# Patient Record
Sex: Female | Born: 1940 | Race: White | Hispanic: No
Health system: Midwestern US, Community
[De-identification: ages and names within clinical notes are randomized; demographics above are authoritative.]

## PROBLEM LIST (undated history)

## (undated) DIAGNOSIS — K219 Gastro-esophageal reflux disease without esophagitis: Secondary | ICD-10-CM

## (undated) DIAGNOSIS — G629 Polyneuropathy, unspecified: Secondary | ICD-10-CM

## (undated) DIAGNOSIS — I10 Essential (primary) hypertension: Secondary | ICD-10-CM

## (undated) DIAGNOSIS — M48061 Spinal stenosis, lumbar region without neurogenic claudication: Secondary | ICD-10-CM

## (undated) DIAGNOSIS — Z8719 Personal history of other diseases of the digestive system: Secondary | ICD-10-CM

## (undated) DIAGNOSIS — E039 Hypothyroidism, unspecified: Secondary | ICD-10-CM

## (undated) DIAGNOSIS — M5416 Radiculopathy, lumbar region: Secondary | ICD-10-CM

## (undated) DIAGNOSIS — F419 Anxiety disorder, unspecified: Secondary | ICD-10-CM

## (undated) DIAGNOSIS — E119 Type 2 diabetes mellitus without complications: Secondary | ICD-10-CM

## (undated) HISTORY — PX: ABDOMINAL HYSTERECTOMY: SHX81

## (undated) HISTORY — PX: LEFT OOPHORECTOMY: SHX1961

## (undated) HISTORY — PX: CHOLECYSTECTOMY: SHX55

## (undated) HISTORY — PX: TONSILLECTOMY: SUR1361

## (undated) HISTORY — PX: BUNIONECTOMY: SHX129

## (undated) HISTORY — DX: Type 2 diabetes mellitus without complications: E11.9

## (undated) HISTORY — PX: BREAST SURGERY: SHX581

## (undated) HISTORY — PX: BACK SURGERY: SHX140

---

## 1999-12-09 DIAGNOSIS — G4733 Obstructive sleep apnea (adult) (pediatric): Secondary | ICD-10-CM | POA: Insufficient documentation

## 1999-12-09 DIAGNOSIS — N189 Chronic kidney disease, unspecified: Secondary | ICD-10-CM | POA: Insufficient documentation

## 1999-12-09 DIAGNOSIS — K760 Fatty (change of) liver, not elsewhere classified: Secondary | ICD-10-CM | POA: Insufficient documentation

## 1999-12-09 DIAGNOSIS — E039 Hypothyroidism, unspecified: Secondary | ICD-10-CM | POA: Insufficient documentation

## 1999-12-09 DIAGNOSIS — E8881 Metabolic syndrome: Secondary | ICD-10-CM | POA: Insufficient documentation

## 2000-10-23 ENCOUNTER — Ambulatory Visit (HOSPITAL_COMMUNITY): Admission: RE | Admit: 2000-10-23 | Discharge: 2000-10-23 | Payer: Self-pay | Admitting: Neurosurgery

## 2000-11-03 ENCOUNTER — Inpatient Hospital Stay (HOSPITAL_COMMUNITY): Admission: RE | Admit: 2000-11-03 | Discharge: 2000-11-05 | Payer: Self-pay | Admitting: Neurosurgery

## 2005-12-10 ENCOUNTER — Inpatient Hospital Stay (HOSPITAL_COMMUNITY): Admission: RE | Admit: 2005-12-10 | Discharge: 2005-12-13 | Payer: Self-pay | Admitting: Neurosurgery

## 2008-03-15 ENCOUNTER — Encounter: Admission: RE | Admit: 2008-03-15 | Discharge: 2008-03-15 | Payer: Self-pay | Admitting: Family Medicine

## 2008-03-31 ENCOUNTER — Encounter: Admission: RE | Admit: 2008-03-31 | Discharge: 2008-03-31 | Payer: Self-pay | Admitting: Family Medicine

## 2010-09-14 ENCOUNTER — Emergency Department (HOSPITAL_BASED_OUTPATIENT_CLINIC_OR_DEPARTMENT_OTHER): Admission: EM | Admit: 2010-09-14 | Discharge: 2010-09-14 | Payer: Self-pay | Admitting: Emergency Medicine

## 2011-02-20 LAB — URINE MICROSCOPIC-ADD ON

## 2011-02-20 LAB — URINALYSIS, ROUTINE W REFLEX MICROSCOPIC
Bilirubin Urine: NEGATIVE
Glucose, UA: NEGATIVE mg/dL
Ketones, ur: NEGATIVE mg/dL
pH: 6 (ref 5.0–8.0)

## 2011-02-20 LAB — URINE CULTURE: Culture  Setup Time: 201110082219

## 2011-04-25 NOTE — Op Note (Signed)
[place]  Patient:    Kathryn Ryan, Kathryn Ryan                      MRN: [id number] Proc. Date: 11/02/00 Adm. Date:  [id number] Attending:  Tressie Stalker D                           Operative Report  BRIEF HISTORY:  The patient is a 70 year old white female who suffered from several years of back and leg pain.  She has failed extensive medical management including medications at the time, therapy, and steroid injections. She was worked up as an outpatient with a lumbar MI and a lumbar ______ CT which demonstrated lumbar spinal stenosis at L3-L4, L4-5 and L5-S1.  She therefore weighed the risks, benefits, and alternatives of surgery and has decided to proceed with lumbar decompression:  PREOPERATIVE DIAGNOSES:  L3-4, L4-5 and L5-S1 spinal stenosis, degenerative disk disease, spondylosis, lumbar radiculopathy.  POSTOPERATIVE DIAGNOSES:  L3-4, L4-5 and L5-S1 spinal stenosis, degenerative disk disease, spondylosis, lumbar radiculopathy.  PROCEDURE:  Bilateral L3, L5 and L5 laminotomy, foraminotomy, using microdissection.  SURGEON:  Cristi Loron, M.D.  ASSISTANT:  Danae Orleans. Venetia Maxon, M.D.  ANESTHESIA:  General endotracheal anesthesia.  ESTIMATED BLOOD LOSS:  200 cc.  SPECIMENS:  None.  DRAINS:  None.  COMPLICATIONS:  None.  DESCRIPTION OF PROCEDURE:  The patient was brought to the operating room by the anesthesia team.  General endotracheal anesthesia was induced. She was then turned to the prone position on the Sussex frame.  her lumbosacral region was then prepared with Betadine scrub and Betadine solution. Sterile drapes were applied.  I then injected the area to be incised with Marcaine with epinephrine solution and used a scalpel to make a midline vertical incision over the L3-4, L4-5 and L5-S1 interspaces.  I used electrocautery to dissect down to the thoracolumbar fascia and divided the fascia bilaterally performing a bilateral  subperiosteal resection, stripping the paraspinous musculature from the spinous process lamina of L3-4, L5 and the upper sacrum.  I inserted a mccullough retractor for exposure.  I then obtained intraoperative radiograph to confirm my location.  I then brought the operating microscope into the field and under magnification and illumination I completed the microdissection/decompression.  I began on the patients left side.  I performed a left L3-4 and L5 laminotomy using the high-speed drill.  I then widened the laminotomy at L3 and L5 with a Kerrison punch removing the L3-4 and L5-S1 ligament of flavum, identified the L4 nerve root, performed a foraminotomy about it as well as the S1 nerve root, performing a foraminotomy about it.  I then performed a left L4 hemi- laminectomy with a Kerrison punch decompressing the thecal sac, removing the L4-5 ligament of flavum and identifying the L5 nerve root, performed a foraminotomy about it.  I used the angled curets to remove some more ligament of flavum from the lateral release.  I got a good decompression of the left L4-5 and S1 nerve root in the thecal sac.  I palpated about these nerves with a long nerve hook and noted they were well decompressed.  I then turned my attention to the right side.  I repeated the procedure on the right.  I used the high-speed drill to perform a right L3-L4 and L5 laminotomy.  I widened the laminotomy at L3 and L5 with a crescent punch removing the L3-4 and L5-S1  ligament of flavum on the right.  I decompressed the thecal sac and identified the right L4 and S1 nerve root and performed a foraminotomy about it.  I then completed the L4 laminectomy with the crescent punch removing the L4-5 ligamentum of flavum on the right.  I performed a complete L4 laminectomy.  I then identified the right L5 nerve root and performed a foraminotomy about it, then used the angled curets to remove some ligamentum of flavum from ______  recess and then palpated about the right L4-5 and S1 nerve root in the thecal sac and noted they were well decompressed.  Of note, at L5-S1 on the right there was a small needle hole in the posterior dura.  This is where the patient had the prior myelogram.  It was a quite small needle hole.  There was leakage of spinal fluid but it was too small to sew.  I laid a couple of pieces of blood soaked Gelfoam over the hole and it provided a good seal.  I then copiously irrigated the wound with bacitracin solution, removed the solution.  I once again palpated about the bilateral L4-5-S1 nerve roots in the thecal sac and noted that the neural structures were well decompressed.  I did not see any more spinal fluid leaking.  I then removed the McCullough retractor and reapproximated the patients thoracolumbar fascia with interrupted #1 Vicryl, the subcutaneous tissue with interrupted 2-0 Vicryl and the skin with Steri-Strips and benzoin.  The wound was then coated with bacitracin ointment and sterile dressings applied.  The drapes were removed and the patient was subsequently returned to the supine position where she was extubated by the anesthesia team and transported to the post anesthesia care unit in stable condition.  All sponge, instrument, and needle counts were correct at the end of this case. DD:  11/02/00 TD:  11/02/00 Job: [id number]

## 2011-04-25 NOTE — Op Note (Signed)
NAME:  Kathryn Ryan, Kathryn Ryan NO.:  [id number]   MEDICAL RECORD NO.:  [id number]          PATIENT TYPE:  INP   LOCATION:  3039                         FACILITY:  [facility]   PHYSICIAN:  Cristi Loron, M.D.DATE OF BIRTH:  01-28-1941   DATE OF PROCEDURE:  12/10/2005  DATE OF DISCHARGE:                                 OPERATIVE REPORT   BRIEF HISTORY:  The patient is a 70 year old white female who has suffered  from back greater than leg pain.  She has previously undergone a  decompressive laminectomy by me several years ago.  She initially did well,  but more recently, has developed recurrent intractable back and leg pain.  She was diagnosed with a neuropathy and was further worked up with a lumbar  MRI which demonstrated a spondylolisthesis at L4-L5.  Flexion extension x-  rays demonstrated instability at L4-L5.  I discussed the various treatments  with the patient including surgery.  The patient weighed the risks,  benefits, and alternatives of surgery and decided to proceed with an L4-L5  decompression and fusion.   PREOPERATIVE DIAGNOSIS:  L4-L5 degenerative disc disease, spondylolisthesis,  spinal stenosis, lumbar radiculopathy, lumbago.   POSTOPERATIVE DIAGNOSIS:  L4-L5 degenerative disc disease,  spondylolisthesis, spinal stenosis, lumbar radiculopathy, lumbago.   PROCEDURE:  Redo L4 laminectomy, L4-L5 transforaminal interbody fusion;  insertion of L4-L5 interbody prosthesis (Capstone PEEK cage 10 by 26 mm);  posterior nonsegmental instrumentation with Legacy titanium pedicle screws  and rods; posterolateral arthrodesis at L4-L5 with local morselized  autograft bone as well as VITOSS bone graft extender.   SURGEON:  Cristi Loron, M.D.   ASSISTANT:  Clydene Fake, M.D.   ANESTHESIA:  General endotracheal anesthesia.   ESTIMATED BLOOD LOSS:  250 mL.   SPECIMENS:  None.   DRAINS:  None.   COMPLICATIONS:  None.   DESCRIPTION OF PROCEDURE:  The  patient was brought to the operating room by  the anesthesia team.  General endotracheal anesthesia was induced.  The  patient was turned to the prone position on the Wilson frame.  Her  lumbosacral  region was then prepared with Betadine scrub and Betadine  solution.  Sterile drapes were applied.  I then injected the area to be  incised with Marcaine with epinephrine solution.  I used a scalpel to make a  linear midline incision through her previous surgical scar over the L4-L5  interspace.  I used electrocautery to perform a subperiosteal dissection  exposing the spinous process and lamina of L4 and L5.  I obtained  interoperative radiographs to confirm our location.  I then inserted the  Versatrac retractor for exposure and used a high speed drill to drill away  some of the epidural fibrosis and to better expose the remainder of the L4  lamina and the L4-L5 facet joint.  I then carefully dissected through the  epidural fibrosis and exposing the underlying dura, I widened the previous  laminotomy with the high speed drill and Kerrison punches.  I did this  exquisitely on the left side.  I then identified the left L4 and L5  nerve  root and performed foraminotomies about them.  I then freed up the thecal  sac from the epidural fibrosis and retracted it medially exposing the  underlying L4-L5 intervertebral disc.  I incised the disc with a 15 blade  scalpel and performed an aggressive discectomy using pituitary forceps and  the Epstein and Scoville curets.  I then used the curets to clear off the  soft tissue from the vertebral endplates at L4-L5 in preparation for the  interbody fusion.  I removed the medial aspect of the left L4-L5 facet joint  to better expose the intervertebral disc space.  I then inserted a 10 by 26  mm Capstone peak cage which had been pre-filled with local autograft bone  and VITOSS into the L4-L5 interspace, of course, after retracting the neural  structures out of  harms way.  I then turned this sideways performing a  transforaminal lumbar interbody fusion.  I then filled posterior to the PEEK  cage with local autograft bone and VITOSS completing the interbody fusion.   I now turned my attention to the posterior instrumentation.  Under  fluoroscopic guidance, I cannulated the bilateral L4 and L5 pedicles with  the bone probe, I taped the pedicles with a 5.5 mm tap, and then inserted  6.5 by 50 mm pedicle screws bilaterally at the ala of L4 and L5.  On the  left side, the screw appeared radiographically to be a little bit low at L5  but I palpated carefully around the L5 pedicle and along the L5 nerve root  and noted there was no cortical breaches and the nerve root was not  compressed in any way.  I, therefore, did not adjust the screw because it  looked good en vivo.  I then connected the screws with a lordotic rod.  We  compressed the construct and secured the rod in place with the caps which  were appropriately tightened completing the instrumentation.   We now turned our attention to the posterolateral arthrodesis.  We used the  high speed drill to decorticate the remainder of the facet joint at L4-L5 on  the right as well as the remainder of the left L4-L5 facet joint and pars  region.  We then laid a combination of local morselized autograft bone and  VITOSS over these to decorticate the posterolateral structures completing  the posterolateral arthrodesis.  We then irrigated the wound out with  Bacitracin solution, removed the solution, inspected the thecal sac and the  left L4 and L5 nerve root and noted it was well decompressed.  We then  obtained hemostasis using electrocautery.  We then removed the Versatrac  retractor and reapproximated the thoracolumbar fascia with interrupted #1  Vicryl suture, the subcutaneous tissue with interrupted 2-0 Vicryl suture, and the skin with Steri-Strips and Benzoin.  The wound was then coated with   Bacitracin ointment.  A sterile dressing was applied, the drapes were  removed.  The patient was subsequently returned to the supine position where  she was extubated by the anesthesia team and transported to the post  anesthesia care unit in stable condition.  All sponge, instrument and needle  counts were correct at the end of the case.      Cristi Loron, M.D.  Electronically Signed     JDJ/MEDQ  D:  12/10/2005  T:  12/10/2005  Job:  [id number]

## 2011-04-25 NOTE — Discharge Summary (Signed)
NAME:  Kathryn Ryan, Kathryn Ryan NO.:  [id number]   MEDICAL RECORD NO.:  [id number]          PATIENT TYPE:  INP   LOCATION:  3039                         FACILITY:  [facility]   PHYSICIAN:  Hewitt Shorts, M.D.DATE OF BIRTH:  1941/11/04   DATE OF ADMISSION:  12/10/2005  DATE OF DISCHARGE:  12/13/2005                                 DISCHARGE SUMMARY   HISTORY OF PRESENT ILLNESS:  The patient is a 70 year old woman, patient of  Dr. Lovell Sheehan, who has had back and leg pain, treated previously with  laminectomy that he did several years ago. She developed worsening back and  leg pain. She had a spondylolisthesis at L4-5 with instability of flexion  and extension, and he was admitted by Dr. Lovell Sheehan for decompression and  arthrodesis. Further details of her admission and physical examination are  included in Dr. Lovell Sheehan' admission note.   HOSPITAL COURSE:  The patient was admitted by Dr. Lovell Sheehan, underwent lumbar  decompression, interbody fusion, and posterolateral fusion. Postoperatively,  she has done well. Her wound is healing well. She is up and ambulating. She  is afebrile. She was given prescriptions by Dr. Lovell Sheehan for Percocet 10/325  one q.4h. p.r.n. pain, prescribed 100 tablets, no refills, and Valium 5 mg  q.6h. p.r.n. muscle spasms, 50 tablets, no refills. She is also to continue  on her usual medications that she had been on prior to hospitalization. She  is to return to see him in two to three weeks for follow-up. She has been  given instructions regarding activities and wound care.   DISCHARGE DIAGNOSIS:  Low back pain with lumbar radicular pain and L4-5  dynamic spondylolisthesis.      Hewitt Shorts, M.D.  Electronically Signed     RWN/MEDQ  D:  12/13/2005  T:  12/13/2005  Job:  [id number]

## 2011-04-25 NOTE — Discharge Summary (Signed)
[place]  Patient:    Kathryn Ryan, Kathryn Ryan                      MRN: [id number] Adm. Date:  [id number] Disc. Date: [id number] Attending:  Tressie Stalker D                           Discharge Summary  For full details of this admission, please refer to the typed history and physical.  HISTORY OF PRESENT ILLNESS:  The patient is a 70 year old white female who suffered from several years of back and leg pain.  She failed extensive medical management including medications, time, physical therapy, and steroid injections.  She was worked up as an outpatient with lumbar MRI and a lumbar myelogram CT which demonstrate lumbar spinal stenosis L3-4, L4-5, and L5-S1. She, therefore, weighed the risks, benefits, and alternatives to surgery and decided to proceed with a lumbar decompression.  For past medical history, past surgical history, medications prior to admission, drug allergies, family medical history, social history, admission physical examination, imaging studies, assessment plan, etc., please refer to the typed history and physical.  HOSPITAL COURSE:  I admitted the patient to [facility] on November 02, 2000, with the diagnosis of L3-4 and L4-5 and L5-S1 spinal stenosis, degenerative disk disease, spinal stenosis, lumbar radiculopathy.  On the day of admission I performed a bilateral L3, L4, and L5 laminotomy and foraminotomy using microdissection.  The surgery went well, without complications (for full details of this operation, please refer to the typed operative note).  At surgery, I did note that the patient was still leaking a small amount of spinal fluid from her lumbar puncture site.  I, therefore, kept her flat postoperatively for one day.  She did have a fever to 101.1 on postoperative day #1.  By postoperative day #2 she was afebrile, her vital signs were stable.  She was eating well and ambulating well, and her fever had  resolved, and she was requesting discharge home.  I, therefore, discharged her home on November 05, 2000.  FOLLOW-UP:  The patient was instructed to follow up with me in four weeks.  DISCHARGE MEDICATIONS: 1. Percocet 5, #60, 1-2 p.o. q.4h. p.r.n. for pain, limit 8 per day, no    refill. 2. Valium 5 mg, #40, 1 p.o. q.6h. p.r.n. for muscle spasms, one refill.  DISCHARGE INSTRUCTIONS:  The patient was instructed to resume her outpatient medical regimen.  FINAL DIAGNOSES:  L3-4, L4-5, and L5-S1 spinal stenosis, degenerative disk disease, spondylosis, lumbar radiculopathy.  PROCEDURES:  Bilateral L3, L4, and L5 laminotomy and foraminotomy using microdissection. DD:  11/19/00 TD:  11/20/00 Job: [id number]

## 2011-04-25 NOTE — H&P (Signed)
[place]  Patient:    Kathryn Ryan, Kathryn Ryan                      MRN: [id number] Adm. Date:  [id number] Attending:  Tressie Stalker D                         History and Physical  CHIEF COMPLAINT:  Back and leg pain.  HISTORY OF PRESENT ILLNESS:  The patient is a 70 year old white female who has "several years" of trouble with her back and legs.  She has been treated extensively with medications and failed to improve.  She has had physical therapy without improvement, and then was sent for lumbar epidural steroid injections and still failed to improve.  She was worked up with a lumber MRI that demonstrated lumbar spinal stenosis.  I confirmed this a lumbar myelographic CT.  The patient complains of some pain in low back, which radiates into her bilateral legs.  She says her legs and feet ache, burn, and tingle, is worse with prolonged standing or walking.  She has failed medical management, as above, has therefore weighed the risks, benefits, alternatives of surgery.  She has decided to proceed with an operation.  PAST MEDICAL HISTORY:  Hypercholesterolemia, remote history of benign breast tumors, cholecystitis, gastroesophageal reflux disease.  PAST SURGICAL HISTORY:  Hysterectomy in 1975, removal of benign breast lumps in 1976, cholecystectomy in 1977, bladder tack-up and left oophorectomy in 1998, gastroesophageal reflux surgery in 1996, bunion surgery in 1996, and bunion surgery in 1997.  MEDICATIONS PRIOR TO ADMISSION: 1. Zantac 150 mg p.o. b.i.d. 2. Premarin 1.25 mg p.o. q.d. 3. Xanax 0;.125 mg p.o. q.d. 4. Multivitamins. 5. Vicoprofen p.r.n.  DRUG ALLERGIES:  SULFA causes itching/pruritus.  FAMILY MEDICAL HISTORY:  Patients mother died age 80 secondary to breast cancer.  Patients father died age 24 secondary to myocardial infarction and diabetes.  SOCIAL HISTORY:  The patient is married.  She has two sons from her first marriage.   She lives in [place].  She denies tobacco, ethanol, or drug use.  REVIEW OF SYSTEMS:  Negative except as above.  PHYSICAL EXAMINATION:  GENERAL:  Obese, pleasant, 70 year old white female complaining of leg pain.  VITAL SIGNS:  Height 5 feet 7 inches, weight 210 pounds.  HEENT:  Normal.  NECK:  Normal.  THORAX:  Symmetric.  LUNGS:  Clear to auscultation.  HEART:  Regular rate and rhythm.  ABDOMEN:  Soft, nontender.  BACK:  No point tenderness or deformities.  Straight leg raise testing and Pearlean Brownie testing negative bilaterally.  EXTREMITIES:  Obese, nontender, no deformities.  NEUROLOGIC:  The patient is alert and oriented x 3.  Cranial nerves 2-12 are grossly intact bilaterally.  Vision and hearing grossly normal bilaterally. Motor strength is 5/5 in her bilateral deltoids, biceps, triceps, hand grips, wrist extensors, interosseous, psoas, quadriceps, gastrocnemius, extensor hallucis longus.  Cerebellar exam is intact to rapid alternating movements of the upper extremities bilaterally.  Sensory exam is normal to light touch and pinprick sensations in all tested dermatomes bilaterally.  Deep tendon reflexes are 2/4 in her bilateral biceps, triceps, brachioradialis, and 1/4 in bilateral quadriceps and gastrocnemius.  She has bilateral flexor plantar reflexes, no active clonus.  LABORATORY:  Patient has a lumbar MRI performed at [facility] January 29, 2000 which demonstrated mild degenerative disease at L2-3 through 4-5 and 5-1.  She has spinal stenosis, worse at L4-5, but  also at L3-4 and L5-S1.  Similar findings were demonstrated on her lumber myelographic CT performed at [facility] October 23, 2000.  ASSESSMENT AND PLAN:  L3-4, L4-5, and L5-S1 degenerative disk disease, spondylosis, spinal stenosis, lumbar radiculopathy.  I have discussed the situation with the patient and husband and reviewed the MRI and myelogram with them, pointed out the  abnormalities.  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  I discussed the various options with her, including doing nothing, continuing medical management, and surgery.  I have described the procedure of a bilateral L3 and L5 laminotomy/foraminotomy with a total laminectomy at L4, decompression of nerves.  I have shown them surgical models.  I have discussed the risks of surgery extensively.  The patient has weighed the risks, benefits, and alternatives of surgery and wants to proceed with the operation on November 02, 2000. DD:  11/02/00 TD:  11/02/00 Job: [id number]

## 2011-12-25 DIAGNOSIS — M961 Postlaminectomy syndrome, not elsewhere classified: Secondary | ICD-10-CM | POA: Diagnosis not present

## 2011-12-25 DIAGNOSIS — IMO0002 Reserved for concepts with insufficient information to code with codable children: Secondary | ICD-10-CM | POA: Diagnosis not present

## 2011-12-25 DIAGNOSIS — G63 Polyneuropathy in diseases classified elsewhere: Secondary | ICD-10-CM | POA: Diagnosis not present

## 2011-12-26 DIAGNOSIS — M5126 Other intervertebral disc displacement, lumbar region: Secondary | ICD-10-CM | POA: Diagnosis not present

## 2011-12-26 DIAGNOSIS — M5137 Other intervertebral disc degeneration, lumbosacral region: Secondary | ICD-10-CM | POA: Diagnosis not present

## 2011-12-26 DIAGNOSIS — M47817 Spondylosis without myelopathy or radiculopathy, lumbosacral region: Secondary | ICD-10-CM | POA: Diagnosis not present

## 2011-12-26 DIAGNOSIS — Z981 Arthrodesis status: Secondary | ICD-10-CM | POA: Diagnosis not present

## 2012-01-19 ENCOUNTER — Other Ambulatory Visit (HOSPITAL_COMMUNITY): Payer: Self-pay | Admitting: Neurosurgery

## 2012-01-19 ENCOUNTER — Other Ambulatory Visit: Payer: Self-pay | Admitting: Neurosurgery

## 2012-01-19 DIAGNOSIS — M545 Low back pain: Secondary | ICD-10-CM

## 2012-01-22 ENCOUNTER — Other Ambulatory Visit: Payer: Self-pay | Admitting: Neurosurgery

## 2012-01-22 DIAGNOSIS — M549 Dorsalgia, unspecified: Secondary | ICD-10-CM

## 2012-01-23 ENCOUNTER — Ambulatory Visit
Admission: RE | Admit: 2012-01-23 | Discharge: 2012-01-23 | Disposition: A | Discharge status: Home / Self Care | Payer: Medicare Other | Source: Ambulatory Visit | Attending: Neurosurgery | Admitting: Neurosurgery

## 2012-01-23 DIAGNOSIS — M549 Dorsalgia, unspecified: Secondary | ICD-10-CM

## 2012-01-23 DIAGNOSIS — M5137 Other intervertebral disc degeneration, lumbosacral region: Secondary | ICD-10-CM | POA: Diagnosis not present

## 2012-01-23 DIAGNOSIS — M5126 Other intervertebral disc displacement, lumbar region: Secondary | ICD-10-CM | POA: Diagnosis not present

## 2012-01-23 DIAGNOSIS — M47817 Spondylosis without myelopathy or radiculopathy, lumbosacral region: Secondary | ICD-10-CM | POA: Diagnosis not present

## 2012-01-23 MED ORDER — ONDANSETRON HCL 4 MG/2ML IJ SOLN: 4.0000 mg | Freq: Four times a day (QID) | INTRAMUSCULAR | Status: DC | PRN

## 2012-01-23 MED ORDER — ONDANSETRON HCL 4 MG/2ML IJ SOLN
4.0000 mg | Freq: Once | INTRAMUSCULAR | Status: AC
  Administered 2012-01-23: 4 mg via INTRAMUSCULAR

## 2012-01-23 MED ORDER — DIAZEPAM 5 MG PO TABS
5.0000 mg | ORAL_TABLET | Freq: Once | ORAL | Status: AC
  Administered 2012-01-23: 5 mg via ORAL

## 2012-01-23 MED ORDER — MEPERIDINE HCL 100 MG/ML IJ SOLN
75.0000 mg | Freq: Once | INTRAMUSCULAR | Status: AC
  Administered 2012-01-23: 75 mg via INTRAMUSCULAR

## 2012-01-23 NOTE — Discharge Instructions (Signed)

## 2012-02-03 DIAGNOSIS — K5900 Constipation, unspecified: Secondary | ICD-10-CM | POA: Diagnosis not present

## 2012-02-03 DIAGNOSIS — I10 Essential (primary) hypertension: Secondary | ICD-10-CM | POA: Diagnosis not present

## 2012-02-03 DIAGNOSIS — G619 Inflammatory polyneuropathy, unspecified: Secondary | ICD-10-CM | POA: Diagnosis not present

## 2012-02-03 DIAGNOSIS — M545 Low back pain: Secondary | ICD-10-CM | POA: Diagnosis not present

## 2012-02-03 DIAGNOSIS — E669 Obesity, unspecified: Secondary | ICD-10-CM | POA: Diagnosis not present

## 2012-02-03 DIAGNOSIS — E782 Mixed hyperlipidemia: Secondary | ICD-10-CM | POA: Diagnosis not present

## 2012-02-03 DIAGNOSIS — IMO0002 Reserved for concepts with insufficient information to code with codable children: Secondary | ICD-10-CM | POA: Diagnosis not present

## 2012-02-04 DIAGNOSIS — M7520 Bicipital tendinitis, unspecified shoulder: Secondary | ICD-10-CM | POA: Diagnosis not present

## 2012-02-06 ENCOUNTER — Other Ambulatory Visit: Payer: Self-pay | Admitting: Neurosurgery

## 2012-02-10 ENCOUNTER — Other Ambulatory Visit (HOSPITAL_COMMUNITY): Payer: Self-pay

## 2012-02-16 ENCOUNTER — Encounter (HOSPITAL_COMMUNITY): Payer: Self-pay | Admitting: Pharmacy Technician

## 2012-02-18 ENCOUNTER — Inpatient Hospital Stay (HOSPITAL_COMMUNITY): Admission: RE | Admit: 2012-02-18 | Discharge: 2012-02-18 | Payer: Medicare Other | Source: Ambulatory Visit

## 2012-02-18 MED ORDER — DIAZEPAM 5 MG PO TABS: 10.0000 mg | ORAL_TABLET | Freq: Once | ORAL | Status: DC

## 2012-02-23 ENCOUNTER — Other Ambulatory Visit: Payer: Self-pay

## 2012-02-23 ENCOUNTER — Encounter (HOSPITAL_COMMUNITY)
Admission: RE | Admit: 2012-02-23 | Discharge: 2012-02-23 | Disposition: A | Discharge status: Home / Self Care | Payer: Medicare Other | Source: Ambulatory Visit | Attending: Neurosurgery | Admitting: Neurosurgery

## 2012-02-23 ENCOUNTER — Encounter (HOSPITAL_COMMUNITY): Payer: Self-pay

## 2012-02-23 ENCOUNTER — Ambulatory Visit (HOSPITAL_COMMUNITY)
Admission: RE | Admit: 2012-02-23 | Discharge: 2012-02-23 | Disposition: A | Discharge status: Home / Self Care | Payer: Medicare Other | Source: Ambulatory Visit | Attending: Anesthesiology | Admitting: Anesthesiology

## 2012-02-23 DIAGNOSIS — Z01811 Encounter for preprocedural respiratory examination: Secondary | ICD-10-CM | POA: Diagnosis not present

## 2012-02-23 DIAGNOSIS — Z01818 Encounter for other preprocedural examination: Secondary | ICD-10-CM | POA: Diagnosis not present

## 2012-02-23 HISTORY — DX: Personal history of other diseases of the digestive system: Z87.19

## 2012-02-23 HISTORY — DX: Hypothyroidism, unspecified: E03.9

## 2012-02-23 HISTORY — DX: Polyneuropathy, unspecified: G62.9

## 2012-02-23 HISTORY — DX: Essential (primary) hypertension: I10

## 2012-02-23 HISTORY — DX: Gastro-esophageal reflux disease without esophagitis: K21.9

## 2012-02-23 LAB — CBC
HCT: 38.5 % (ref 36.0–46.0)
Hemoglobin: 13.1 g/dL (ref 12.0–15.0)
MCH: 32.5 pg (ref 26.0–34.0)
MCV: 95.5 fL (ref 78.0–100.0)
RBC: 4.03 MIL/uL (ref 3.87–5.11)

## 2012-02-23 LAB — BASIC METABOLIC PANEL
CO2: 31 mEq/L (ref 19–32)
Glucose, Bld: 122 mg/dL — ABNORMAL HIGH (ref 70–99)
Potassium: 3.6 mEq/L (ref 3.5–5.1)
Sodium: 139 mEq/L (ref 135–145)

## 2012-02-23 LAB — SURGICAL PCR SCREEN: MRSA, PCR: NEGATIVE

## 2012-02-23 NOTE — Pre-Procedure Instructions (Signed)
20 Kathryn Ryan  02/23/2012   Your procedure is scheduled on:  02/26/12  Report to [facility] at 5:30 AM.  Call this number if you have problems the morning of surgery: [phone]   Remember: Discontinue Aspirin, Coumadin, Plavix, Effient and herbal medications.   Do not eat food:After Midnight.  May have clear liquids: up to 4 Hours before arrival 1:30 AM).  Clear liquids include soda, tea, black coffee, apple or grape juice, broth.  Take these medicines the morning of surgery with A SIP OF WATER: Pristiq, Nexium, Premarin, Neurontin, Vicodin, Levothyroid   Do not wear jewelry, make-up or nail polish.  Do not wear lotions, powders, or perfumes. You may wear deodorant.  Do not shave 48 hours prior to surgery.  Do not bring valuables to the hospital.  Contacts, dentures or bridgework may not be worn into surgery.  Leave suitcase in the car. After surgery it may be brought to your room.  For patients admitted to the hospital, checkout time is 11:00 AM the day of discharge.   Patients discharged the day of surgery will not be allowed to drive home.  Name and phone number of your driver: Being admitted.  Special Instructions: CHG Shower Use Special Wash: 1/2 bottle night before surgery and 1/2 bottle morning of surgery.   Please read over the following fact sheets that you were given: Pain Booklet, Coughing and Deep Breathing, Blood Transfusion Information, MRSA Information and Surgical Site Infection Prevention

## 2012-02-25 MED ORDER — CEFAZOLIN SODIUM-DEXTROSE 2-3 GM-% IV SOLR
2.0000 g | INTRAVENOUS | Status: AC
  Administered 2012-02-26: 2 g via INTRAVENOUS
  Filled 2012-02-25: qty 50

## 2012-02-26 ENCOUNTER — Inpatient Hospital Stay (HOSPITAL_COMMUNITY)
Admission: RE | Admit: 2012-02-26 | Discharge: 2012-03-03 | DRG: 460 | Disposition: A | Discharge status: Skilled Nursing Facility | Payer: Medicare Other | Source: Ambulatory Visit | Attending: Neurosurgery | Admitting: Neurosurgery

## 2012-02-26 ENCOUNTER — Inpatient Hospital Stay (HOSPITAL_COMMUNITY): Payer: Medicare Other

## 2012-02-26 ENCOUNTER — Encounter (HOSPITAL_COMMUNITY)
Admission: RE | Disposition: A | Discharge status: Skilled Nursing Facility | Payer: Self-pay | Source: Ambulatory Visit | Attending: Neurosurgery

## 2012-02-26 ENCOUNTER — Encounter (HOSPITAL_COMMUNITY): Payer: Self-pay | Admitting: Anesthesiology

## 2012-02-26 ENCOUNTER — Inpatient Hospital Stay (HOSPITAL_COMMUNITY): Payer: Medicare Other | Admitting: Anesthesiology

## 2012-02-26 DIAGNOSIS — I10 Essential (primary) hypertension: Secondary | ICD-10-CM | POA: Diagnosis not present

## 2012-02-26 DIAGNOSIS — IMO0001 Reserved for inherently not codable concepts without codable children: Secondary | ICD-10-CM | POA: Diagnosis not present

## 2012-02-26 DIAGNOSIS — M48061 Spinal stenosis, lumbar region without neurogenic claudication: Secondary | ICD-10-CM | POA: Diagnosis not present

## 2012-02-26 DIAGNOSIS — K2100 Gastro-esophageal reflux disease with esophagitis, without bleeding: Secondary | ICD-10-CM | POA: Diagnosis not present

## 2012-02-26 DIAGNOSIS — G2581 Restless legs syndrome: Secondary | ICD-10-CM | POA: Diagnosis not present

## 2012-02-26 DIAGNOSIS — G589 Mononeuropathy, unspecified: Secondary | ICD-10-CM | POA: Diagnosis present

## 2012-02-26 DIAGNOSIS — M51379 Other intervertebral disc degeneration, lumbosacral region without mention of lumbar back pain or lower extremity pain: Secondary | ICD-10-CM | POA: Diagnosis present

## 2012-02-26 DIAGNOSIS — Z882 Allergy status to sulfonamides status: Secondary | ICD-10-CM

## 2012-02-26 DIAGNOSIS — R262 Difficulty in walking, not elsewhere classified: Secondary | ICD-10-CM | POA: Diagnosis not present

## 2012-02-26 DIAGNOSIS — K219 Gastro-esophageal reflux disease without esophagitis: Secondary | ICD-10-CM | POA: Diagnosis not present

## 2012-02-26 DIAGNOSIS — E039 Hypothyroidism, unspecified: Secondary | ICD-10-CM | POA: Diagnosis present

## 2012-02-26 DIAGNOSIS — M545 Low back pain, unspecified: Secondary | ICD-10-CM | POA: Diagnosis not present

## 2012-02-26 DIAGNOSIS — F329 Major depressive disorder, single episode, unspecified: Secondary | ICD-10-CM | POA: Diagnosis not present

## 2012-02-26 DIAGNOSIS — IMO0002 Reserved for concepts with insufficient information to code with codable children: Secondary | ICD-10-CM | POA: Diagnosis not present

## 2012-02-26 DIAGNOSIS — M5137 Other intervertebral disc degeneration, lumbosacral region: Principal | ICD-10-CM | POA: Diagnosis present

## 2012-02-26 DIAGNOSIS — M4800 Spinal stenosis, site unspecified: Secondary | ICD-10-CM | POA: Diagnosis not present

## 2012-02-26 DIAGNOSIS — Z90710 Acquired absence of both cervix and uterus: Secondary | ICD-10-CM

## 2012-02-26 DIAGNOSIS — M5116 Intervertebral disc disorders with radiculopathy, lumbar region: Secondary | ICD-10-CM

## 2012-02-26 DIAGNOSIS — M519 Unspecified thoracic, thoracolumbar and lumbosacral intervertebral disc disorder: Secondary | ICD-10-CM | POA: Diagnosis not present

## 2012-02-26 DIAGNOSIS — M549 Dorsalgia, unspecified: Secondary | ICD-10-CM | POA: Diagnosis not present

## 2012-02-26 DIAGNOSIS — Z87891 Personal history of nicotine dependence: Secondary | ICD-10-CM | POA: Diagnosis not present

## 2012-02-26 DIAGNOSIS — K5900 Constipation, unspecified: Secondary | ICD-10-CM | POA: Diagnosis not present

## 2012-02-26 DIAGNOSIS — M6281 Muscle weakness (generalized): Secondary | ICD-10-CM | POA: Diagnosis not present

## 2012-02-26 SURGERY — POSTERIOR LUMBAR FUSION 1 WITH HARDWARE REMOVAL
Anesthesia: General | Site: Spine Lumbar | Wound class: Clean

## 2012-02-26 MED ORDER — HYDROCHLOROTHIAZIDE 25 MG PO TABS
25.0000 mg | ORAL_TABLET | Freq: Every day | ORAL | Status: DC
  Administered 2012-02-26 – 2012-03-03 (×7): 25 mg via ORAL
  Filled 2012-02-26 (×8): qty 1

## 2012-02-26 MED ORDER — HETASTARCH-ELECTROLYTES 6 % IV SOLN
INTRAVENOUS | Status: DC | PRN
  Administered 2012-02-26: 10:00:00 via INTRAVENOUS

## 2012-02-26 MED ORDER — BACITRACIN ZINC 500 UNIT/GM EX OINT
TOPICAL_OINTMENT | CUTANEOUS | Status: DC | PRN
  Administered 2012-02-26: 1 via TOPICAL

## 2012-02-26 MED ORDER — ROCURONIUM BROMIDE 100 MG/10ML IV SOLN
INTRAVENOUS | Status: DC | PRN
  Administered 2012-02-26 (×2): 50 mg via INTRAVENOUS

## 2012-02-26 MED ORDER — LACTATED RINGERS IV SOLN
INTRAVENOUS | Status: DC
  Administered 2012-02-27: 05:00:00 via INTRAVENOUS

## 2012-02-26 MED ORDER — FENTANYL CITRATE 0.05 MG/ML IJ SOLN
INTRAMUSCULAR | Status: DC | PRN
  Administered 2012-02-26: 75 ug via INTRAVENOUS
  Administered 2012-02-26 (×3): 50 ug via INTRAVENOUS
  Administered 2012-02-26: 100 ug via INTRAVENOUS
  Administered 2012-02-26: 50 ug via INTRAVENOUS

## 2012-02-26 MED ORDER — PHENYLEPHRINE HCL 10 MG/ML IJ SOLN
INTRAMUSCULAR | Status: DC | PRN
  Administered 2012-02-26 (×3): 80 ug via INTRAVENOUS
  Administered 2012-02-26: 40 ug via INTRAVENOUS
  Administered 2012-02-26 (×4): 80 ug via INTRAVENOUS

## 2012-02-26 MED ORDER — HYDROMORPHONE HCL PF 1 MG/ML IJ SOLN
INTRAMUSCULAR | Status: AC
  Filled 2012-02-26: qty 1

## 2012-02-26 MED ORDER — DEXAMETHASONE SODIUM PHOSPHATE 4 MG/ML IJ SOLN
INTRAMUSCULAR | Status: DC | PRN
  Administered 2012-02-26: 8 mg via INTRAVENOUS

## 2012-02-26 MED ORDER — ONDANSETRON HCL 4 MG/2ML IJ SOLN
INTRAMUSCULAR | Status: DC | PRN
  Administered 2012-02-26 (×2): 4 mg via INTRAVENOUS

## 2012-02-26 MED ORDER — OXYCODONE-ACETAMINOPHEN 5-325 MG PO TABS
1.0000 | ORAL_TABLET | ORAL | Status: DC | PRN
  Administered 2012-02-27 – 2012-03-01 (×2): 2 via ORAL
  Filled 2012-02-26 (×4): qty 2

## 2012-02-26 MED ORDER — PANTOPRAZOLE SODIUM 40 MG PO TBEC
80.0000 mg | DELAYED_RELEASE_TABLET | Freq: Every day | ORAL | Status: DC
  Administered 2012-02-27 – 2012-03-03 (×6): 80 mg via ORAL
  Filled 2012-02-26 (×2): qty 1
  Filled 2012-02-26 (×3): qty 2

## 2012-02-26 MED ORDER — LEVOTHYROXINE SODIUM 50 MCG PO TABS
50.0000 ug | ORAL_TABLET | Freq: Every day | ORAL | Status: DC
  Administered 2012-02-27 – 2012-03-03 (×6): 50 ug via ORAL
  Filled 2012-02-26 (×9): qty 1

## 2012-02-26 MED ORDER — MENTHOL 3 MG MT LOZG: 1.0000 | LOZENGE | OROMUCOSAL | Status: DC | PRN

## 2012-02-26 MED ORDER — VENLAFAXINE HCL ER 75 MG PO CP24
75.0000 mg | ORAL_CAPSULE | Freq: Every day | ORAL | Status: DC
  Administered 2012-02-28 – 2012-03-03 (×5): 75 mg via ORAL
  Filled 2012-02-26 (×9): qty 1

## 2012-02-26 MED ORDER — MORPHINE SULFATE (PF) 1 MG/ML IV SOLN
INTRAVENOUS | Status: AC
  Filled 2012-02-26: qty 25

## 2012-02-26 MED ORDER — PHENOL 1.4 % MT LIQD: 1.0000 | OROMUCOSAL | Status: DC | PRN

## 2012-02-26 MED ORDER — SODIUM CHLORIDE 0.9 % IV SOLN
10.0000 mg | INTRAVENOUS | Status: DC | PRN
  Administered 2012-02-26: 1 ug/min via INTRAVENOUS

## 2012-02-26 MED ORDER — GLYCOPYRROLATE 0.2 MG/ML IJ SOLN
INTRAMUSCULAR | Status: DC | PRN
  Administered 2012-02-26: .6 mg via INTRAVENOUS

## 2012-02-26 MED ORDER — DIPHENHYDRAMINE HCL 50 MG/ML IJ SOLN: 12.5000 mg | Freq: Four times a day (QID) | INTRAMUSCULAR | Status: DC | PRN

## 2012-02-26 MED ORDER — BUPIVACAINE-EPINEPHRINE PF 0.5-1:200000 % IJ SOLN
INTRAMUSCULAR | Status: DC | PRN
  Administered 2012-02-26: 10 mL

## 2012-02-26 MED ORDER — ONDANSETRON HCL 4 MG/2ML IJ SOLN
4.0000 mg | INTRAMUSCULAR | Status: DC | PRN
  Administered 2012-02-27: 4 mg via INTRAVENOUS
  Filled 2012-02-26: qty 2

## 2012-02-26 MED ORDER — DIPHENHYDRAMINE HCL 12.5 MG/5ML PO ELIX: 12.5000 mg | ORAL_SOLUTION | Freq: Four times a day (QID) | ORAL | Status: DC | PRN

## 2012-02-26 MED ORDER — SODIUM CHLORIDE 0.9 % IJ SOLN: 9.0000 mL | INTRAMUSCULAR | Status: DC | PRN

## 2012-02-26 MED ORDER — LIDOCAINE HCL (CARDIAC) 20 MG/ML IV SOLN
INTRAVENOUS | Status: DC | PRN
  Administered 2012-02-26: 40 mg via INTRAVENOUS

## 2012-02-26 MED ORDER — DROPERIDOL 2.5 MG/ML IJ SOLN
INTRAMUSCULAR | Status: DC | PRN
  Administered 2012-02-26: 0.625 mg via INTRAVENOUS

## 2012-02-26 MED ORDER — ONDANSETRON HCL 4 MG/2ML IJ SOLN
4.0000 mg | Freq: Four times a day (QID) | INTRAMUSCULAR | Status: DC | PRN
Start: 2012-02-26 — End: 2012-02-26

## 2012-02-26 MED ORDER — SODIUM CHLORIDE 0.9 % IR SOLN
Status: DC | PRN
  Administered 2012-02-26: 09:00:00

## 2012-02-26 MED ORDER — GABAPENTIN 400 MG PO CAPS
800.0000 mg | ORAL_CAPSULE | Freq: Four times a day (QID) | ORAL | Status: DC
  Administered 2012-02-26 – 2012-03-03 (×20): 800 mg via ORAL
  Filled 2012-02-26 (×27): qty 2

## 2012-02-26 MED ORDER — CEFAZOLIN SODIUM 1-5 GM-% IV SOLN
1.0000 g | Freq: Three times a day (TID) | INTRAVENOUS | Status: AC
  Administered 2012-02-26 – 2012-02-27 (×2): 1 g via INTRAVENOUS
  Filled 2012-02-26 (×2): qty 50

## 2012-02-26 MED ORDER — ZOLPIDEM TARTRATE 5 MG PO TABS: 5.0000 mg | ORAL_TABLET | Freq: Every evening | ORAL | Status: DC | PRN

## 2012-02-26 MED ORDER — DIAZEPAM 5 MG PO TABS
5.0000 mg | ORAL_TABLET | Freq: Four times a day (QID) | ORAL | Status: DC | PRN
  Administered 2012-02-26: 2.5 mg via ORAL
  Administered 2012-02-29 – 2012-03-02 (×6): 5 mg via ORAL
  Filled 2012-02-26 (×7): qty 1

## 2012-02-26 MED ORDER — MORPHINE SULFATE (PF) 1 MG/ML IV SOLN
INTRAVENOUS | Status: DC
  Administered 2012-02-26: 12:00:00 via INTRAVENOUS

## 2012-02-26 MED ORDER — ADULT MULTIVITAMIN W/MINERALS CH
1.0000 | ORAL_TABLET | Freq: Every day | ORAL | Status: DC
  Administered 2012-02-27 – 2012-03-03 (×6): 1 via ORAL
  Filled 2012-02-26 (×7): qty 1

## 2012-02-26 MED ORDER — SODIUM CHLORIDE 0.9 % IV SOLN
INTRAVENOUS | Status: AC
  Filled 2012-02-26: qty 500

## 2012-02-26 MED ORDER — NEOSTIGMINE METHYLSULFATE 1 MG/ML IJ SOLN
INTRAMUSCULAR | Status: DC | PRN
  Administered 2012-02-26: 4 mg via INTRAVENOUS

## 2012-02-26 MED ORDER — ONDANSETRON HCL 4 MG/2ML IJ SOLN: 4.0000 mg | Freq: Four times a day (QID) | INTRAMUSCULAR | Status: DC | PRN

## 2012-02-26 MED ORDER — HYDROMORPHONE HCL PF 1 MG/ML IJ SOLN
0.2500 mg | INTRAMUSCULAR | Status: DC | PRN
  Administered 2012-02-26 (×2): 0.5 mg via INTRAVENOUS

## 2012-02-26 MED ORDER — HYDROCODONE-ACETAMINOPHEN 5-325 MG PO TABS
1.0000 | ORAL_TABLET | ORAL | Status: DC | PRN
  Administered 2012-02-27: 2 via ORAL
  Administered 2012-02-28: 1 via ORAL
  Administered 2012-02-29: 2 via ORAL
  Administered 2012-02-29: 1 via ORAL
  Administered 2012-02-29 – 2012-03-02 (×7): 2 via ORAL
  Administered 2012-03-03: 1 via ORAL
  Filled 2012-02-26 (×6): qty 2
  Filled 2012-02-26: qty 1
  Filled 2012-02-26 (×5): qty 2

## 2012-02-26 MED ORDER — FENTANYL 50 MCG/HR TD PT72: 100.0000 ug | MEDICATED_PATCH | TRANSDERMAL | Status: DC

## 2012-02-26 MED ORDER — GABAPENTIN 800 MG PO TABS
800.0000 mg | ORAL_TABLET | Freq: Four times a day (QID) | ORAL | Status: DC
Start: 2012-02-26 — End: 2012-02-26
  Filled 2012-02-26 (×2): qty 1

## 2012-02-26 MED ORDER — ACETAMINOPHEN 650 MG RE SUPP: 650.0000 mg | RECTAL | Status: DC | PRN

## 2012-02-26 MED ORDER — NALOXONE HCL 0.4 MG/ML IJ SOLN: 0.4000 mg | INTRAMUSCULAR | Status: DC | PRN

## 2012-02-26 MED ORDER — ESTROGENS CONJUGATED 1.25 MG PO TABS
1.2500 mg | ORAL_TABLET | Freq: Every day | ORAL | Status: DC
  Administered 2012-02-26 – 2012-03-03 (×7): 1.25 mg via ORAL
  Filled 2012-02-26 (×8): qty 1

## 2012-02-26 MED ORDER — LACTATED RINGERS IV SOLN
INTRAVENOUS | Status: DC | PRN
  Administered 2012-02-26 (×3): via INTRAVENOUS

## 2012-02-26 MED ORDER — MIDAZOLAM HCL 5 MG/5ML IJ SOLN
INTRAMUSCULAR | Status: DC | PRN
  Administered 2012-02-26: 2 mg via INTRAVENOUS

## 2012-02-26 MED ORDER — ACETAMINOPHEN 325 MG PO TABS
650.0000 mg | ORAL_TABLET | ORAL | Status: DC | PRN
  Administered 2012-02-28 – 2012-03-01 (×2): 650 mg via ORAL
  Filled 2012-02-26 (×2): qty 2

## 2012-02-26 MED ORDER — LISINOPRIL-HYDROCHLOROTHIAZIDE 20-25 MG PO TABS: 1.0000 | ORAL_TABLET | Freq: Every day | ORAL | Status: DC

## 2012-02-26 MED ORDER — DOCUSATE SODIUM 100 MG PO CAPS
100.0000 mg | ORAL_CAPSULE | Freq: Two times a day (BID) | ORAL | Status: DC
  Administered 2012-02-26 – 2012-03-03 (×12): 100 mg via ORAL
  Filled 2012-02-26 (×10): qty 1

## 2012-02-26 MED ORDER — 0.9 % SODIUM CHLORIDE (POUR BTL) OPTIME
TOPICAL | Status: DC | PRN
  Administered 2012-02-26: 1000 mL

## 2012-02-26 MED ORDER — BACITRACIN 50000 UNITS IM SOLR
INTRAMUSCULAR | Status: AC
  Filled 2012-02-26: qty 1

## 2012-02-26 MED ORDER — PROPOFOL 10 MG/ML IV EMUL
INTRAVENOUS | Status: DC | PRN
  Administered 2012-02-26: 160 mg via INTRAVENOUS

## 2012-02-26 MED ORDER — MORPHINE SULFATE (PF) 1 MG/ML IV SOLN
INTRAVENOUS | Status: AC
  Administered 2012-02-26: 20:00:00
  Filled 2012-02-26: qty 25

## 2012-02-26 MED ORDER — VECURONIUM BROMIDE 10 MG IV SOLR
INTRAVENOUS | Status: DC | PRN
  Administered 2012-02-26: 3 mg via INTRAVENOUS
  Administered 2012-02-26: 2 mg via INTRAVENOUS

## 2012-02-26 MED ORDER — LISINOPRIL 20 MG PO TABS
20.0000 mg | ORAL_TABLET | Freq: Every day | ORAL | Status: DC
  Administered 2012-02-26 – 2012-03-03 (×7): 20 mg via ORAL
  Filled 2012-02-26 (×9): qty 1

## 2012-02-26 MED ORDER — THROMBIN 20000 UNITS EX KIT
PACK | CUTANEOUS | Status: DC | PRN
  Administered 2012-02-26: 09:00:00 via TOPICAL

## 2012-02-26 SURGICAL SUPPLY — 67 items
APL SKNCLS STERI-STRIP NONHPOA (GAUZE/BANDAGES/DRESSINGS) ×1
BAG DECANTER FOR FLEXI CONT (MISCELLANEOUS) ×2 IMPLANT
BENZOIN TINCTURE PRP APPL 2/3 (GAUZE/BANDAGES/DRESSINGS) ×2 IMPLANT
BLADE SURG ROTATE 9660 (MISCELLANEOUS) IMPLANT
BRUSH SCRUB EZ PLAIN DRY (MISCELLANEOUS) ×2 IMPLANT
BUR ACORN 6.0 (BURR) ×2 IMPLANT
BUR MATCHSTICK NEURO 3.0 LAGG (BURR) ×2 IMPLANT
CANISTER SUCTION 2500CC (MISCELLANEOUS) ×2 IMPLANT
CLOTH BEACON ORANGE TIMEOUT ST (SAFETY) ×2 IMPLANT
CONT SPEC 4OZ CLIKSEAL STRL BL (MISCELLANEOUS) ×2 IMPLANT
COVER BACK TABLE 24X17X13 BIG (DRAPES) IMPLANT
DRAPE C-ARM 42X72 X-RAY (DRAPES) ×4 IMPLANT
DRAPE LAPAROTOMY 100X72X124 (DRAPES) ×2 IMPLANT
DRAPE POUCH INSTRU U-SHP 10X18 (DRAPES) ×2 IMPLANT
DRAPE SURG 17X23 STRL (DRAPES) ×8 IMPLANT
ELECT BLADE 4.0 EZ CLEAN MEGAD (MISCELLANEOUS) ×2
ELECT REM PT RETURN 9FT ADLT (ELECTROSURGICAL) ×2
ELECTRODE BLDE 4.0 EZ CLN MEGD (MISCELLANEOUS) ×1 IMPLANT
ELECTRODE REM PT RTRN 9FT ADLT (ELECTROSURGICAL) ×1 IMPLANT
GAUZE SPONGE 4X4 16PLY XRAY LF (GAUZE/BANDAGES/DRESSINGS) ×1 IMPLANT
GLOVE BIO SURGEON STRL SZ8.5 (GLOVE) ×4 IMPLANT
GLOVE BIOGEL PI IND STRL 7.0 (GLOVE) IMPLANT
GLOVE BIOGEL PI IND STRL 8 (GLOVE) IMPLANT
GLOVE BIOGEL PI INDICATOR 7.0 (GLOVE) ×2
GLOVE BIOGEL PI INDICATOR 8 (GLOVE) ×1
GLOVE ECLIPSE 7.5 STRL STRAW (GLOVE) ×1 IMPLANT
GLOVE EXAM NITRILE LRG STRL (GLOVE) IMPLANT
GLOVE EXAM NITRILE MD LF STRL (GLOVE) IMPLANT
GLOVE EXAM NITRILE XL STR (GLOVE) IMPLANT
GLOVE EXAM NITRILE XS STR PU (GLOVE) IMPLANT
GLOVE SS BIOGEL STRL SZ 8 (GLOVE) ×2 IMPLANT
GLOVE SUPERSENSE BIOGEL SZ 8 (GLOVE) ×2
GLOVE SURG SS PI 6.5 STRL IVOR (GLOVE) ×2 IMPLANT
GOWN BRE IMP SLV AUR LG STRL (GOWN DISPOSABLE) ×1 IMPLANT
GOWN BRE IMP SLV AUR XL STRL (GOWN DISPOSABLE) ×4 IMPLANT
GOWN STRL REIN 2XL LVL4 (GOWN DISPOSABLE) ×1 IMPLANT
KIT BASIN OR (CUSTOM PROCEDURE TRAY) ×2 IMPLANT
KIT ROOM TURNOVER OR (KITS) ×2 IMPLANT
NDL HYPO 21X1.5 SAFETY (NEEDLE) IMPLANT
NEEDLE HYPO 21X1.5 SAFETY (NEEDLE) IMPLANT
NEEDLE HYPO 22GX1.5 SAFETY (NEEDLE) ×2 IMPLANT
NS IRRIG 1000ML POUR BTL (IV SOLUTION) ×2 IMPLANT
PACK FOAM VITOSS 10CC (Orthopedic Implant) ×1 IMPLANT
PACK LAMINECTOMY NEURO (CUSTOM PROCEDURE TRAY) ×2 IMPLANT
PAD ARMBOARD 7.5X6 YLW CONV (MISCELLANEOUS) ×8 IMPLANT
PATTIES SURGICAL .5 X1 (DISPOSABLE) IMPLANT
PATTIES SURGICAL 1X1 (DISPOSABLE) ×1 IMPLANT
PUTTY 10ML ACTIFUSE ABX (Putty) ×1 IMPLANT
ROD PREBENT 6.35X60 (Rod) ×2 IMPLANT
SCREW PEDICLE VA L635 7.5X50M (Screw) ×2 IMPLANT
SCREW SET BREAK OFF (Screw) ×6 IMPLANT
SPACER SUSTAIN O 10X10X26 (Screw) ×2 IMPLANT
SPONGE GAUZE 4X4 12PLY (GAUZE/BANDAGES/DRESSINGS) ×2 IMPLANT
SPONGE LAP 4X18 X RAY DECT (DISPOSABLE) IMPLANT
SPONGE NEURO XRAY DETECT 1X3 (DISPOSABLE) IMPLANT
SPONGE SURGIFOAM ABS GEL 100 (HEMOSTASIS) ×2 IMPLANT
STRIP CLOSURE SKIN 1/2X4 (GAUZE/BANDAGES/DRESSINGS) ×2 IMPLANT
SUT VIC AB 1 CT1 18XBRD ANBCTR (SUTURE) ×2 IMPLANT
SUT VIC AB 1 CT1 8-18 (SUTURE) ×4
SUT VIC AB 2-0 CP2 18 (SUTURE) ×5 IMPLANT
SYR 20CC LL (SYRINGE) IMPLANT
SYR 20ML ECCENTRIC (SYRINGE) ×2 IMPLANT
TAPE CLOTH SURG 4X10 WHT LF (GAUZE/BANDAGES/DRESSINGS) ×1 IMPLANT
TOWEL OR 17X24 6PK STRL BLUE (TOWEL DISPOSABLE) ×2 IMPLANT
TOWEL OR 17X26 10 PK STRL BLUE (TOWEL DISPOSABLE) ×2 IMPLANT
TRAY FOLEY CATH 14FRSI W/METER (CATHETERS) ×2 IMPLANT
WATER STERILE IRR 1000ML POUR (IV SOLUTION) ×2 IMPLANT

## 2012-02-26 NOTE — Anesthesia Preprocedure Evaluation (Signed)
Anesthesia Evaluation  Patient identified by MRN, date of birth, ID band Patient awake    Reviewed: Allergy & Precautions, H&P , NPO status , Patient's Chart, lab work & pertinent test results  Airway Mallampati: II  Neck ROM: full    Dental   Pulmonary former smoker         Cardiovascular hypertension,     Neuro/Psych  Neuromuscular disease    GI/Hepatic hiatal hernia, GERD-  ,  Endo/Other  Hypothyroidism   Renal/GU      Musculoskeletal   Abdominal   Peds  Hematology   Anesthesia Other Findings   Reproductive/Obstetrics                           Anesthesia Physical Anesthesia Plan  ASA: III  Anesthesia Plan: General   Post-op Pain Management:    Induction: Intravenous  Airway Management Planned: Oral ETT  Additional Equipment:   Intra-op Plan:   Post-operative Plan: Extubation in OR  Informed Consent: I have reviewed the patients History and Physical, chart, labs and discussed the procedure including the risks, benefits and alternatives for the proposed anesthesia with the patient or authorized representative who has indicated his/her understanding and acceptance.     Plan Discussed with: CRNA and Surgeon  Anesthesia Plan Comments:         Anesthesia Quick Evaluation

## 2012-02-26 NOTE — Anesthesia Postprocedure Evaluation (Signed)
Anesthesia Post Note  Patient: Kathryn Ryan  Procedure(s) Performed: Procedure(s) (LRB): POSTERIOR LUMBAR FUSION 1 WITH HARDWARE REMOVAL (N/A)  Anesthesia type: General  Patient location: PACU  Post pain: Pain level controlled and Adequate analgesia  Post assessment: Post-op Vital signs reviewed, Patient's Cardiovascular Status Stable, Respiratory Function Stable, Patent Airway and Pain level controlled  Last Vitals:  Filed Vitals:   02/26/12 1230  BP:   Pulse: 100  Temp:   Resp: 13    Post vital signs: Reviewed and stable  Level of consciousness: awake, alert  and oriented  Complications: No apparent anesthesia complications

## 2012-02-26 NOTE — H&P (Signed)
Subjective: The patient is a 71 year old white female who I performed a previous L4-5 fusion on years ago. She has done well but has developed recurrent back buttocks and leg pain consistent with neurogenic claudication. She has failed medical management and was worked up with a lumbar myelo CT which demonstrated she had spinal stenosis at L3-4. I discussed the various treatment options including surgery. The patient has decided proceed with the operation.   Past Medical History  Diagnosis Date  . Hypertension   . Hypothyroidism   . GERD (gastroesophageal reflux disease)   . H/O hiatal hernia   . Neuropathy     non related to DM-not sure why  . Neuropathy     non DM related    Past Surgical History  Procedure Date  . Back surgery   . Abdominal hysterectomy   . Bunionectomy     bilateral  . Cholecystectomy   . Breast surgery     bilateral lumpectomies  . Left oophorectomy   . Tonsillectomy     6 yrs    Allergies  Allergen Reactions  . Sulfa Antibiotics Itching    History  Substance Use Topics  . Smoking status: Former Smoker -- 2.0 packs/day for 25 years    Types: Cigarettes    Quit date: 02/23/1995  . Smokeless tobacco: Never Used  . Alcohol Use: No    No family history on file. Prior to Admission medications   Medication Sig Start Date End Date Taking? Authorizing Provider  Calcium Carbonate-Vitamin D (CALCIUM + D PO) Take 1 tablet by mouth daily.   Yes Historical Provider, MD  desvenlafaxine (PRISTIQ) 50 MG 24 hr tablet Take 50 mg by mouth daily.   Yes Historical Provider, MD  Docusate Sodium (COLACE PO) Take 5 capsules by mouth daily.   Yes Historical Provider, MD  esomeprazole (NEXIUM) 40 MG capsule Take 40 mg by mouth daily before breakfast.   Yes Historical Provider, MD  estrogens, conjugated, (PREMARIN) 1.25 MG tablet Take 1.25 mg by mouth daily.   Yes Historical Provider, MD  fentaNYL (DURAGESIC - DOSED MCG/HR) 100 MCG/HR Place 2 patches onto the skin every 3  (three) days.   Yes Historical Provider, MD  gabapentin (NEURONTIN) 800 MG tablet Take 800 mg by mouth 4 (four) times daily.   Yes Historical Provider, MD  HYDROcodone-acetaminophen (VICODIN) 5-500 MG per tablet Take 2 tablets by mouth every 6 (six) hours as needed. For pain   Yes Historical Provider, MD  ibuprofen (ADVIL,MOTRIN) 200 MG tablet Take 400 mg by mouth every 8 (eight) hours as needed. For pain   Yes Historical Provider, MD  levothyroxine (SYNTHROID, LEVOTHROID) 50 MCG tablet Take 50 mcg by mouth daily.   Yes Historical Provider, MD  lisinopril-hydrochlorothiazide (PRINZIDE,ZESTORETIC) 20-25 MG per tablet Take 1 tablet by mouth daily.   Yes Historical Provider, MD  Multiple Vitamin (MULITIVITAMIN WITH MINERALS) TABS Take 1 tablet by mouth daily.   Yes Historical Provider, MD     Review of Systems  Positive ROS: As above  All other systems have been reviewed and were otherwise negative with the exception of those mentioned in the HPI and as above.  Objective: Vital signs in last 24 hours: Temp:  [98.5 F (36.9 C)] 98.5 F (36.9 C) (03/21 0615) Pulse Rate:  [99] 99  (03/21 0615) Resp:  [20] 20  (03/21 0615) BP: (133)/(68) 133/68 mmHg (03/21 0615) SpO2:  [94 %] 94 % (03/21 0615)  General Appearance: Alert, cooperative, no distress, appears stated  age Head: Normocephalic, without obvious abnormality, atraumatic Eyes: PERRL, conjunctiva/corneas clear, EOM's intact, fundi benign, both eyes      Ears: Normal TM's and external ear canals, both ears Throat: Lips, mucosa, and tongue normal; teeth and gums normal Neck: Supple, symmetrical, trachea midline, no adenopathy; thyroid: No enlargement/tenderness/nodules; no carotid bruit or JVD Back: Symmetric, no curvature, ROM normal, no CVA tenderness her incision is well-healed Lungs: Clear to auscultation bilaterally, respirations unlabored Heart: Regular rate and rhythm, S1 and S2 normal, no murmur, rub or gallop Abdomen: Soft,  non-tender, bowel sounds active all four quadrants, no masses, no organomegaly Extremities: Extremities normal, atraumatic, no cyanosis or edema Pulses: 2+ and symmetric all extremities Skin: Skin color, texture, turgor normal, no rashes or lesions  NEUROLOGIC:   Mental status: alert and oriented, no aphasia, good attention span, Fund of knowledge/ memory ok Motor Exam - grossly normal Sensory Exam - grossly normal Reflexes: Symmetric Coordination - grossly normal Gait - grossly normal Balance - grossly normal Cranial Nerves: I: smell Not tested  II: visual acuity  OS: Normal    OD: Normal   II: visual fields Full to confrontation  II: pupils Equal, round, reactive to light  III,VII: ptosis None  III,IV,VI: extraocular muscles  Full ROM  V: mastication Normal  V: facial light touch sensation  Normal  V,VII: corneal reflex  Present  VII: facial muscle function - upper  Normal  VII: facial muscle function - lower Normal  VIII: hearing Not tested  IX: soft palate elevation  Normal  IX,X: gag reflex Present  XI: trapezius strength  5/5  XI: sternocleidomastoid strength 5/5  XI: neck flexion strength  5/5  XII: tongue strength  Normal    Data Review Lab Results  Component Value Date   WBC 11.9* 02/23/2012   HGB 13.1 02/23/2012   HCT 38.5 02/23/2012   MCV 95.5 02/23/2012   PLT 238 02/23/2012   Lab Results  Component Value Date   NA 139 02/23/2012   K 3.6 02/23/2012   CL 97 02/23/2012   CO2 31 02/23/2012   BUN 14 02/23/2012   CREATININE 0.88 02/23/2012   GLUCOSE 122* 02/23/2012   No results found for this basename: INR, PROTIME    Assessment/Plan: L3-4 spinal stenosis, disc herniation lumbago, lumbar radiculopathy: I discussed situation with the patient. I reviewed her myelogram with her. I pointed out the abnormalities. We have discussed the various treatment options including surgery. I've shown her surgical models. We described the procedure of an L3-4 decompression  instrumentation and fusion with exploration of her old fusion. We have discussed the risks, benefits, alternatives and likelihood of achieving our goals with surgery. I have answered all her questions. She wants to proceed with the operation.   Sulma Ruffino D 02/26/2012 7:27 AM

## 2012-02-26 NOTE — Transfer of Care (Signed)
Immediate Anesthesia Transfer of Care Note  Patient: Kathryn Ryan  Procedure(s) Performed: Procedure(s) (LRB): POSTERIOR LUMBAR FUSION 1 WITH HARDWARE REMOVAL (N/A)  Patient Location: PACU  Anesthesia Type: General  Level of Consciousness: awake, alert  and oriented  Airway & Oxygen Therapy: Patient Spontanous Breathing and Patient connected to nasal cannula oxygen  Post-op Assessment: Report given to PACU RN and Post -op Vital signs reviewed and stable  Post vital signs: Reviewed and stable  Complications: No apparent anesthesia complications

## 2012-02-26 NOTE — Preoperative (Signed)
Beta Blockers   Reason not to administer Beta Blockers:Not Applicable 

## 2012-02-26 NOTE — Anesthesia Procedure Notes (Signed)
Procedure Name: Intubation Date/Time: 02/26/2012 7:41 AM Performed by: Quentin Ore Pre-anesthesia Checklist: Patient identified, Emergency Drugs available, Suction available, Patient being monitored and Timeout performed Patient Re-evaluated:Patient Re-evaluated prior to inductionOxygen Delivery Method: Circle system utilized Preoxygenation: Pre-oxygenation with 100% oxygen Intubation Type: IV induction Ventilation: Mask ventilation without difficulty Laryngoscope Size: Mac and 3 Grade View: Grade I Tube type: Oral Tube size: 7.5 mm Airway Equipment and Method: Stylet Placement Confirmation: ETT inserted through vocal cords under direct vision,  positive ETCO2 and breath sounds checked- equal and bilateral Secured at: 20 cm Tube secured with: Tape Dental Injury: Teeth and Oropharynx as per pre-operative assessment

## 2012-02-26 NOTE — Op Note (Signed)
Brief history: The patient is a 71 year old white female who I performed a L4-5 decompression patient fusion years ago. The patient has had chronic back pain who more recently has developed increasing back buttocks and leg pain consistent with neurogenic claudication. She has failed medical management and was worked up with a lumbar MRI and myelo CT. This demonstrated patient and adjacent segment degenerative changes L3-4 with spinal stenosis. I discussed the situation with the patient . We discussed the various treatment options including surgery. The patient has decided proceed with a L3-4 decompression insultation and fusion after weighing the risks, benefits and alternative surgery.  Preoperative diagnosis: L3-4 Degenerative disc disease, spinal stenosis; lumbago; lumbar radiculopathy  Postoperative diagnosis: L3-4 Degenerative disc disease, spinal stenosis; lumbago; lumbar radiculopathy  Procedure: Bilateral redo L3 Laminotomy/foraminotomies to decompress the bilateral L3 and L4 nerve roots(the work required to do this was in addition to the work required to do the posterior lumbar interbody fusion because of the patient's spinal stenosis, facet arthropathy. Etc. requiring a wide decompression of the nerve roots.); L3-4 posterior lumbar interbody fusion with local morselized autograft bone and Actifusebone graft extender; insertion of interbody prosthesis at L3-4 (globus peek interbody prosthesis); posterior segmental instrumentation from L3 to L5 with [employer] Legacy titanium pedicle screws and rods; posterior lateral arthrodesis at L3-4 with local morselized autograft bone and Vitoss bone graft extender, exploration of lumbar fusion  Surgeon: Dr. Delma Officer  Asst.: Dr. Shirlean Kelly  Anesthesia: Gen. endotracheal  Estimated blood loss: 200 cc  Drains: None  Locations: None  Description of procedure: The patient was brought to the operating room by the anesthesia team. General  endotracheal anesthesia was induced. The patient was turned to the prone position on the Wilson frame. The patient's lumbosacral region was then prepared with Betadine scrub and Betadine solution. Sterile drapes were applied.  I then injected the area to be incised with Marcaine with epinephrine solution. I then used the scalpel to make a linear midline incision over the L3-4 interspace. I then used electrocautery to perform a bilateral subperiosteal dissection exposing the spinous process and lamina of L3 and L4. I used a cautery to expose the old hardware at L4-5. I then explored the fusion by removing the FROM the screws and try to independently move the pedicle screws there appeared to be good arthrodesis.We then inserted the Verstrac retractor to provide exposure.  I began the decompression by using the high speed drill to perform laminotomies at L3. We then used the Kerrison punches to widen the laminotomy and removed the remaining ligamentum flavum and scar tissue at L3-4. We used the Kerrison punches to remove the medial facets at L3-4. We performed wide foraminotomies about the bilateral L3 and L4 nerve roots completing the decompression.  We now turned our attention to the posterior lumbar interbody fusion. I used a scalpel to incise the intervertebral disc at L3-4. I then performed a partial intervertebral discectomy at L3-4 using the pituitary forceps. We prepared the vertebral endplates at L3-4 for the fusion by removing the soft tissues with the curettes. We then used the trial spacers to pick the appropriate sized interbody prosthesis. We prefilled his prosthesis with a combination of local morselized autograft bone that we obtained during the decompression as well as Actifuse bone graft extender. We inserted the prefilled prosthesis into the interspace at L3-4. There was a good snug fit of the prosthesis in the interspace. We then filled and the remainder of the intervertebral disc space with  local  morselized autograft bone and Actifuse. This completed the posterior lumbar interbody arthrodesis.  We now turned attention to the instrumentation. Under fluoroscopic guidance we cannulated the bilateral L3 pedicles with the bone probe. We then removed the bone probe. He then tapped the pedicle with a 6.5 millimeter tap. We then removed the tap. We probed inside the tapped pedicle with a ball probe to rule out cortical breaches. We then inserted a 7.5 x 50 millimeter pedicle screw into the L3 pedicles bilaterally under fluoroscopic guidance. We then palpated along the medial aspect of the pedicles to rule out cortical breaches. There were none. The nerve roots were not injured. We then connected the unilateral pedicle screws from L3-L5 with a lordotic rod. We compressed the construct and secured the rod in place with the caps. We then tightened the caps appropriately. This completed the instrumentation from L3-L5.  We now turned our attention to the posterior lateral arthrodesis at L3-4. We used the high-speed drill to decorticate the remainder of the facets, pars, transverse process at L3 and L4. We then applied a combination of local morselized autograft bone and Vitoss bone graft extender over these decorticated posterior lateral structures. This completed the posterior lateral arthrodesis.  We then obtained hemostasis using bipolar electrocautery. We irrigated the wound out with bacitracin solution. We inspected the thecal sac and nerve roots and noted they were well decompressed. We then removed the retractor. We reapproximated patient's thoracolumbar fascia with interrupted #1 Vicryl suture. We reapproximated patient's subcutaneous tissue with interrupted 2-0 Vicryl suture. The reapproximated patient's skin with Steri-Strips and benzoin. The wound was then coated with bacitracin ointment. A sterile dressing was applied. The drapes were removed. The patient was subsequently returned to the supine  position where they were extubated by the anesthesia team. He was then transported to the post anesthesia care unit in stable condition. All sponge instrument and needle counts were correct at the end of this case.

## 2012-02-26 NOTE — Progress Notes (Signed)
Subjective:  The patient is alert and pleasant. She looks well. Her back is appropriate sore.  Objective: Vital signs in last 24 hours: Temp:  [98.2 F (36.8 C)-98.5 F (36.9 C)] 98.2 F (36.8 C) (03/21 1122) Pulse Rate:  [69-117] 108  (03/21 1530) Resp:  [12-23] 15  (03/21 1530) BP: (132-188)/(68-89) 173/83 mmHg (03/21 1515) SpO2:  [92 %-96 %] 95 % (03/21 1530)  Intake/Output from previous day:   Intake/Output this shift: Total I/O In: 3100 [I.V.:2600; IV Piggyback:500] Out: 330 [Urine:80; Blood:250]  Physical exam the patient is alert and oriented. Her strength is normal in her lower extremities.  Lab Results: No results found for this basename: WBC:2,HGB:2,HCT:2,PLT:2 in the last 72 hours BMET No results found for this basename: NA:2,K:2,CL:2,CO2:2,GLUCOSE:2,BUN:2,CREATININE:2,CALCIUM:2 in the last 72 hours  Studies/Results: Dg Lumbar Spine 2-3 Views  02/26/2012  *RADIOLOGY REPORT*  Clinical Data: L3-L4 PLIF.  LUMBAR SPINE - 2-3 VIEW  Comparison: Lumbar myelogram CT 01/23/2012.  Findings: Spot PA and lateral fluoroscopic images of the lower lumbar spine demonstrate interval laminectomy at L3-L4 with placement of bilateral L3 pedicle screws.  L3-L4 interbody spacer appears well positioned.  The interconnecting rods have not yet been extended across the L3-L4 level.  The preexisting hardware at L4-L5 appears unchanged.  IMPRESSION: Intraoperative views during extension of lumbar fusion superiorly across L3-L4 level.  Original Report Authenticated By: Gerrianne Scale, M.D.    Assessment/Plan: The patient is doing well.  LOS: 0 days     Cathern Tahir D 02/26/2012, 3:40 PM

## 2012-02-27 ENCOUNTER — Encounter (HOSPITAL_COMMUNITY): Payer: Self-pay | Admitting: *Deleted

## 2012-02-27 LAB — BASIC METABOLIC PANEL
BUN: 12 mg/dL (ref 6–23)
CO2: 33 mEq/L — ABNORMAL HIGH (ref 19–32)
Calcium: 8.5 mg/dL (ref 8.4–10.5)
Creatinine, Ser: 0.87 mg/dL (ref 0.50–1.10)
GFR calc non Af Amer: 66 mL/min — ABNORMAL LOW (ref >90)
Glucose, Bld: 134 mg/dL — ABNORMAL HIGH (ref 70–99)

## 2012-02-27 LAB — CBC
HCT: 32 % — ABNORMAL LOW (ref 36.0–46.0)
Hemoglobin: 10.4 g/dL — ABNORMAL LOW (ref 12.0–15.0)
MCH: 32.2 pg (ref 26.0–34.0)
MCHC: 32.5 g/dL (ref 30.0–36.0)
MCV: 99.1 fL (ref 78.0–100.0)

## 2012-02-27 MED ORDER — MORPHINE SULFATE 2 MG/ML IJ SOLN: 2.0000 mg | INTRAMUSCULAR | Status: DC | PRN

## 2012-02-27 NOTE — Progress Notes (Signed)
Clinical Social Worker received referral for new skilled nursing facility placement.  Per chart there was an order for SNF versus home with home health.  Per PT evaluation patient is able to return home with home health following.  CSW to make CM aware.  Clinical Social Worker will sign off for now as social work intervention is no longer needed. Please consult [place] again if new need arises.  [address] [place], [place] [phone]

## 2012-02-27 NOTE — Progress Notes (Signed)
Physical Therapy Evaluation Patient Details Name: Kathryn Ryan MRN: [id number] DOB: [birth year] Today's Date: 02/27/2012  Problem List: There is no problem list on file for this patient.   Past Medical History:  Past Medical History  Diagnosis Date  . Hypertension   . Hypothyroidism   . GERD (gastroesophageal reflux disease)   . H/O hiatal hernia   . Neuropathy     non related to DM-not sure why  . Neuropathy     non DM related   Past Surgical History:  Past Surgical History  Procedure Date  . Back surgery   . Abdominal hysterectomy   . Bunionectomy     bilateral  . Cholecystectomy   . Breast surgery     bilateral lumpectomies  . Left oophorectomy   . Tonsillectomy     6 yrs    PT Assessment/Plan/Recommendation PT Assessment Clinical Impression Statement: Pt presents with a medical diagnosis of L3-4 fusion along with the following impairments/deficits and therapy diagnosis listed below. Pt will benefit from skilled PT in the acute care setting in order to maximize functional mobility for a safe d/c home PT Recommendation/Assessment: Patient will need skilled PT in the acute care venue PT Problem List: Decreased strength;Decreased activity tolerance;Decreased mobility;Decreased knowledge of use of DME;Decreased knowledge of precautions;Decreased safety awareness;Pain PT Therapy Diagnosis : Difficulty walking;Acute pain PT Plan PT Frequency: Min 5X/week PT Treatment/Interventions: DME instruction;Gait training;Stair training;Functional mobility training;Therapeutic activities;Patient/family education PT Recommendation Follow Up Recommendations: Home health PT;Supervision - Intermittent Equipment Recommended: None recommended by OT;None recommended by PT PT Goals  Acute Rehab PT Goals PT Goal Formulation: With patient Time For Goal Achievement: 7 days Pt will go Supine/Side to Sit: with modified independence PT Goal: Supine/Side to Sit - Progress: Goal set today Pt  will go Sit to Supine/Side: with modified independence PT Goal: Sit to Supine/Side - Progress: Goal set today Pt will go Sit to Stand: with supervision PT Goal: Sit to Stand - Progress: Goal set today Pt will go Stand to Sit: with supervision PT Goal: Stand to Sit - Progress: Goal set today Pt will Transfer Bed to Chair/Chair to Bed: with supervision PT Transfer Goal: Bed to Chair/Chair to Bed - Progress: Goal set today Pt will Ambulate: 51 - 150 feet;with supervision;with least restrictive assistive device PT Goal: Ambulate - Progress: Goal set today Pt will Go Up / Down Stairs: 6-9 stairs;with supervision;with rail(s) PT Goal: Up/Down Stairs - Progress: Goal set today  PT Evaluation Precautions/Restrictions  Precautions Precautions: Back Precaution Booklet Issued: Yes (comment) Precaution Comments: pt educated on 3/3 back precautions Required Braces or Orthoses: Yes Spinal Brace: Lumbar corset Restrictions Weight Bearing Restrictions: No Prior Functioning  Home Living Lives With: Alone Receives Help From: Family Type of Home: House Home Layout: One level;Laundry or work area in basement [employer] in basement) Home Access: Stairs to enter Entrance Stairs-Rails: Engineer, manufacturing of Steps: 8 Bathroom Shower/Tub: Door;Walk-in Stage manager: Handicapped height (has both) Bathroom Accessibility: Yes How Accessible: Accessible via walker Home Adaptive Equipment: Built-in shower seat;Walker - rolling;Bedside commode/3-in-1 (can get 3-1 from sister in law) Prior Function Level of Independence: Independent with basic ADLs;Independent with gait;Independent with transfers;Independent with homemaking with ambulation Able to Take Stairs?: Yes Driving: Yes Vocation: Retired Leisure: Hobbies-yes (Comment) Comments: volunteer Cognition Cognition Arousal/Alertness: Awake/alert Overall Cognitive Status: Appears within functional limits for tasks  assessed Orientation Level: Oriented X4 Sensation/Coordination Sensation Light Touch: Appears Intact Extremity Assessment RUE Assessment RUE Assessment: Within Functional Limits LUE Assessment  LUE Assessment: Within Functional Limits RLE Assessment RLE Assessment: Exceptions to [facility] except hip flexion) RLE Strength Right Hip Flexion: 3/5 (pt unable to take resistance secondary to pain) LLE Assessment LLE Assessment: Exceptions to [facility] except hip flexion) LLE Strength Left Hip Flexion: 3/5 (pt unable to take any resistance seconday to pain) Mobility (including Balance) Bed Mobility Bed Mobility: Yes Rolling Left: 4: Min assist;With rail Rolling Left Details (indicate cue type and reason): cueing for technique and assist to keep back straight throughout roll. Left Sidelying to Sit: 3: Mod assist Left Sidelying to Sit Details (indicate cue type and reason): assist to support trunk OOB. cueign for sequencing and safe technique Sitting - Scoot to [place] of Bed: 4: Min assist Sitting - Scoot to [place] of Bed Details (indicate cue type and reason): cueing for weight shifting for efficient scooting Transfers Transfers: Yes Sit to Stand: From bed;With upper extremity assist;Other (comment) (min guard assist) Sit to Stand Details (indicate cue type and reason): cueing for safe hand placement Stand to Sit: To chair/3-in-1;With armrests;With upper extremity assist;Other (comment) (min guard assist) Stand to Sit Details: cueing for hand placement and to control descent Ambulation/Gait Ambulation/Gait: Yes Ambulation/Gait Assistance: 4: Min assist Ambulation/Gait Assistance Details (indicate cue type and reason): VC for sequencing and safety with distance to RW. Pt required 3 rest breaks throughout ambulation trial secondary to fatigue. Ambulation Distance (Feet): 50 Feet Assistive device: Rolling walker Gait Pattern: Step-to pattern;Decreased stride length;Decreased hip/knee flexion -  right;Decreased hip/knee flexion - left;Decreased trunk rotation;Trunk flexed Gait velocity: decreased gait speed Stairs: No    Exercise    End of Session PT - End of Session Equipment Utilized During Treatment: Gait belt Activity Tolerance: Patient limited by fatigue;Patient limited by pain Patient left: in chair;with call bell in reach Nurse Communication: Mobility status for transfers;Mobility status for ambulation General Behavior During Session: [facility] for tasks performed Cognition: [facility] for tasks performed  Milana Kidney 02/27/2012, 12:17 PM  02/27/2012 Milana Kidney DPT PAGER: [phone] OFFICE: [phone]

## 2012-02-27 NOTE — Progress Notes (Addendum)
Met with pt re HH needs, per PT/OT eval no DME needs, no HHOT needs, HHPT only needed.  Will ask [facility] to provide services to this pt for HHPT.  Johny Shock RN MPH Case Manager [phone]     CARE MANAGEMENT NOTE 02/27/2012  Patient:  CALISSA, SWENOR   Account Number:  [id number]  Date Initiated:  02/27/2012  Documentation initiated by:  Vikrant Pryce  Subjective/Objective Assessment:   Order for [facility] and DME as needed.     Action/Plan:   Met with pt who selected [facility] for HHPT.   Anticipated DC Date:  02/29/2012   Anticipated DC Plan:  HOME W HOME HEALTH SERVICES         [facility] Choice  HOME HEALTH   Choice offered to / List presented to:  C-1 Patient        HH arranged  HH-2 PT      [facility] agency  [facility]   Status of service:  Completed, signed off Medicare Important Message given?   (If response is "NO", the following Medicare IM given date fields will be blank) Date Medicare IM given:   Date Additional Medicare IM given:    Discharge Disposition:  HOME W HOME HEALTH SERVICES  Per UR Regulation:  Reviewed for med. necessity/level of care/duration of stay  If discussed at Long Length of Stay Meetings, dates discussed:    Comments:  02/27/2012 Pt selected [facility] for HHPT, no HHOT or DME needs. Johny Shock RN MPH Case Manager [phone]

## 2012-02-27 NOTE — Progress Notes (Signed)
Occupational Therapy Evaluation Patient Details Name: Kathryn Ryan MRN: [id number] DOB: Jul 04, 1941 Today's Date: 02/27/2012  Problem List: There is no problem list on file for this patient.   Past Medical History:  Past Medical History  Diagnosis Date  . Hypertension   . Hypothyroidism   . GERD (gastroesophageal reflux disease)   . H/O hiatal hernia   . Neuropathy     non related to DM-not sure why  . Neuropathy     non DM related   Past Surgical History:  Past Surgical History  Procedure Date  . Back surgery   . Abdominal hysterectomy   . Bunionectomy     bilateral  . Cholecystectomy   . Breast surgery     bilateral lumpectomies  . Left oophorectomy   . Tonsillectomy     6 yrs    OT Assessment/Plan/Recommendation OT Assessment Clinical Impression Statement: Pt s/p lumbar fusion thus affecting PLOF.  Will benefit from acute OT services to address below problem list in prep for d/c home with assist from family. OT Recommendation/Assessment: Patient will need skilled OT in the acute care venue OT Problem List: Decreased knowledge of precautions;Pain;Decreased activity tolerance OT Therapy Diagnosis : Acute pain OT Plan OT Frequency: Min 2X/week OT Treatment/Interventions: Self-care/ADL training;DME and/or AE instruction;Therapeutic activities;Patient/family education OT Recommendation Follow Up Recommendations: Supervision - Intermittent Equipment Recommended: None recommended by OT Individuals Consulted Consulted and Agree with Results and Recommendations: Patient OT Goals Acute Rehab OT Goals OT Goal Formulation: With patient Time For Goal Achievement: 7 days ADL Goals Pt Will Perform Grooming: with modified independence;Standing at sink ADL Goal: Grooming - Progress: Goal set today Pt Will Perform Lower Body Dressing: with modified independence;Sit to stand from chair;Sit to stand from bed;with adaptive equipment ADL Goal: Lower Body Dressing - Progress:  Goal set today Pt Will Transfer to Toilet: with modified independence;with DME;Ambulation;3-in-1 ADL Goal: Toilet Transfer - Progress: Goal set today Pt Will Perform Toileting - Clothing Manipulation: with modified independence;Standing;Sitting on 3-in-1 or toilet ADL Goal: Toileting - Clothing Manipulation - Progress: Goal set today Pt Will Perform Toileting - Hygiene: with modified independence;Standing at 3-in-1/toilet;Sit to stand from 3-in-1/toilet;Sitting on 3-in-1 or toilet ADL Goal: Toileting - Hygiene - Progress: Goal set today Pt Will Perform Tub/Shower Transfer: Shower transfer;with modified independence;Ambulation;with DME ADL Goal: Tub/Shower Transfer - Progress: Goal set today Miscellaneous OT Goals Miscellaneous OT Goal #1: Pt will don/doff back brace with mod I in prep for ADL activity. OT Goal: Miscellaneous Goal #1 - Progress: Goal set today Miscellaneous OT Goal #2: Pt will verbalize and demonstrate 3/3 back precautions during all ADL acitvity. OT Goal: Miscellaneous Goal #2 - Progress: Goal set today  OT Evaluation Precautions/Restrictions  Precautions Precautions: Back Precaution Booklet Issued: Yes (comment) Precaution Comments: pt educated on 3/3 back precautions Required Braces or Orthoses: Yes Spinal Brace: Lumbar corset Restrictions Weight Bearing Restrictions: No Prior Functioning Home Living Lives With: Alone Receives Help From: Family Type of Home: House Home Layout: One level;Laundry or work area in basement [employer] in basement) Home Access: Stairs to enter Entrance Stairs-Rails: Engineer, manufacturing of Steps: 8 Bathroom Shower/Tub: Door;Walk-in Stage manager: Handicapped height (has both) Bathroom Accessibility: Yes How Accessible: Accessible via walker Home Adaptive Equipment: Built-in shower seat;Walker - rolling;Bedside commode/3-in-1 (can get 3-1 from sister in law) Prior Function Level of Independence: Independent with  basic ADLs;Independent with gait;Independent with transfers;Independent with homemaking with ambulation Able to Take Stairs?: Yes Driving: Yes Vocation: Retired Leisure: Hobbies-yes (Comment)  Comments: volunteer ADL ADL Lower Body Bathing: Simulated;Moderate assistance Lower Body Bathing Details (indicate cue type and reason): Pt unable to cross ankles over knees in order to reach feet safely. Where Assessed - Lower Body Bathing: Sitting, bed Lower Body Dressing: Simulated;Moderate assistance Lower Body Dressing Details (indicate cue type and reason): Pt unable to cross ankles over knees in order to reach feet safely. Where Assessed - Lower Body Dressing: Sitting, bed Toilet Transfer: Simulated;Minimal assistance Toilet Transfer Details (indicate cue type and reason): min assist for maneuvering RW during turns Toilet Transfer Method: Ambulating Toilet Transfer Equipment: Other (comment) (chair) Equipment Used: Rolling walker Ambulation Related to ADLs: Pt ambulated  ADL Comments: Pt donned back brace with mod assist. sitting EOB. Vision/Perception    Cognition Cognition Arousal/Alertness: Awake/alert Overall Cognitive Status: Appears within functional limits for tasks assessed Orientation Level: Oriented X4 Sensation/Coordination Sensation Light Touch: Appears Intact Extremity Assessment RUE Assessment RUE Assessment: Within Functional Limits LUE Assessment LUE Assessment: Within Functional Limits Mobility  Bed Mobility Bed Mobility: Yes Rolling Left: 4: Min assist;With rail Rolling Left Details (indicate cue type and reason): cueing for technique and assist to keep back straight throughout roll. Left Sidelying to Sit: 3: Mod assist Left Sidelying to Sit Details (indicate cue type and reason): assist to support trunk OOB. cueign for sequencing and safe technique Sitting - Scoot to [place] of Bed: 4: Min assist Sitting - Scoot to [place] of Bed Details (indicate cue type and  reason): cueing for weight shifting for efficient scooting Transfers Transfers: Yes Sit to Stand: From bed;With upper extremity assist;Other (comment) (min guard assist) Sit to Stand Details (indicate cue type and reason): cueing for safe hand placement Stand to Sit: To chair/3-in-1;With armrests;With upper extremity assist;Other (comment) (min guard assist) Stand to Sit Details: cueing for hand placement and to control descent Exercises   End of Session OT - End of Session Equipment Utilized During Treatment: Back brace Activity Tolerance: Patient limited by fatigue Patient left: in chair;with call bell in reach Nurse Communication: Mobility status for transfers General Behavior During Session: [facility] for tasks performed Cognition: [facility] for tasks performed   10:18 AM  02/27/2012 Cipriano Mile OTR/L Pager [phone] Office [phone]

## 2012-02-27 NOTE — Progress Notes (Signed)
UR COMPLETED  

## 2012-02-27 NOTE — Progress Notes (Signed)
Patient ID: ANJA NEUZIL, female   DOB: 10-15-1941, 71 y.o.   MRN: [id number] Subjective:  The patient is pleasant and mildly somnolent. She looks well.  Objective: Vital signs in last 24 hours: Temp:  [98.2 F (36.8 C)-99.9 F (37.7 C)] 99.5 F (37.5 C) (03/22 0200) Pulse Rate:  [69-117] 106  (03/22 0200) Resp:  [12-23] 16  (03/22 0434) BP: (118-188)/(66-89) 118/66 mmHg (03/22 0200) SpO2:  [92 %-96 %] 94 % (03/22 0434) FiO2 (%):  [51 %-54 %] 51 % (03/22 0434) Weight:  [107.1 kg (236 lb 1.8 oz)] 107.1 kg (236 lb 1.8 oz) (03/22 0107)  Intake/Output from previous day: 03/21 0701 - 03/22 0700 In: 3150 [I.V.:2600; IV Piggyback:550] Out: 730 [Urine:480; Blood:250] Intake/Output this shift:    Physical exam the patient is mildly somnolent but easily arousable. She is oriented. She is moving all 4 extremities well. Her dressing is clean and dry.  Lab Results:  [facility] 02/27/12 0545  WBC 13.9*  HGB 10.4*  HCT 32.0*  PLT 204   BMET  Basename 02/27/12 0545  NA 135  K 3.5  CL 95*  CO2 33*  GLUCOSE 134*  BUN 12  CREATININE 0.87  CALCIUM 8.5    Studies/Results: Dg Lumbar Spine 2-3 Views  02/26/2012  *RADIOLOGY REPORT*  Clinical Data: L3-L4 PLIF.  LUMBAR SPINE - 2-3 VIEW  Comparison: Lumbar myelogram CT 01/23/2012.  Findings: Spot PA and lateral fluoroscopic images of the lower lumbar spine demonstrate interval laminectomy at L3-L4 with placement of bilateral L3 pedicle screws.  L3-L4 interbody spacer appears well positioned.  The interconnecting rods have not yet been extended across the L3-L4 level.  The preexisting hardware at L4-L5 appears unchanged.  IMPRESSION: Intraoperative views during extension of lumbar fusion superiorly across L3-L4 level.  Original Report Authenticated By: Gerrianne Scale, M.D.    Assessment/Plan: Postop day 1: The patient is doing well. We will discontinue her Foley catheter and PCA pump. We will mobilize her with PT and OT. She will likely go  home on Sunday.  LOS: 1 day     Vyncent Overby D 02/27/2012, 7:31 AM

## 2012-02-28 MED ORDER — FENTANYL 50 MCG/HR TD PT72
100.0000 ug | MEDICATED_PATCH | TRANSDERMAL | Status: DC
  Administered 2012-02-28 – 2012-03-02 (×2): 100 ug via TRANSDERMAL
  Filled 2012-02-28 (×2): qty 2

## 2012-02-28 MED ORDER — DIAZEPAM 5 MG PO TABS: 5.0000 mg | ORAL_TABLET | Freq: Four times a day (QID) | ORAL | Status: DC | PRN

## 2012-02-28 NOTE — Progress Notes (Signed)
..  OT Cancellation Note  Treatment cancelled today due to patient's refusal to participate:  Attempted skilled O.T. Tx. Today x 2, both times pt. States she has a headache and did not want to "fool with it today". Will try back if time permits.  Thank you, Tommy Medal, COTA/L Acute Rehabilitation

## 2012-02-28 NOTE — Progress Notes (Signed)
Physical Therapy Treatment Patient Details Name: Kathryn Ryan MRN: [id number] DOB: 1941-07-10 Today's Date: 02/28/2012  PT Assessment/Plan  PT - Assessment/Plan Comments on Treatment Session: Pt progressing well, still limited minimally from fatigue requiring rest breaks on long ambulation distances. Pt completed stairs with min assist. PT Plan: Discharge plan remains appropriate;Frequency remains appropriate PT Frequency: Min 5X/week Follow Up Recommendations: Home health PT;Supervision - Intermittent Equipment Recommended: None recommended by OT;None recommended by PT PT Goals  Acute Rehab PT Goals PT Goal Formulation: With patient PT Goal: Sit to Supine/Side - Progress: Progressing toward goal PT Goal: Sit to Stand - Progress: Met PT Goal: Stand to Sit - Progress: Met PT Transfer Goal: Bed to Chair/Chair to Bed - Progress: Progressing toward goal PT Goal: Ambulate - Progress: Progressing toward goal PT Goal: Up/Down Stairs - Progress: Progressing toward goal  PT Treatment Precautions/Restrictions  Precautions Precautions: Back Precaution Booklet Issued: Yes (comment) Precaution Comments: pt able to recall 2/3 back precautions Required Braces or Orthoses: Yes Spinal Brace: Lumbar corset Restrictions Weight Bearing Restrictions: No Mobility (including Balance) Bed Mobility Bed Mobility: Yes Sitting - Scoot to Edge of Bed: 6: Modified independent (Device/Increase time) Sit to Supine: 5: Supervision;HOB elevated (comment degrees);With rail Sit to Supine - Details (indicate cue type and reason): VC for proper sequencing to maintain back precautions Transfers Transfers: Yes Sit to Stand: 5: Supervision;With upper extremity assist;From bed Sit to Stand Details (indicate cue type and reason): VC for hand placement and sequencing to maintain back precautions Stand to Sit: 4: Min assist;With upper extremity assist;To bed Stand to Sit Details: VC for hand placement. Able to  control descent safely onto bed Ambulation/Gait Ambulation/Gait: Yes Ambulation/Gait Assistance: 5: Supervision Ambulation/Gait Assistance Details (indicate cue type and reason): VC for safe technique with RW. Pt required 1 rest break during trial Ambulation Distance (Feet): 50 Feet (to/ from gym) Assistive device: Rolling walker Gait Pattern: Step-to pattern;Decreased stride length;Decreased hip/knee flexion - right;Decreased hip/knee flexion - left;Decreased trunk rotation;Trunk flexed Gait velocity: decreased gait speed Stairs: Yes Stairs Assistance: 4: Min assist Stairs Assistance Details (indicate cue type and reason): VC for proper sequencing and safety on stairs. Assist for stability Stair Management Technique: One rail Right;Other (comment);Step to pattern;Forwards (HH left) Number of Stairs: 5     Exercise    End of Session PT - End of Session Equipment Utilized During Treatment: Gait belt Activity Tolerance: Patient tolerated treatment well Patient left: in bed;with call bell in reach Nurse Communication: Mobility status for transfers;Mobility status for ambulation General Behavior During Session: [facility] for tasks performed Cognition: [facility] for tasks performed  Milana Kidney 02/28/2012, 12:35 PM  02/28/2012 Milana Kidney DPT PAGER: [phone] OFFICE: [phone]

## 2012-02-28 NOTE — Progress Notes (Signed)
Patient ID: Kathryn Ryan, female   DOB: November 14, 1941, 71 y.o.   MRN: [id number] BP 112/55  Pulse 50  Temp(Src) 97.8 F (36.6 C) (Oral)  Resp 20  Ht 5\' 7"  (1.702 m)  Wt 107.1 kg (236 lb 1.8 oz)  BMI 36.98 kg/m2  SpO2 93% Alert and oriented x 4.  Moving all extremities. Wound clean and dry. No signs of infection. Dc tomorrow

## 2012-02-29 MED ORDER — METHOCARBAMOL 500 MG PO TABS
500.0000 mg | ORAL_TABLET | Freq: Four times a day (QID) | ORAL | Status: DC | PRN
  Administered 2012-02-29 – 2012-03-03 (×7): 500 mg via ORAL
  Filled 2012-02-29 (×7): qty 1

## 2012-02-29 MED ORDER — DIAZEPAM 5 MG PO TABS: 5.0000 mg | ORAL_TABLET | Freq: Four times a day (QID) | ORAL | Status: AC | PRN

## 2012-02-29 MED ORDER — HYDROCODONE-ACETAMINOPHEN 5-325 MG PO TABS: 1.0000 | ORAL_TABLET | ORAL | Status: AC | PRN

## 2012-02-29 NOTE — Progress Notes (Signed)
Physical Therapy Treatment Patient Details Name: Kathryn Ryan MRN: [id number] DOB: 02/17/1941 Today's Date: 02/29/2012  PT Assessment/Plan  PT - Assessment/Plan Comments on Treatment Session: Spoke with nurse outside pt's room re: family concerns for pt going home alone. Pt is not at a modified independent level with any mobility, per chart review she required min assist with stairs. Per family pt will have only supervision from an elderly frail family member with return home. Pt also demonstrates inability to don brace by self or safely negotiate tight spaces with walker, tends to leave RW behind or lift RW high to clear obstacles demonstrating decreased safety with return home. Suspect pt will have great difficulty caring for self alone and will benefit from short term SNF to promote safe return home and increase independence. Spoke at length with pt and family, pt agreeable to SNF as is family.  PT Plan: Frequency remains appropriate;Discharge plan needs to be updated PT Frequency: Min 5X/week Follow Up Recommendations: Skilled nursing facility;Supervision for mobility/OOB Equipment Recommended: Defer to next venue PT Goals  Acute Rehab PT Goals Pt will go Supine/Side to Sit: with modified independence PT Goal: Supine/Side to Sit - Progress: Progressing toward goal Pt will go Sit to Supine/Side: with modified independence PT Goal: Sit to Supine/Side - Progress: Progressing toward goal Pt will go Sit to Stand: with supervision PT Goal: Sit to Stand - Progress: Progressing toward goal Pt will go Stand to Sit: with supervision PT Goal: Stand to Sit - Progress: Progressing toward goal Pt will Transfer Bed to Chair/Chair to Bed: with supervision PT Transfer Goal: Bed to Chair/Chair to Bed - Progress: Progressing toward goal Pt will Ambulate: 51 - 150 feet;with supervision;with least restrictive assistive device PT Goal: Ambulate - Progress: Progressing toward goal Pt will Go Up / Down  Stairs: 6-9 stairs;with supervision;with rail(s) PT Goal: Up/Down Stairs - Progress: Progressing toward goal  PT Treatment Precautions/Restrictions  Precautions Precautions: Back;Fall Precaution Booklet Issued: Yes (comment) Precaution Comments: pt able to recall 2/3 back precautions. Pt requires cues to remember "twisting." Pt requiring moderate verbal cues and min assist to don her back brace appropriately. Required Braces or Orthoses: Yes Spinal Brace: Lumbar corset Restrictions Weight Bearing Restrictions: No Mobility (including Balance) Bed Mobility Bed Mobility: Yes Rolling Left: 5: Supervision;With rail Rolling Left Details (indicate cue type and reason): Pt having increased difficulty today, more painful. Pt requires verbal cues to prevent twisting and to complete roll. Requires rail. Left Sidelying to Sit: 5: Supervision;With rails Left Sidelying to Sit Details (indicate cue type and reason): Close supervision, pt again having increased difficulty. Must utilize rails, if did not have rails would likely have significant difficulty and may damage back.  Sitting - Scoot to Edge of Bed: 6: Modified independent (Device/Increase time) Transfers Transfers: Yes Sit to Stand: With upper extremity assist;From bed;4: Min assist Sit to Stand Details (indicate cue type and reason): Min assist to clear bed, pt able to complete rest of stand. Pt with multiple attempts required. Verbal cues needed to avoid pulling on RW and to place UE safely.  Stand to Sit: 4: Min assist;With upper extremity assist;To toilet Stand to Sit Details: Verbal cues for UE placement, assist needed to control descent. Cues to back completely to toilet prior to sitting, pt attempting to sit too prematurely.  Ambulation/Gait Ambulation/Gait: Yes Ambulation/Gait Assistance: 5: Supervision Ambulation/Gait Assistance Details (indicate cue type and reason): Supervision for safety. Pt attempting to leave RW behind before  entering bathroom, then had great difficulty  negotiating RW in bathroom and lifted RW high in air to clear obstacles indicating decreased safety awareness. Pt educated on safe utilization of RW and importance of decreasing risk for falls.  Ambulation Distance (Feet): 15 Feet Assistive device: Rolling walker Gait Pattern: Decreased stride length;Decreased hip/knee flexion - right;Decreased hip/knee flexion - left;Decreased trunk rotation;Trunk flexed;Step-through pattern Gait velocity: decreased gait speed Stairs: No  Posture/Postural Control Posture/Postural Control: No significant limitations End of Session PT - End of Session Equipment Utilized During Treatment: Gait belt Activity Tolerance: Patient tolerated treatment well Patient left: with call bell in reach;Other (comment) (in shower with nursing) Nurse Communication: Mobility status for ambulation General Behavior During Session: [facility] for tasks performed Cognition: [facility] for tasks performed  Wilhemina Bonito 02/29/2012, 5:20 PM  Sherie Don) Carleene Mains PT, DPT Acute Rehabilitation [phone]

## 2012-02-29 NOTE — Progress Notes (Signed)
Will cancel discharge today, as patient is extremely sleepy and immediately drifts off to sleep.  Not getting to bathroom on her own even with help and lives alone.  We spoke about decreasing fentanyl patch, but patient states she has been on that for years and does not want to change. We will hold valium and try robaxin for spasms and see if she is less sedated with that medication.  Dr. Lovell Sheehan to reassess safety to discharge in am.

## 2012-02-29 NOTE — Progress Notes (Signed)
Subjective: Patient reports doing well and wants to go home.  Objective: Vital signs in last 24 hours: Temp:  [98.5 F (36.9 C)-100 F (37.8 C)] 98.7 F (37.1 C) (03/24 0606) Pulse Rate:  [98-111] 101  (03/24 0606) Resp:  [18-20] 20  (03/24 0606) BP: (93-121)/(56-67) 111/63 mmHg (03/24 0606) SpO2:  [85 %-97 %] 91 % (03/24 0606)  Intake/Output from previous day: 03/23 0701 - 03/24 0700 In: 1120 [P.O.:1120] Out: -  Intake/Output this shift:    Physical Exam: Dressing CDI, full lower extremity strength  Lab Results:  [facility] 02/27/12 0545  WBC 13.9*  HGB 10.4*  HCT 32.0*  PLT 204   BMET  Basename 02/27/12 0545  NA 135  K 3.5  CL 95*  CO2 33*  GLUCOSE 134*  BUN 12  CREATININE 0.87  CALCIUM 8.5    Studies/Results: No results found.  Assessment/Plan: Doing well.  D/C home.  F/U with Dr. Lovell Sheehan in office.    LOS: 3 days    Dorian Heckle, MD 02/29/2012, 7:24 AM

## 2012-02-29 NOTE — Discharge Summary (Signed)
Physician Discharge Summary  Patient ID: Kathryn Ryan MRN: [id number] DOB/AGE: [birth year] 71 y.o.  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 *    Discharged Condition: good  Hospital Course: Uncomplicated decompression and fusion L3/4  Consults: None  Significant Diagnostic Studies: None  Treatments: surgery: Uncomplicated decompression and fusion L3/4  Discharge Exam: Blood pressure 111/63, pulse 101, temperature 98.7 F (37.1 C), temperature source Oral, resp. rate 20, height 5\' 7"  (1.702 m), weight 107.1 kg (236 lb 1.8 oz), SpO2 91.00%. Neurologic: Alert and oriented X 3, normal strength and tone. Normal symmetric reflexes. Normal coordination and gait Wound:CDI  Disposition: Home  Discharge Orders    Future Orders Please Complete By Expires   Home Health      Questions: Responses:   To provide the following care/treatments PT    OT   Face-to-face encounter      Comments:   I Mescal Flinchbaugh D certify that this patient is under my care and that I, or a nurse practitioner or physician's assistant working with me, had a face-to-face encounter that meets the physician face-to-face encounter requirements with this patient on 02/29/2012.       Questions: Responses:   The encounter with the patient was in whole, or in part, for the following medical condition, which is the primary reason for home health care yes   I certify that, based on my findings, the following services are medically necessary home health services Physical therapy   My clinical findings support the need for the above services Post Surgical with wound care needs   Further, I certify that my clinical findings support that this patient is homebound (i.e. absences from home require considerable and taxing effort and are for medical reasons or religious services or infrequently or of  short duration when for other reasons) Ambulates short distances less than 300 feet   To provide the following care/treatments PT    OT     Medication List  As of 02/29/2012 10:28 AM   STOP taking these medications         ibuprofen 200 MG tablet         TAKE these medications         CALCIUM + D PO   Take 1 tablet by mouth daily.      COLACE PO   Take 5 capsules by mouth daily.      diazepam 5 MG tablet   Commonly known as: VALIUM   Take 1 tablet (5 mg total) by mouth every 6 (six) hours as needed (muscle spasms).      esomeprazole 40 MG capsule   Commonly known as: NEXIUM   Take 40 mg by mouth daily before breakfast.      estrogens (conjugated) 1.25 MG tablet   Commonly known as: PREMARIN   Take 1.25 mg by mouth daily.      fentaNYL 100 MCG/HR   Commonly known as: DURAGESIC - dosed mcg/hr   Place 2 patches onto the skin every 3 (three) days.      gabapentin 800 MG tablet   Commonly known as: NEURONTIN   Take 800 mg by mouth 4 (four) times daily.      HYDROcodone-acetaminophen 5-500 MG per tablet   Commonly known as: VICODIN   Take 2 tablets by mouth every 6 (six) hours as needed. For pain      HYDROcodone-acetaminophen 5-325 MG per  tablet   Commonly known as: NORCO   Take 1-2 tablets by mouth every 4 (four) hours as needed for pain.      levothyroxine 50 MCG tablet   Commonly known as: SYNTHROID, LEVOTHROID   Take 50 mcg by mouth daily.      lisinopril-hydrochlorothiazide 20-25 MG per tablet   Commonly known as: PRINZIDE,ZESTORETIC   Take 1 tablet by mouth daily.      mulitivitamin with minerals Tabs   Take 1 tablet by mouth daily.      PRISTIQ 50 MG 24 hr tablet   Generic drug: desvenlafaxine   Take 50 mg by mouth daily.             Signed: Dorian Heckle, MD 02/29/2012, 10:28 AM

## 2012-03-01 MED FILL — Sodium Chloride IV Soln 0.9%: INTRAVENOUS | Qty: 1000 | Status: AC

## 2012-03-01 MED FILL — Sodium Chloride Irrigation Soln 0.9%: Qty: 3000 | Status: AC

## 2012-03-01 MED FILL — Heparin Sodium (Porcine) Inj 1000 Unit/ML: INTRAMUSCULAR | Qty: 30 | Status: AC

## 2012-03-01 NOTE — Progress Notes (Signed)
Occupational Therapy Treatment Patient Details Name: ULAH OLMO MRN: [id number] DOB: 03/15/1941 Today's Date: 03/01/2012  OT Assessment/Plan OT Assessment/Plan Comments on Treatment Session: encouragement for participation and task completion. in the middle of a task pt. would say "i can just finish this later", or "lets not do this"  OT Plan: Discharge plan needs to be updated;Other (comment) (pt. wanting/needing snf due to little A avail. at d/c) OT Frequency: Min 2X/week Follow Up Recommendations: Other (comment) (NOTE: PT. NOW THINKING SNF) Equipment Recommended: Defer to next venue OT Goals Acute Rehab OT Goals Time For Goal Achievement: 7 days ADL Goals ADL Goal: Grooming - Progress: Progressing toward goals ADL Goal: Toilet Transfer - Progress: Progressing toward goals ADL Goal: Toileting - Clothing Manipulation - Progress: Progressing toward goals ADL Goal: Toileting - Hygiene - Progress: Progressing toward goals Miscellaneous OT Goals OT Goal: Miscellaneous Goal #1 - Progress: Progressing toward goals OT Goal: Miscellaneous Goal #2 - Progress: Progressing toward goals  OT Treatment Precautions/Restrictions  Precautions Precautions: Back;Fall Precaution Comments: cues to maintain precuations with functional activities Required Braces or Orthoses: Yes Spinal Brace: Lumbar corset Restrictions Weight Bearing Restrictions: No   ADL ADL Grooming: Performed;Wash/dry hands;Teeth care;Brushing hair;Minimal assistance Grooming Details (indicate cue type and reason): cues for maintaining precautions during tasks (ie: not arching with brushing hair) Where Assessed - Grooming: Standing at sink Upper Body Dressing: Performed;Minimal assistance Upper Body Dressing Details (indicate cue type and reason): don/doff brace Where Assessed - Upper Body Dressing: Sitting, bed;Unsupported Toilet Transfer: Performed;Minimal assistance Toilet Transfer Method: Network engineer: Regular height toilet Toileting - Clothing Manipulation: Performed;Supervision/safety Where Assessed - Toileting Clothing Manipulation: Sit to stand from 3-in-1 or toilet Toileting - Hygiene: Performed;Supervision/safety Where Assessed - Toileting Hygiene: Sit on 3-in-1 or toilet Equipment Used: Rolling walker Ambulation Related to ADLs: cues to maintain walker safety with ambulation, pt. tends to push walker to the side and try to walk without it Mobility  Bed Mobility Bed Mobility: Yes Sit to Supine: 4: Min assist;HOB flat;With rail Sit to Supine - Details (indicate cue type and reason): min a with B les, and cues to maintain back precautions, tried to reach behind her for rail without completing roll first Transfers Transfers: Yes Sit to Stand: 5: Supervision;With upper extremity assist;From bed;From chair/3-in-1;From toilet Stand to Sit: 5: Supervision;With upper extremity assist;To bed;To chair/3-in-1;To toilet Exercises    End of Session OT - End of Session Equipment Utilized During Treatment: Back brace Activity Tolerance: Patient tolerated treatment well;Other (comment) (cont. to say "I do it later" mod. encouragement to participa) Patient left: in bed;with call bell in reach General Behavior During Session: [facility] for tasks performed Cognition: [facility] for tasks performed  Robet Leu COTA/L 03/01/2012, 12:04 PM

## 2012-03-01 NOTE — Progress Notes (Signed)
Patient was seen having leg spasms after taking all her muscle relaxants , MD was notified about that and the action was that patient should resume her valium which was held by the on- call MD over the weekend. No complaints so far , observing for confusions . Will continue to monitor.

## 2012-03-01 NOTE — Progress Notes (Signed)
Clinical Social Work Department BRIEF PSYCHOSOCIAL ASSESSMENT 03/01/2012  Patient:  Kathryn Ryan, Kathryn Ryan     Account Number:  [id number]     Admit date:  02/26/2012  Clinical Social Worker:  Peggyann Shoals  Date/Time:  03/01/2012 12:00 N  Referred by:  Physician  Date Referred:  02/29/2012 Referred for  SNF Placement   Other Referral:   Interview type:  Patient Other interview type:    PSYCHOSOCIAL DATA Living Status:  ALONE Admitted from facility:   Level of care:   Primary support name:  Valeda Malm Primary support relationship to patient:  CHILD, ADULT Degree of support available:   Very supportive, however lives in [place], [place]. Contact information is [phone].    Pt has a friend, Ladonna Snide, who is local and is able to assist. Contact infor is [phone]    CURRENT CONCERNS Current Concerns  Post-Acute Placement   Other Concerns:    SOCIAL WORK ASSESSMENT / PLAN CSW met with pt to address consult. CSW introduced herself and explained role of social work. Currently, PT is recommending SNF at discharge. Pt is agreeable to SNF. Pt's only son is very supportive and calls everyday. Per pt, her son is able to arrange things that pt needs remotely as he lives in [place], [place].   Assessment/plan status:  Other - See comment Other assessment/ plan:   CSW will complete FL2 and send request to facilities of pt's preference, per pt request. CSW will follow up with bed offers. CSW will facilitate discharge to SNF on 03/02/12.   Information/referral to community resources:   As needed.    PATIENT'S/FAMILY'S RESPONSE TO PLAN OF CARE: Pt was very pleasant and alert and oriented. Pt is agreeable to SNF at discharge. Pt also gave permission to contact pt's son regarding discharge plan. Pt's son is also agreeable to discharge plan as well.

## 2012-03-01 NOTE — Progress Notes (Signed)
Physical Therapy Treatment Patient Details Name: Kathryn Ryan MRN: [id number] DOB: 01/09/1941 Today's Date: 03/01/2012  PT Assessment/Plan  PT - Assessment/Plan Comments on Treatment Session: Pt making ambulatory gains however continues to lack being at modified independent level needed for safe return home. Pt struggles with maintaining back precautions and with safe utilization of RW. Continue to recommend SNF.  PT Plan: Frequency remains appropriate;Discharge plan needs to be updated PT Frequency: Min 5X/week Follow Up Recommendations: Skilled nursing facility;Supervision for mobility/OOB Equipment Recommended: Defer to next venue PT Goals  Acute Rehab PT Goals Pt will go Sit to Supine/Side: with modified independence PT Goal: Sit to Supine/Side - Progress: Progressing toward goal Pt will go Sit to Stand: with supervision PT Goal: Sit to Stand - Progress: Progressing toward goal Pt will go Stand to Sit: with supervision PT Goal: Stand to Sit - Progress: Progressing toward goal Pt will Ambulate: 51 - 150 feet;with supervision;with least restrictive assistive device PT Goal: Ambulate - Progress: Progressing toward goal  PT Treatment Precautions/Restrictions  Precautions Precautions: Back;Fall Precaution Booklet Issued: Yes (comment) Precaution Comments: cues to maintain precuations with functional activities Required Braces or Orthoses: Yes Spinal Brace: Lumbar corset Restrictions Weight Bearing Restrictions: No Mobility (including Balance) Bed Mobility Bed Mobility: No (sitting EOB with bed alarm going off trying to get to toilet) Sit to Supine: 4: Min assist Sit to Supine - Details (indicate cue type and reason): Assist for bil. LEs. Repeated cues for precautions, pt with decreased ability to avoid twisting.  Transfers Transfers: Yes Sit to Stand: With upper extremity assist;From bed;4: Min assist;From toilet Sit to Stand Details (indicate cue type and reason): Assist to  initiate stand. Pt not following safety cues for UE placement and continues to pull on RW.  Stand to Sit: 4: Min assist;With upper extremity assist;To toilet;5: Supervision Stand to Sit Details: Verbal cues for safety, pt attempting to sit prior to being square with toilet. Assist for improved control of descent to toilet. Supervision sitting on bed. Ambulation/Gait Ambulation/Gait: Yes Ambulation/Gait Assistance: 5: Supervision Ambulation/Gait Assistance Details (indicate cue type and reason): Pt needs supervision with gait at this time as she continues to have difficulty with tight space negotiation, desires to leave RW behind. Pt demonstrating over confidence stating she would be fine without a RW although clearly utilizes walker for improved balance during gait. Ambulation Distance (Feet): 150 Feet Assistive device: Rolling walker Gait Pattern: Decreased stride length;Decreased hip/knee flexion - right;Decreased hip/knee flexion - left;Decreased trunk rotation;Trunk flexed;Step-through pattern Gait velocity: decreased gait speed Stairs: No (focused on increased distance)    End of Session PT - End of Session Equipment Utilized During Treatment: Gait belt Activity Tolerance: Patient limited by fatigue;Patient limited by pain Patient left: with call bell in reach;Other (comment);in bed (pt refusing chair, wants bed secondary to pain) Nurse Communication: Mobility status for ambulation General Behavior During Session: [facility] for tasks performed Cognition: [facility] for tasks performed (?decreased safety awareness)  Wilhemina Bonito 03/01/2012, 3:42 PM  Sherie Don) Carleene Mains PT, DPT Acute Rehabilitation [phone]

## 2012-03-01 NOTE — Progress Notes (Signed)
Patient ID: Kathryn Ryan, female   DOB: [birth year], 71 y.o.   MRN: [id number] Subjective:  The patient is alert and pleasant. She looks well. She wants to go to a skilled nursing facility for recuperation.  Objective: Vital signs in last 24 hours: Temp:  [98.2 F (36.8 C)-99.5 F (37.5 C)] 98.2 F (36.8 C) (03/25 1000) Pulse Rate:  [90-102] 90  (03/25 1000) Resp:  [18-20] 18  (03/25 1000) BP: (86-125)/(53-71) 120/66 mmHg (03/25 1000) SpO2:  [89 %-93 %] 93 % (03/25 1000)  Intake/Output from previous day: 03/24 0701 - 03/25 0700 In: 360 [P.O.:360] Out: -  Intake/Output this shift: Total I/O In: 360 [P.O.:360] Out: -   Physical exam the patient is alert and pleasant. Her strength is normal.  Lab Results: No results found for this basename: WBC:2,HGB:2,HCT:2,PLT:2 in the last 72 hours BMET No results found for this basename: NA:2,K:2,CL:2,CO2:2,GLUCOSE:2,BUN:2,CREATININE:2,CALCIUM:2 in the last 72 hours  Studies/Results: No results found.  Assessment/Plan: Postop day 4: The patient is doing well and ready for skilled nursing facility placement.  LOS: 4 days     Otila Starn D 03/01/2012, 10:18 AM

## 2012-03-02 NOTE — Progress Notes (Signed)
CSW met with pt to provide bed offers. Pt has accepted the bed offer from [facility]. Facility is able to accept pt today or tomorrow. CSW will continue to follow to facilitate a discharge to [facility].   Dede Query, MSW, Theresia Majors [phone]

## 2012-03-02 NOTE — Progress Notes (Signed)
Patient ID: Kathryn Ryan, female   DOB: 04/07/1941, 71 y.o.   MRN: [id number] Subjective:  The patient is alert and pleasant. She is ready for nursing home placement/rehabilitation.  Objective: Vital signs in last 24 hours: Temp:  [98.2 F (36.8 C)-100.4 F (38 C)] 98.3 F (36.8 C) (03/26 0142) Pulse Rate:  [90-102] 95  (03/26 0142) Resp:  [18] 18  (03/26 0142) BP: (120-150)/(61-80) 126/61 mmHg (03/26 0142) SpO2:  [92 %-94 %] 92 % (03/26 0142)  Intake/Output from previous day: 03/25 0701 - 03/26 0700 In: 1120 [P.O.:1120] Out: -  Intake/Output this shift:    Physical exam the patient is alert and oriented. She is moving all 4 extremities well.  Lab Results: No results found for this basename: WBC:2,HGB:2,HCT:2,PLT:2 in the last 72 hours BMET No results found for this basename: NA:2,K:2,CL:2,CO2:2,GLUCOSE:2,BUN:2,CREATININE:2,CALCIUM:2 in the last 72 hours  Studies/Results: No results found.  Assessment/Plan: Postop day #5: The patient is ready for transfer to the skilled nursing facility.  LOS: 5 days     Abbigayle Toole D 03/02/2012, 7:29 AM

## 2012-03-02 NOTE — Progress Notes (Signed)
PT Cancellation Note  Pt sleeping this morning, declined ambulation or to get to chair for breakfast. Will attempt treatment later today if time allows.   Alaura Schippers (Beverely Pace) Carleene Mains PT, DPT Acute Rehabilitation [phone]

## 2012-03-03 DIAGNOSIS — M4800 Spinal stenosis, site unspecified: Secondary | ICD-10-CM | POA: Diagnosis not present

## 2012-03-03 DIAGNOSIS — I10 Essential (primary) hypertension: Secondary | ICD-10-CM | POA: Diagnosis not present

## 2012-03-03 DIAGNOSIS — M6281 Muscle weakness (generalized): Secondary | ICD-10-CM | POA: Diagnosis not present

## 2012-03-03 DIAGNOSIS — M5137 Other intervertebral disc degeneration, lumbosacral region: Secondary | ICD-10-CM | POA: Diagnosis not present

## 2012-03-03 DIAGNOSIS — K219 Gastro-esophageal reflux disease without esophagitis: Secondary | ICD-10-CM | POA: Diagnosis not present

## 2012-03-03 DIAGNOSIS — F329 Major depressive disorder, single episode, unspecified: Secondary | ICD-10-CM | POA: Diagnosis not present

## 2012-03-03 DIAGNOSIS — G2581 Restless legs syndrome: Secondary | ICD-10-CM | POA: Diagnosis not present

## 2012-03-03 DIAGNOSIS — E039 Hypothyroidism, unspecified: Secondary | ICD-10-CM | POA: Diagnosis not present

## 2012-03-03 DIAGNOSIS — K5900 Constipation, unspecified: Secondary | ICD-10-CM | POA: Diagnosis not present

## 2012-03-03 DIAGNOSIS — IMO0001 Reserved for inherently not codable concepts without codable children: Secondary | ICD-10-CM | POA: Diagnosis not present

## 2012-03-03 DIAGNOSIS — R262 Difficulty in walking, not elsewhere classified: Secondary | ICD-10-CM | POA: Diagnosis not present

## 2012-03-03 DIAGNOSIS — Z882 Allergy status to sulfonamides status: Secondary | ICD-10-CM | POA: Diagnosis not present

## 2012-03-03 DIAGNOSIS — M549 Dorsalgia, unspecified: Secondary | ICD-10-CM | POA: Diagnosis not present

## 2012-03-03 NOTE — Progress Notes (Signed)
Utilization review completed. Miguelina Fore, RN, BSN. 03/03/12  

## 2012-03-03 NOTE — Progress Notes (Signed)
Clinical Social Work Department CLINICAL SOCIAL WORK PLACEMENT NOTE 03/03/2012  Patient:  Kathryn Ryan, Kathryn Ryan  Account Number:  [id number] Admit date:  02/26/2012  Clinical Social Worker:  Peggyann Shoals  Date/time:  03/03/2012 10:20 AM  Clinical Social Work is seeking post-discharge placement for this patient at the following level of care:   SKILLED NURSING   (*CSW will update this form in Epic as items are completed)   03/01/2012  Patient/family provided with [facility] Department of Clinical Social Work's list of facilities offering this level of care within the geographic area requested by the patient (or if unable, by the patient's family).  03/01/2012  Patient/family informed of their freedom to choose among providers that offer the needed level of care, that participate in Medicare, Medicaid or managed care program needed by the patient, have an available bed and are willing to accept the patient.  03/01/2012  Patient/family informed of [facility]' ownership interest in [facility], as well as of the fact that they are under no obligation to receive care at this facility.  PASARR submitted to EDS on 03/01/2012 PASARR number received from [employer] on 03/02/2012  FL2 transmitted to all facilities in geographic area requested by pt/family on  03/01/2012 FL2 transmitted to all facilities within larger geographic area on   Patient informed that his/her managed care company has contracts with or will negotiate with  certain facilities, including the following:     Patient/family informed of bed offers received:  03/02/2012 Patient chooses bed at [facility] Physician recommends and patient chooses bed at  [facility]  Patient to be transferred to [facility] on  03/03/2012 Patient to be transferred to facility by [facility]  The following physician request were entered in Epic:   Additional Comments:

## 2012-03-03 NOTE — Progress Notes (Signed)
Pt is ready for discharge to [facility]. Facility has received discharge summary and is ready to accept pt. Pt and pt's family are agreeable to discharge plan. [facility] will provide transportation. CSW is signing off as no further clinical social work needs identified.   Dede Query, MSW, Theresia Majors [phone]

## 2012-03-03 NOTE — Progress Notes (Signed)
Physical Therapy Treatment Patient Details Name: Kathryn Ryan MRN: [id number] DOB: 1941-09-10 Today's Date: 03/03/2012  PT Assessment/Plan  PT - Assessment/Plan Comments on Treatment Session:  Pt continues to struggle with maintaining back precautions and with safe utilization of RW. Pt continues to need assist with transition and will need SNF prior to safe return home PT Plan: Frequency remains appropriate;Discharge plan needs to be updated PT Frequency: Min 5X/week Follow Up Recommendations: Skilled nursing facility;Supervision for mobility/OOB Equipment Recommended: Defer to next venue PT Goals  Acute Rehab PT Goals Pt will go Supine/Side to Sit: with modified independence PT Goal: Supine/Side to Sit - Progress: Progressing toward goal Pt will go Sit to Stand: with supervision PT Goal: Sit to Stand - Progress: Progressing toward goal Pt will go Stand to Sit: with supervision PT Goal: Stand to Sit - Progress: Progressing toward goal Pt will Transfer Bed to Chair/Chair to Bed: with supervision PT Transfer Goal: Bed to Chair/Chair to Bed - Progress: Progressing toward goal Pt will Ambulate: 51 - 150 feet;with supervision;with least restrictive assistive device PT Goal: Ambulate - Progress: Met Pt will Go Up / Down Stairs: 6-9 stairs;with supervision;with rail(s) PT Goal: Up/Down Stairs - Progress: Progressing toward goal  PT Treatment Precautions/Restrictions  Precautions Precautions: Back;Fall Precaution Booklet Issued: Yes (comment) Precaution Comments: cues to maintain precuations with functional activities Required Braces or Orthoses: Yes (when out of bed for long periods of time, RN and patient rep) Spinal Brace: Lumbar corset Restrictions Weight Bearing Restrictions: No Mobility (including Balance) Bed Mobility Rolling Left: 4: Min assist;With rail Rolling Left Details (indicate cue type and reason): Verbal and tactile cues for log roll, initiation. Assist through  Bil. LEs Left Sidelying to Sit: 4: Min assist;With rails;HOB flat Left Sidelying to Sit Details (indicate cue type and reason): Assist secondary to pain, decreased ability to adhere to precautions.  Sitting - Scoot to Edge of Bed: 6: Modified independent (Device/Increase time) Sit to Supine: 5: Supervision;HOB flat Transfers Transfers: Yes Sit to Stand: 4: Min assist;From toilet;With upper extremity assist;From bed Sit to Stand Details (indicate cue type and reason): Assist secondary to weakness. Verbal cues again required for safe UE placement Stand to Sit: To toilet;Without upper extremity assist;4: Min assist;To chair/3-in-1 (use of grab bar) Stand to Sit Details: Min assist to lower to toilet safely. Supervision sitting in chair.  Ambulation/Gait Ambulation/Gait: Yes Ambulation/Gait Assistance: 5: Supervision Ambulation/Gait Assistance Details (indicate cue type and reason): Pt needs supervision with gait she continues to have difficulty with tight space negotiation, desires to leave RW behind. Pt demonstrating improved understanding of need for RW during gait in hallway Ambulation Distance (Feet): 175 Feet Assistive device: Rolling walker Gait Pattern: Step-through pattern;Decreased stride length;Trunk flexed Stairs: Yes Stairs Assistance: Other (comment) (min-guard assist) Stairs Assistance Details (indicate cue type and reason): VC for proper sequencing and safety on stairs. Assist for stability  Stair Management Technique: Two rails;Step to pattern Number of Stairs: 5     End of Session PT - End of Session Equipment Utilized During Treatment: Gait belt;Back brace Activity Tolerance: Patient limited by fatigue;Patient limited by pain Patient left: with call bell in reach;Other (comment);in chair Nurse Communication: Mobility status for ambulation General Behavior During Session: [facility] for tasks performed Cognition: [facility] for tasks performed (?decreased safety  awareness)  Wilhemina Bonito 03/03/2012, 1:23 PM  Sherie Don) Carleene Mains PT, DPT Acute Rehabilitation [phone]

## 2012-03-03 NOTE — Discharge Summary (Signed)
Physician Discharge Summary  Patient ID: Kathryn Ryan MRN: [id number] DOB/AGE: 09/05/1941 71 y.o.  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 *    Discharged Condition: good  Hospital Course: I admitted the patient to [facility] on 02/26/2012. On the day of admission I performed exploration of her lumbar fusion with an L3-4 decompression agitation and fusion. The surgery went well. The patient's postop course is unremarkable. We had PT and OT see the patient. The patient wanted to go to a skilled nursing facility. Arrangements were made for her to go to [facility]. She was transferred on 03/03/2012.  Consults: None Significant Diagnostic Studies: None Treatments: L3-4 decompression, instrumentation, and fusion Discharge Exam: Blood pressure 118/76, pulse 79, temperature 97.8 F (36.6 C), temperature source Oral, resp. rate 20, height 5\' 7"  (1.702 m), weight 107.1 kg (236 lb 1.8 oz), SpO2 95.00%. The patient is alert and oriented. Her incision is healing well. Her strength is normal in her lower extremities.  Disposition: [facility]  Discharge Orders    Future Orders Please Complete By Expires   Diet - low sodium heart healthy      Home Health      Questions: Responses:   To provide the following care/treatments PT    OT   Face-to-face encounter      Comments:   I STERN,JOSEPH D certify that this patient is under my care and that I, or a nurse practitioner or physician's assistant working with me, had a face-to-face encounter that meets the physician face-to-face encounter requirements with this patient on 02/29/2012.       Questions: Responses:   The encounter with the patient was in whole, or in part, for the following medical condition, which is the primary reason  for home health care yes   I certify that, based on my findings, the following services are medically necessary home health services Physical therapy   My clinical findings support the need for the above services Post Surgical with wound care needs   Further, I certify that my clinical findings support that this patient is homebound (i.e. absences from home require considerable and taxing effort and are for medical reasons or religious services or infrequently or of short duration when for other reasons) Ambulates short distances less than 300 feet   To provide the following care/treatments PT    OT   Increase activity slowly      Discharge instructions      Comments:   Call [phone] for followup appointment.   No dressing needed      Call MD for:  temperature >100.4      Call MD for:  persistant nausea and vomiting      Call MD for:  severe uncontrolled pain      Call MD for:  redness, tenderness, or signs of infection (pain, swelling, redness, odor or green/yellow discharge around incision site)      Call MD for:  difficulty breathing, headache or visual disturbances      Call MD for:  hives      Call MD for:  persistant dizziness or light-headedness      Call MD for:  extreme fatigue        Medication List  As of 03/03/2012  8:33 AM   STOP taking these medications  ibuprofen 200 MG tablet         TAKE these medications         CALCIUM + D PO   Take 1 tablet by mouth daily.      COLACE PO   Take 5 capsules by mouth daily.      diazepam 5 MG tablet   Commonly known as: VALIUM   Take 1 tablet (5 mg total) by mouth every 6 (six) hours as needed (muscle spasms).      esomeprazole 40 MG capsule   Commonly known as: NEXIUM   Take 40 mg by mouth daily before breakfast.      estrogens (conjugated) 1.25 MG tablet   Commonly known as: PREMARIN   Take 1.25 mg by mouth daily.      fentaNYL 100 MCG/HR   Commonly known as: DURAGESIC - dosed mcg/hr   Place 2 patches  onto the skin every 3 (three) days.      gabapentin 800 MG tablet   Commonly known as: NEURONTIN   Take 800 mg by mouth 4 (four) times daily.      HYDROcodone-acetaminophen 5-500 MG per tablet   Commonly known as: VICODIN   Take 2 tablets by mouth every 6 (six) hours as needed. For pain      HYDROcodone-acetaminophen 5-325 MG per tablet   Commonly known as: NORCO   Take 1-2 tablets by mouth every 4 (four) hours as needed for pain.      levothyroxine 50 MCG tablet   Commonly known as: SYNTHROID, LEVOTHROID   Take 50 mcg by mouth daily.      lisinopril-hydrochlorothiazide 20-25 MG per tablet   Commonly known as: PRINZIDE,ZESTORETIC   Take 1 tablet by mouth daily.      mulitivitamin with minerals Tabs   Take 1 tablet by mouth daily.      PRISTIQ 50 MG 24 hr tablet   Generic drug: desvenlafaxine   Take 50 mg by mouth daily.             SignedCristi Loron 03/03/2012, 8:33 AM

## 2012-03-03 NOTE — Progress Notes (Signed)
Occupational Therapy Treatment and Discharge Patient Details Name: Kathryn Ryan MRN: [id number] DOB: Sep 08, 1941 Today's Date: 03/03/2012  OT Assessment/Plan OT Assessment/Plan Comments on Treatment Session: initially patient declined session yet was VERY motivated to get a shower therefore patient participated. OT Plan:  (Patient scheduled for discharge today to SNF) Follow Up Recommendations: Other (comment) (patient now prefers SNF) Equipment Recommended: Defer to next venue OT Goals ADL Goals Pt Will Perform Grooming: with modified independence;Standing at sink ADL Goal: Grooming - Progress: Progressing toward goals Pt Will Perform Lower Body Dressing: with modified independence;Sit to stand from chair;Sit to stand from bed;with adaptive equipment ADL Goal: Lower Body Dressing - Progress: Progressing toward goals Pt Will Transfer to Toilet: with modified independence;with DME;Ambulation;3-in-1 ADL Goal: Toilet Transfer - Progress: Progressing toward goals Pt Will Perform Toileting - Clothing Manipulation: with modified independence;Standing;Sitting on 3-in-1 or toilet ADL Goal: Toileting - Clothing Manipulation - Progress: Progressing toward goals Pt Will Perform Toileting - Hygiene: with modified independence;Standing at 3-in-1/toilet;Sit to stand from 3-in-1/toilet;Sitting on 3-in-1 or toilet ADL Goal: Toileting - Hygiene - Progress: Progressing toward goals Pt Will Perform Tub/Shower Transfer: Shower transfer;with modified independence;Ambulation;with DME ADL Goal: Tub/Shower Transfer - Progress: Progressing toward goals  OT Treatment Precautions/Restrictions  Precautions Precautions: Back;Fall Precaution Comments: cues to maintain precuations with functional activities Required Braces or Orthoses: Yes (when out of bed for long periods of time, RN and patient rep) Spinal Brace: Lumbar corset   ADL ADL Grooming: Wash/dry face;Wash/dry hands;Performed;Set up Grooming  Details (indicate cue type and reason): cues for maintaining precautions during tasks (ie: not arching with brushing hair) Where Assessed - Grooming: Supported (sit in shower) Upper Body Bathing: Performed;Chest;Right arm;Left arm;Abdomen;Supervision/safety;Set up Upper Body Bathing Details (indicate cue type and reason): cues for no BAT Where Assessed - Upper Body Bathing: Supported;Sitting in shower Lower Body Bathing: Performed;Minimal assistance Lower Body Bathing Details (indicate cue type and reason): Pt unable to cross ankles over knees in order to reach right foot painfree and safely. Where Assessed - Lower Body Bathing: Supported;Sitting in shower;Standing in shower Upper Body Dressing: Set up Where Assessed - Upper Body Dressing: Standing (hospital gown) Lower Body Dressing: Performed;Supervision/safety;Set up (doff/donn gripper socks only) Lower Body Dressing Details (indicate cue type and reason): Patient propped LE to the side while seted EOB to safely donn socks Where Assessed - Lower Body Dressing: Sitting, bed Toilet Transfer: Performed;Minimal assistance Toilet Transfer Details (indicate cue type and reason): min assist for maneuvering RW during turns Toilet Transfer Method: Ambulating Toilet Transfer Equipment: Regular height toilet;Grab bars Toileting - Clothing Manipulation: Performed;Supervision/safety Where Assessed - Toileting Clothing Manipulation: Sit to stand from 3-in-1 or toilet Toileting - Hygiene: Performed;Supervision/safety Where Assessed - Toileting Hygiene: Sit on 3-in-1 or toilet Tub/Shower Transfer: Performed;Minimal assistance Tub/Shower Transfer Method: Ambulating;Stand pivot Psychologist, educational: Shower seat with back;Grab bars Equipment Used: Rolling walker Ambulation Related to ADLs: cues to maintain walker safety with ambulation, pt. tends to push walker to the side and try to walk without it ADL Comments: Patient required mod verbal cues  for back precautions throught all functional mobility and during BADL tasks Mobility  Bed Mobility Rolling Left: 4: Min assist;With rail Left Sidelying to Sit: 4: Min assist;With rails;HOB flat Sitting - Scoot to Edge of Bed: 6: Modified independent (Device/Increase time) Sit to Supine: 5: Supervision;HOB flat Transfers Transfers: Yes Sit to Stand: 4: Min assist;From toilet;With upper extremity assist (supervision from bed) Stand to Sit: 5: Supervision;To toilet;To bed;Without upper extremity assist (use of grab  bar)  Pleas Patricia, MS, OTR/L Occupational Therapist Pager [phone]  03/03/2012, 10:06 AM

## 2012-03-12 DIAGNOSIS — Z4789 Encounter for other orthopedic aftercare: Secondary | ICD-10-CM | POA: Diagnosis not present

## 2012-03-12 DIAGNOSIS — F329 Major depressive disorder, single episode, unspecified: Secondary | ICD-10-CM | POA: Diagnosis not present

## 2012-03-12 DIAGNOSIS — M545 Low back pain: Secondary | ICD-10-CM | POA: Diagnosis not present

## 2012-03-12 DIAGNOSIS — G609 Hereditary and idiopathic neuropathy, unspecified: Secondary | ICD-10-CM | POA: Diagnosis not present

## 2012-03-12 DIAGNOSIS — I10 Essential (primary) hypertension: Secondary | ICD-10-CM | POA: Diagnosis not present

## 2012-03-15 DIAGNOSIS — I10 Essential (primary) hypertension: Secondary | ICD-10-CM | POA: Diagnosis not present

## 2012-03-15 DIAGNOSIS — Z4789 Encounter for other orthopedic aftercare: Secondary | ICD-10-CM | POA: Diagnosis not present

## 2012-03-15 DIAGNOSIS — M545 Low back pain: Secondary | ICD-10-CM | POA: Diagnosis not present

## 2012-03-15 DIAGNOSIS — F329 Major depressive disorder, single episode, unspecified: Secondary | ICD-10-CM | POA: Diagnosis not present

## 2012-03-15 DIAGNOSIS — G609 Hereditary and idiopathic neuropathy, unspecified: Secondary | ICD-10-CM | POA: Diagnosis not present

## 2012-03-16 DIAGNOSIS — G609 Hereditary and idiopathic neuropathy, unspecified: Secondary | ICD-10-CM | POA: Diagnosis not present

## 2012-03-16 DIAGNOSIS — I10 Essential (primary) hypertension: Secondary | ICD-10-CM | POA: Diagnosis not present

## 2012-03-16 DIAGNOSIS — Z4789 Encounter for other orthopedic aftercare: Secondary | ICD-10-CM | POA: Diagnosis not present

## 2012-03-16 DIAGNOSIS — M545 Low back pain: Secondary | ICD-10-CM | POA: Diagnosis not present

## 2012-03-16 DIAGNOSIS — F329 Major depressive disorder, single episode, unspecified: Secondary | ICD-10-CM | POA: Diagnosis not present

## 2012-03-18 DIAGNOSIS — M545 Low back pain: Secondary | ICD-10-CM | POA: Diagnosis not present

## 2012-03-18 DIAGNOSIS — G609 Hereditary and idiopathic neuropathy, unspecified: Secondary | ICD-10-CM | POA: Diagnosis not present

## 2012-03-18 DIAGNOSIS — F329 Major depressive disorder, single episode, unspecified: Secondary | ICD-10-CM | POA: Diagnosis not present

## 2012-03-18 DIAGNOSIS — Z4789 Encounter for other orthopedic aftercare: Secondary | ICD-10-CM | POA: Diagnosis not present

## 2012-03-18 DIAGNOSIS — I10 Essential (primary) hypertension: Secondary | ICD-10-CM | POA: Diagnosis not present

## 2012-03-21 DIAGNOSIS — M545 Low back pain: Secondary | ICD-10-CM | POA: Diagnosis not present

## 2012-03-21 DIAGNOSIS — G609 Hereditary and idiopathic neuropathy, unspecified: Secondary | ICD-10-CM | POA: Diagnosis not present

## 2012-03-21 DIAGNOSIS — Z4789 Encounter for other orthopedic aftercare: Secondary | ICD-10-CM | POA: Diagnosis not present

## 2012-03-21 DIAGNOSIS — I10 Essential (primary) hypertension: Secondary | ICD-10-CM | POA: Diagnosis not present

## 2012-03-22 DIAGNOSIS — F329 Major depressive disorder, single episode, unspecified: Secondary | ICD-10-CM | POA: Diagnosis not present

## 2012-03-22 DIAGNOSIS — M545 Low back pain: Secondary | ICD-10-CM | POA: Diagnosis not present

## 2012-03-22 DIAGNOSIS — I10 Essential (primary) hypertension: Secondary | ICD-10-CM | POA: Diagnosis not present

## 2012-03-22 DIAGNOSIS — Z4789 Encounter for other orthopedic aftercare: Secondary | ICD-10-CM | POA: Diagnosis not present

## 2012-03-22 DIAGNOSIS — G609 Hereditary and idiopathic neuropathy, unspecified: Secondary | ICD-10-CM | POA: Diagnosis not present

## 2012-03-24 DIAGNOSIS — Z4789 Encounter for other orthopedic aftercare: Secondary | ICD-10-CM | POA: Diagnosis not present

## 2012-03-24 DIAGNOSIS — F329 Major depressive disorder, single episode, unspecified: Secondary | ICD-10-CM | POA: Diagnosis not present

## 2012-03-24 DIAGNOSIS — G609 Hereditary and idiopathic neuropathy, unspecified: Secondary | ICD-10-CM | POA: Diagnosis not present

## 2012-03-24 DIAGNOSIS — I10 Essential (primary) hypertension: Secondary | ICD-10-CM | POA: Diagnosis not present

## 2012-03-24 DIAGNOSIS — M545 Low back pain: Secondary | ICD-10-CM | POA: Diagnosis not present

## 2012-03-25 DIAGNOSIS — M545 Low back pain: Secondary | ICD-10-CM | POA: Diagnosis not present

## 2012-03-25 DIAGNOSIS — K5900 Constipation, unspecified: Secondary | ICD-10-CM | POA: Diagnosis not present

## 2012-03-25 DIAGNOSIS — E669 Obesity, unspecified: Secondary | ICD-10-CM | POA: Diagnosis not present

## 2012-03-25 DIAGNOSIS — Z4789 Encounter for other orthopedic aftercare: Secondary | ICD-10-CM | POA: Diagnosis not present

## 2012-03-25 DIAGNOSIS — IMO0002 Reserved for concepts with insufficient information to code with codable children: Secondary | ICD-10-CM | POA: Diagnosis not present

## 2012-03-25 DIAGNOSIS — E782 Mixed hyperlipidemia: Secondary | ICD-10-CM | POA: Diagnosis not present

## 2012-03-25 DIAGNOSIS — F329 Major depressive disorder, single episode, unspecified: Secondary | ICD-10-CM | POA: Diagnosis not present

## 2012-03-25 DIAGNOSIS — G619 Inflammatory polyneuropathy, unspecified: Secondary | ICD-10-CM | POA: Diagnosis not present

## 2012-03-25 DIAGNOSIS — I10 Essential (primary) hypertension: Secondary | ICD-10-CM | POA: Diagnosis not present

## 2012-03-25 DIAGNOSIS — G609 Hereditary and idiopathic neuropathy, unspecified: Secondary | ICD-10-CM | POA: Diagnosis not present

## 2012-03-25 DIAGNOSIS — G622 Polyneuropathy due to other toxic agents: Secondary | ICD-10-CM | POA: Diagnosis not present

## 2012-04-26 DIAGNOSIS — R922 Inconclusive mammogram: Secondary | ICD-10-CM | POA: Diagnosis not present

## 2012-04-26 DIAGNOSIS — Z1231 Encounter for screening mammogram for malignant neoplasm of breast: Secondary | ICD-10-CM | POA: Diagnosis not present

## 2012-05-05 DIAGNOSIS — G619 Inflammatory polyneuropathy, unspecified: Secondary | ICD-10-CM | POA: Diagnosis not present

## 2012-05-05 DIAGNOSIS — M1990 Unspecified osteoarthritis, unspecified site: Secondary | ICD-10-CM | POA: Diagnosis not present

## 2012-05-05 DIAGNOSIS — M545 Low back pain: Secondary | ICD-10-CM | POA: Diagnosis not present

## 2012-05-11 DIAGNOSIS — N6009 Solitary cyst of unspecified breast: Secondary | ICD-10-CM | POA: Diagnosis not present

## 2012-05-11 DIAGNOSIS — R928 Other abnormal and inconclusive findings on diagnostic imaging of breast: Secondary | ICD-10-CM | POA: Diagnosis not present

## 2012-05-26 DIAGNOSIS — E782 Mixed hyperlipidemia: Secondary | ICD-10-CM | POA: Diagnosis not present

## 2012-05-26 DIAGNOSIS — E039 Hypothyroidism, unspecified: Secondary | ICD-10-CM | POA: Diagnosis not present

## 2012-05-26 DIAGNOSIS — E119 Type 2 diabetes mellitus without complications: Secondary | ICD-10-CM | POA: Diagnosis not present

## 2012-05-26 DIAGNOSIS — I10 Essential (primary) hypertension: Secondary | ICD-10-CM | POA: Diagnosis not present

## 2012-06-01 DIAGNOSIS — M5380 Other specified dorsopathies, site unspecified: Secondary | ICD-10-CM | POA: Diagnosis not present

## 2012-06-01 DIAGNOSIS — M48061 Spinal stenosis, lumbar region without neurogenic claudication: Secondary | ICD-10-CM | POA: Diagnosis not present

## 2012-06-01 DIAGNOSIS — M5137 Other intervertebral disc degeneration, lumbosacral region: Secondary | ICD-10-CM | POA: Diagnosis not present

## 2012-06-01 DIAGNOSIS — M47817 Spondylosis without myelopathy or radiculopathy, lumbosacral region: Secondary | ICD-10-CM | POA: Diagnosis not present

## 2012-06-02 DIAGNOSIS — E669 Obesity, unspecified: Secondary | ICD-10-CM | POA: Diagnosis not present

## 2012-06-02 DIAGNOSIS — R5383 Other fatigue: Secondary | ICD-10-CM | POA: Diagnosis not present

## 2012-06-02 DIAGNOSIS — IMO0002 Reserved for concepts with insufficient information to code with codable children: Secondary | ICD-10-CM | POA: Diagnosis not present

## 2012-06-02 DIAGNOSIS — E782 Mixed hyperlipidemia: Secondary | ICD-10-CM | POA: Diagnosis not present

## 2012-06-02 DIAGNOSIS — G619 Inflammatory polyneuropathy, unspecified: Secondary | ICD-10-CM | POA: Diagnosis not present

## 2012-06-02 DIAGNOSIS — I10 Essential (primary) hypertension: Secondary | ICD-10-CM | POA: Diagnosis not present

## 2012-06-02 DIAGNOSIS — K5900 Constipation, unspecified: Secondary | ICD-10-CM | POA: Diagnosis not present

## 2012-06-02 DIAGNOSIS — M545 Low back pain: Secondary | ICD-10-CM | POA: Diagnosis not present

## 2012-06-08 DIAGNOSIS — M5137 Other intervertebral disc degeneration, lumbosacral region: Secondary | ICD-10-CM | POA: Diagnosis not present

## 2012-06-08 DIAGNOSIS — Z981 Arthrodesis status: Secondary | ICD-10-CM | POA: Diagnosis not present

## 2012-06-08 DIAGNOSIS — M1990 Unspecified osteoarthritis, unspecified site: Secondary | ICD-10-CM | POA: Diagnosis not present

## 2012-06-08 DIAGNOSIS — M51379 Other intervertebral disc degeneration, lumbosacral region without mention of lumbar back pain or lower extremity pain: Secondary | ICD-10-CM | POA: Diagnosis not present

## 2012-06-08 DIAGNOSIS — M47817 Spondylosis without myelopathy or radiculopathy, lumbosacral region: Secondary | ICD-10-CM | POA: Diagnosis not present

## 2012-06-08 DIAGNOSIS — IMO0001 Reserved for inherently not codable concepts without codable children: Secondary | ICD-10-CM | POA: Diagnosis not present

## 2012-06-08 DIAGNOSIS — G5790 Unspecified mononeuropathy of unspecified lower limb: Secondary | ICD-10-CM | POA: Diagnosis not present

## 2012-06-08 DIAGNOSIS — Z79899 Other long term (current) drug therapy: Secondary | ICD-10-CM | POA: Diagnosis not present

## 2012-06-08 DIAGNOSIS — M961 Postlaminectomy syndrome, not elsewhere classified: Secondary | ICD-10-CM | POA: Diagnosis not present

## 2012-06-18 DIAGNOSIS — E039 Hypothyroidism, unspecified: Secondary | ICD-10-CM | POA: Diagnosis not present

## 2012-06-18 DIAGNOSIS — M5380 Other specified dorsopathies, site unspecified: Secondary | ICD-10-CM | POA: Diagnosis not present

## 2012-06-18 DIAGNOSIS — K219 Gastro-esophageal reflux disease without esophagitis: Secondary | ICD-10-CM | POA: Diagnosis not present

## 2012-06-18 DIAGNOSIS — M810 Age-related osteoporosis without current pathological fracture: Secondary | ICD-10-CM | POA: Diagnosis not present

## 2012-06-18 DIAGNOSIS — I10 Essential (primary) hypertension: Secondary | ICD-10-CM | POA: Diagnosis not present

## 2012-06-18 DIAGNOSIS — IMO0001 Reserved for inherently not codable concepts without codable children: Secondary | ICD-10-CM | POA: Diagnosis not present

## 2012-06-18 DIAGNOSIS — M5137 Other intervertebral disc degeneration, lumbosacral region: Secondary | ICD-10-CM | POA: Diagnosis not present

## 2012-06-18 DIAGNOSIS — Z882 Allergy status to sulfonamides status: Secondary | ICD-10-CM | POA: Diagnosis not present

## 2012-06-18 DIAGNOSIS — G609 Hereditary and idiopathic neuropathy, unspecified: Secondary | ICD-10-CM | POA: Diagnosis not present

## 2012-06-18 DIAGNOSIS — M961 Postlaminectomy syndrome, not elsewhere classified: Secondary | ICD-10-CM | POA: Diagnosis not present

## 2012-06-18 DIAGNOSIS — K5900 Constipation, unspecified: Secondary | ICD-10-CM | POA: Diagnosis not present

## 2012-06-30 DIAGNOSIS — M5380 Other specified dorsopathies, site unspecified: Secondary | ICD-10-CM | POA: Diagnosis not present

## 2012-06-30 DIAGNOSIS — M48061 Spinal stenosis, lumbar region without neurogenic claudication: Secondary | ICD-10-CM | POA: Diagnosis not present

## 2012-06-30 DIAGNOSIS — M5137 Other intervertebral disc degeneration, lumbosacral region: Secondary | ICD-10-CM | POA: Diagnosis not present

## 2012-06-30 DIAGNOSIS — M47817 Spondylosis without myelopathy or radiculopathy, lumbosacral region: Secondary | ICD-10-CM | POA: Diagnosis not present

## 2012-07-02 DIAGNOSIS — F329 Major depressive disorder, single episode, unspecified: Secondary | ICD-10-CM | POA: Diagnosis not present

## 2012-07-02 DIAGNOSIS — Z981 Arthrodesis status: Secondary | ICD-10-CM | POA: Diagnosis not present

## 2012-07-02 DIAGNOSIS — F411 Generalized anxiety disorder: Secondary | ICD-10-CM | POA: Diagnosis not present

## 2012-07-02 DIAGNOSIS — G5790 Unspecified mononeuropathy of unspecified lower limb: Secondary | ICD-10-CM | POA: Diagnosis not present

## 2012-07-02 DIAGNOSIS — G5690 Unspecified mononeuropathy of unspecified upper limb: Secondary | ICD-10-CM | POA: Diagnosis not present

## 2012-07-02 DIAGNOSIS — M5380 Other specified dorsopathies, site unspecified: Secondary | ICD-10-CM | POA: Diagnosis not present

## 2012-07-02 DIAGNOSIS — I10 Essential (primary) hypertension: Secondary | ICD-10-CM | POA: Diagnosis not present

## 2012-07-02 DIAGNOSIS — Z882 Allergy status to sulfonamides status: Secondary | ICD-10-CM | POA: Diagnosis not present

## 2012-07-02 DIAGNOSIS — K219 Gastro-esophageal reflux disease without esophagitis: Secondary | ICD-10-CM | POA: Diagnosis not present

## 2012-07-02 DIAGNOSIS — M5137 Other intervertebral disc degeneration, lumbosacral region: Secondary | ICD-10-CM | POA: Diagnosis not present

## 2012-07-02 DIAGNOSIS — M810 Age-related osteoporosis without current pathological fracture: Secondary | ICD-10-CM | POA: Diagnosis not present

## 2012-07-02 DIAGNOSIS — E039 Hypothyroidism, unspecified: Secondary | ICD-10-CM | POA: Diagnosis not present

## 2012-08-18 DIAGNOSIS — M47817 Spondylosis without myelopathy or radiculopathy, lumbosacral region: Secondary | ICD-10-CM | POA: Diagnosis not present

## 2012-08-18 DIAGNOSIS — IMO0002 Reserved for concepts with insufficient information to code with codable children: Secondary | ICD-10-CM | POA: Diagnosis not present

## 2012-08-18 DIAGNOSIS — M48061 Spinal stenosis, lumbar region without neurogenic claudication: Secondary | ICD-10-CM | POA: Diagnosis not present

## 2012-08-18 DIAGNOSIS — M5137 Other intervertebral disc degeneration, lumbosacral region: Secondary | ICD-10-CM | POA: Diagnosis not present

## 2012-09-10 DIAGNOSIS — E782 Mixed hyperlipidemia: Secondary | ICD-10-CM | POA: Diagnosis not present

## 2012-09-10 DIAGNOSIS — E039 Hypothyroidism, unspecified: Secondary | ICD-10-CM | POA: Diagnosis not present

## 2012-09-10 DIAGNOSIS — I10 Essential (primary) hypertension: Secondary | ICD-10-CM | POA: Diagnosis not present

## 2012-09-21 DIAGNOSIS — G622 Polyneuropathy due to other toxic agents: Secondary | ICD-10-CM | POA: Diagnosis not present

## 2012-09-21 DIAGNOSIS — IMO0002 Reserved for concepts with insufficient information to code with codable children: Secondary | ICD-10-CM | POA: Diagnosis not present

## 2012-09-21 DIAGNOSIS — M545 Low back pain: Secondary | ICD-10-CM | POA: Diagnosis not present

## 2012-09-21 DIAGNOSIS — E782 Mixed hyperlipidemia: Secondary | ICD-10-CM | POA: Diagnosis not present

## 2012-09-21 DIAGNOSIS — I10 Essential (primary) hypertension: Secondary | ICD-10-CM | POA: Diagnosis not present

## 2012-09-21 DIAGNOSIS — E669 Obesity, unspecified: Secondary | ICD-10-CM | POA: Diagnosis not present

## 2012-09-21 DIAGNOSIS — Z23 Encounter for immunization: Secondary | ICD-10-CM | POA: Diagnosis not present

## 2012-09-22 DIAGNOSIS — IMO0002 Reserved for concepts with insufficient information to code with codable children: Secondary | ICD-10-CM | POA: Diagnosis not present

## 2012-09-22 DIAGNOSIS — M48061 Spinal stenosis, lumbar region without neurogenic claudication: Secondary | ICD-10-CM | POA: Diagnosis not present

## 2012-09-22 DIAGNOSIS — M5137 Other intervertebral disc degeneration, lumbosacral region: Secondary | ICD-10-CM | POA: Diagnosis not present

## 2012-09-22 DIAGNOSIS — M47817 Spondylosis without myelopathy or radiculopathy, lumbosacral region: Secondary | ICD-10-CM | POA: Diagnosis not present

## 2012-10-12 DIAGNOSIS — F411 Generalized anxiety disorder: Secondary | ICD-10-CM | POA: Diagnosis not present

## 2012-10-12 DIAGNOSIS — I10 Essential (primary) hypertension: Secondary | ICD-10-CM | POA: Diagnosis not present

## 2012-10-12 DIAGNOSIS — M1990 Unspecified osteoarthritis, unspecified site: Secondary | ICD-10-CM | POA: Diagnosis not present

## 2012-10-12 DIAGNOSIS — IMO0001 Reserved for inherently not codable concepts without codable children: Secondary | ICD-10-CM | POA: Diagnosis not present

## 2012-10-12 DIAGNOSIS — M5137 Other intervertebral disc degeneration, lumbosacral region: Secondary | ICD-10-CM | POA: Diagnosis not present

## 2012-10-12 DIAGNOSIS — Z79899 Other long term (current) drug therapy: Secondary | ICD-10-CM | POA: Diagnosis not present

## 2012-10-12 DIAGNOSIS — F329 Major depressive disorder, single episode, unspecified: Secondary | ICD-10-CM | POA: Diagnosis not present

## 2012-10-12 DIAGNOSIS — Z882 Allergy status to sulfonamides status: Secondary | ICD-10-CM | POA: Diagnosis not present

## 2012-10-12 DIAGNOSIS — M51379 Other intervertebral disc degeneration, lumbosacral region without mention of lumbar back pain or lower extremity pain: Secondary | ICD-10-CM | POA: Diagnosis not present

## 2012-10-12 DIAGNOSIS — K219 Gastro-esophageal reflux disease without esophagitis: Secondary | ICD-10-CM | POA: Diagnosis not present

## 2012-10-12 DIAGNOSIS — M810 Age-related osteoporosis without current pathological fracture: Secondary | ICD-10-CM | POA: Diagnosis not present

## 2012-10-12 DIAGNOSIS — M961 Postlaminectomy syndrome, not elsewhere classified: Secondary | ICD-10-CM | POA: Diagnosis not present

## 2012-10-12 DIAGNOSIS — G609 Hereditary and idiopathic neuropathy, unspecified: Secondary | ICD-10-CM | POA: Diagnosis not present

## 2012-10-12 DIAGNOSIS — IMO0002 Reserved for concepts with insufficient information to code with codable children: Secondary | ICD-10-CM | POA: Diagnosis not present

## 2012-10-12 DIAGNOSIS — E039 Hypothyroidism, unspecified: Secondary | ICD-10-CM | POA: Diagnosis not present

## 2012-11-08 DIAGNOSIS — E039 Hypothyroidism, unspecified: Secondary | ICD-10-CM | POA: Diagnosis not present

## 2012-11-17 DIAGNOSIS — M5137 Other intervertebral disc degeneration, lumbosacral region: Secondary | ICD-10-CM | POA: Diagnosis not present

## 2012-11-17 DIAGNOSIS — IMO0002 Reserved for concepts with insufficient information to code with codable children: Secondary | ICD-10-CM | POA: Diagnosis not present

## 2012-11-17 DIAGNOSIS — M48061 Spinal stenosis, lumbar region without neurogenic claudication: Secondary | ICD-10-CM | POA: Diagnosis not present

## 2012-11-17 DIAGNOSIS — M47817 Spondylosis without myelopathy or radiculopathy, lumbosacral region: Secondary | ICD-10-CM | POA: Diagnosis not present

## 2012-11-18 DIAGNOSIS — Z981 Arthrodesis status: Secondary | ICD-10-CM | POA: Diagnosis not present

## 2012-11-18 DIAGNOSIS — M47817 Spondylosis without myelopathy or radiculopathy, lumbosacral region: Secondary | ICD-10-CM | POA: Diagnosis not present

## 2012-11-18 DIAGNOSIS — M5137 Other intervertebral disc degeneration, lumbosacral region: Secondary | ICD-10-CM | POA: Diagnosis not present

## 2012-11-29 DIAGNOSIS — M47817 Spondylosis without myelopathy or radiculopathy, lumbosacral region: Secondary | ICD-10-CM | POA: Diagnosis not present

## 2012-11-29 DIAGNOSIS — M48061 Spinal stenosis, lumbar region without neurogenic claudication: Secondary | ICD-10-CM | POA: Diagnosis not present

## 2012-11-29 DIAGNOSIS — IMO0002 Reserved for concepts with insufficient information to code with codable children: Secondary | ICD-10-CM | POA: Diagnosis not present

## 2012-11-29 DIAGNOSIS — M5137 Other intervertebral disc degeneration, lumbosacral region: Secondary | ICD-10-CM | POA: Diagnosis not present

## 2012-12-09 DIAGNOSIS — M546 Pain in thoracic spine: Secondary | ICD-10-CM | POA: Diagnosis not present

## 2012-12-09 DIAGNOSIS — M5137 Other intervertebral disc degeneration, lumbosacral region: Secondary | ICD-10-CM | POA: Diagnosis not present

## 2012-12-21 DIAGNOSIS — I10 Essential (primary) hypertension: Secondary | ICD-10-CM | POA: Diagnosis not present

## 2012-12-21 DIAGNOSIS — E782 Mixed hyperlipidemia: Secondary | ICD-10-CM | POA: Diagnosis not present

## 2012-12-21 DIAGNOSIS — IMO0001 Reserved for inherently not codable concepts without codable children: Secondary | ICD-10-CM | POA: Diagnosis not present

## 2012-12-21 DIAGNOSIS — E039 Hypothyroidism, unspecified: Secondary | ICD-10-CM | POA: Diagnosis not present

## 2012-12-22 DIAGNOSIS — M5137 Other intervertebral disc degeneration, lumbosacral region: Secondary | ICD-10-CM | POA: Diagnosis not present

## 2012-12-22 DIAGNOSIS — M961 Postlaminectomy syndrome, not elsewhere classified: Secondary | ICD-10-CM | POA: Diagnosis not present

## 2012-12-22 DIAGNOSIS — M47817 Spondylosis without myelopathy or radiculopathy, lumbosacral region: Secondary | ICD-10-CM | POA: Diagnosis not present

## 2012-12-22 DIAGNOSIS — IMO0002 Reserved for concepts with insufficient information to code with codable children: Secondary | ICD-10-CM | POA: Diagnosis not present

## 2012-12-22 DIAGNOSIS — F4542 Pain disorder with related psychological factors: Secondary | ICD-10-CM | POA: Diagnosis not present

## 2012-12-22 DIAGNOSIS — G609 Hereditary and idiopathic neuropathy, unspecified: Secondary | ICD-10-CM | POA: Diagnosis not present

## 2012-12-22 DIAGNOSIS — M48061 Spinal stenosis, lumbar region without neurogenic claudication: Secondary | ICD-10-CM | POA: Diagnosis not present

## 2012-12-22 DIAGNOSIS — F411 Generalized anxiety disorder: Secondary | ICD-10-CM | POA: Diagnosis not present

## 2012-12-28 DIAGNOSIS — G619 Inflammatory polyneuropathy, unspecified: Secondary | ICD-10-CM | POA: Diagnosis not present

## 2012-12-28 DIAGNOSIS — M545 Low back pain: Secondary | ICD-10-CM | POA: Diagnosis not present

## 2012-12-28 DIAGNOSIS — E782 Mixed hyperlipidemia: Secondary | ICD-10-CM | POA: Diagnosis not present

## 2012-12-28 DIAGNOSIS — IMO0002 Reserved for concepts with insufficient information to code with codable children: Secondary | ICD-10-CM | POA: Diagnosis not present

## 2012-12-28 DIAGNOSIS — K5900 Constipation, unspecified: Secondary | ICD-10-CM | POA: Diagnosis not present

## 2012-12-28 DIAGNOSIS — E669 Obesity, unspecified: Secondary | ICD-10-CM | POA: Diagnosis not present

## 2012-12-28 DIAGNOSIS — I10 Essential (primary) hypertension: Secondary | ICD-10-CM | POA: Diagnosis not present

## 2012-12-28 DIAGNOSIS — G622 Polyneuropathy due to other toxic agents: Secondary | ICD-10-CM | POA: Diagnosis not present

## 2013-01-13 DIAGNOSIS — Z79899 Other long term (current) drug therapy: Secondary | ICD-10-CM | POA: Diagnosis not present

## 2013-01-25 DIAGNOSIS — M810 Age-related osteoporosis without current pathological fracture: Secondary | ICD-10-CM | POA: Diagnosis not present

## 2013-01-25 DIAGNOSIS — M5137 Other intervertebral disc degeneration, lumbosacral region: Secondary | ICD-10-CM | POA: Diagnosis not present

## 2013-01-25 DIAGNOSIS — E039 Hypothyroidism, unspecified: Secondary | ICD-10-CM | POA: Diagnosis not present

## 2013-01-25 DIAGNOSIS — M1990 Unspecified osteoarthritis, unspecified site: Secondary | ICD-10-CM | POA: Diagnosis not present

## 2013-01-25 DIAGNOSIS — F329 Major depressive disorder, single episode, unspecified: Secondary | ICD-10-CM | POA: Diagnosis not present

## 2013-01-25 DIAGNOSIS — K5900 Constipation, unspecified: Secondary | ICD-10-CM | POA: Diagnosis not present

## 2013-01-25 DIAGNOSIS — K219 Gastro-esophageal reflux disease without esophagitis: Secondary | ICD-10-CM | POA: Diagnosis not present

## 2013-01-25 DIAGNOSIS — G894 Chronic pain syndrome: Secondary | ICD-10-CM | POA: Diagnosis not present

## 2013-01-25 DIAGNOSIS — F411 Generalized anxiety disorder: Secondary | ICD-10-CM | POA: Diagnosis not present

## 2013-01-25 DIAGNOSIS — IMO0001 Reserved for inherently not codable concepts without codable children: Secondary | ICD-10-CM | POA: Diagnosis not present

## 2013-01-25 DIAGNOSIS — Z882 Allergy status to sulfonamides status: Secondary | ICD-10-CM | POA: Diagnosis not present

## 2013-01-25 DIAGNOSIS — G609 Hereditary and idiopathic neuropathy, unspecified: Secondary | ICD-10-CM | POA: Diagnosis not present

## 2013-01-25 DIAGNOSIS — Z79899 Other long term (current) drug therapy: Secondary | ICD-10-CM | POA: Diagnosis not present

## 2013-01-25 DIAGNOSIS — I10 Essential (primary) hypertension: Secondary | ICD-10-CM | POA: Diagnosis not present

## 2013-01-25 DIAGNOSIS — M961 Postlaminectomy syndrome, not elsewhere classified: Secondary | ICD-10-CM | POA: Diagnosis not present

## 2013-02-16 DIAGNOSIS — IMO0002 Reserved for concepts with insufficient information to code with codable children: Secondary | ICD-10-CM | POA: Diagnosis not present

## 2013-02-16 DIAGNOSIS — M48061 Spinal stenosis, lumbar region without neurogenic claudication: Secondary | ICD-10-CM | POA: Diagnosis not present

## 2013-02-16 DIAGNOSIS — M47817 Spondylosis without myelopathy or radiculopathy, lumbosacral region: Secondary | ICD-10-CM | POA: Diagnosis not present

## 2013-02-16 DIAGNOSIS — M5137 Other intervertebral disc degeneration, lumbosacral region: Secondary | ICD-10-CM | POA: Diagnosis not present

## 2013-02-18 DIAGNOSIS — M47817 Spondylosis without myelopathy or radiculopathy, lumbosacral region: Secondary | ICD-10-CM | POA: Diagnosis not present

## 2013-02-18 DIAGNOSIS — M5137 Other intervertebral disc degeneration, lumbosacral region: Secondary | ICD-10-CM | POA: Diagnosis not present

## 2013-03-16 DIAGNOSIS — M6281 Muscle weakness (generalized): Secondary | ICD-10-CM | POA: Diagnosis not present

## 2013-03-16 DIAGNOSIS — F3289 Other specified depressive episodes: Secondary | ICD-10-CM | POA: Diagnosis not present

## 2013-03-16 DIAGNOSIS — Z882 Allergy status to sulfonamides status: Secondary | ICD-10-CM | POA: Diagnosis not present

## 2013-03-16 DIAGNOSIS — G609 Hereditary and idiopathic neuropathy, unspecified: Secondary | ICD-10-CM | POA: Diagnosis not present

## 2013-03-16 DIAGNOSIS — R262 Difficulty in walking, not elsewhere classified: Secondary | ICD-10-CM | POA: Diagnosis not present

## 2013-03-16 DIAGNOSIS — E039 Hypothyroidism, unspecified: Secondary | ICD-10-CM | POA: Diagnosis not present

## 2013-03-16 DIAGNOSIS — M47817 Spondylosis without myelopathy or radiculopathy, lumbosacral region: Secondary | ICD-10-CM | POA: Diagnosis not present

## 2013-03-16 DIAGNOSIS — Z4889 Encounter for other specified surgical aftercare: Secondary | ICD-10-CM | POA: Diagnosis not present

## 2013-03-16 DIAGNOSIS — Z01818 Encounter for other preprocedural examination: Secondary | ICD-10-CM | POA: Diagnosis not present

## 2013-03-16 DIAGNOSIS — N959 Unspecified menopausal and perimenopausal disorder: Secondary | ICD-10-CM | POA: Diagnosis not present

## 2013-03-16 DIAGNOSIS — K219 Gastro-esophageal reflux disease without esophagitis: Secondary | ICD-10-CM | POA: Diagnosis not present

## 2013-03-16 DIAGNOSIS — G5790 Unspecified mononeuropathy of unspecified lower limb: Secondary | ICD-10-CM | POA: Diagnosis present

## 2013-03-16 DIAGNOSIS — G4700 Insomnia, unspecified: Secondary | ICD-10-CM | POA: Diagnosis not present

## 2013-03-16 DIAGNOSIS — Z5189 Encounter for other specified aftercare: Secondary | ICD-10-CM | POA: Diagnosis not present

## 2013-03-16 DIAGNOSIS — M810 Age-related osteoporosis without current pathological fracture: Secondary | ICD-10-CM | POA: Diagnosis not present

## 2013-03-16 DIAGNOSIS — Z780 Asymptomatic menopausal state: Secondary | ICD-10-CM | POA: Diagnosis not present

## 2013-03-16 DIAGNOSIS — Z981 Arthrodesis status: Secondary | ICD-10-CM | POA: Diagnosis not present

## 2013-03-16 DIAGNOSIS — I10 Essential (primary) hypertension: Secondary | ICD-10-CM | POA: Diagnosis not present

## 2013-03-16 DIAGNOSIS — F411 Generalized anxiety disorder: Secondary | ICD-10-CM | POA: Diagnosis not present

## 2013-03-16 DIAGNOSIS — F329 Major depressive disorder, single episode, unspecified: Secondary | ICD-10-CM | POA: Diagnosis present

## 2013-03-16 DIAGNOSIS — M5137 Other intervertebral disc degeneration, lumbosacral region: Secondary | ICD-10-CM | POA: Diagnosis not present

## 2013-03-16 DIAGNOSIS — Z6837 Body mass index (BMI) 37.0-37.9, adult: Secondary | ICD-10-CM | POA: Diagnosis not present

## 2013-03-16 DIAGNOSIS — M51379 Other intervertebral disc degeneration, lumbosacral region without mention of lumbar back pain or lower extremity pain: Secondary | ICD-10-CM | POA: Diagnosis not present

## 2013-03-16 DIAGNOSIS — Z79899 Other long term (current) drug therapy: Secondary | ICD-10-CM | POA: Diagnosis not present

## 2013-03-16 DIAGNOSIS — Z888 Allergy status to other drugs, medicaments and biological substances status: Secondary | ICD-10-CM | POA: Diagnosis not present

## 2013-03-16 DIAGNOSIS — G2581 Restless legs syndrome: Secondary | ICD-10-CM | POA: Diagnosis not present

## 2013-03-16 DIAGNOSIS — M533 Sacrococcygeal disorders, not elsewhere classified: Secondary | ICD-10-CM | POA: Diagnosis not present

## 2013-03-16 DIAGNOSIS — E785 Hyperlipidemia, unspecified: Secondary | ICD-10-CM | POA: Diagnosis not present

## 2013-03-16 DIAGNOSIS — R69 Illness, unspecified: Secondary | ICD-10-CM | POA: Diagnosis not present

## 2013-03-24 DIAGNOSIS — F411 Generalized anxiety disorder: Secondary | ICD-10-CM | POA: Diagnosis not present

## 2013-03-24 DIAGNOSIS — I10 Essential (primary) hypertension: Secondary | ICD-10-CM | POA: Diagnosis not present

## 2013-03-24 DIAGNOSIS — M545 Low back pain: Secondary | ICD-10-CM | POA: Diagnosis not present

## 2013-03-24 DIAGNOSIS — R262 Difficulty in walking, not elsewhere classified: Secondary | ICD-10-CM | POA: Diagnosis not present

## 2013-03-24 DIAGNOSIS — Z981 Arthrodesis status: Secondary | ICD-10-CM | POA: Diagnosis not present

## 2013-03-24 DIAGNOSIS — G2581 Restless legs syndrome: Secondary | ICD-10-CM | POA: Diagnosis not present

## 2013-03-24 DIAGNOSIS — M6281 Muscle weakness (generalized): Secondary | ICD-10-CM | POA: Diagnosis not present

## 2013-03-24 DIAGNOSIS — Z4889 Encounter for other specified surgical aftercare: Secondary | ICD-10-CM | POA: Diagnosis not present

## 2013-03-24 DIAGNOSIS — Z5189 Encounter for other specified aftercare: Secondary | ICD-10-CM | POA: Diagnosis not present

## 2013-03-24 DIAGNOSIS — N959 Unspecified menopausal and perimenopausal disorder: Secondary | ICD-10-CM | POA: Diagnosis not present

## 2013-03-24 DIAGNOSIS — G609 Hereditary and idiopathic neuropathy, unspecified: Secondary | ICD-10-CM | POA: Diagnosis not present

## 2013-03-24 DIAGNOSIS — E785 Hyperlipidemia, unspecified: Secondary | ICD-10-CM | POA: Diagnosis not present

## 2013-03-24 DIAGNOSIS — E039 Hypothyroidism, unspecified: Secondary | ICD-10-CM | POA: Diagnosis not present

## 2013-03-24 DIAGNOSIS — K219 Gastro-esophageal reflux disease without esophagitis: Secondary | ICD-10-CM | POA: Diagnosis not present

## 2013-03-24 DIAGNOSIS — M47817 Spondylosis without myelopathy or radiculopathy, lumbosacral region: Secondary | ICD-10-CM | POA: Diagnosis not present

## 2013-03-24 DIAGNOSIS — G4700 Insomnia, unspecified: Secondary | ICD-10-CM | POA: Diagnosis not present

## 2013-03-24 DIAGNOSIS — F329 Major depressive disorder, single episode, unspecified: Secondary | ICD-10-CM | POA: Diagnosis not present

## 2013-03-24 DIAGNOSIS — M810 Age-related osteoporosis without current pathological fracture: Secondary | ICD-10-CM | POA: Diagnosis not present

## 2013-04-05 DIAGNOSIS — M545 Low back pain: Secondary | ICD-10-CM | POA: Diagnosis not present

## 2013-04-08 DIAGNOSIS — Z4789 Encounter for other orthopedic aftercare: Secondary | ICD-10-CM | POA: Diagnosis not present

## 2013-04-08 DIAGNOSIS — G609 Hereditary and idiopathic neuropathy, unspecified: Secondary | ICD-10-CM | POA: Diagnosis not present

## 2013-04-08 DIAGNOSIS — I10 Essential (primary) hypertension: Secondary | ICD-10-CM | POA: Diagnosis not present

## 2013-04-11 DIAGNOSIS — I10 Essential (primary) hypertension: Secondary | ICD-10-CM | POA: Diagnosis not present

## 2013-04-11 DIAGNOSIS — G609 Hereditary and idiopathic neuropathy, unspecified: Secondary | ICD-10-CM | POA: Diagnosis not present

## 2013-04-11 DIAGNOSIS — Z4789 Encounter for other orthopedic aftercare: Secondary | ICD-10-CM | POA: Diagnosis not present

## 2013-04-12 DIAGNOSIS — Z4789 Encounter for other orthopedic aftercare: Secondary | ICD-10-CM | POA: Diagnosis not present

## 2013-04-12 DIAGNOSIS — G609 Hereditary and idiopathic neuropathy, unspecified: Secondary | ICD-10-CM | POA: Diagnosis not present

## 2013-04-12 DIAGNOSIS — I10 Essential (primary) hypertension: Secondary | ICD-10-CM | POA: Diagnosis not present

## 2013-04-14 DIAGNOSIS — Z79899 Other long term (current) drug therapy: Secondary | ICD-10-CM | POA: Diagnosis not present

## 2013-04-14 DIAGNOSIS — M5137 Other intervertebral disc degeneration, lumbosacral region: Secondary | ICD-10-CM | POA: Diagnosis not present

## 2013-04-14 DIAGNOSIS — G609 Hereditary and idiopathic neuropathy, unspecified: Secondary | ICD-10-CM | POA: Diagnosis not present

## 2013-04-14 DIAGNOSIS — M810 Age-related osteoporosis without current pathological fracture: Secondary | ICD-10-CM | POA: Diagnosis not present

## 2013-04-14 DIAGNOSIS — Z4789 Encounter for other orthopedic aftercare: Secondary | ICD-10-CM | POA: Diagnosis not present

## 2013-04-14 DIAGNOSIS — Z981 Arthrodesis status: Secondary | ICD-10-CM | POA: Diagnosis not present

## 2013-04-14 DIAGNOSIS — R4182 Altered mental status, unspecified: Secondary | ICD-10-CM | POA: Diagnosis not present

## 2013-04-14 DIAGNOSIS — K219 Gastro-esophageal reflux disease without esophagitis: Secondary | ICD-10-CM | POA: Diagnosis not present

## 2013-04-14 DIAGNOSIS — F411 Generalized anxiety disorder: Secondary | ICD-10-CM | POA: Diagnosis not present

## 2013-04-14 DIAGNOSIS — F329 Major depressive disorder, single episode, unspecified: Secondary | ICD-10-CM | POA: Diagnosis not present

## 2013-04-14 DIAGNOSIS — I10 Essential (primary) hypertension: Secondary | ICD-10-CM | POA: Diagnosis not present

## 2013-04-14 DIAGNOSIS — M539 Dorsopathy, unspecified: Secondary | ICD-10-CM | POA: Diagnosis not present

## 2013-04-15 DIAGNOSIS — G609 Hereditary and idiopathic neuropathy, unspecified: Secondary | ICD-10-CM | POA: Diagnosis not present

## 2013-04-15 DIAGNOSIS — I10 Essential (primary) hypertension: Secondary | ICD-10-CM | POA: Diagnosis not present

## 2013-04-15 DIAGNOSIS — Z4789 Encounter for other orthopedic aftercare: Secondary | ICD-10-CM | POA: Diagnosis not present

## 2013-04-16 DIAGNOSIS — G609 Hereditary and idiopathic neuropathy, unspecified: Secondary | ICD-10-CM | POA: Diagnosis not present

## 2013-04-16 DIAGNOSIS — I10 Essential (primary) hypertension: Secondary | ICD-10-CM | POA: Diagnosis not present

## 2013-04-16 DIAGNOSIS — Z4789 Encounter for other orthopedic aftercare: Secondary | ICD-10-CM | POA: Diagnosis not present

## 2013-04-18 DIAGNOSIS — G609 Hereditary and idiopathic neuropathy, unspecified: Secondary | ICD-10-CM | POA: Diagnosis not present

## 2013-04-18 DIAGNOSIS — I10 Essential (primary) hypertension: Secondary | ICD-10-CM | POA: Diagnosis not present

## 2013-04-18 DIAGNOSIS — Z4789 Encounter for other orthopedic aftercare: Secondary | ICD-10-CM | POA: Diagnosis not present

## 2013-04-19 DIAGNOSIS — I10 Essential (primary) hypertension: Secondary | ICD-10-CM | POA: Diagnosis not present

## 2013-04-19 DIAGNOSIS — G609 Hereditary and idiopathic neuropathy, unspecified: Secondary | ICD-10-CM | POA: Diagnosis not present

## 2013-04-19 DIAGNOSIS — Z4789 Encounter for other orthopedic aftercare: Secondary | ICD-10-CM | POA: Diagnosis not present

## 2013-04-21 DIAGNOSIS — Z4789 Encounter for other orthopedic aftercare: Secondary | ICD-10-CM | POA: Diagnosis not present

## 2013-04-21 DIAGNOSIS — I10 Essential (primary) hypertension: Secondary | ICD-10-CM | POA: Diagnosis not present

## 2013-04-21 DIAGNOSIS — G609 Hereditary and idiopathic neuropathy, unspecified: Secondary | ICD-10-CM | POA: Diagnosis not present

## 2013-04-22 DIAGNOSIS — I10 Essential (primary) hypertension: Secondary | ICD-10-CM | POA: Diagnosis not present

## 2013-04-22 DIAGNOSIS — Z4789 Encounter for other orthopedic aftercare: Secondary | ICD-10-CM | POA: Diagnosis not present

## 2013-04-22 DIAGNOSIS — G609 Hereditary and idiopathic neuropathy, unspecified: Secondary | ICD-10-CM | POA: Diagnosis not present

## 2013-05-05 DIAGNOSIS — M47817 Spondylosis without myelopathy or radiculopathy, lumbosacral region: Secondary | ICD-10-CM | POA: Diagnosis not present

## 2013-05-05 DIAGNOSIS — M48061 Spinal stenosis, lumbar region without neurogenic claudication: Secondary | ICD-10-CM | POA: Diagnosis not present

## 2013-05-05 DIAGNOSIS — M5137 Other intervertebral disc degeneration, lumbosacral region: Secondary | ICD-10-CM | POA: Diagnosis not present

## 2013-05-05 DIAGNOSIS — IMO0002 Reserved for concepts with insufficient information to code with codable children: Secondary | ICD-10-CM | POA: Diagnosis not present

## 2013-05-06 DIAGNOSIS — E782 Mixed hyperlipidemia: Secondary | ICD-10-CM | POA: Diagnosis not present

## 2013-05-06 DIAGNOSIS — M545 Low back pain: Secondary | ICD-10-CM | POA: Diagnosis not present

## 2013-05-06 DIAGNOSIS — I10 Essential (primary) hypertension: Secondary | ICD-10-CM | POA: Diagnosis not present

## 2013-05-06 DIAGNOSIS — E039 Hypothyroidism, unspecified: Secondary | ICD-10-CM | POA: Diagnosis not present

## 2013-05-25 DIAGNOSIS — M48061 Spinal stenosis, lumbar region without neurogenic claudication: Secondary | ICD-10-CM | POA: Diagnosis not present

## 2013-05-25 DIAGNOSIS — M5137 Other intervertebral disc degeneration, lumbosacral region: Secondary | ICD-10-CM | POA: Diagnosis not present

## 2013-05-25 DIAGNOSIS — M47817 Spondylosis without myelopathy or radiculopathy, lumbosacral region: Secondary | ICD-10-CM | POA: Diagnosis not present

## 2013-05-25 DIAGNOSIS — IMO0002 Reserved for concepts with insufficient information to code with codable children: Secondary | ICD-10-CM | POA: Diagnosis not present

## 2013-05-26 DIAGNOSIS — E039 Hypothyroidism, unspecified: Secondary | ICD-10-CM | POA: Diagnosis not present

## 2013-06-02 DIAGNOSIS — Z1231 Encounter for screening mammogram for malignant neoplasm of breast: Secondary | ICD-10-CM | POA: Diagnosis not present

## 2013-06-03 DIAGNOSIS — M5137 Other intervertebral disc degeneration, lumbosacral region: Secondary | ICD-10-CM | POA: Diagnosis not present

## 2013-06-03 DIAGNOSIS — IMO0001 Reserved for inherently not codable concepts without codable children: Secondary | ICD-10-CM | POA: Diagnosis not present

## 2013-06-07 DIAGNOSIS — M722 Plantar fascial fibromatosis: Secondary | ICD-10-CM | POA: Diagnosis not present

## 2013-06-07 DIAGNOSIS — M47817 Spondylosis without myelopathy or radiculopathy, lumbosacral region: Secondary | ICD-10-CM | POA: Diagnosis not present

## 2013-06-07 DIAGNOSIS — M5137 Other intervertebral disc degeneration, lumbosacral region: Secondary | ICD-10-CM | POA: Diagnosis not present

## 2013-06-07 DIAGNOSIS — M48061 Spinal stenosis, lumbar region without neurogenic claudication: Secondary | ICD-10-CM | POA: Diagnosis not present

## 2013-06-09 DIAGNOSIS — M47817 Spondylosis without myelopathy or radiculopathy, lumbosacral region: Secondary | ICD-10-CM | POA: Diagnosis not present

## 2013-06-09 DIAGNOSIS — M722 Plantar fascial fibromatosis: Secondary | ICD-10-CM | POA: Diagnosis not present

## 2013-06-09 DIAGNOSIS — M48061 Spinal stenosis, lumbar region without neurogenic claudication: Secondary | ICD-10-CM | POA: Diagnosis not present

## 2013-06-09 DIAGNOSIS — M5137 Other intervertebral disc degeneration, lumbosacral region: Secondary | ICD-10-CM | POA: Diagnosis not present

## 2013-06-14 DIAGNOSIS — IMO0001 Reserved for inherently not codable concepts without codable children: Secondary | ICD-10-CM | POA: Diagnosis not present

## 2013-06-14 DIAGNOSIS — M5137 Other intervertebral disc degeneration, lumbosacral region: Secondary | ICD-10-CM | POA: Diagnosis not present

## 2013-06-16 DIAGNOSIS — IMO0001 Reserved for inherently not codable concepts without codable children: Secondary | ICD-10-CM | POA: Diagnosis not present

## 2013-06-16 DIAGNOSIS — M5137 Other intervertebral disc degeneration, lumbosacral region: Secondary | ICD-10-CM | POA: Diagnosis not present

## 2013-06-17 DIAGNOSIS — IMO0001 Reserved for inherently not codable concepts without codable children: Secondary | ICD-10-CM | POA: Diagnosis not present

## 2013-06-17 DIAGNOSIS — M5137 Other intervertebral disc degeneration, lumbosacral region: Secondary | ICD-10-CM | POA: Diagnosis not present

## 2013-06-21 DIAGNOSIS — IMO0001 Reserved for inherently not codable concepts without codable children: Secondary | ICD-10-CM | POA: Diagnosis not present

## 2013-06-21 DIAGNOSIS — M5137 Other intervertebral disc degeneration, lumbosacral region: Secondary | ICD-10-CM | POA: Diagnosis not present

## 2013-06-23 DIAGNOSIS — L02619 Cutaneous abscess of unspecified foot: Secondary | ICD-10-CM | POA: Diagnosis not present

## 2013-06-23 DIAGNOSIS — IMO0001 Reserved for inherently not codable concepts without codable children: Secondary | ICD-10-CM | POA: Diagnosis not present

## 2013-06-23 DIAGNOSIS — R609 Edema, unspecified: Secondary | ICD-10-CM | POA: Diagnosis not present

## 2013-06-23 DIAGNOSIS — M5137 Other intervertebral disc degeneration, lumbosacral region: Secondary | ICD-10-CM | POA: Diagnosis not present

## 2013-06-24 DIAGNOSIS — IMO0001 Reserved for inherently not codable concepts without codable children: Secondary | ICD-10-CM | POA: Diagnosis not present

## 2013-06-24 DIAGNOSIS — M5137 Other intervertebral disc degeneration, lumbosacral region: Secondary | ICD-10-CM | POA: Diagnosis not present

## 2013-06-24 DIAGNOSIS — M79609 Pain in unspecified limb: Secondary | ICD-10-CM | POA: Diagnosis not present

## 2013-06-24 DIAGNOSIS — M7989 Other specified soft tissue disorders: Secondary | ICD-10-CM | POA: Diagnosis not present

## 2013-06-29 IMAGING — RF DG MYELOGRAM LUMBAR
13 of 20 series · 13 of 20 positions shown · IV contrast (omnipaque)
Comparison: Lumbar MRI 12/26/2011

MYELOGRAM INJECTION
TECHNIQUE: Informed consent was obtained from the patient prior to
the procedure, including potential complications of headache,
allergy, infection and pain. Specific instructions were given
regarding 24 hour bedrest post procedure to prevent post-LP
headache.  A timeout procedure was performed.  With the patient
prone, the lower back was prepped with Betadine.  1% Lidocaine was
used for local anesthesia.  Lumbar puncture was performed by the
radiologist at the L4-L5 level using a 22 gauge needle with return
of clear CSF.  15 cc of Omnipaque 180 was injected into the
subarachnoid space .
CLINICAL DATA: Low back pain.  Previous lumbar fusion now with
developing right leg pain and tingling.
TECHNIQUE: Multidetector CT imaging of the lumbar spine was
performed following myelography.  Multiplanar CT image
reconstructions were also generated.

[Series 1: (hospital) · 1 of 1 slices shown]
[im 1/1]
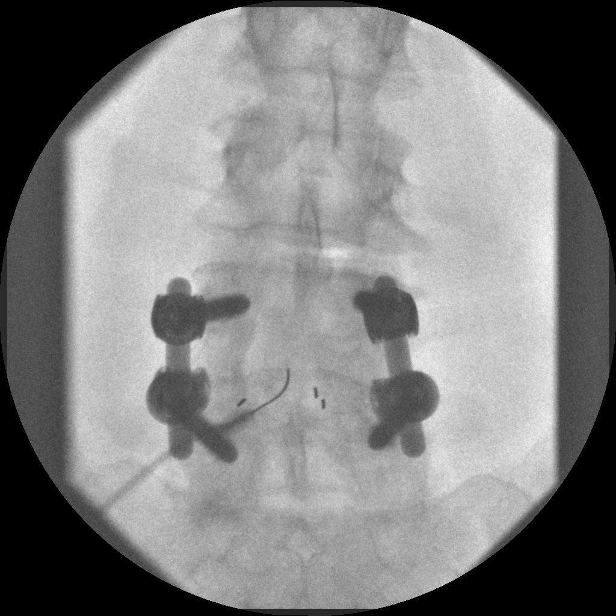

[Series 3: myelogram  white · 1 of 1 slices shown (1 of 9)]
[im 1/1]
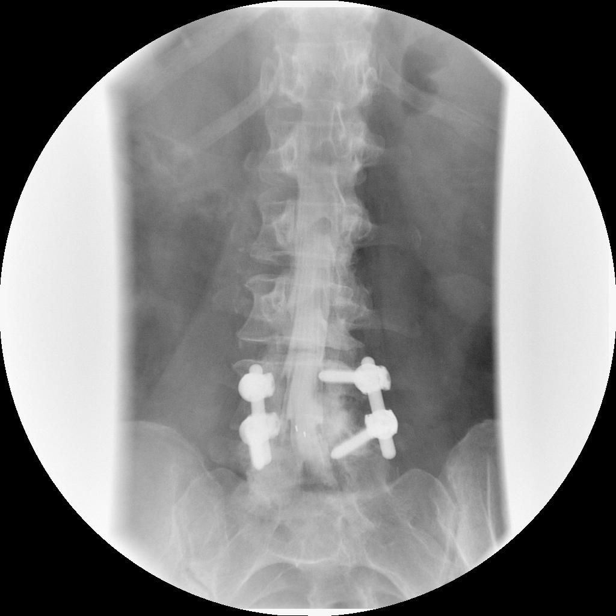

[Series 4: myelogram  white · 1 of 1 slices shown (2 of 9)]
[im 1/1]
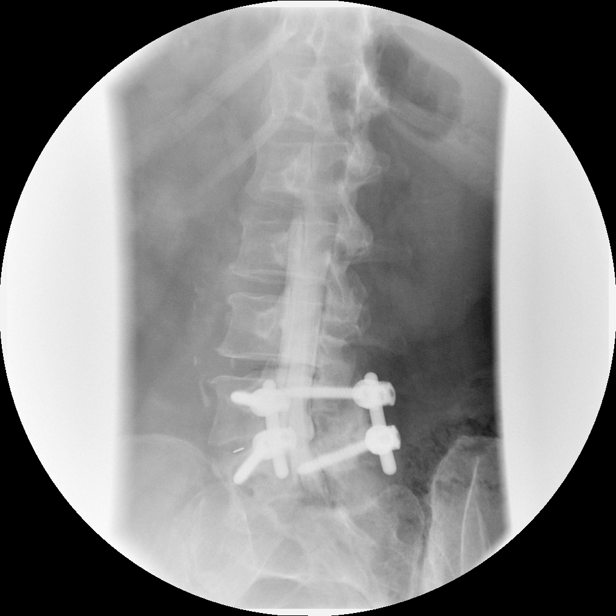

[Series 6: myelogram  white · 1 of 1 slices shown (3 of 9)]
[im 1/1]
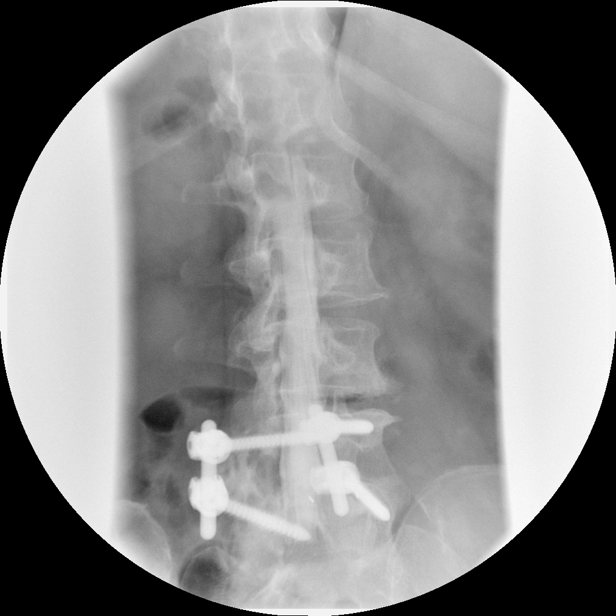

[Series 7: myelogram  white · 1 of 1 slices shown (4 of 9)]
[im 1/1]
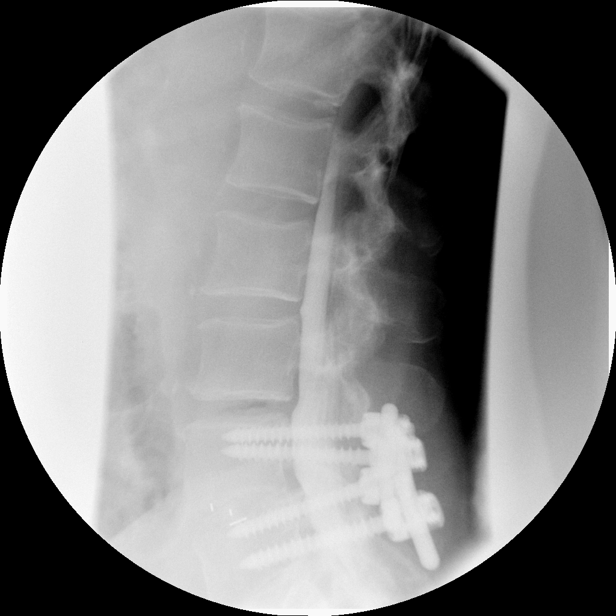

[Series 9: myelogram  white · 1 of 1 slices shown (5 of 9)]
[im 1/1]
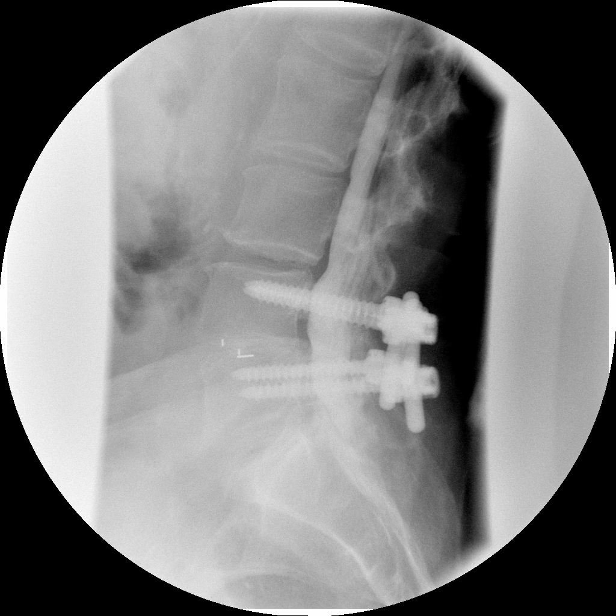

[Series 11: myelogram  white · 1 of 1 slices shown (6 of 9)]
[im 1/1]
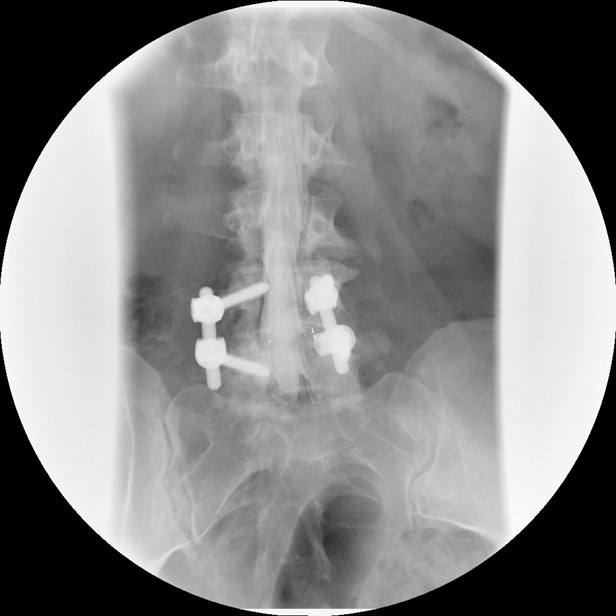

[Series 12: myelogram  white · 1 of 1 slices shown (7 of 9)]
[im 1/1]
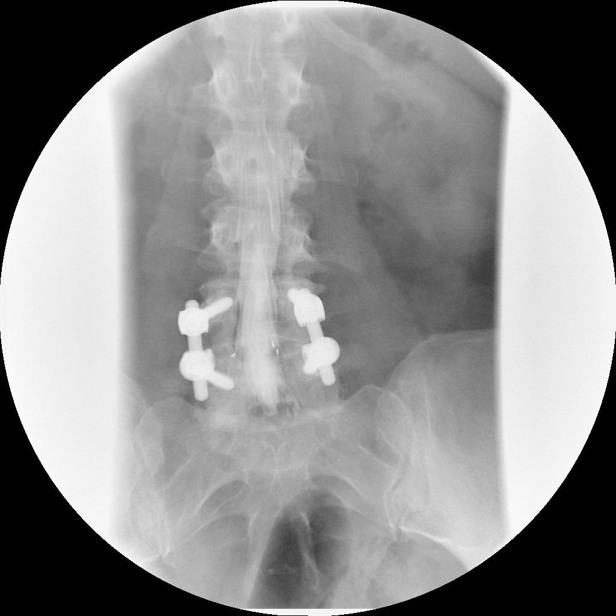

[Series 14: myelogram  white · 1 of 1 slices shown (8 of 9)]
[im 1/1]
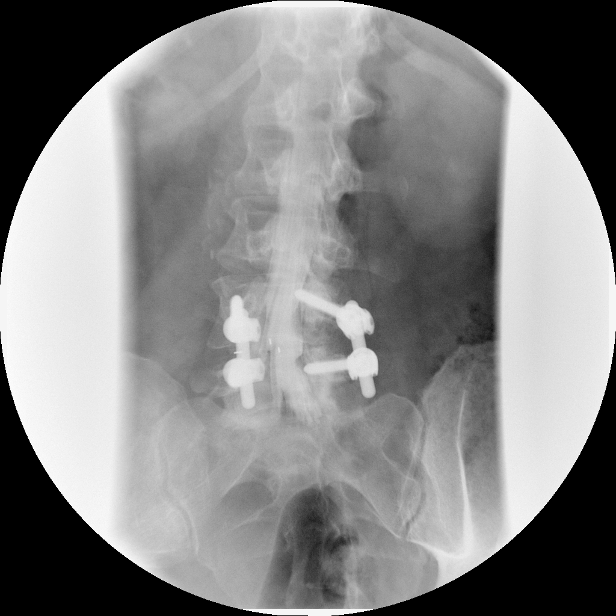

[Series 15: myelogram  white · 1 of 1 slices shown (9 of 9)]
[im 1/1]
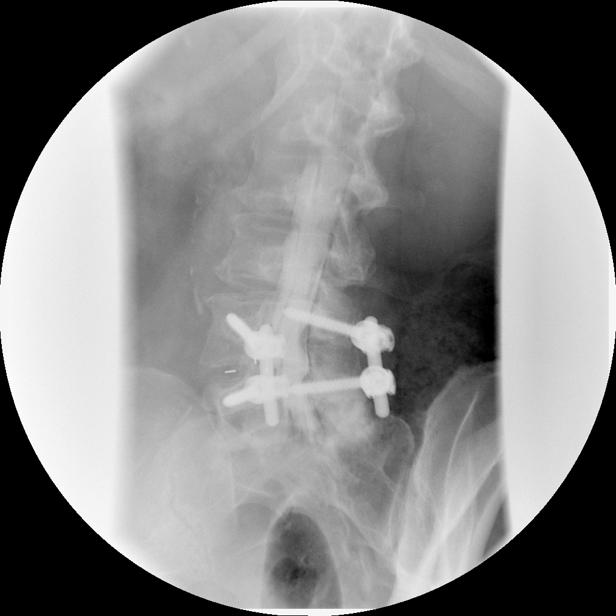

[Series 1001: view not recorded · 0.20mm/px · 1 of 1 slices shown (1 of 3)]
[im 1/1]
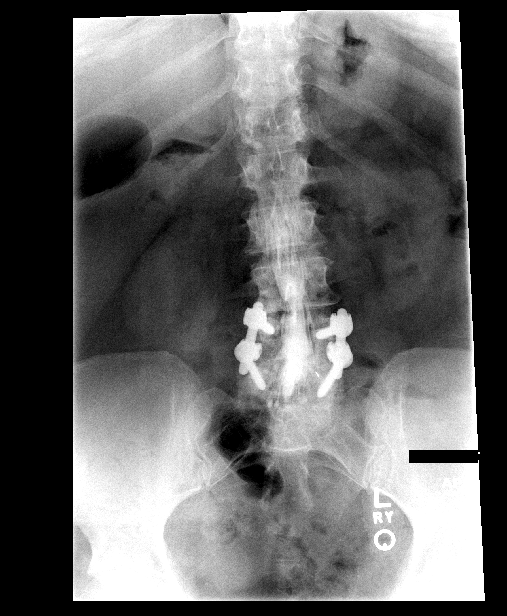

[Series 1002: view not recorded · 0.20mm/px · 1 of 1 slices shown (2 of 3)]
[im 1/1]
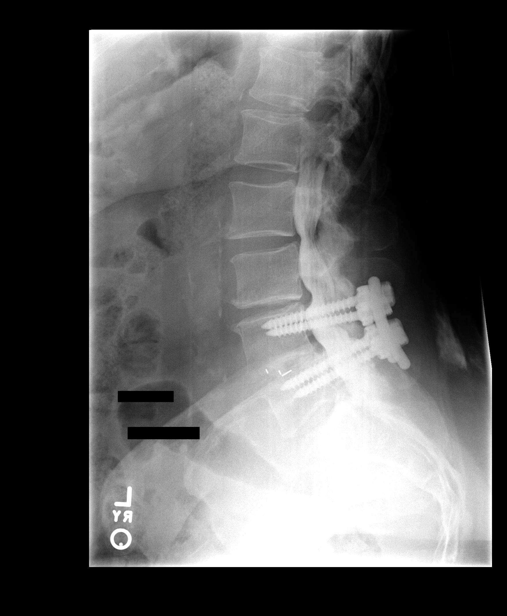

[Series 1004: view not recorded · 0.20mm/px · 1 of 1 slices shown (3 of 3)]
[im 1/1]
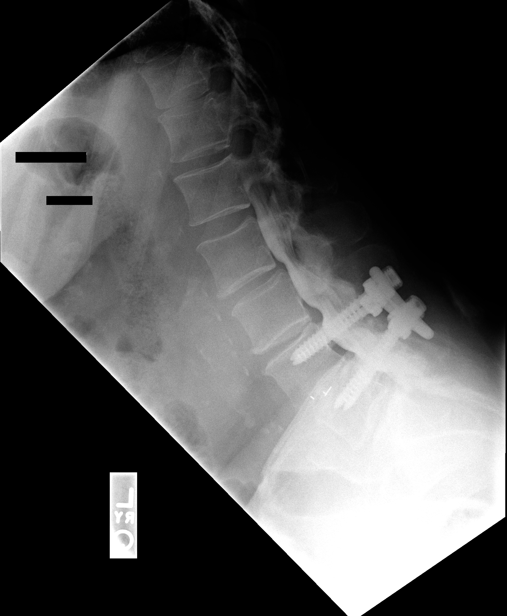

[13 of 20 positions shown; findings below may reference images not displayed]

IMPRESSION: Successful injection of  intrathecal contrast for myelography.

MYELOGRAM LUMBAR
FINDINGS: Good opacification lumbar subarachnoid space.  Solid L4-
L5 fusion with anatomic alignment.  Asymmetric loss of interspace
height at L3-4 on the right. Slight dorsal to the defect at L4-5 on
the right relates to facet overgrowth but there is no L5 nerve root
encroachment.  There is no spinal stenosis at L4-L5.  With the
patient prone for myelography, there is no significant extradural
defect at L3-4. No significant S1 nerve root block off.

With the patient upright, the disc extrusion at L3-4 on the right
becomes much more pronounced.  There is significant compression of
the right L4 nerve root on the AP upright examination.  Mild
anterolisthesis of L2 on L3 of 2 mm is seen with the patient
upright in neutral and extension.  This increases to 3 mm in
flexion.

Fluoroscopy Time: 45 seconds
IMPRESSION: As above.

CT MYELOGRAPHY LUMBAR SPINE
FINDINGS: No prevertebral or paraspinous masses.

L1-2:  Normal interspace.

L2-3: Mild facet arthropathy.  Mild annular bulging.  Anatomic
alignment with the patient recumbent.  No nerve root cut off.

L3-4: 2 mm retrolisthesis with the patient recumbent. Central and
rightward extrusion with vacuum phenomenon extends into the
foramen. Mild to moderate facet arthropathy.  Mild spurring in the
right foramen and extraforaminal soft tissues.  Right L3 and right
L4 nerve root encroachment are present.

L4-5: Solid fusion.  Hardware intact without loosening.  Epidural
fibrosis on the right, with no L4 or L5 nerve root encroachment
present. There is no significant spinal stenosis.

L5-S1: Advanced facet arthropathy.  Vacuum disc phenomenon with
mild annular bulging.  No significant L5 or S1 nerve root
encroachment.

Compared with prior MRI, the extradural defect at L3-4 on the right
is not appreciated to the same degree as myelography, because this
appears to be dynamic, related to the patient's weight bearing.
IMPRESSION: Solid fusion L4-L5 without residual neural encroachment.

Adjacent segment disease at L3-L4 with a central and rightward disc
extrusion, asymmetric loss of disc space height on the right, and
extraforaminal spurring with bony and disc related foraminal
narrowing; right L3 and right L4 nerve root encroachment are noted,
most pronounced with the patient upright/weightbearing.

## 2013-06-30 DIAGNOSIS — M722 Plantar fascial fibromatosis: Secondary | ICD-10-CM | POA: Diagnosis not present

## 2013-06-30 DIAGNOSIS — M47817 Spondylosis without myelopathy or radiculopathy, lumbosacral region: Secondary | ICD-10-CM | POA: Diagnosis not present

## 2013-06-30 DIAGNOSIS — IMO0001 Reserved for inherently not codable concepts without codable children: Secondary | ICD-10-CM | POA: Diagnosis not present

## 2013-06-30 DIAGNOSIS — M51379 Other intervertebral disc degeneration, lumbosacral region without mention of lumbar back pain or lower extremity pain: Secondary | ICD-10-CM | POA: Diagnosis not present

## 2013-06-30 DIAGNOSIS — M5137 Other intervertebral disc degeneration, lumbosacral region: Secondary | ICD-10-CM | POA: Diagnosis not present

## 2013-06-30 DIAGNOSIS — M48061 Spinal stenosis, lumbar region without neurogenic claudication: Secondary | ICD-10-CM | POA: Diagnosis not present

## 2013-07-04 DIAGNOSIS — M109 Gout, unspecified: Secondary | ICD-10-CM | POA: Diagnosis not present

## 2013-07-04 DIAGNOSIS — R609 Edema, unspecified: Secondary | ICD-10-CM | POA: Diagnosis not present

## 2013-07-05 DIAGNOSIS — IMO0001 Reserved for inherently not codable concepts without codable children: Secondary | ICD-10-CM | POA: Diagnosis not present

## 2013-07-05 DIAGNOSIS — M5137 Other intervertebral disc degeneration, lumbosacral region: Secondary | ICD-10-CM | POA: Diagnosis not present

## 2013-07-06 DIAGNOSIS — M5137 Other intervertebral disc degeneration, lumbosacral region: Secondary | ICD-10-CM | POA: Diagnosis not present

## 2013-07-06 DIAGNOSIS — M47817 Spondylosis without myelopathy or radiculopathy, lumbosacral region: Secondary | ICD-10-CM | POA: Diagnosis not present

## 2013-07-06 DIAGNOSIS — M48061 Spinal stenosis, lumbar region without neurogenic claudication: Secondary | ICD-10-CM | POA: Diagnosis not present

## 2013-07-06 DIAGNOSIS — M109 Gout, unspecified: Secondary | ICD-10-CM | POA: Diagnosis not present

## 2013-07-07 DIAGNOSIS — IMO0001 Reserved for inherently not codable concepts without codable children: Secondary | ICD-10-CM | POA: Diagnosis not present

## 2013-07-07 DIAGNOSIS — M5137 Other intervertebral disc degeneration, lumbosacral region: Secondary | ICD-10-CM | POA: Diagnosis not present

## 2013-07-08 DIAGNOSIS — M5137 Other intervertebral disc degeneration, lumbosacral region: Secondary | ICD-10-CM | POA: Diagnosis not present

## 2013-07-08 DIAGNOSIS — IMO0001 Reserved for inherently not codable concepts without codable children: Secondary | ICD-10-CM | POA: Diagnosis not present

## 2013-07-11 DIAGNOSIS — M79609 Pain in unspecified limb: Secondary | ICD-10-CM | POA: Diagnosis not present

## 2013-07-11 DIAGNOSIS — M19079 Primary osteoarthritis, unspecified ankle and foot: Secondary | ICD-10-CM | POA: Diagnosis not present

## 2013-07-11 DIAGNOSIS — M722 Plantar fascial fibromatosis: Secondary | ICD-10-CM | POA: Diagnosis not present

## 2013-07-12 DIAGNOSIS — M79609 Pain in unspecified limb: Secondary | ICD-10-CM | POA: Diagnosis not present

## 2013-07-22 DIAGNOSIS — G9050 Complex regional pain syndrome I, unspecified: Secondary | ICD-10-CM | POA: Diagnosis not present

## 2013-07-22 DIAGNOSIS — M545 Low back pain: Secondary | ICD-10-CM | POA: Diagnosis not present

## 2013-07-22 DIAGNOSIS — E039 Hypothyroidism, unspecified: Secondary | ICD-10-CM | POA: Diagnosis not present

## 2013-07-22 DIAGNOSIS — E782 Mixed hyperlipidemia: Secondary | ICD-10-CM | POA: Diagnosis not present

## 2013-07-22 DIAGNOSIS — I10 Essential (primary) hypertension: Secondary | ICD-10-CM | POA: Diagnosis not present

## 2013-08-01 DIAGNOSIS — M79609 Pain in unspecified limb: Secondary | ICD-10-CM | POA: Diagnosis not present

## 2013-08-01 DIAGNOSIS — M76829 Posterior tibial tendinitis, unspecified leg: Secondary | ICD-10-CM | POA: Diagnosis not present

## 2013-08-03 DIAGNOSIS — M48061 Spinal stenosis, lumbar region without neurogenic claudication: Secondary | ICD-10-CM | POA: Diagnosis not present

## 2013-08-03 DIAGNOSIS — IMO0002 Reserved for concepts with insufficient information to code with codable children: Secondary | ICD-10-CM | POA: Diagnosis not present

## 2013-08-03 DIAGNOSIS — M5137 Other intervertebral disc degeneration, lumbosacral region: Secondary | ICD-10-CM | POA: Diagnosis not present

## 2013-08-03 DIAGNOSIS — M47817 Spondylosis without myelopathy or radiculopathy, lumbosacral region: Secondary | ICD-10-CM | POA: Diagnosis not present

## 2013-08-05 DIAGNOSIS — S93499A Sprain of other ligament of unspecified ankle, initial encounter: Secondary | ICD-10-CM | POA: Diagnosis not present

## 2013-08-05 DIAGNOSIS — R609 Edema, unspecified: Secondary | ICD-10-CM | POA: Diagnosis not present

## 2013-08-05 DIAGNOSIS — M899 Disorder of bone, unspecified: Secondary | ICD-10-CM | POA: Diagnosis not present

## 2013-08-05 DIAGNOSIS — S93409A Sprain of unspecified ligament of unspecified ankle, initial encounter: Secondary | ICD-10-CM | POA: Diagnosis not present

## 2013-08-05 DIAGNOSIS — M1710 Unilateral primary osteoarthritis, unspecified knee: Secondary | ICD-10-CM | POA: Diagnosis not present

## 2013-08-11 DIAGNOSIS — H2510 Age-related nuclear cataract, unspecified eye: Secondary | ICD-10-CM | POA: Diagnosis not present

## 2013-08-11 DIAGNOSIS — H52229 Regular astigmatism, unspecified eye: Secondary | ICD-10-CM | POA: Diagnosis not present

## 2013-08-11 DIAGNOSIS — H524 Presbyopia: Secondary | ICD-10-CM | POA: Diagnosis not present

## 2013-08-11 DIAGNOSIS — H5200 Hypermetropia, unspecified eye: Secondary | ICD-10-CM | POA: Diagnosis not present

## 2013-08-15 DIAGNOSIS — M79609 Pain in unspecified limb: Secondary | ICD-10-CM | POA: Diagnosis not present

## 2013-08-15 DIAGNOSIS — M76829 Posterior tibial tendinitis, unspecified leg: Secondary | ICD-10-CM | POA: Diagnosis not present

## 2013-08-17 DIAGNOSIS — E039 Hypothyroidism, unspecified: Secondary | ICD-10-CM | POA: Diagnosis not present

## 2013-08-17 DIAGNOSIS — M47817 Spondylosis without myelopathy or radiculopathy, lumbosacral region: Secondary | ICD-10-CM | POA: Diagnosis not present

## 2013-08-17 DIAGNOSIS — M48061 Spinal stenosis, lumbar region without neurogenic claudication: Secondary | ICD-10-CM | POA: Diagnosis not present

## 2013-08-17 DIAGNOSIS — I10 Essential (primary) hypertension: Secondary | ICD-10-CM | POA: Diagnosis not present

## 2013-08-17 DIAGNOSIS — M79609 Pain in unspecified limb: Secondary | ICD-10-CM | POA: Diagnosis not present

## 2013-08-17 DIAGNOSIS — E782 Mixed hyperlipidemia: Secondary | ICD-10-CM | POA: Diagnosis not present

## 2013-08-17 DIAGNOSIS — IMO0001 Reserved for inherently not codable concepts without codable children: Secondary | ICD-10-CM | POA: Diagnosis not present

## 2013-08-17 DIAGNOSIS — M5137 Other intervertebral disc degeneration, lumbosacral region: Secondary | ICD-10-CM | POA: Diagnosis not present

## 2013-08-23 DIAGNOSIS — I10 Essential (primary) hypertension: Secondary | ICD-10-CM | POA: Diagnosis not present

## 2013-08-23 DIAGNOSIS — M76829 Posterior tibial tendinitis, unspecified leg: Secondary | ICD-10-CM | POA: Diagnosis not present

## 2013-08-23 DIAGNOSIS — M5137 Other intervertebral disc degeneration, lumbosacral region: Secondary | ICD-10-CM | POA: Diagnosis not present

## 2013-08-23 DIAGNOSIS — Z01818 Encounter for other preprocedural examination: Secondary | ICD-10-CM | POA: Diagnosis not present

## 2013-08-23 DIAGNOSIS — M19079 Primary osteoarthritis, unspecified ankle and foot: Secondary | ICD-10-CM | POA: Diagnosis not present

## 2013-08-23 DIAGNOSIS — M2140 Flat foot [pes planus] (acquired), unspecified foot: Secondary | ICD-10-CM | POA: Diagnosis not present

## 2013-09-28 DIAGNOSIS — I10 Essential (primary) hypertension: Secondary | ICD-10-CM | POA: Diagnosis not present

## 2013-09-28 DIAGNOSIS — IMO0002 Reserved for concepts with insufficient information to code with codable children: Secondary | ICD-10-CM | POA: Diagnosis not present

## 2013-09-28 DIAGNOSIS — G9050 Complex regional pain syndrome I, unspecified: Secondary | ICD-10-CM | POA: Diagnosis not present

## 2013-09-28 DIAGNOSIS — E039 Hypothyroidism, unspecified: Secondary | ICD-10-CM | POA: Diagnosis not present

## 2013-09-28 DIAGNOSIS — M545 Low back pain: Secondary | ICD-10-CM | POA: Diagnosis not present

## 2013-09-28 DIAGNOSIS — M67919 Unspecified disorder of synovium and tendon, unspecified shoulder: Secondary | ICD-10-CM | POA: Diagnosis not present

## 2013-09-28 DIAGNOSIS — Z23 Encounter for immunization: Secondary | ICD-10-CM | POA: Diagnosis not present

## 2013-09-28 DIAGNOSIS — E782 Mixed hyperlipidemia: Secondary | ICD-10-CM | POA: Diagnosis not present

## 2013-10-06 NOTE — Other (Signed)
[facility]  PREOPERATIVE INSTRUCTION  Surgery Date:   10-19-13  Surgery arrival time given by surgeon: NO   If ???no???,[facility] staff will call you between 4 PM- 8 PM the day before surgery with your arrival time. If your surgery is on a Monday, we will call you the preceding Friday.       Please call [phone] after 8 PM if you did not receive your arrival time.    1. Please report at the designated time to the 2nd Floor Admitting Desk.  Bring your insurance card, photo identification, and any copayment ( if applicable).  2. You must have a responsible adult to drive you home. You need to have a responsible adult to stay with you the first 24 hours after surgery if you are going home the same day of your surgery and you should not drive a car for 24 hours following your surgery.  3. Nothing to eat or drink after midnight the night before surgery. This includes no water, gum, mints, coffee, juice, etc.  Please note special instructions, if applicable, below for medications.  4. MEDICATIONS TO TAKE THE MORNING OF SURGERY WITH A SIP OF WATER: Levothyroxine, Cymbalta, and Lyrica  5. No alcoholic beverages 24 hours before or after your surgery.  6. If you are being admitted to the hospital,please leave personal belongings/luggage in your car until you have an assigned hospital room number.  7. Stop Aspirin and/or any non-steroidal anti-inflammatory drugs (i.e. Ibuprofen, Naproxen, Advil, Aleve) as directed by your surgeon.  You may take Tylenol.        Stop herbal supplements 1 week prior to  surgery.  8. If you are currently taking Plavix, Coumadin,or any other blood-thinning/anticoagulant medication contact your surgeon for instructions.  9. Please wear comfortable clothes. Wear your glasses instead of contacts. We ask that all money, jewelry and valuables be left at home. Wear no make up, particularly mascara, the day of surgery.   10.  All body piercings, rings,and jewelry need to be removed and left at home.    Please wear your  hair loose or down. Please no pony-tails, buns, or any metal hair accessories. If you shower the morning of surgery, please do not apply any lotions, powders, or deodorants afterwards.   Do not shave any body area within 24 hours of your surgery.  11. Please follow all instructions to avoid any potential surgical cancellation.  12.  Should your physical condition change, (i.e. fever, cold, flu, etc.) please notify your surgeon as soon as possible.  13. It is important to be on time. If a situation occurs where you may be delayed, please call:  [phone] on the day of surgery.  14. The Preadmission Testing staff can be reached at [phone]..  15. Special instructions: Free Valet Parking    The patient was contacted  via phone.   She  verbalize  understanding of all instructions does not  need reinforcement.

## 2013-10-19 ENCOUNTER — Inpatient Hospital Stay
Admit: 2013-10-19 | Discharge: 2013-10-22 | Disposition: A | Discharge status: Skilled Nursing Facility | Payer: MEDICARE | Attending: Orthopaedic Surgery | Admitting: Orthopaedic Surgery

## 2013-10-19 DIAGNOSIS — K219 Gastro-esophageal reflux disease without esophagitis: Secondary | ICD-10-CM | POA: Insufficient documentation

## 2013-10-19 DIAGNOSIS — G8918 Other acute postprocedural pain: Secondary | ICD-10-CM | POA: Diagnosis not present

## 2013-10-19 DIAGNOSIS — M6281 Muscle weakness (generalized): Secondary | ICD-10-CM | POA: Diagnosis not present

## 2013-10-19 DIAGNOSIS — D649 Anemia, unspecified: Secondary | ICD-10-CM | POA: Diagnosis not present

## 2013-10-19 DIAGNOSIS — E039 Hypothyroidism, unspecified: Secondary | ICD-10-CM | POA: Diagnosis not present

## 2013-10-19 DIAGNOSIS — Z888 Allergy status to other drugs, medicaments and biological substances status: Secondary | ICD-10-CM | POA: Diagnosis not present

## 2013-10-19 DIAGNOSIS — M19079 Primary osteoarthritis, unspecified ankle and foot: Secondary | ICD-10-CM | POA: Diagnosis present

## 2013-10-19 DIAGNOSIS — Z6837 Body mass index (BMI) 37.0-37.9, adult: Secondary | ICD-10-CM | POA: Diagnosis not present

## 2013-10-19 DIAGNOSIS — Z882 Allergy status to sulfonamides status: Secondary | ICD-10-CM | POA: Diagnosis not present

## 2013-10-19 DIAGNOSIS — F3289 Other specified depressive episodes: Secondary | ICD-10-CM | POA: Diagnosis not present

## 2013-10-19 DIAGNOSIS — G8929 Other chronic pain: Secondary | ICD-10-CM | POA: Diagnosis not present

## 2013-10-19 DIAGNOSIS — M2140 Flat foot [pes planus] (acquired), unspecified foot: Secondary | ICD-10-CM | POA: Diagnosis not present

## 2013-10-19 DIAGNOSIS — R209 Unspecified disturbances of skin sensation: Secondary | ICD-10-CM | POA: Diagnosis not present

## 2013-10-19 DIAGNOSIS — M1990 Unspecified osteoarthritis, unspecified site: Secondary | ICD-10-CM | POA: Diagnosis not present

## 2013-10-19 DIAGNOSIS — E669 Obesity, unspecified: Secondary | ICD-10-CM | POA: Diagnosis present

## 2013-10-19 DIAGNOSIS — F329 Major depressive disorder, single episode, unspecified: Secondary | ICD-10-CM | POA: Diagnosis present

## 2013-10-19 DIAGNOSIS — I10 Essential (primary) hypertension: Secondary | ICD-10-CM | POA: Diagnosis not present

## 2013-10-19 DIAGNOSIS — G589 Mononeuropathy, unspecified: Secondary | ICD-10-CM | POA: Diagnosis not present

## 2013-10-19 MED ORDER — PROPOFOL 10 MG/ML IV EMUL
10 mg/mL | INTRAVENOUS | Status: AC
Start: 2013-10-19 — End: ?

## 2013-10-19 MED ORDER — LACTATED RINGERS IV
INTRAVENOUS | Status: DC
Start: 2013-10-19 — End: 2013-10-19
  Administered 2013-10-19: 18:00:00 via INTRAVENOUS

## 2013-10-19 MED ORDER — SODIUM CHLORIDE 0.9 % IJ SYRG
Freq: Three times a day (TID) | INTRAMUSCULAR | Status: DC
Start: 2013-10-19 — End: 2013-10-19
  Administered 2013-10-19: 13:00:00 via INTRAVENOUS

## 2013-10-19 MED ORDER — SENNOSIDES-DOCUSATE SODIUM 8.6 MG-50 MG TAB
Freq: Every evening | ORAL | Status: DC
Start: 2013-10-19 — End: 2013-10-22
  Administered 2013-10-20 – 2013-10-22 (×3): via ORAL

## 2013-10-19 MED ORDER — MIDAZOLAM 1 MG/ML IJ SOLN
1 mg/mL | INTRAMUSCULAR | Status: AC
Start: 2013-10-19 — End: ?

## 2013-10-19 MED ORDER — OXYCODONE-ACETAMINOPHEN 5 MG-325 MG TAB
5-325 mg | ORAL | Status: DC | PRN
Start: 2013-10-19 — End: 2013-10-19

## 2013-10-19 MED ORDER — LEVOTHYROXINE 100 MCG TAB
100 mcg | Freq: Every day | ORAL | Status: DC
Start: 2013-10-19 — End: 2013-10-22
  Administered 2013-10-20 – 2013-10-22 (×3): via ORAL

## 2013-10-19 MED ORDER — CEFAZOLIN 2 GRAM/50 ML NS IVPB
Freq: Once | INTRAVENOUS | Status: AC
Start: 2013-10-19 — End: 2013-10-19
  Administered 2013-10-19: 15:00:00 via INTRAVENOUS

## 2013-10-19 MED ORDER — HYDROCHLOROTHIAZIDE 25 MG TAB
25 mg | Freq: Every day | ORAL | Status: DC
Start: 2013-10-19 — End: 2013-10-20
  Administered 2013-10-20: 14:00:00 via ORAL

## 2013-10-19 MED ORDER — NALOXONE 0.4 MG/ML INJECTION
0.4 mg/mL | INTRAMUSCULAR | Status: DC | PRN
Start: 2013-10-19 — End: 2013-10-19

## 2013-10-19 MED ORDER — LACTATED RINGERS IV
INTRAVENOUS | Status: DC
Start: 2013-10-19 — End: 2013-10-19
  Administered 2013-10-19: 14:00:00 via INTRAVENOUS

## 2013-10-19 MED ORDER — FENTANYL CITRATE (PF) 50 MCG/ML IJ SOLN
50 mcg/mL | INTRAMUSCULAR | Status: AC
Start: 2013-10-19 — End: ?

## 2013-10-19 MED ORDER — ROPIVACAINE (PF) 5 MG/ML (0.5 %) INJECTION
5 mg/mL (0. %) | INTRAMUSCULAR | Status: DC | PRN
Start: 2013-10-19 — End: 2013-10-19
  Administered 2013-10-19 (×2): via PERINEURAL

## 2013-10-19 MED ORDER — PREGABALIN 100 MG CAP
100 mg | Freq: Three times a day (TID) | ORAL | Status: DC
Start: 2013-10-19 — End: 2013-10-22
  Administered 2013-10-19 – 2013-10-22 (×9): via ORAL

## 2013-10-19 MED ORDER — MULTIVITAMIN WITH IRON TABLET
Freq: Every day | ORAL | Status: DC
Start: 2013-10-19 — End: 2013-10-22
  Administered 2013-10-20 – 2013-10-22 (×3): via ORAL

## 2013-10-19 MED ORDER — ONDANSETRON (PF) 4 MG/2 ML INJECTION
4 mg/2 mL | INTRAMUSCULAR | Status: AC
Start: 2013-10-19 — End: ?

## 2013-10-19 MED ORDER — PHENYLEPHRINE 10 MG/ML INJECTION
10 mg/mL | INTRAMUSCULAR | Status: DC | PRN
Start: 2013-10-19 — End: 2013-10-19
  Administered 2013-10-19 (×7): via INTRAVENOUS

## 2013-10-19 MED ORDER — LISINOPRIL-HYDROCHLOROTHIAZIDE 10 MG-12.5 MG TAB
Freq: Every day | ORAL | Status: DC
Start: 2013-10-19 — End: 2013-10-19

## 2013-10-19 MED ORDER — SODIUM CHLORIDE 0.9 % IJ SYRG
INTRAMUSCULAR | Status: DC | PRN
Start: 2013-10-19 — End: 2013-10-22

## 2013-10-19 MED ORDER — MORPHINE (PF) 150 MG/30 ML CONCENTRATED INFUSION
150 mg/30 mL | INTRAVENOUS | Status: DC
Start: 2013-10-19 — End: 2013-10-20
  Administered 2013-10-19 – 2013-10-20 (×4): via INTRAVENOUS

## 2013-10-19 MED ORDER — PROPOFOL 10 MG/ML IV EMUL
10 mg/mL | INTRAVENOUS | Status: DC | PRN
Start: 2013-10-19 — End: 2013-10-19
  Administered 2013-10-19 (×2): via INTRAVENOUS

## 2013-10-19 MED ORDER — DULOXETINE 30 MG CAP, DELAYED RELEASE
30 mg | Freq: Two times a day (BID) | ORAL | Status: DC
Start: 2013-10-19 — End: 2013-10-22
  Administered 2013-10-19 – 2013-10-22 (×6): via ORAL

## 2013-10-19 MED ORDER — FENTANYL CITRATE (PF) 50 MCG/ML IJ SOLN
50 mcg/mL | INTRAMUSCULAR | Status: DC | PRN
Start: 2013-10-19 — End: 2013-10-19
  Administered 2013-10-19 (×6): via INTRAVENOUS

## 2013-10-19 MED ORDER — CLONAZEPAM 0.5 MG TAB
0.5 mg | Freq: Every evening | ORAL | Status: DC
Start: 2013-10-19 — End: 2013-10-22
  Administered 2013-10-20 – 2013-10-22 (×3): via ORAL

## 2013-10-19 MED ORDER — SODIUM CHLORIDE 0.9 % IJ SYRG
Freq: Three times a day (TID) | INTRAMUSCULAR | Status: DC
Start: 2013-10-19 — End: 2013-10-22
  Administered 2013-10-19 – 2013-10-22 (×9): via INTRAVENOUS

## 2013-10-19 MED ORDER — OXYCODONE-ACETAMINOPHEN 5 MG-325 MG TAB
5-325 mg | ORAL | Status: DC | PRN
Start: 2013-10-19 — End: 2013-10-22
  Administered 2013-10-20 – 2013-10-22 (×4): via ORAL

## 2013-10-19 MED ORDER — ONDANSETRON (PF) 4 MG/2 ML INJECTION
4 mg/2 mL | INTRAMUSCULAR | Status: DC | PRN
Start: 2013-10-19 — End: 2013-10-19

## 2013-10-19 MED ORDER — SODIUM CHLORIDE 0.9 % IJ SYRG
INTRAMUSCULAR | Status: DC | PRN
Start: 2013-10-19 — End: 2013-10-19

## 2013-10-19 MED ORDER — DEXAMETHASONE SODIUM PHOSPHATE 10 MG/ML IJ SOLN
10 mg/mL | INTRAMUSCULAR | Status: DC | PRN
Start: 2013-10-19 — End: 2013-10-19
  Administered 2013-10-19: 15:00:00 via INTRAVENOUS

## 2013-10-19 MED ORDER — DEXTROSE 5%-LACTATED RINGERS IV
INTRAVENOUS | Status: AC
Start: 2013-10-19 — End: 2013-10-20
  Administered 2013-10-19 – 2013-10-20 (×4): via INTRAVENOUS

## 2013-10-19 MED ORDER — EPHEDRINE SULFATE 50 MG/ML IJ SOLN
50 mg/mL | INTRAMUSCULAR | Status: DC | PRN
Start: 2013-10-19 — End: 2013-10-19
  Administered 2013-10-19 (×2): via INTRAVENOUS

## 2013-10-19 MED ORDER — ONDANSETRON (PF) 4 MG/2 ML INJECTION
4 mg/2 mL | INTRAMUSCULAR | Status: DC | PRN
Start: 2013-10-19 — End: 2013-10-19
  Administered 2013-10-19: 17:00:00 via INTRAVENOUS

## 2013-10-19 MED ORDER — HYDROMORPHONE (PF) 1 MG/ML IJ SOLN
1 mg/mL | INTRAMUSCULAR | Status: DC | PRN
Start: 2013-10-19 — End: 2013-10-19

## 2013-10-19 MED ORDER — ESOMEPRAZOLE MAGNESIUM 40 MG CAP, DELAYED RELEASE
40 mg | Freq: Every evening | ORAL | Status: DC
Start: 2013-10-19 — End: 2013-10-19

## 2013-10-19 MED ORDER — ROCURONIUM 10 MG/ML IV
10 mg/mL | INTRAVENOUS | Status: AC
Start: 2013-10-19 — End: ?

## 2013-10-19 MED ORDER — LIDOCAINE (PF) 10 MG/ML (1 %) IJ SOLN
10 mg/mL (1 %) | INTRAMUSCULAR | Status: DC | PRN
Start: 2013-10-19 — End: 2013-10-19

## 2013-10-19 MED ORDER — HYDROCODONE-ACETAMINOPHEN 7.5 MG-325 MG TAB
Freq: Four times a day (QID) | ORAL | Status: DC | PRN
Start: 2013-10-19 — End: 2013-10-22

## 2013-10-19 MED ORDER — SUCCINYLCHOLINE CHLORIDE 20 MG/ML INJECTION
20 mg/mL | INTRAMUSCULAR | Status: AC
Start: 2013-10-19 — End: ?

## 2013-10-19 MED ORDER — SUCCINYLCHOLINE CHLORIDE 20 MG/ML INJECTION
20 mg/mL | INTRAMUSCULAR | Status: DC | PRN
Start: 2013-10-19 — End: 2013-10-19
  Administered 2013-10-19: 15:00:00 via INTRAVENOUS

## 2013-10-19 MED ORDER — MIDAZOLAM 1 MG/ML IJ SOLN
1 mg/mL | INTRAMUSCULAR | Status: DC | PRN
Start: 2013-10-19 — End: 2013-10-19
  Administered 2013-10-19 (×3): via INTRAVENOUS

## 2013-10-19 MED ORDER — OXYCODONE-ACETAMINOPHEN 5 MG-325 MG TAB
5-325 mg | ORAL | Status: DC | PRN
Start: 2013-10-19 — End: 2013-10-22
  Administered 2013-10-21 – 2013-10-22 (×6): via ORAL

## 2013-10-19 MED ORDER — FLUMAZENIL 0.1 MG/ML IV SOLN
0.1 mg/mL | INTRAVENOUS | Status: DC | PRN
Start: 2013-10-19 — End: 2013-10-19

## 2013-10-19 MED ORDER — LIDOCAINE (PF) 20 MG/ML (2 %) IV SYRINGE
100 mg/5 mL (2 %) | INTRAVENOUS | Status: AC
Start: 2013-10-19 — End: ?

## 2013-10-19 MED ADMIN — lidocaine (PF) (XYLOCAINE) 20 mg/mL (2 %) injection: INTRAVENOUS | @ 15:00:00 | NDC 63323020805

## 2013-10-19 MED ADMIN — lactated ringers infusion: INTRAVENOUS | @ 16:00:00 | NDC 00338011704

## 2013-10-19 MED ADMIN — rocuronium (ZEMURON) injection: INTRAVENOUS | @ 15:00:00 | NDC 10139023505

## 2013-10-19 MED ADMIN — lactated ringers infusion: INTRAVENOUS | @ 13:00:00 | NDC 00338011704

## 2013-10-19 NOTE — Consults (Signed)
Consult History and Physical Exam    NAME:             Jill Prince   DOB:   07/31/1941   MRN:  [id number]     PCP:  Sol Blazing, MD   Referring: Brien Few, MD     Date/Time:  10/19/2013      Subjective:     REASON FOR CONSULT: Post operative medical management    CHIEF COMPLAINT:  Left knee discomfort post op    HISTORY OF PRESENT ILLNESS:     Jill Prince is a 72 y.o.  Caucasian female who is admitted to the orthopedic service with DJD left ankle.  Jill Prince is being seen in medical consultation. She has a Hx HTN which has been well controlled. Hypothyroidism, GERD, depression and unspecified neuropathy. She has had close follow up with no major concerns. She has mild left knee discomfort as expected. She has intermittent neuropathic symptoms, barely controlled by her current medications. She has no chest discomfort, nausea, vomiting or any other complaints.    Allergies   Allergen Reactions   ??? Statins-Hmg-Coa Reductase Inhibitors Myalgia   ??? Sulfa (Sulfonamide Antibiotics) Itching     " I itch."        Prior to Admission medications    Medication Sig Start Date End Date Taking? Authorizing Provider   pregabalin (LYRICA) 150 mg capsule Take 150 mg by mouth three (3) times daily.   Yes Historical Provider   lisinopril-hydrochlorothiazide (PRINZIDE, ZESTORETIC) 10-12.5 mg per tablet Take 1 tablet by mouth daily (with breakfast).   Yes Historical Provider   levothyroxine (SYNTHROID) 100 mcg tablet Take 100 mcg by mouth Daily (before breakfast).   Yes Historical Provider   multivitamin (ONE A DAY) tablet Take 1 tablet by mouth daily (with breakfast).   Yes Historical Provider   duloxetine (CYMBALTA) 60 mg capsule Take 60 mg by mouth two (2) times a day.   Yes Historical Provider   alprazolam (XANAX) 0.25 mg tablet Take 0.25 mg by mouth nightly as needed for Anxiety.   Yes Historical Provider   esomeprazole (NEXIUM) 40 mg capsule Take 40 mg by mouth nightly.   Yes Historical  Provider   senna-docusate (PERICOLACE) 8.6-50 mg per tablet Take 3 tablets by mouth nightly.   Yes Historical Provider   clonazepam (KLONOPIN) 1 mg tablet Take 1 mg by mouth nightly.   Yes Historical Provider   hydrocodone-acetaminophen (NORCO) 7.5-325 mg per tablet Take 1 tablet by mouth every six (6) hours as needed for Pain.   Yes Historical Provider       Past Medical History   Diagnosis Date   ??? Hypertension    ??? Chronic pain      Peripheral Neuropathy   ??? GERD (gastroesophageal reflux disease)    ??? Thyroid disease    ??? Nausea & vomiting    ??? Depression         Past Surgical History   Procedure Laterality Date   ??? Hx cholecystectomy  1975     Open   ??? Pr breast surgery procedure unlisted Left 1976     Lumpectomy   ??? Hx hysterectomy  1976   ??? Hx oophorectomy Left 1977   ??? Hx orthopaedic Bilateral 1990's     Bunionectomy   ??? Hx back surgery  2013 & 2014     Total of 4 lumbar fusion surgeries   ??? Hx knee arthroscopy Bilateral        Social  History   Substance Use Topics   ??? Smoking status: Never Smoker    ??? Smokeless tobacco: Never Used   ??? Alcohol Use: No      Pertinent family history: sister had dementia     Review of Systems:    Constitutional ROS: no fever, chills, rigors or night sweats  Respiratory ROS: no cough, sputum, hemoptysis, dyspnea or pleuritic pain.  Cardiovascular ROS: no chest pain, palpitation, orthopnea, PND or syncope  Endocrine ROS: no polydispsia, polyuria, heat or cold intolerance   Gastrointestinal ROS: no dysphagia or odynophagia, abdominal pain, nausea, vomiting, diarrhea, dysphagia, hematemesis, melena, or hematochezia  Genito-Urinary ROS: no dysuria, frequency, hematuria, retention, or flank pain  Musculoskeletal ROS: left knee discomfort  Neurological ROS: no headache, confusion, diplopia, dysarthria, aphasia, facial droop, focal weakness  Psychiatric ROS: ROS: no depression, anxiety, mood swings  Dermatological ROS: no rash, pruritis, or urticaria         Objective:      VITALS:     Vital signs reviewed; most recent are:    Visit Vitals   Item Reading   ??? BP 114/54   ??? Pulse 98   ??? Temp 97.7 ??F (36.5 ??C)   ??? Resp 14   ??? Ht 5\' 6"  (1.676 m)   ??? Wt 104.781 kg (231 lb)   ??? BMI 37.3 kg/m2   ??? SpO2 94%     SpO2 Readings from Last 6 Encounters:   10/19/13 94%   10/19/13 94%    O2 Flow Rate (L/min): 2 l/min     Intake/Output Summary (Last 24 hours) at 10/19/13 1348  Last data filed at 10/19/13 1250   Gross per 24 hour   Intake   1500 ml   Output      0 ml   Net   1500 ml        Exam:     Physical Exam:    Gen:  well-developed and nourished patient in no acute distress  Eyes: pink conjunctivae, PERRLA with no discharge.  ENT:  no ottorrhea or rhinorrhea, moist mucous membranes  Neck:  Supple, no masses, thyroid non-tender with a central trachea.  Pulm:  clear breath sounds without crackles or wheezes  Card:  no JVD or murmurs, has regular and normal S1, S2 without thrills, bruits or pedal edema  Abd:  Soft, non-tender, non-distended, normoactive bowel sounds are present, no palpable organomegaly  Musc:  No cyanosis or clubbing. No atrophy or deformities.  Skin:  No rashes, bruising or ulcers, skin turgor is good  Neuro: Awake and alert, CNs 2-12 intact with a non focal exam. Follows commands appropriately  Psych: Oriented x 3, no hallucinations or delusions.    Labs:    No results found for this basename: WBC, HGB, HCT, PLT,  in the last 72 hours  No results found for this basename: NA, K, CL, CO2, GLU, BUN, CREA, CA, MG, PHOS, ALB, TBIL, SGOT, ALT,  in the last 72 hours  No components found with this basename: glpoc       No results found for this basename: INR,  in the last 72 hours    Other diagnostics:       Assessment/Recommendations:       Jill Prince is a 72 y.o.  Caucasian female being reviewed for:       Arthritis of ankle or foot, left, 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/2013)/ Arthritis of foot, 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/2013)   - primarily being managed by the primary ortho team who  are handling pain control,  DVT prophylaxis and discharge planning   - obtain basic labs in AM - CBC and a CMP      GERD (gastroesophageal reflux disease) (10/19/2013)   - no new symptoms   - resume esomeprazole      HTN (hypertension), benign (10/19/2013)   - BP is stable   - resume lisinopril-hydrochlorothiazide      Depression (10/19/2013)   - resume klonipin, hold clonazepam as she is on pain medications which may potentially sedate and closely monitor      Neuropathy (10/19/2013)   - resume lyrica and cymbalta      Hypothyroidism (10/19/2013)   - obtain a TSH and resume levothyroxine       Thank you for the privilege to participate in Jill Prince's care. We will follow along with you.    Total time spent for care of the patient: 71 Minutes                  Care Plan discussed with: Patient and Nursing Staff    Discussed:  Care Plan    Prophylaxis:  SCD's           ___________________________________________________    Attending Physician: Melynda Keller, MD

## 2013-10-19 NOTE — Other (Signed)
TRANSFER - OUT REPORT:    Verbal report given to Herbert Seta, RN on Jill Prince  being transferred to 432 for routine post - op       Report consisted of patient???s Situation, Background, Assessment and   Recommendations(SBAR).     Information from the following report(s) SBAR, OR Summary, Intake/Output and MAR was reviewed with the receiving nurse.    Opportunity for questions and clarification was provided.

## 2013-10-19 NOTE — Other (Signed)
Bedside and Verbal shift change report given to Lanora Manis (Cabin crew) by Herbert Seta (offgoing nurse). Report included the following information SBAR, Procedure Summary, Intake/Output, MAR and Recent Results.

## 2013-10-19 NOTE — Op Note (Signed)
dictated

## 2013-10-19 NOTE — Anesthesia Post-Procedure Evaluation (Signed)
Post-Anesthesia Evaluation and Assessment    Patient: Jill Prince MRN: [id number]  SSN: [id number]    Date of Birth: 10-05-1941  Age: 72 y.o.  Sex: female       Cardiovascular Function/Vital Signs  Visit Vitals   Item Reading   ??? BP 102/50   ??? Pulse 102   ??? Temp 36.9 ??C (98.4 ??F)   ??? Resp 14   ??? Ht 5\' 6"  (1.676 m)   ??? Wt 104.781 kg (231 lb)   ??? BMI 37.3 kg/m2   ??? SpO2 94%       Patient is status post general anesthesia for Procedure(s):  LEFT DOUBLE ANKLE ARTHRODESIS  (GEN W/POP BLK).    Nausea/Vomiting: None    Postoperative hydration reviewed and adequate.    Pain:  Pain Scale 1: Numeric (0 - 10) (10/19/13 1251)  Pain Intensity 1: 0 (10/19/13 1251)   Managed    Neurological Status:   Neuro (WDL): Within Defined Limits (10/19/13 1251)  Neuro  LLE Motor Response: Numbness (10/19/13 1251)   At baseline    Mental Status and Level of Consciousness: Alert and oriented     Pulmonary Status:   O2 Device: Nasal cannula (10/19/13 1251)   Adequate oxygenation and airway patent    Complications related to anesthesia: None    Post-anesthesia assessment completed. No concerns    Signed By: Glendora Score, MD     October 19, 2013

## 2013-10-19 NOTE — Anesthesia Pre-Procedure Evaluation (Signed)
Anesthetic History     PONV         Review of Systems / Medical History  Patient summary reviewed, nursing notes reviewed and pertinent labs reviewed    Pulmonary  Within defined limits        Shortness of breath       Neuro/Psych   Within defined limits           Cardiovascular    Hypertension                 GI/Hepatic/Renal     GERD             Endo/Other      Hypothyroidism       Other Findings            Physical Exam    Airway  Mallampati: II  TM Distance: 4 - 6 cm  Neck ROM: normal range of motion   Mouth opening: Normal     Cardiovascular    Rhythm: regular  Rate: normal         Dental  No notable dental hx  Dentition: Full lower dentures and Full upper dentures     Pulmonary  Breath sounds clear to auscultation               Abdominal         Other Findings            Anesthetic Plan    ASA: 2  Anesthesia type: general      Post-op pain plan if not by surgeon: peripheral nerve block single      Anesthetic plan and risks discussed with: Patient

## 2013-10-19 NOTE — Brief Op Note (Signed)
BRIEF OPERATIVE NOTE    Date of Procedure: 10/19/2013   Preoperative Diagnosis: ADULT AQUIRED FLAT FOOT, POSTERIOR TIBIAL TENDON DYSFUNCTION  Postoperative Diagnosis: ADULT AQUIRED FLAT FOOT, POSTERIOR TIBIAL TENDON     Procedure(s):  LEFT DOUBLE ANKLE ARTHRODESIS  (GEN W/POP BLK)  Surgeon(s) and Role:     * Brien Few, MD - Primary  Anesthesia: General   Estimated Blood Loss: 0  Specimens: * No specimens in log *   Findings: 0   Complications: 0  Implants:   Implant Name Type Inv. Item Serial No. Manufacturer Lot No. LRB No. Used Action   SCR BNE CANN LNG THRD 4X50MM -- SS - SN/A  [id number] n/a [employer] [place] n/a Left 2 Implanted   SCR BNE CANN LNG THRD [id number] -- SS - SN/A  2332 n/a [employer] [place] n/a Left 1 Implanted   SCR BNE CANN LNG THRD [id number] -- SS - SN/A   [id number] n/a [employer] [place] n/a Left 1 Implanted

## 2013-10-19 NOTE — Anesthesia Procedure Notes (Addendum)
Peripheral Block    Start time: 10/19/2013 8:23 AM  End time: 10/19/2013 8:37 AM  Block Type: left popliteal  Reason for block: at surgeon's request and post-op pain management  Staffing  Anesthesiologist: STEIN, ETHAN  Performed by: anesthesiologist   Prep  Risks and benefits discussed with the patient and plans are to proceed  Site marked, Timeout performed, 08:23  Monitoring: continuous pulse ox, frequent vital sign checks, heart rate, responsive to questions and oxygen  Injection Technique: single-shot  Procedures: ultrasound guided and nerve stimulator  Patient was placed in supine position  Prep Solution(s): chlorhexidine  Region: lower thigh (posterior)  Needle  Needle: 22G Stimuplex  Needle localization: nerve stimulator and ultrasound guidance  Minimal motor response <0.5 mA and >0.3 mA  Medication Injected: 30mL 0.5% ropivacaine    Assessment  Injection Assessment: incremental injection every 5 mL, local visualized surrounding nerve on ultrasound, negative aspiration for blood, no intravascular symptoms and no paresthesia  Patient tolerated without any apparent complications  Additional Notes  Saphenous block performed with ultrasound guidance; 4" stimuplex 21g needle used, 10cc 0.5% ropivacaine injected slowly with intermittent aspiration.

## 2013-10-19 NOTE — Op Note (Signed)
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    PROCEDURE PERFORMED: Double arthrodesis, left foot.    SURGEON: Jill Gosney Jill Van Manen, MD    ANESTHESIA: General with popliteal block.    COMPLICATIONS: None encountered.    TOURNIQUET TIME: 94 minutes.    ESTIMATED BLOOD LOSS: Nil.    COMPLICATIONS: None.    SPECIMENS REMOVED: None.    DESCRIPTION OF PROCEDURE: After consents were obtained and preoperative  sedation, the patient was taken to the operating suites and underwent a  popliteal block and then general anesthesia without difficulty. A pneumatic  tourniquet was placed around the left thigh, and the left lower extremity  was scrubbed and draped in the usual sterile fashion. After Esmarch  exsanguination, the tourniquet was inflated to 350 mmHg.    Initial incision was longitudinal, centered over the calcaneocuboid joint.  The incision was made at the line between the distal tip of the distal  fibula and the base of the fourth metatarsal. The incision was carried  through skin and subcutaneous tissue down to the deep fascia, where the  deep fascia was divided in the interval between the extensor digitorum  brevis muscle belly and the peroneus brevis tendon. The incision was made  down to bone and subperiosteal dissection was carried out, exposing the  bone and the joint. The joint was distracted, facilitated with lamina  spreaders, and the articular cartilage takedown was performed with  osteotomes and  curettes.    Attention was then directed medially, where a longitudinal incision was  made in a line between the distal tip of the medial malleolus and the  medial border of the foot, centered over the talonavicular joint. The  incision was carried through skin and subcutaneous tissue down to the deep  fascia. The deep fascia was divided along the dorsal margin of the remnants  of the posterior tibial tendon and down to bone over the talar head and  navicular. Subperiosteal dissection was carried out, exposing the joint and  the bones. Elevation of the capsule was performed anteriorly and  plantarward with a Cobb elevator. The tuberosity of the navicular was  removed and the joint was distracted. Articular cartilage takedown was  carried out with osteotomes and curettes. After the articular cartilage  takedown, we were able to position the foot from the   severe flat foot  hindfoot deformity to a more neutral position. Both joints were then power  burred in their subchondral bone to create good bleeding bone, and after  power burring the bone, the joints were positioned at the talonavicular  joint and pinned with guidewires from the 4 mm cannulated screw set. With  C-arm image intensifier, we documented the optimal position of the wires  and the position of the talonavicular joint being in neutral to slight  valgus.    Screw length was measured. Cannulated drilling was performed over the  guidewires, and two 4 mm cannulated screws from the [employer]ynthes instrument set  were placed over the guidewire, and screwed into position and both screws  obtained excellent compression on tightening of the screw. Guidewires were  removed. Intraoperative x-ray showed optimal position of the screws.    Attention laterally was carried, out where the calcaneocuboid joint was  positioned in neutral, and 2 guidewires from the 4 mm cannulated screw set  were placed across this articulation. Again, position of the guidewires was  optimal as  seen on C-arm image intensifier and drilling, and screw  placement was placed after appropriate screw length was measured. After the  screws were placed, the C-arm image intensifier was used to check position  of the foot and screws, and good position was noted.    The wounds were copiously irrigated. Both wounds were closed in layers  using 2-0 Monocryl suture in interrupted fashion for the deep fascia, 3-0  Monocryl for the superficial fascia, and skin approximating staples for the  skin. A bulky compressive postoperative dressing with plaster splints was  applied. The tourniquet was deflated, and the patient was awakened from  anesthesia and taken to the recovery room in satisfactory condition.          Jill Mo Jill Jaloni Sorber, MD    cc:   Jill Woodbury Jill Alizae Bechtel, MD        WJV/wmx; D: 10/19/2013 11:57 A; T: 10/19/2013 12:42 P; Doc# [id number]; Job#  [id number]

## 2013-10-19 NOTE — Other (Signed)
Two nurse skin assessment performed by Guy Begin, RN and Vella Raring, RN. No pressure ulcers noted.

## 2013-10-19 NOTE — Anesthesia Procedure Notes (Signed)
 Peripheral Block    Start time: 10/19/2013 8:23 AM  End time: 10/19/2013 8:37 AM  Block Type: left popliteal  Reason for block: at surgeon's request and post-op pain management  Staffing  Anesthesiologist: STEIN, ETHAN  Performed by: anesthesiologist   Prep  Risks and benefits discussed with the patient and plans are to proceed  Site marked, Timeout performed, 08:23  Monitoring: continuous pulse ox, frequent vital sign checks, heart rate, responsive to questions and oxygen  Injection Technique: single-shot  Procedures: ultrasound guided and nerve stimulator  Patient was placed in supine position  Prep Solution(s): chlorhexidine  Region: lower thigh (posterior)  Needle  Needle: 22G Stimuplex  Needle localization: nerve stimulator and ultrasound guidance  Minimal motor response <0.5 mA and >0.3 mA  Medication Injected: 30mL 0.5% ropivacaine    Assessment  Injection Assessment: incremental injection every 5 mL, local visualized surrounding nerve on ultrasound, negative aspiration for blood, no intravascular symptoms and no paresthesia  Patient tolerated without any apparent complications  Additional Notes  Saphenous block performed with ultrasound guidance; 4 stimuplex 21g needle used, 10cc 0.5% ropivacaine injected slowly with intermittent aspiration.

## 2013-10-19 NOTE — Op Note (Signed)
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    PROCEDURE PERFORMED: Double arthrodesis, left foot.    SURGEON: Angie Fava, MD    ANESTHESIA: General with popliteal block.    COMPLICATIONS: None encountered.    TOURNIQUET TIME: 94 minutes.    ESTIMATED BLOOD LOSS: Nil.    COMPLICATIONS: None.    SPECIMENS REMOVED: None.    DESCRIPTION OF PROCEDURE: After consents were obtained and preoperative  sedation, the patient was taken to the operating suites and underwent a  popliteal block and then general anesthesia without difficulty. A pneumatic  tourniquet was placed around the left thigh, and the left lower extremity  was scrubbed and draped in the usual sterile fashion. After Esmarch  exsanguination, the tourniquet was inflated to 350 mmHg.    Initial incision was longitudinal, centered over the calcaneocuboid joint.  The incision was made at the line between the distal tip of the distal  fibula and the base of the fourth metatarsal. The incision was carried  through skin and subcutaneous tissue down to the deep fascia, where the  deep fascia was divided in the interval between the extensor digitorum  brevis muscle belly and the peroneus brevis tendon. The incision was made  down to bone and subperiosteal dissection was carried out, exposing the  bone and the joint. The joint was distracted, facilitated with lamina  spreaders, and the articular cartilage takedown was performed with  osteotomes and  curettes.    Attention was then directed medially, where a longitudinal incision was  made in a line between the distal tip of the medial malleolus and the  medial border of the foot, centered over the talonavicular joint. The  incision was carried through skin and subcutaneous tissue down to the deep  fascia. The deep fascia was divided along the dorsal margin of the remnants  of the posterior tibial tendon and down to bone over the talar head and  navicular. Subperiosteal dissection was carried out, exposing the joint and  the bones. Elevation of the capsule was performed anteriorly and  plantarward with a Cobb elevator. The tuberosity of the navicular was  removed and the joint was distracted. Articular cartilage takedown was  carried out with osteotomes and curettes. After the articular cartilage  takedown, we were able to position the foot from the  severe flat foot  hindfoot deformity to a more neutral position. Both joints were then power  burred in their subchondral bone to create good bleeding bone, and after  power burring the bone, the joints were positioned at the talonavicular  joint and pinned with guidewires from the 4 mm cannulated screw set. With  C-arm image intensifier, we documented the optimal position of the wires  and the position of the talonavicular joint being in neutral to slight  valgus.    Screw length was measured. Cannulated drilling was performed over the  guidewires, and two 4 mm cannulated screws from the [employer]ynthe[employer] instrument set  were placed over the guidewire, and screwed into position and both screws  obtained excellent compression on tightening of the screw. Guidewires were  removed. Intraoperative x-ray showed optimal position of the screws.    Attention laterally was carried, out where the calcaneocuboid joint was  positioned in neutral, and 2 guidewires from the 4 mm cannulated screw set  were placed across this articulation. Again, position of the guidewires was  optimal as  seen on C-arm image intensifier and drilling, and screw  placement was placed after appropriate screw length was measured. After the  screws were placed, the C-arm image intensifier was used to check position  of the foot and screws, and good position was noted.    The wounds were copiously irrigated. Both wounds were closed in layers  using 2-0 Monocryl suture in interrupted fashion for the deep fascia, 3-0  Monocryl for the superficial fascia, and skin approximating staples for the  skin. A bulky compressive postoperative dressing with plaster splints was  applied. The tourniquet was deflated, and the patient was awakened from  anesthesia and taken to the recovery room in satisfactory condition.          Rico Sheehan, MD    cc:   Rico Sheehan, MD        WJV/wmx; D: 10/19/2013 11:57 A; T: 10/19/2013 12:42 P; Doc# [id number]; Job#  [id number]

## 2013-10-20 DIAGNOSIS — D649 Anemia, unspecified: Secondary | ICD-10-CM | POA: Insufficient documentation

## 2013-10-20 LAB — METABOLIC PANEL, COMPREHENSIVE
A-G Ratio: 0.9 — ABNORMAL LOW (ref 1.1–2.2)
ALT (SGPT): 32 U/L (ref 12–78)
AST (SGOT): 18 U/L (ref 15–37)
Albumin: 2.8 g/dL — ABNORMAL LOW (ref 3.5–5.0)
Alk. phosphatase: 109 U/L (ref 45–117)
Anion gap: 8 mmol/L (ref 5–15)
BUN/Creatinine ratio: 17 (ref 12–20)
BUN: 12 MG/DL (ref 6–20)
Bilirubin, total: 0.2 MG/DL (ref 0.2–1.0)
CO2: 31 mmol/L (ref 21–32)
Calcium: 9 MG/DL (ref 8.5–10.1)
Chloride: 99 mmol/L (ref 97–108)
Creatinine: 0.69 MG/DL (ref 0.45–1.15)
GFR est AA: >60 mL/min/{1.73_m2} (ref >60)
GFR est non-AA: >60 mL/min/{1.73_m2} (ref >60)
Globulin: 3.2 g/dL (ref 2.0–4.0)
Glucose: 157 mg/dL — ABNORMAL HIGH (ref 65–100)
Potassium: 4.1 mmol/L (ref 3.5–5.1)
Protein, total: 6 g/dL — ABNORMAL LOW (ref 6.4–8.2)
Sodium: 138 mmol/L (ref 136–145)

## 2013-10-20 LAB — TSH 3RD GENERATION: TSH: 0.38 u[IU]/mL (ref 0.36–3.74)

## 2013-10-20 LAB — CBC W/O DIFF
HCT: 33 % — ABNORMAL LOW (ref 35.0–47.0)
HGB: 10.4 g/dL — ABNORMAL LOW (ref 11.5–16.0)
MCH: 30.2 PG (ref 26.0–34.0)
MCHC: 31.5 g/dL (ref 30.0–36.5)
MCV: 95.9 FL (ref 80.0–99.0)
PLATELET: 209 10*3/uL (ref 150–400)
RBC: 3.44 M/uL — ABNORMAL LOW (ref 3.80–5.20)
RDW: 14.7 % — ABNORMAL HIGH (ref 11.5–14.5)
WBC: 10 10*3/uL (ref 3.6–11.0)

## 2013-10-20 MED ORDER — LISINOPRIL 5 MG TAB
5 mg | Freq: Every day | ORAL | Status: DC
Start: 2013-10-20 — End: 2013-10-22
  Administered 2013-10-21 – 2013-10-22 (×2): via ORAL

## 2013-10-20 MED ADMIN — pantoprazole (PROTONIX) tablet 40 mg: ORAL | @ 03:00:00 | NDC 62175018146

## 2013-10-20 NOTE — Progress Notes (Signed)
[facility]  [address] [place], [place] [address]  [phone]      Medical Progress Note      NAME: Jill Prince   DOB:  September 05, 1941  MRM:  [id number]    Date/Time: 10/20/2013  2:20 PM       Assessment and Plan:     Arthritis of foot, degenerative / Arthritis of ankle or foot, left, degenerative - Ortho is attending to this issue.  They will be in charge of pain control, DVT prophylaxis and rehab decisions.  Currently she is not needing PCA.      Anemia - Mild.  No known hx of anemia.  Check serologies and hemoccult.    Sinus tachycardia / HTN (hypertension) - Unclear reason for tachycardia.  Hydrate, and hold HCTZ for now.  May need to convert to BB if persistent.  Continue lisinopril.    Neuropathy - Present for years.  Continue Lyrica    Hypothyroidism - Continue synthroid.  TSH normal.    GERD (gastroesophageal reflux disease) - Continue PPI    Depression - Continue duloxetene and prn clonazepam       Subjective:     Chief Complaint:  Feeling well, foot still mostly numb    ROS:  (bold if positive, if negative)      Tolerating minimal PT Tolerating Diet        Objective:     Last 24hrs VS reviewed since prior progress note. Most recent are:    Visit Vitals   Item Reading   ??? BP 126/60   ??? Pulse 101   ??? Temp 98.4 ??F (36.9 ??C)   ??? Resp 18   ??? Ht 5\' 6"  (1.676 m)   ??? Wt 104.781 kg (231 lb)   ??? BMI 37.3 kg/m2   ??? SpO2 94%     SpO2 Readings from Last 6 Encounters:   10/20/13 94%   10/20/13 94%    O2 Flow Rate (L/min): 2 l/min     Intake/Output Summary (Last 24 hours) at 10/20/13 1420  Last data filed at 10/20/13 1029   Gross per 24 hour   Intake   1200 ml   Output   1200 ml   Net      0 ml        Physical Exam:    Gen:  Obese, well-nourished, in no acute distress  HEENT:  Pink conjunctivae, PERRL, hearing intact to voice, moist mucous membranes  Neck:  Supple, without masses, thyroid non-tender  Resp:  No accessory muscle use, clear breath sounds without wheezes rales or rhonchi   Card:  No murmurs, tachycardic S1, S2 without thrills, bruits or peripheral edema  Abd:  Soft, non-tender, non-distended, normoactive bowel sounds are present, no mass  Lymph:  No cervical or inguinal adenopathy  Musc:  No cyanosis or clubbing, LLE in dressing, CDI  Skin:  No rashes or ulcers, skin turgor is good  Neuro:  Cranial nerves are grossly intact, post op motor weakness, follows commands appropriately  Psych:  Good insight, oriented to person, place and time, alert    Telemetry reviewed:   normal sinus rhythm  __________________________________________________________________  Medications Reviewed: (see below)  Medications:     Current Facility-Administered Medications   Medication Dose Route Frequency   ??? [START ON 10/21/2013] lisinopril (PRINIVIL, ZESTRIL) tablet 10 mg  10 mg Oral DAILY   ??? pregabalin (LYRICA) capsule 150 mg  150 mg Oral TID   ??? levothyroxine (  SYNTHROID) tablet 100 mcg  100 mcg Oral ACB   ??? multivitamin with iron tablet 1 tablet  1 tablet Oral DAILY WITH BREAKFAST   ??? DULoxetine (CYMBALTA) capsule 60 mg  60 mg Oral BID   ??? senna-docusate (PERICOLACE) 8.6-50 mg per tablet 3 tablet  3 tablet Oral QHS   ??? clonazePAM (KlonoPIN) tablet 1 mg  1 mg Oral QHS   ??? HYDROcodone-acetaminophen (NORCO) 7.5-325 mg per tablet 1 tablet  1 tablet Oral Q6H PRN   ??? [EXPIRED] dextrose 5% lactated ringers infusion  125 mL/hr IntraVENous CONTINUOUS   ??? sodium chloride (NS) flush 5-10 mL  5-10 mL IntraVENous Q8H   ??? sodium chloride (NS) flush 5-10 mL  5-10 mL IntraVENous PRN   ??? morphine (PF) PCA 150 mg/30 ml   IntraVENous TITRATE   ??? pantoprazole (PROTONIX) tablet 40 mg  40 mg Oral QHS   ??? oxyCODONE-acetaminophen (PERCOCET) 5-325 mg per tablet 1 tablet  1 tablet Oral Q3H PRN   ??? oxyCODONE-acetaminophen (PERCOCET) 5-325 mg per tablet 2 tablet  2 tablet Oral Q3H PRN        Lab Data Reviewed: (see below)  Lab Review:     Recent Labs      10/20/13   0245   WBC  10.0   HGB  10.4*   HCT  33.0*   PLT  209     Recent  Labs      10/20/13   0245   NA  138   K  4.1   CL  99   CO2  31   GLU  157*   BUN  12   CREA  0.69   CA  9.0   ALB  2.8*   TBILI  0.2   SGOT  18   ALT  32       Other pertinent lab: none    Total time spent with patient: 68 Minutes                  Care Plan discussed with: Patient, Nursing Staff and >50% of time spent in counseling and coordination of care    Discussed:  Care Plan    Prophylaxis:  SCD's and H2B/PPI    Disposition:  Home w/Family           ___________________________________________________    Attending Physician: Salvadore Dom, MD

## 2013-10-20 NOTE — Progress Notes (Signed)
Bedside, Verbal, Recorded and Written shift change report given to hope rn   (oncoming nurse) by Ricard Dillon   (offgoing nurse). Report included the following information Kardex, Procedure Summary, Intake/Output, MAR, Accordion and Recent Results.

## 2013-10-20 NOTE — Progress Notes (Signed)
Orthopaedic Progress Note  Post Op day: 1 Day Post-Op    October 20, 2013 10:22 AM     Patient: Jill Prince MRN: [id number]  SSN: [id number]    Date of Birth: 08-23-1941  Age: 72 y.o.  Sex: female      Admit date:  10/19/2013  Date of Surgery:  10/19/2013   Procedures:  Procedure(s):  LEFT DOUBLE ANKLE ARTHRODESIS  (GEN W/POP BLK)  Admitting Physician:  Brien Few, MD   Surgeon:  Moishe Spice) and Role:     * Brien Few, MD - Primary    Consulting Physician(s): Treatment Team: Attending Provider: Brien Few, MD; Consulting Provider: Melynda Keller, MD    SUBJECTIVE:     Jill Prince is a 72 y.o. female is 1 Day Post-Op s/p Procedure(s): LEFT DOUBLE ANKLE ARTHRODESIS (GEN W/POP BLK) with an appropriate level of post-operative pain. Pt states she currently has "no pain". No complaints of nausea, vomiting, dizziness, lightheadedness, chest pain, or shortness of breath.    OBJECTIVE:       Physical Exam:  General: Alert, cooperative, no distress.    Respiratory: Respirations unlabored  Neurological:  Moving toes freely. Still without sensation in toes 2/2 nerve block   Dressing/Wound:  Clean, dry and intact.    Vital Signs:      Patient Vitals for the past 8 hrs:   BP Temp Pulse Resp SpO2   10/20/13 0902 126/60 mmHg - 101 - -   10/20/13 0733 118/58 mmHg 98.4 ??F (36.9 ??C) 98 18 94 %   10/20/13 0402 110/56 mmHg 98 ??F (36.7 ??C) 94 16 93 %                                          Temp (24hrs), Avg:98.1 ??F (36.7 ??C), Min:97.6 ??F (36.4 ??C), Max:98.4 ??F (36.9 ??C)      Labs:        Recent Labs      10/20/13   0245   HCT  33.0*   HGB  10.4*     Lab Results   Component Value Date/Time    Sodium 138 10/20/2013  2:45 AM    Potassium 4.1 10/20/2013  2:45 AM    Chloride 99 10/20/2013  2:45 AM    CO2 31 10/20/2013  2:45 AM    Glucose 157 10/20/2013  2:45 AM    BUN 12 10/20/2013  2:45 AM    Creatinine 0.69 10/20/2013  2:45 AM    Calcium 9.0 10/20/2013  2:45 AM       PT/OT:        Gait  Base of Support:  Narrowed  Speed/Cadence: Slow  Step Length: Right shortened  Stance: Left decreased  Gait Abnormalities: Decreased step clearance (only able to pivot foot, no foot clearance on R)  Ambulation - Level of Assistance: Moderate assistance (x 2)  Distance (ft): 1 Feet (ft)  Assistive Device: Gait belt;Walker, rolling       Patient mobility  Bed Mobility Training  Supine to Sit: Minimal assistance  Scooting: CGA  Transfer Training  Sit to Stand: Moderate assistance;Minimum assistance;Assist X2;Additional time;Verbal cues  Stand to Sit: Moderate assistance;Assist X2;Additional time;Verbal cues  Bed to Chair: Moderate assistance;Assist X2;Additional time;Verbal cues      Gait Training  Assistive Device: Gait belt;Walker, rolling  Ambulation - Level of Assistance: Moderate assistance (x 2)  Distance (ft): 1  Feet (ft)   Weight Bearing Status  Left Side Weight Bearing: Non-weight bearing        ASSESSMENT / PLAN:   Active Problems:    Arthritis of ankle or foot, left, 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/2013)      Arthritis of foot, 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/2013)      GERD (gastroesophageal reflux disease) (10/19/2013)      HTN (hypertension), benign (10/19/2013)      Depression (10/19/2013)      Neuropathy (10/19/2013)      Hypothyroidism (10/19/2013)         S/P LEFT DOUBLE ANKLE ARTHRODESIS  (GEN W/POP BLK) PT/OT/Rehab. Patient understands and agrees with plan of care.    Pain D/c PCA. Po pain meds prn.    Discharge Disposition D/c to SNF when bed available.      Signed By: Loura Halt, NP    RN, MSN, FNP-BC  Orthopedic Nurse Practitioner  [facility]

## 2013-10-20 NOTE — Progress Notes (Signed)
Problem: Mobility Impaired (Adult and Pediatric)  Goal: *Acute Goals and Plan of Care (Insert Text)  Physical Therapy Goals  Initiated 10/20/2013  1. Patient will move from supine to sit and sit to supine in bed with modified independence within 7 day(s).   2. Patient will transfer from bed to chair and chair to bed with supervision/set-up using the least restrictive device within 7 day(s).  3. Patient will perform sit to stand with supervision/set-up within 7 day(s).  4. Patient will ambulate with supervision/set-up for 20 feet with the least restrictive device within 7 day(s).   5. Patient will ascend/descend 7 stairs with 2 handrail(s) with minimal assistance/contact guard assist within 7 day(s).   PHYSICAL THERAPY TREATMENT  Patient: Jill Prince (72 y.o. female)  Date: 10/20/2013  Diagnosis: ADULT AQUIRED FLAT FOOT, POSTERIOR TIBIAL TENDON DYSFUNCTION  Arthritis of ankle or foot, left, degenerative  Arthritis of foot, degenerative, left Arthritis of foot, degenerative  Procedure(s) (LRB):  LEFT DOUBLE ANKLE ARTHRODESIS  (GEN W/POP BLK) (Left) 1 Day Post-Op  Precautions: NWB;Fall      ASSESSMENT:  Patient received still up in recliner from AM session.  Patient stood with min/mod A x 2.  Patient performed stand pivot transfer on/off commode using RW and min A x 2.  Patient slightly impulsive with stand to sit requires cues for safety.  Patient with increased difficulty maintaining NWB status with sit to stand and pivot transfers this session.  Reviewed seated exercises as described below.  Patient requested to stay up in recliner at end of session.  Will benefit from SNF at discharge.  Progression toward goals:  [ ]     Improving appropriately and progressing toward goals  [X]     Improving slowly and progressing toward goals  [ ]     Not making progress toward goals and plan of care will be adjusted       PLAN:  Patient continues to benefit from skilled intervention to address the above impairments.  Continue  treatment per established plan of care.  Discharge Recommendations:  Skilled Nursing Facility  Further Equipment Recommendations for Discharge:  TBD       SUBJECTIVE:   Patient stated ???I can't hop I'm almost 72 years old.???      OBJECTIVE DATA SUMMARY:   Critical Behavior:              Functional Mobility Training:  Bed Mobility:     Supine to Sit: Minimal assistance     Scooting: CGA        Transfers:  Sit to Stand: Moderate assistance;Minimum assistance;Assist X2;Additional time;Verbal cues  Stand to Sit: Moderate assistance;Assist X2;Additional time;Verbal cues        Bed to Chair: Moderate assistance;Minimum assistance;Assist X2;Additional time;Verbal cues (completed x 2, poor adherance to NWB)                    Balance:  Sitting: Intact  Standing: Impaired  Standing - Static: Fair;Constant support  Standing - Dynamic : Poor  Ambulation/Gait Training:  Distance (ft): 2 Feet (ft)  Assistive Device: Gait belt;Walker, rolling  Ambulation - Level of Assistance: Moderate assistance (x 2)        Gait Abnormalities: Decreased step clearance (only able to pivot foot, no foot clearance on R)     Left Side Weight Bearing: As tolerated  Base of Support: Narrowed  Stance: Left decreased  Speed/Cadence: Slow  Step Length: Right shortened  Stairs:                       Neuro Re-Education:    Therapeutic Exercises:   Ankle pumps on R, LAQ, marching x 10 B  Pain:  Pain Scale 1: Numeric (0 - 10)  Pain Intensity 1: 0              Activity Tolerance:   Improving  Please refer to the flowsheet for vital signs taken during this treatment.  After treatment:   [X]     Patient left in no apparent distress sitting up in chair  [ ]     Patient left in no apparent distress in bed  [X]     Call bell left within reach  [X]     Nursing notified  [ ]     Caregiver present  [ ]     Bed alarm activated      COMMUNICATION/COLLABORATION:   The patient???s plan of care was discussed with: Registered Nurse    Rennis Harding,  PT   Time Calculation: 11 mins

## 2013-10-20 NOTE — Progress Notes (Addendum)
Problem: Mobility Impaired (Adult and Pediatric)  Goal: *Acute Goals and Plan of Care (Insert Text)  Physical Therapy Goals  Initiated 10/20/2013  1. Patient will move from supine to sit and sit to supine in bed with modified independence within 7 day(s).   2. Patient will transfer from bed to chair and chair to bed with supervision/set-up using the least restrictive device within 7 day(s).  3. Patient will perform sit to stand with supervision/set-up within 7 day(s).  4. Patient will ambulate with supervision/set-up for 20 feet with the least restrictive device within 7 day(s).   5. Patient will ascend/descend 7 stairs with 2 handrail(s) with minimal assistance/contact guard assist within 7 day(s).   PHYSICAL THERAPY EVALUATION INCLUDING A FUNCTIONAL MEASURE  Patient: Jill Prince (72 y.o. female)  Date: 10/20/2013  Primary Diagnosis: ADULT AQUIRED FLAT FOOT, POSTERIOR TIBIAL TENDON DYSFUNCTION  Arthritis of ankle or foot, left, degenerative  Arthritis of foot, degenerative, left  Procedure(s) (LRB):  LEFT DOUBLE ANKLE ARTHRODESIS  (GEN W/POP BLK) (Left) 1 Day Post-Op   Precautions:   NWB;Fall      ASSESSMENT :  Based on the objective data described below, the patient presents with decreased strength, balance, activity tolerance, and overall functional mobility.  Patient reports she lives alone and has no family assistance at discharge.  She also have 7 STE her home.  Currently she requires min A for supine to sit and min/mod A x 2 for sit to stand.  Patient is NWB on the L, with initial sit to stand had difficulty maintaining precaution although once standing is able to maintain NWB.  She performed stand pivot transfer to recliner with RW and mod A x 2.  Patient with R knee buckling and is only able to pivot foot, no foot clearance/hopping noted for transfer.  Patient will benefit from rehab placement at discharge.    Patient will benefit from skilled intervention to address the above impairments.  Patient???s  rehabilitation potential is considered to be Good  Factors which may influence rehabilitation potential include:   [ ]          None noted  [ ]          Mental ability/status  [X]          Medical condition  [X]          Home/family situation and support systems  [X]          Safety awareness  [ ]          Pain tolerance/management  [ ]          Other:        PLAN :  Recommendations and Planned Interventions:  [X]            Bed Mobility Training             [X]     Neuromuscular Re-Education  [X]            Transfer Training                   [ ]     Orthotic/Prosthetic Training  [X]            Gait Training                         [ ]     Modalities  [X]            Therapeutic Exercises           [ ]   Edema Management/Control  [X]            Therapeutic Activities            [X]     Patient and Family Training/Education  [ ]            Other (comment):    Frequency/Duration: Patient will be followed by physical therapy 1-2 x daily to address goals.  Discharge Recommendations: Rehab  Further Equipment Recommendations for Discharge: Owns RW and [facility]       SUBJECTIVE:   Patient stated ???I'm going to the [facility] when I leave here, I can't go home.???      OBJECTIVE DATA SUMMARY:       Past Medical History   Diagnosis Date   ??? Hypertension     ??? Chronic pain         Peripheral Neuropathy   ??? GERD (gastroesophageal reflux disease)     ??? Thyroid disease     ??? Nausea & vomiting     ??? Depression       Past Surgical History   Procedure Laterality Date   ??? Hx cholecystectomy   1975       Open   ??? Pr breast surgery procedure unlisted Left 1976       Lumpectomy   ??? Hx hysterectomy   1976   ??? Hx oophorectomy Left 1977   ??? Hx orthopaedic Bilateral 1990's       Bunionectomy   ??? Hx back surgery   2013 & 2014       Total of 4 lumbar fusion surgeries   ??? Hx knee arthroscopy Bilateral       Prior Level of Function/Home Situation: independent  Home Situation  Home Environment: Private residence  # Steps to Enter: 7  Rails to Enter: Yes  Hand Rails :  Bilateral  One/Two Story Residence: One story  Living Alone: Yes   Support Systems: Family member(s);Child(ren)  Patient Expects to be Discharged to:: Rehabilitation facility  Current DME Used/Available at Home: Cane, straight;Walker, rolling  Critical Behavior:              Skin:    Strength:    Strength: Generally decreased, functional                    Tone & Sensation:                                  Range Of Motion:  AROM: Generally decreased, functional           PROM: Generally decreased, functional           Coordination:       Functional Mobility:  Bed Mobility:     Supine to Sit: Minimal assistance     Scooting: CGA  Transfers:  Sit to Stand: Moderate assistance;Minimum assistance;Assist X2;Additional time;Verbal cues  Stand to Sit: Moderate assistance;Assist X2;Additional time;Verbal cues        Bed to Chair: Moderate assistance;Assist X2;Additional time;Verbal cues              Balance:   Sitting: Intact  Standing: Impaired  Standing - Static: Fair;Constant support  Standing - Dynamic : Poor  Ambulation/Gait Training:  Distance (ft): 1 Feet (ft)  Assistive Device: Gait belt;Walker, rolling  Ambulation - Level of Assistance: Moderate assistance (x 2)        Gait Abnormalities: Decreased step clearance (only  able to pivot foot, no foot clearance on R)     Left Side Weight Bearing: Non-weight bearing  Base of Support: Narrowed  Stance: Left decreased  Speed/Cadence: Slow  Step Length: Right shortened                                 Functional Measure:  Barthel Index:      Bathing: 0  Bladder: 10  Bowels: 10  Grooming: 5  Dressing: 5  Feeding: 10  Grooming: 5  Mobility: 0  Stairs: 0  Toilet Use: 5  Transfer (Bed to Chair and Back): 5  Total: 50        Barthel and G-code impairment scale:  Percentage of impairment CH  0% CI  1-19% CJ  20-39% CK  40-59% CL  60-79% CM  80-99% CN  100%   Barthel Score 0-100 100 99-80 79-60 59-40 20-39 1-19    0   Barthel Score 0-20 20 17-19 13-16 9-12 5-8 1-4 0      The  Barthel ADL Index: Guidelines  1. The index should be used as a record of what a patient does, not as a record of what a patient could do.  2. The main aim is to establish degree of independence from any help, physical or verbal, however minor and for whatever reason.  3. The need for supervision renders the patient not independent.  4. A patient's performance should be established using the best available evidence. Asking the patient, friends/relatives and nurses are the usual sources, but direct observation and common sense are also important. However direct testing is not needed.  5. Usually the patient's performance over the preceding 24-48 hours is important, but occasionally longer periods will be relevant.  6. Middle categories imply that the patient supplies over 50 per cent of the effort.  7. Use of aids to be independent is allowed.    Clarisa Kindred., Barthel, D.W. [phone]). Functional evaluation: the Barthel Index. Md State Med J (14)2.  Zenaida Niece der [place], J.J.M.F, [place], Ian Malkin., Margret Chance., [place], [place]. (1999). Measuring the change indisability after inpatient rehabilitation; comparison of the responsiveness of the Barthel Index and Functional Independence Measure. Journal of Neurology, Neurosurgery, and Psychiatry, 66(4), [phone].  Dawson Bills, N.J.A, [place],  W.J.M, & Koop[place]chap, M.A. (2004.) Assessment of post-stroke quality of life in cost-effectiveness studies: The usefulness of the Barthel Index and the EuroQoL-5D. Quality of Life Research, 13, [phone]             Therapeutic Exercises:     Pain:  Pain Scale 1: Numeric (0 - 10)  Pain Intensity 1: 0              Activity Tolerance:   Limited by fatigue  Please refer to the flowsheet for vital signs taken during this treatment.  After treatment:   [X]          Patient left in no apparent distress sitting up in chair  [ ]          Patient left in no apparent distress in bed  [X]          Call bell left within reach  [X]          Nursing notified  [  ]         Caregiver present  [ ]          Bed alarm activated      COMMUNICATION/EDUCATION:  The patient???s plan of care was discussed with: Registered Nurse.  [X]          Fall prevention education was provided and the patient/caregiver indicated understanding.  [X]          Patient/family have participated as able in goal setting and plan of care.  [X]          Patient/family agree to work toward stated goals and plan of care.  [ ]          Patient understands intent and goals of therapy, but is neutral about his/her participation.  [ ]          Patient is unable to participate in goal setting and plan of care.    Thank you for this referral.  Rennis Harding, PT   Time Calculation: 17 mins

## 2013-10-20 NOTE — Other (Signed)
ORTHO/MEDICAL INTERDISCIPLINARY ROUNDS    Patient Information    Name: Jill Prince  Age: 72 y.o.  Admission Date: 10/19/2013  Surgery/Procedure: Procedure(s):  LEFT DOUBLE ANKLE ARTHRODESIS  (GEN W/POP BLK)  Attending Provider: Brien Few, MD  Surgeon: @ORSURGROLE @   Problem List: Active Problems:    Arthritis of ankle or foot, left, 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/2013)      Arthritis of foot, 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/2013)      GERD (gastroesophageal reflux disease) (10/19/2013)      HTN (hypertension), benign (10/19/2013)      Depression (10/19/2013)      Neuropathy (10/19/2013)      Hypothyroidism (10/19/2013)      During rounds the following quality care indicators and evidence based practices were addressed :       PT/OT: Patient mobility - Min to mod of two to stand. NWB on Left. Stand-pivot to transfer to chair. Needs SNF.   Bed Mobility Training  Supine to Sit: Minimal assistance  Scooting: CGA  Transfer Training  Sit to Stand: Moderate assistance;Minimum assistance;Assist X2;Additional time;Verbal cues  Stand to Sit: Moderate assistance;Assist X2;Additional time;Verbal cues  Bed to Chair: Moderate assistance;Assist X2;Additional time;Verbal cues      Gait Training  Assistive Device: Gait belt;Walker, rolling  Ambulation - Level of Assistance: Moderate assistance (x 2)  Distance (ft): 1 Feet (ft)   Weight Bearing Status  Left Side Weight Bearing: Non-weight bearing      Pain Assessment  Pain Intensity 1: 0 (10/20/13 1124)        Patient Stated Pain Goal: 4    Pain meds: percocet, hydrocodone  Anticoagulation:  none  Foley: N   Goals For Today: Progress with PT, pain control    RRAT Score:      Readmit Risk Tool  Living Alone: Yes     Discharge Management/Planning:    Readmit Risk Assessment Information:      Readmit Risk Tool  Living Alone: Yes     Rehab/SNF Facility: consult pending. [facility] is the preference.  Anticipated Date of Discharge: 11/15    Interdisciplinary team rounds were held with the  following team members: Nurse Practitioner, Case Management, Nursing, Pharmacy, and Rehab. See clinical pathway and/or care plan for interventions and desired outcomes.

## 2013-10-20 NOTE — Progress Notes (Signed)
Wasted 28cc of Morphine PCA , witnessed by Sonda Primes, RN

## 2013-10-21 LAB — METABOLIC PANEL, BASIC
Anion gap: 4 mmol/L — ABNORMAL LOW (ref 5–15)
BUN/Creatinine ratio: 16 (ref 12–20)
BUN: 10 MG/DL (ref 6–20)
CO2: 34 mmol/L — ABNORMAL HIGH (ref 21–32)
Calcium: 9.2 MG/DL (ref 8.5–10.1)
Chloride: 100 mmol/L (ref 97–108)
Creatinine: 0.63 MG/DL (ref 0.45–1.15)
GFR est AA: >60 mL/min/{1.73_m2} (ref >60)
GFR est non-AA: >60 mL/min/{1.73_m2} (ref >60)
Glucose: 144 mg/dL — ABNORMAL HIGH (ref 65–100)
Potassium: 3.9 mmol/L (ref 3.5–5.1)
Sodium: 138 mmol/L (ref 136–145)

## 2013-10-21 LAB — IRON PROFILE
Iron % saturation: 12 % — ABNORMAL LOW (ref 20–50)
Iron: 34 ug/dL — ABNORMAL LOW (ref 35–150)
TIBC: 283 ug/dL (ref 250–450)

## 2013-10-21 LAB — CBC W/O DIFF
HCT: 34.9 % — ABNORMAL LOW (ref 35.0–47.0)
HGB: 11.1 g/dL — ABNORMAL LOW (ref 11.5–16.0)
MCH: 30.2 PG (ref 26.0–34.0)
MCHC: 31.8 g/dL (ref 30.0–36.5)
MCV: 94.8 FL (ref 80.0–99.0)
PLATELET: 204 10*3/uL (ref 150–400)
RBC: 3.68 M/uL — ABNORMAL LOW (ref 3.80–5.20)
RDW: 15.1 % — ABNORMAL HIGH (ref 11.5–14.5)
WBC: 8.6 10*3/uL (ref 3.6–11.0)

## 2013-10-21 LAB — MAGNESIUM: Magnesium: 1.8 mg/dL (ref 1.6–2.4)

## 2013-10-21 LAB — RETICULOCYTE COUNT: Reticulocyte count: 3.2 % — ABNORMAL HIGH (ref 0.7–2.1)

## 2013-10-21 LAB — FOLATE: Folate: 19.8 ng/mL (ref 5.0–21.0)

## 2013-10-21 LAB — VITAMIN B12: Vitamin B12: 619 pg/mL (ref 211–911)

## 2013-10-21 MED ORDER — OXYCODONE-ACETAMINOPHEN 5 MG-325 MG TAB
5-325 mg | ORAL_TABLET | ORAL | Status: AC | PRN
Start: 2013-10-21 — End: ?

## 2013-10-21 MED ORDER — CLONAZEPAM 0.5 MG TAB
0.5 mg | ORAL_TABLET | Freq: Every evening | ORAL | Status: AC | PRN
Start: 2013-10-21 — End: ?

## 2013-10-21 MED ORDER — ALPRAZOLAM 0.25 MG TAB
0.25 mg | ORAL_TABLET | Freq: Two times a day (BID) | ORAL | Status: AC | PRN
Start: 2013-10-21 — End: ?

## 2013-10-21 MED ADMIN — pantoprazole (PROTONIX) tablet 40 mg: ORAL | @ 02:00:00 | NDC 62175018146

## 2013-10-21 NOTE — Progress Notes (Signed)
Bedside and Verbal shift change report given to Tricia,RN (oncoming nurse) by Donna,RN (offgoing nurse). Report included the following information SBAR, Kardex and Recent Results.

## 2013-10-21 NOTE — Progress Notes (Signed)
Afebrile vital signs stable  Pain minimal  Plan:  Dc to SNF when bed avalilable  Follow up in 1 wk to 1 and1/2 weeks  nwb on left foot.

## 2013-10-21 NOTE — Other (Signed)
ORTHO/MEDICAL INTERDISCIPLINARY ROUNDS    Patient Information    Name: Jill Prince  Age: 72 y.o.  Admission Date: 10/19/2013  Surgery/Procedure: Procedure(s):  LEFT DOUBLE ANKLE ARTHRODESIS  (GEN W/POP BLK)  Attending Provider: Brien Few, MD  Surgeon: @ORSURGROLE @   Problem List: Principal Problem:    Arthritis of foot, 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/2013)    Active Problems:    Arthritis of ankle or foot, left, 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/2013)      GERD (gastroesophageal reflux disease) (10/19/2013)      HTN (hypertension), benign (10/19/2013)      Depression (10/19/2013)      Neuropathy (10/19/2013)      Hypothyroidism (10/19/2013)      Anemia (10/20/2013)      During rounds the following quality care indicators and evidence based practices were addressed :       PT/OT: Patient mobility - 48ft mod assist x1  Need reinforce weight bearing precautions  Bed Mobility Training  Supine to Sit: Minimal assistance  Scooting: CGA  Transfer Training  Sit to Stand: Moderate assistance;Minimum assistance;Assist X2;Additional time;Verbal cues  Stand to Sit: Moderate assistance;Assist X2;Additional time;Verbal cues  Bed to Chair: Moderate assistance;Minimum assistance;Assist X2;Additional time;Verbal cues (completed x 2, poor adherance to NWB)      Gait Training  Assistive Device: Gait belt;Walker, rolling  Ambulation - Level of Assistance: Moderate assistance (x 2)  Distance (ft): 2 Feet (ft)   Weight Bearing Status  Left Side Weight Bearing: As tolerated      Pain Assessment  Pain Intensity 1: 3 (10/21/13 1052)  Pain Location 1: Foot  Pain Intervention(s) 1: Medication (see MAR)  Patient Stated Pain Goal: 3    Pain meds: percocet  Antibiotics completed:  Anticoagulation: none needed   Foley: N   Goals For Today: Progress with PT, pain control    RRAT Score:   Prior Hospital Admissions  Patient Been Admitted to the Hospital Prior to this Encounter: Yes (greater than 30 days)  Name of the Hospital(s): [facility]  Why Were You in the  Hospital?: back surgery  Specialist Care: Yes (comment)  Readmit Risk Tool  Scheduled Admission: Yes  Living Alone: Yes   Caregivers/Community Support: Yes  History of Mental Illness: Yes (depression)  Polypharmacy (Greater Than 7 Home Meds): Yes  Currently Prescribed : None  Requires Financial, Physical and/or Educational Assistance With Medications: No  Independent with ADLs: Yes  Needs Assistance with Wound Care AND/OR Mgnt of O2, Nebulizer: No  Clinically Complex: No  History of Falls Within Past 3 Months: No  Chronic Medical Condition: No  Patient Directs Own Care: Yes  Readmit RISK Tool Score: 5    Discharge Management/Planning:    Readmit Risk Assessment Information:   Prior Hospital Admissions  Patient Been Admitted to the Hospital Prior to this Encounter: Yes (greater than 30 days)  Name of the Hospital(s): [facility]  Why Were You in the Hospital?: back surgery  Specialist Care: Yes (comment)  Readmit Risk Tool  Scheduled Admission: Yes  Living Alone: Yes   Caregivers/Community Support: Yes  History of Mental Illness: Yes (depression)  Polypharmacy (Greater Than 7 Home Meds): Yes  Currently Prescribed : None  Requires Financial, Physical and/or Educational Assistance With Medications: No  Independent with ADLs: Yes  Needs Assistance with Wound Care AND/OR Mgnt of O2, Nebulizer: No  Clinically Complex: No  History of Falls Within Past 3 Months: No  Chronic Medical Condition: No  Patient Directs Own Care: Yes  Readmit RISK Tool Score: 5  Rehab/SNF Facility: [facility]  Anticipated Date of Discharge: 11/15    Interdisciplinary team rounds were held with the following team members: Nurse Practitioner, Case Management, Nursing, Pharmacy, and Rehab. See clinical pathway and/or care plan for interventions and desired outcomes.

## 2013-10-21 NOTE — Progress Notes (Addendum)
10/21/2013 1637 Plan is for discharge tomorrow. Spoke with Aundra Millet in admissions at the [facility].   Pt will go to the [facility] room 603. Nursing can call report to [phone]. WC Transport arranged for 1:00pm, $78.00. Transport through [employer] [phone]. Pt made aware. AVS, scripts, MARS can be faxed to [phone] and then given to pt in envelope on chart. Pt goes to [facility].   Hazeline Junker, BSW     10/20/2013 1435 Accepted for rehab with the [facility].   Hazeline Junker, BSW     10/20/13 1219 Orders for SNF vs. Rehab received. Met with the pt. Plan is for rehab. After rehab, pt will be at her son Randy's home until she returns to her residence in [place]. Randy's home is 2 stories with 4 steps to enter.  There are no bed rooms on the 1st floor. There are 15 steps to reach the second floor. Pt has been using a cane for DME. Pt has had home health in the past but does not recall the agency. Pt agreeable to rehab placement. Choice letter given. Has signed letter and chosen [facility]. Referral has been sent to the [facility] via [facility].   Hazeline Junker, BSW

## 2013-10-21 NOTE — Progress Notes (Signed)
Problem: Mobility Impaired (Adult and Pediatric)  Goal: *Acute Goals and Plan of Care (Insert Text)  Physical Therapy Goals  Initiated 10/20/2013  1. Patient will move from supine to sit and sit to supine in bed with modified independence within 7 day(s).   2. Patient will transfer from bed to chair and chair to bed with supervision/set-up using the least restrictive device within 7 day(s).  3. Patient will perform sit to stand with supervision/set-up within 7 day(s).  4. Patient will ambulate with supervision/set-up for 20 feet with the least restrictive device within 7 day(s).   5. Patient will ascend/descend 7 stairs with 2 handrail(s) with minimal assistance/contact guard assist within 7 day(s).   PHYSICAL THERAPY TREATMENT  Patient: Jill Prince (72 y.o. female)  Date: 10/21/2013  Diagnosis: ADULT AQUIRED FLAT FOOT, POSTERIOR TIBIAL TENDON DYSFUNCTION  Arthritis of ankle or foot, left, degenerative  Arthritis of foot, degenerative, left Arthritis of foot, degenerative  Procedure(s) (LRB):  LEFT DOUBLE ANKLE ARTHRODESIS  (GEN W/POP BLK) (Left) 2 Days Post-Op  Precautions: NWB;Fall      ASSESSMENT:  Pt still unable to maintain NWB with upright activity due to poor strength and decreased sequencing.  She ambulated 4 feet forward and backward with MOD A x1;  She should not ambulate long distances due to her inability to maintain weight bearing.  We performed sit-stand practice and chair push-ups to improved sequencing and coordination of hopping sequence. LE exercises performed  Progression toward goals:  [ ]     Improving appropriately and progressing toward goals  [X]     Improving slowly and progressing toward goals  [ ]     Not making progress toward goals and plan of care will be adjusted       PLAN:  Patient continues to benefit from skilled intervention to address the above impairments.  Continue treatment per established plan of care.  Discharge Recommendations:  Skilled Nursing Facility  Further Equipment  Recommendations for Discharge:  rolling walker       SUBJECTIVE:   Patient stated ???its to hard to hold up.???      OBJECTIVE DATA SUMMARY:   Critical Behavior:              Functional Mobility Training:  Bed Mobility:                    Transfers:  Sit to Stand: Minimum assistance  Stand to Sit: Minimum assistance        Bed to Chair: Minimum assistance         repeated 5x with focus on Left LE in knee extension to prohibit increased weight bearing  Improve compliance with this instruction           Balance:     Ambulation/Gait Training:  Distance (ft): 4 Feet (ft)  Assistive Device: Walker, rolling;Gait belt  Ambulation - Level of Assistance: Moderate assistance (x1)        Gait Abnormalities: Decreased step clearance;Step to gait        Base of Support: Narrowed  Stance: Left decreased  Speed/Cadence: Slow                                  Stairs:                       Neuro Re-Education:    Therapeutic Exercises:   Seated: 2 sets of 10  Ankle pumps, LAQ, hip flexion, hip ABD  Seated: chair push ups  1 set of 10  Pain:  Pain Scale 1: Numeric (0 - 10)  Pain Intensity 1: 3  Pain Location 1: Foot  Pain Orientation 1: Left  Pain Description 1: Aching  Pain Intervention(s) 1: Medication (see MAR)  Activity Tolerance:   Fair- poor adherance to NWB  Please refer to the flowsheet for vital signs taken during this treatment.  After treatment:   [X]     Patient left in no apparent distress sitting up in chair  [ ]     Patient left in no apparent distress in bed  [X]     Call bell left within reach  [X]     Nursing notified  [ ]     Caregiver present  [ ]     Bed alarm activated      COMMUNICATION/COLLABORATION:   The patient???s plan of care was discussed with: Registered Nurse    Marianne Sofia, PT, DPT   Time Calculation: 20 mins

## 2013-10-21 NOTE — Progress Notes (Signed)
[facility]  [address] [place], [place] [address]  [phone]      Medical Progress Note      NAME: Jill Prince   DOB:  1941-04-17  MRM:  [id number]    Date/Time: 10/21/2013  9:33 AM         Subjective:     Chief Complaint:  Pain: left ankle, mild to moderate, intermittent, sore, better with pain meds    ROS:  (bold if positive, if negative)                        Tolerating PT  Tolerating Diet          Objective:       Vitals:          Last 24hrs VS reviewed since prior progress note. Most recent are:    Visit Vitals   Item Reading   ??? BP 122/66   ??? Pulse 96   ??? Temp 97.8 ??F (36.6 ??C)   ??? Resp 16   ??? Ht 5\' 6"  (1.676 m)   ??? Wt 104.781 kg (231 lb)   ??? BMI 37.3 kg/m2   ??? SpO2 94%     SpO2 Readings from Last 6 Encounters:   10/21/13 94%   10/21/13 94%    O2 Flow Rate (L/min): 2 l/min     Intake/Output Summary (Last 24 hours) at 10/21/13 0933  Last data filed at 10/20/13 2133   Gross per 24 hour   Intake   1400 ml   Output    850 ml   Net    550 ml          Exam:     Physical Exam:    Gen:  Well-developed, obese, in no acute distress  HEENT:  Pink conjunctivae, PERRL, hearing intact to voice, moist mucous membranes  Neck:  Supple, without masses, thyroid non-tender  Resp:  No accessory muscle use, clear breath sounds without wheezes rales or rhonchi  Card:  No murmurs, normal S1, S2 without thrills, bruits or peripheral edema  Abd:  Soft, non-tender, non-distended, normoactive bowel sounds are present, no palpable organomegaly and no detectable hernias  Lymph:  No cervical or inguinal adenopathy  Musc:  No cyanosis or clubbing  Skin:  No rashes or ulcers, skin turgor is good, let ankle in splint/ACE  Neuro:  Cranial nerves are grossly intact, no focal motor weakness, follows commands appropriately  Psych:  Good insight, oriented to person, place and time, alert    Medications Reviewed: (see below)    Lab Data Reviewed: (see below)     ______________________________________________________________________    Medications:     Current Facility-Administered Medications   Medication Dose Route Frequency   ??? lisinopril (PRINIVIL, ZESTRIL) tablet 10 mg  10 mg Oral DAILY   ??? pregabalin (LYRICA) capsule 150 mg  150 mg Oral TID   ??? levothyroxine (SYNTHROID) tablet 100 mcg  100 mcg Oral ACB   ??? multivitamin with iron tablet 1 tablet  1 tablet Oral DAILY WITH BREAKFAST   ??? DULoxetine (CYMBALTA) capsule 60 mg  60 mg Oral BID   ??? senna-docusate (PERICOLACE) 8.6-50 mg per tablet 3 tablet  3 tablet Oral QHS   ??? clonazePAM (KlonoPIN) tablet 1 mg  1 mg Oral QHS   ??? HYDROcodone-acetaminophen (NORCO) 7.5-325 mg per tablet 1 tablet  1 tablet Oral Q6H PRN   ??? [EXPIRED] dextrose 5% lactated ringers infusion  125 mL/hr IntraVENous CONTINUOUS   ??? sodium chloride (NS) flush 5-10 mL  5-10 mL IntraVENous Q8H   ??? sodium chloride (NS) flush 5-10 mL  5-10 mL IntraVENous PRN   ??? pantoprazole (PROTONIX) tablet 40 mg  40 mg Oral QHS   ??? oxyCODONE-acetaminophen (PERCOCET) 5-325 mg per tablet 1 tablet  1 tablet Oral Q3H PRN   ??? oxyCODONE-acetaminophen (PERCOCET) 5-325 mg per tablet 2 tablet  2 tablet Oral Q3H PRN            Lab Review:     Recent Labs      10/21/13   0255  10/20/13   0245   WBC  8.6  10.0   HGB  11.1*  10.4*   HCT  34.9*  33.0*   PLT  204  209     Recent Labs      10/21/13   0255  10/20/13   0245   NA  138  138   K  3.9  4.1   CL  100  99   CO2  34*  31   GLU  144*  157*   BUN  10  12   CREA  0.63  0.69   CA  9.2  9.0   MG  1.8   --    ALB   --   2.8*   TBILI   --   0.2   SGOT   --   18   ALT   --   32     No results found for this basename: glpoc, GLUCPOC     No results found for this basename: PH, PCO2, PO2, HCO3, FIO2,  in the last 72 hours  No results found for this basename: INR,  in the last 72 hours  No results found for this basename: SDES     No results found for this basename: CULT            Assessment:     Principal Problem:    Arthritis of foot,  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/2013)    Active Problems:    Arthritis of ankle or foot, left, 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/2013)      GERD (gastroesophageal reflux disease) (10/19/2013)      HTN (hypertension), benign (10/19/2013)      Depression (10/19/2013)      Neuropathy (10/19/2013)      Hypothyroidism (10/19/2013)      Anemia (10/20/2013)           Plan:     Active Problems:    HTN (hypertension), benign (10/19/2013)   - BP okay on meds as above      GERD (gastroesophageal reflux disease) (10/19/2013)   - continue PPI      Depression (10/19/2013)   - continue psych meds      Neuropathy (10/19/2013)   - stable, pain controlled      Hypothyroidism (10/19/2013)   - continue LT4      Anemia (10/20/2013)   - Hgb stable      Arthritis of ankle or foot, left, 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/2013)/Arthritis of foot, 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/2013)   - management per Ortho   - no medical barrier to discharge to SNF for rehab      Total time spent with patient: 35 minutes                  Care Plan discussed with: Patient and Nursing Staff    Discussed:  Care Plan and D/C Planning  Prophylaxis:  SCD's    Disposition:  SNF/LTC           ___________________________________________________    Attending Physician: Marijo File, MD

## 2013-10-22 DIAGNOSIS — M1990 Unspecified osteoarthritis, unspecified site: Secondary | ICD-10-CM | POA: Diagnosis not present

## 2013-10-22 DIAGNOSIS — E039 Hypothyroidism, unspecified: Secondary | ICD-10-CM | POA: Diagnosis not present

## 2013-10-22 DIAGNOSIS — G589 Mononeuropathy, unspecified: Secondary | ICD-10-CM | POA: Diagnosis not present

## 2013-10-22 DIAGNOSIS — I10 Essential (primary) hypertension: Secondary | ICD-10-CM | POA: Diagnosis not present

## 2013-10-22 DIAGNOSIS — M2140 Flat foot [pes planus] (acquired), unspecified foot: Secondary | ICD-10-CM | POA: Diagnosis not present

## 2013-10-22 DIAGNOSIS — G8929 Other chronic pain: Secondary | ICD-10-CM | POA: Diagnosis not present

## 2013-10-22 DIAGNOSIS — M19079 Primary osteoarthritis, unspecified ankle and foot: Secondary | ICD-10-CM | POA: Diagnosis not present

## 2013-10-22 DIAGNOSIS — F332 Major depressive disorder, recurrent severe without psychotic features: Secondary | ICD-10-CM | POA: Diagnosis not present

## 2013-10-22 DIAGNOSIS — M6281 Muscle weakness (generalized): Secondary | ICD-10-CM | POA: Diagnosis not present

## 2013-10-22 DIAGNOSIS — R52 Pain, unspecified: Secondary | ICD-10-CM | POA: Diagnosis not present

## 2013-10-22 DIAGNOSIS — M2040 Other hammer toe(s) (acquired), unspecified foot: Secondary | ICD-10-CM | POA: Diagnosis not present

## 2013-10-22 DIAGNOSIS — K219 Gastro-esophageal reflux disease without esophagitis: Secondary | ICD-10-CM | POA: Diagnosis not present

## 2013-10-22 DIAGNOSIS — G5770 Causalgia of unspecified lower limb: Secondary | ICD-10-CM | POA: Diagnosis not present

## 2013-10-22 DIAGNOSIS — F329 Major depressive disorder, single episode, unspecified: Secondary | ICD-10-CM | POA: Diagnosis not present

## 2013-10-22 DIAGNOSIS — B351 Tinea unguium: Secondary | ICD-10-CM | POA: Diagnosis not present

## 2013-10-22 DIAGNOSIS — R5381 Other malaise: Secondary | ICD-10-CM | POA: Diagnosis not present

## 2013-10-22 MED ADMIN — pantoprazole (PROTONIX) tablet 40 mg: ORAL | @ 02:00:00 | NDC 62175018146

## 2013-10-22 NOTE — Progress Notes (Signed)
[facility]  [address] [place], [place] [address]  [phone]      Medical Progress Note      NAME: Jill Prince   DOB:  Apr 26, 1941  MRM:  [id number]    Date/Time: 10/22/2013  8:47 AM          Subjective:     Chief Complaint:  Pain: left ankle, mild to moderate, intermittent, sore, better with pain meds    ROS:  (bold if positive, if negative)                        Tolerating PT  Tolerating Diet          Objective:       Vitals:          Last 24hrs VS reviewed since prior progress note. Most recent are:    Visit Vitals   Item Reading   ??? BP 121/73   ??? Pulse 95   ??? Temp 98.5 ??F (36.9 ??C)   ??? Resp 17   ??? Ht 5\' 6"  (1.676 m)   ??? Wt 104.781 kg (231 lb)   ??? BMI 37.3 kg/m2   ??? SpO2 90%     SpO2 Readings from Last 6 Encounters:   10/22/13 90%   10/22/13 90%    O2 Flow Rate (L/min): 2 l/min       Intake/Output Summary (Last 24 hours) at 10/22/13 0847  Last data filed at 10/22/13 0558   Gross per 24 hour   Intake      0 ml   Output    200 ml   Net   -200 ml          Exam:     Physical Exam:    Gen:  Well-developed, obese, in no acute distress  HEENT:  Pink conjunctivae, PERRL, hearing intact to voice, moist mucous membranes  Neck:  Supple, without masses, thyroid non-tender  Resp:  No accessory muscle use, clear breath sounds without wheezes rales or rhonchi  Card:  No murmurs, normal S1, S2 without thrills, bruits or peripheral edema  Abd:  Soft, non-tender, non-distended, normoactive bowel sounds are present, no palpable organomegaly and no detectable hernias  Lymph:  No cervical or inguinal adenopathy  Musc:  No cyanosis or clubbing  Skin:  No rashes or ulcers, skin turgor is good, let ankle in splint/ACE  Neuro:  Cranial nerves are grossly intact, no focal motor weakness, follows commands appropriately  Psych:  Good insight, oriented to person, place and time, alert    Medications Reviewed: (see below)    Lab Data Reviewed: (see below)     ______________________________________________________________________    Medications:     Current Facility-Administered Medications   Medication Dose Route Frequency   ??? lisinopril (PRINIVIL, ZESTRIL) tablet 10 mg  10 mg Oral DAILY   ??? pregabalin (LYRICA) capsule 150 mg  150 mg Oral TID   ??? levothyroxine (SYNTHROID) tablet 100 mcg  100 mcg Oral ACB   ??? multivitamin with iron tablet 1 tablet  1 tablet Oral DAILY WITH BREAKFAST   ??? DULoxetine (CYMBALTA) capsule 60 mg  60 mg Oral BID   ??? senna-docusate (PERICOLACE) 8.6-50 mg per tablet 3 tablet  3 tablet Oral QHS   ??? clonazePAM (KlonoPIN) tablet 1 mg  1 mg Oral QHS   ??? HYDROcodone-acetaminophen (NORCO) 7.5-325 mg per tablet 1 tablet  1 tablet Oral Q6H PRN   ??? sodium  chloride (NS) flush 5-10 mL  5-10 mL IntraVENous Q8H   ??? sodium chloride (NS) flush 5-10 mL  5-10 mL IntraVENous PRN   ??? pantoprazole (PROTONIX) tablet 40 mg  40 mg Oral QHS   ??? oxyCODONE-acetaminophen (PERCOCET) 5-325 mg per tablet 1 tablet  1 tablet Oral Q3H PRN   ??? oxyCODONE-acetaminophen (PERCOCET) 5-325 mg per tablet 2 tablet  2 tablet Oral Q3H PRN            Lab Review:     Recent Labs      10/21/13   0255  10/20/13   0245   WBC  8.6  10.0   HGB  11.1*  10.4*   HCT  34.9*  33.0*   PLT  204  209     Recent Labs      10/21/13   0255  10/20/13   0245   NA  138  138   K  3.9  4.1   CL  100  99   CO2  34*  31   GLU  144*  157*   BUN  10  12   CREA  0.63  0.69   CA  9.2  9.0   MG  1.8   --    ALB   --   2.8*   TBILI   --   0.2   SGOT   --   18   ALT   --   32     No components found with this basename: glpoc,  GLUCPOC     No results found for this basename: PH, PCO2, PO2, HCO3, FIO2,  in the last 72 hours  No results found for this basename: INR,  in the last 72 hours  No results found for this basename: SDES     No results found for this basename: CULT            Assessment:     Principal Problem:    Arthritis of foot, 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/2013)    Active Problems:    Arthritis of ankle or foot, left,  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/2013)      GERD (gastroesophageal reflux disease) (10/19/2013)      HTN (hypertension), benign (10/19/2013)      Depression (10/19/2013)      Neuropathy (10/19/2013)      Hypothyroidism (10/19/2013)      Anemia (10/20/2013)           Plan:     Active Problems:    HTN (hypertension), benign (10/19/2013)   - BP okay on meds as above      GERD (gastroesophageal reflux disease) (10/19/2013)   - continue PPI      Depression (10/19/2013)   - continue psych meds      Neuropathy (10/19/2013)   - stable, pain controlled      Hypothyroidism (10/19/2013)   - continue LT4      Anemia (10/20/2013)   - Hgb stable      Arthritis of ankle or foot, left, 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/2013)/Arthritis of foot, 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/2013)   - management per Ortho   - no medical barrier to discharge to SNF for rehab, note plans for discharge today      Total time spent with patient: 25 minutes                  Care Plan discussed with: Patient and Nursing Staff    Discussed:  Care Plan and D/C Planning    Prophylaxis:  SCD's    Disposition:  SNF/LTC           ___________________________________________________    Attending Physician: Marijo File, MD

## 2013-10-22 NOTE — Progress Notes (Signed)
[facility] called and report given to [place]. [facility] is scheduled to pick patient up at 1300.

## 2013-10-22 NOTE — Progress Notes (Addendum)
1234 discharge instruction reviewed with patient, son and daughter in law present, pt verbalized understanding, opportunity for questions/feedback was given. Pt signed hard copy of AVS.    [facility], Greig Castilla arrived, pt left via wheelchair.

## 2013-10-25 DIAGNOSIS — R5381 Other malaise: Secondary | ICD-10-CM | POA: Diagnosis not present

## 2013-10-25 DIAGNOSIS — R52 Pain, unspecified: Secondary | ICD-10-CM | POA: Diagnosis not present

## 2013-11-01 DIAGNOSIS — M19079 Primary osteoarthritis, unspecified ankle and foot: Secondary | ICD-10-CM | POA: Diagnosis not present

## 2013-11-29 DIAGNOSIS — M2140 Flat foot [pes planus] (acquired), unspecified foot: Secondary | ICD-10-CM | POA: Diagnosis not present

## 2013-11-29 DIAGNOSIS — M19079 Primary osteoarthritis, unspecified ankle and foot: Secondary | ICD-10-CM | POA: Diagnosis not present

## 2013-12-05 DIAGNOSIS — M2040 Other hammer toe(s) (acquired), unspecified foot: Secondary | ICD-10-CM | POA: Diagnosis not present

## 2013-12-05 DIAGNOSIS — G5770 Causalgia of unspecified lower limb: Secondary | ICD-10-CM | POA: Diagnosis not present

## 2013-12-05 DIAGNOSIS — B351 Tinea unguium: Secondary | ICD-10-CM | POA: Diagnosis not present

## 2013-12-16 NOTE — Discharge Summary (Signed)
Physician Discharge Summary     Patient ID:    Jill Prince  [id number]  72 y.o.  October 01, 1941    Admit date: 10/19/2013    Discharge date and time: 12/16/2013    Admission Diagnoses: ADULT AQUIRED FLAT FOOT, POSTERIOR TIBIAL TENDON DYSFUNCTION  Arthritis of ankle or foot, left, degenerative  Arthritis of foot, degenerative, left    Chronic Diagnoses:    Problem List as of 10/22/2013 Date Reviewed: 10/20/2013        ICD-9-CM Class Noted - Resolved    Anemia (Chronic) 285.9  10/20/2013 - Present        Arthritis of ankle or foot, left, degenerative (Chronic) 715.97  10/19/2013 - Present        *Arthritis of foot, degenerative (Chronic) 715.97  10/19/2013 - Present        GERD (gastroesophageal reflux disease) (Chronic) 530.81  10/19/2013 - Present        HTN (hypertension), benign (Chronic) 401.1  10/19/2013 - Present        Depression (Chronic) 311  10/19/2013 - Present        Neuropathy (Chronic) 355.9  10/19/2013 - Present        Hypothyroidism (Chronic) 244.9  10/19/2013 - Present        RESOLVED: Sinus tachycardia 427.89  10/20/2013 - 10/21/2013              Discharge Medications: Cannot display discharge medications since this patient is not currently admitted.       Follow up Care:    1. Phys Other, MD in 1-2 weeks  2. Corwin LevinsVan manen in 1 or 2 weeks    Diet:  Regular Diet    Disposition:  Home.    Advanced Directive:    Discharge Exam:  See today's note.    CONSULTATIONS: None    Significant Diagnostic Studies:   No results found for this basename: WBC, HGB, HCT, PLT,  in the last 72 hours  No results found for this basename: NA, K, CL, CO2, BUN, CREA, GLU, CA, MG, PHOS, URICA,  in the last 72 hours  No results found for this basename: SGOT, GPT, AP, TBIL, TP, ALB, GLOB, GGT, AML, AMYP, LPSE, HLPSE,  in the last 72 hours  No results found for this basename: INR, PTP, APTT,  in the last 72 hours   No results found for this basename: FE, TIBC, PSAT, FERR,  in the last 72 hours   No results found for this basename: PH,  PCO2, PO2,  in the last 72 hours  No results found for this basename: CPK, CKMB, TROPONINI,  in the last 72 hours  No components found with this basename: [facility]             HOSPITAL COURSE & TREATMENT RENDERED:   1. surgery      Signed:  Brien FewJohn  Maxx Calaway, MD  12/16/2013  12:46 PM

## 2013-12-20 DIAGNOSIS — M19079 Primary osteoarthritis, unspecified ankle and foot: Secondary | ICD-10-CM | POA: Diagnosis not present

## 2013-12-22 DIAGNOSIS — R5381 Other malaise: Secondary | ICD-10-CM | POA: Diagnosis not present

## 2014-01-10 DIAGNOSIS — I10 Essential (primary) hypertension: Secondary | ICD-10-CM | POA: Diagnosis not present

## 2014-01-10 DIAGNOSIS — G609 Hereditary and idiopathic neuropathy, unspecified: Secondary | ICD-10-CM | POA: Diagnosis not present

## 2014-01-10 DIAGNOSIS — Z4789 Encounter for other orthopedic aftercare: Secondary | ICD-10-CM | POA: Diagnosis not present

## 2014-01-10 DIAGNOSIS — R269 Unspecified abnormalities of gait and mobility: Secondary | ICD-10-CM | POA: Diagnosis not present

## 2014-01-10 DIAGNOSIS — M6281 Muscle weakness (generalized): Secondary | ICD-10-CM | POA: Diagnosis not present

## 2014-01-12 DIAGNOSIS — G609 Hereditary and idiopathic neuropathy, unspecified: Secondary | ICD-10-CM | POA: Diagnosis not present

## 2014-01-12 DIAGNOSIS — M6281 Muscle weakness (generalized): Secondary | ICD-10-CM | POA: Diagnosis not present

## 2014-01-12 DIAGNOSIS — I10 Essential (primary) hypertension: Secondary | ICD-10-CM | POA: Diagnosis not present

## 2014-01-12 DIAGNOSIS — Z4789 Encounter for other orthopedic aftercare: Secondary | ICD-10-CM | POA: Diagnosis not present

## 2014-01-12 DIAGNOSIS — R269 Unspecified abnormalities of gait and mobility: Secondary | ICD-10-CM | POA: Diagnosis not present

## 2014-01-17 DIAGNOSIS — M6281 Muscle weakness (generalized): Secondary | ICD-10-CM | POA: Diagnosis not present

## 2014-01-17 DIAGNOSIS — Z4789 Encounter for other orthopedic aftercare: Secondary | ICD-10-CM | POA: Diagnosis not present

## 2014-01-17 DIAGNOSIS — G609 Hereditary and idiopathic neuropathy, unspecified: Secondary | ICD-10-CM | POA: Diagnosis not present

## 2014-01-17 DIAGNOSIS — R269 Unspecified abnormalities of gait and mobility: Secondary | ICD-10-CM | POA: Diagnosis not present

## 2014-01-17 DIAGNOSIS — I10 Essential (primary) hypertension: Secondary | ICD-10-CM | POA: Diagnosis not present

## 2014-01-25 DIAGNOSIS — Z4789 Encounter for other orthopedic aftercare: Secondary | ICD-10-CM | POA: Diagnosis not present

## 2014-01-25 DIAGNOSIS — R269 Unspecified abnormalities of gait and mobility: Secondary | ICD-10-CM | POA: Diagnosis not present

## 2014-01-25 DIAGNOSIS — I10 Essential (primary) hypertension: Secondary | ICD-10-CM | POA: Diagnosis not present

## 2014-01-25 DIAGNOSIS — G609 Hereditary and idiopathic neuropathy, unspecified: Secondary | ICD-10-CM | POA: Diagnosis not present

## 2014-01-25 DIAGNOSIS — M6281 Muscle weakness (generalized): Secondary | ICD-10-CM | POA: Diagnosis not present

## 2014-01-26 DIAGNOSIS — M6281 Muscle weakness (generalized): Secondary | ICD-10-CM | POA: Diagnosis not present

## 2014-01-26 DIAGNOSIS — R269 Unspecified abnormalities of gait and mobility: Secondary | ICD-10-CM | POA: Diagnosis not present

## 2014-01-26 DIAGNOSIS — I10 Essential (primary) hypertension: Secondary | ICD-10-CM | POA: Diagnosis not present

## 2014-01-26 DIAGNOSIS — Z4789 Encounter for other orthopedic aftercare: Secondary | ICD-10-CM | POA: Diagnosis not present

## 2014-01-26 DIAGNOSIS — G609 Hereditary and idiopathic neuropathy, unspecified: Secondary | ICD-10-CM | POA: Diagnosis not present

## 2014-01-31 DIAGNOSIS — M2140 Flat foot [pes planus] (acquired), unspecified foot: Secondary | ICD-10-CM | POA: Diagnosis not present

## 2014-01-31 DIAGNOSIS — Z09 Encounter for follow-up examination after completed treatment for conditions other than malignant neoplasm: Secondary | ICD-10-CM | POA: Diagnosis not present

## 2014-02-03 DIAGNOSIS — G609 Hereditary and idiopathic neuropathy, unspecified: Secondary | ICD-10-CM | POA: Diagnosis not present

## 2014-02-03 DIAGNOSIS — I10 Essential (primary) hypertension: Secondary | ICD-10-CM | POA: Diagnosis not present

## 2014-02-03 DIAGNOSIS — Z4789 Encounter for other orthopedic aftercare: Secondary | ICD-10-CM | POA: Diagnosis not present

## 2014-02-03 DIAGNOSIS — M6281 Muscle weakness (generalized): Secondary | ICD-10-CM | POA: Diagnosis not present

## 2014-02-03 DIAGNOSIS — R269 Unspecified abnormalities of gait and mobility: Secondary | ICD-10-CM | POA: Diagnosis not present

## 2014-02-07 DIAGNOSIS — M2140 Flat foot [pes planus] (acquired), unspecified foot: Secondary | ICD-10-CM | POA: Diagnosis not present

## 2014-02-07 DIAGNOSIS — G609 Hereditary and idiopathic neuropathy, unspecified: Secondary | ICD-10-CM | POA: Diagnosis not present

## 2014-02-16 DIAGNOSIS — M21969 Unspecified acquired deformity of unspecified lower leg: Secondary | ICD-10-CM | POA: Diagnosis not present

## 2014-02-16 DIAGNOSIS — M79609 Pain in unspecified limb: Secondary | ICD-10-CM | POA: Diagnosis not present

## 2014-02-16 DIAGNOSIS — M76829 Posterior tibial tendinitis, unspecified leg: Secondary | ICD-10-CM | POA: Diagnosis not present

## 2014-02-16 DIAGNOSIS — IMO0002 Reserved for concepts with insufficient information to code with codable children: Secondary | ICD-10-CM | POA: Diagnosis not present

## 2014-02-21 DIAGNOSIS — G609 Hereditary and idiopathic neuropathy, unspecified: Secondary | ICD-10-CM | POA: Diagnosis not present

## 2014-02-21 DIAGNOSIS — M2140 Flat foot [pes planus] (acquired), unspecified foot: Secondary | ICD-10-CM | POA: Diagnosis not present

## 2014-02-28 DIAGNOSIS — IMO0001 Reserved for inherently not codable concepts without codable children: Secondary | ICD-10-CM | POA: Diagnosis not present

## 2014-02-28 DIAGNOSIS — I10 Essential (primary) hypertension: Secondary | ICD-10-CM | POA: Diagnosis not present

## 2014-02-28 DIAGNOSIS — E782 Mixed hyperlipidemia: Secondary | ICD-10-CM | POA: Diagnosis not present

## 2014-02-28 DIAGNOSIS — R5383 Other fatigue: Secondary | ICD-10-CM | POA: Diagnosis not present

## 2014-02-28 DIAGNOSIS — E669 Obesity, unspecified: Secondary | ICD-10-CM | POA: Diagnosis not present

## 2014-02-28 DIAGNOSIS — E039 Hypothyroidism, unspecified: Secondary | ICD-10-CM | POA: Diagnosis not present

## 2014-02-28 DIAGNOSIS — R5381 Other malaise: Secondary | ICD-10-CM | POA: Diagnosis not present

## 2014-02-28 DIAGNOSIS — M1990 Unspecified osteoarthritis, unspecified site: Secondary | ICD-10-CM | POA: Diagnosis not present

## 2014-03-06 DIAGNOSIS — I10 Essential (primary) hypertension: Secondary | ICD-10-CM | POA: Diagnosis not present

## 2014-03-06 DIAGNOSIS — K2100 Gastro-esophageal reflux disease with esophagitis, without bleeding: Secondary | ICD-10-CM | POA: Diagnosis not present

## 2014-03-06 DIAGNOSIS — M810 Age-related osteoporosis without current pathological fracture: Secondary | ICD-10-CM | POA: Diagnosis not present

## 2014-03-06 DIAGNOSIS — M545 Low back pain, unspecified: Secondary | ICD-10-CM | POA: Diagnosis not present

## 2014-03-06 DIAGNOSIS — E039 Hypothyroidism, unspecified: Secondary | ICD-10-CM | POA: Diagnosis not present

## 2014-03-06 DIAGNOSIS — E782 Mixed hyperlipidemia: Secondary | ICD-10-CM | POA: Diagnosis not present

## 2014-03-06 DIAGNOSIS — G9050 Complex regional pain syndrome I, unspecified: Secondary | ICD-10-CM | POA: Diagnosis not present

## 2014-03-06 DIAGNOSIS — IMO0002 Reserved for concepts with insufficient information to code with codable children: Secondary | ICD-10-CM | POA: Diagnosis not present

## 2014-04-18 DIAGNOSIS — T8489XA Other specified complication of internal orthopedic prosthetic devices, implants and grafts, initial encounter: Secondary | ICD-10-CM | POA: Diagnosis not present

## 2014-04-25 ENCOUNTER — Emergency Department (HOSPITAL_COMMUNITY): Payer: Medicare Other

## 2014-04-25 ENCOUNTER — Encounter (HOSPITAL_COMMUNITY): Payer: Self-pay | Admitting: Emergency Medicine

## 2014-04-25 ENCOUNTER — Inpatient Hospital Stay (HOSPITAL_COMMUNITY): Payer: Medicare Other

## 2014-04-25 ENCOUNTER — Inpatient Hospital Stay (HOSPITAL_COMMUNITY)
Admission: EM | Admit: 2014-04-25 | Discharge: 2014-05-02 | DRG: 493 | Disposition: A | Discharge status: Home Health | Payer: Medicare Other | Attending: Internal Medicine | Admitting: Internal Medicine

## 2014-04-25 DIAGNOSIS — Z87891 Personal history of nicotine dependence: Secondary | ICD-10-CM | POA: Diagnosis not present

## 2014-04-25 DIAGNOSIS — Z4789 Encounter for other orthopedic aftercare: Secondary | ICD-10-CM | POA: Diagnosis not present

## 2014-04-25 DIAGNOSIS — L02419 Cutaneous abscess of limb, unspecified: Secondary | ICD-10-CM | POA: Diagnosis not present

## 2014-04-25 DIAGNOSIS — L089 Local infection of the skin and subcutaneous tissue, unspecified: Secondary | ICD-10-CM | POA: Diagnosis not present

## 2014-04-25 DIAGNOSIS — M25473 Effusion, unspecified ankle: Secondary | ICD-10-CM | POA: Diagnosis not present

## 2014-04-25 DIAGNOSIS — K219 Gastro-esophageal reflux disease without esophagitis: Secondary | ICD-10-CM | POA: Diagnosis not present

## 2014-04-25 DIAGNOSIS — M8610 Other acute osteomyelitis, unspecified site: Secondary | ICD-10-CM

## 2014-04-25 DIAGNOSIS — E1169 Type 2 diabetes mellitus with other specified complication: Secondary | ICD-10-CM | POA: Diagnosis present

## 2014-04-25 DIAGNOSIS — R7309 Other abnormal glucose: Secondary | ICD-10-CM

## 2014-04-25 DIAGNOSIS — Y831 Surgical operation with implant of artificial internal device as the cause of abnormal reaction of the patient, or of later complication, without mention of misadventure at the time of the procedure: Secondary | ICD-10-CM | POA: Diagnosis present

## 2014-04-25 DIAGNOSIS — Z452 Encounter for adjustment and management of vascular access device: Secondary | ICD-10-CM | POA: Diagnosis not present

## 2014-04-25 DIAGNOSIS — L02619 Cutaneous abscess of unspecified foot: Secondary | ICD-10-CM | POA: Diagnosis not present

## 2014-04-25 DIAGNOSIS — S81009A Unspecified open wound, unspecified knee, initial encounter: Secondary | ICD-10-CM | POA: Diagnosis not present

## 2014-04-25 DIAGNOSIS — I10 Essential (primary) hypertension: Secondary | ICD-10-CM | POA: Diagnosis not present

## 2014-04-25 DIAGNOSIS — T148XXA Other injury of unspecified body region, initial encounter: Secondary | ICD-10-CM | POA: Diagnosis not present

## 2014-04-25 DIAGNOSIS — L03116 Cellulitis of left lower limb: Secondary | ICD-10-CM

## 2014-04-25 DIAGNOSIS — E039 Hypothyroidism, unspecified: Secondary | ICD-10-CM | POA: Diagnosis present

## 2014-04-25 DIAGNOSIS — E119 Type 2 diabetes mellitus without complications: Secondary | ICD-10-CM | POA: Diagnosis not present

## 2014-04-25 DIAGNOSIS — M9080 Osteopathy in diseases classified elsewhere, unspecified site: Secondary | ICD-10-CM | POA: Diagnosis present

## 2014-04-25 DIAGNOSIS — L02612 Cutaneous abscess of left foot: Secondary | ICD-10-CM

## 2014-04-25 DIAGNOSIS — G609 Hereditary and idiopathic neuropathy, unspecified: Secondary | ICD-10-CM | POA: Diagnosis not present

## 2014-04-25 DIAGNOSIS — T847XXA Infection and inflammatory reaction due to other internal orthopedic prosthetic devices, implants and grafts, initial encounter: Principal | ICD-10-CM

## 2014-04-25 DIAGNOSIS — E1165 Type 2 diabetes mellitus with hyperglycemia: Secondary | ICD-10-CM

## 2014-04-25 DIAGNOSIS — L03119 Cellulitis of unspecified part of limb: Secondary | ICD-10-CM | POA: Diagnosis not present

## 2014-04-25 DIAGNOSIS — M25579 Pain in unspecified ankle and joints of unspecified foot: Secondary | ICD-10-CM | POA: Diagnosis not present

## 2014-04-25 DIAGNOSIS — N390 Urinary tract infection, site not specified: Secondary | ICD-10-CM

## 2014-04-25 DIAGNOSIS — M009 Pyogenic arthritis, unspecified: Secondary | ICD-10-CM

## 2014-04-25 DIAGNOSIS — IMO0002 Reserved for concepts with insufficient information to code with codable children: Secondary | ICD-10-CM

## 2014-04-25 DIAGNOSIS — Y849 Medical procedure, unspecified as the cause of abnormal reaction of the patient, or of later complication, without mention of misadventure at the time of the procedure: Secondary | ICD-10-CM | POA: Diagnosis not present

## 2014-04-25 DIAGNOSIS — R059 Cough, unspecified: Secondary | ICD-10-CM | POA: Diagnosis not present

## 2014-04-25 DIAGNOSIS — IMO0001 Reserved for inherently not codable concepts without codable children: Secondary | ICD-10-CM

## 2014-04-25 DIAGNOSIS — S41009A Unspecified open wound of unspecified shoulder, initial encounter: Secondary | ICD-10-CM | POA: Diagnosis not present

## 2014-04-25 DIAGNOSIS — B961 Klebsiella pneumoniae [K. pneumoniae] as the cause of diseases classified elsewhere: Secondary | ICD-10-CM | POA: Diagnosis not present

## 2014-04-25 DIAGNOSIS — M25476 Effusion, unspecified foot: Secondary | ICD-10-CM | POA: Diagnosis present

## 2014-04-25 DIAGNOSIS — M869 Osteomyelitis, unspecified: Secondary | ICD-10-CM | POA: Diagnosis present

## 2014-04-25 DIAGNOSIS — T82897A Other specified complication of cardiac prosthetic devices, implants and grafts, initial encounter: Secondary | ICD-10-CM

## 2014-04-25 DIAGNOSIS — R739 Hyperglycemia, unspecified: Secondary | ICD-10-CM

## 2014-04-25 DIAGNOSIS — G589 Mononeuropathy, unspecified: Secondary | ICD-10-CM | POA: Diagnosis present

## 2014-04-25 DIAGNOSIS — R269 Unspecified abnormalities of gait and mobility: Secondary | ICD-10-CM | POA: Diagnosis not present

## 2014-04-25 DIAGNOSIS — R5381 Other malaise: Secondary | ICD-10-CM | POA: Diagnosis not present

## 2014-04-25 DIAGNOSIS — M25472 Effusion, left ankle: Secondary | ICD-10-CM

## 2014-04-25 DIAGNOSIS — Z472 Encounter for removal of internal fixation device: Secondary | ICD-10-CM | POA: Diagnosis not present

## 2014-04-25 HISTORY — DX: Type 2 diabetes mellitus without complications: E11.9

## 2014-04-25 HISTORY — DX: Spinal stenosis, lumbar region without neurogenic claudication: M48.061

## 2014-04-25 HISTORY — DX: Radiculopathy, lumbar region: M54.16

## 2014-04-25 HISTORY — DX: Anxiety disorder, unspecified: F41.9

## 2014-04-25 LAB — CBC WITH DIFFERENTIAL/PLATELET
BASOS PCT: 0 % (ref 0–1)
Basophils Absolute: 0 10*3/uL (ref 0.0–0.1)
EOS ABS: 0 10*3/uL (ref 0.0–0.7)
Eosinophils Relative: 0 % (ref 0–5)
HCT: 30.9 % — ABNORMAL LOW (ref 36.0–46.0)
HEMOGLOBIN: 10.2 g/dL — AB (ref 12.0–15.0)
LYMPHS ABS: 1.2 10*3/uL (ref 0.7–4.0)
Lymphocytes Relative: 12 % (ref 12–46)
MCH: 30.2 pg (ref 26.0–34.0)
MCHC: 33 g/dL (ref 30.0–36.0)
MCV: 91.4 fL (ref 78.0–100.0)
Monocytes Absolute: 0.8 10*3/uL (ref 0.1–1.0)
Monocytes Relative: 8 % (ref 3–12)
NEUTROS ABS: 7.6 10*3/uL (ref 1.7–7.7)
NEUTROS PCT: 80 % — AB (ref 43–77)
PLATELETS: 315 10*3/uL (ref 150–400)
RBC: 3.38 MIL/uL — AB (ref 3.87–5.11)
RDW: 15.2 % (ref 11.5–15.5)
WBC: 9.6 10*3/uL (ref 4.0–10.5)

## 2014-04-25 LAB — URINALYSIS, ROUTINE W REFLEX MICROSCOPIC
Bilirubin Urine: NEGATIVE
Glucose, UA: NEGATIVE mg/dL
HGB URINE DIPSTICK: NEGATIVE
Ketones, ur: NEGATIVE mg/dL
Leukocytes, UA: NEGATIVE
Nitrite: NEGATIVE
PROTEIN: 30 mg/dL — AB
Specific Gravity, Urine: 1.02 (ref 1.005–1.030)
UROBILINOGEN UA: 1 mg/dL (ref 0.0–1.0)
pH: 5.5 (ref 5.0–8.0)

## 2014-04-25 LAB — COMPREHENSIVE METABOLIC PANEL
ALBUMIN: 2.4 g/dL — AB (ref 3.5–5.2)
ALK PHOS: 142 U/L — AB (ref 39–117)
ALT: 12 U/L (ref 0–35)
AST: 15 U/L (ref 0–37)
BILIRUBIN TOTAL: 0.3 mg/dL (ref 0.3–1.2)
BUN: 16 mg/dL (ref 6–23)
CHLORIDE: 92 meq/L — AB (ref 96–112)
CO2: 30 mEq/L (ref 19–32)
Calcium: 9.1 mg/dL (ref 8.4–10.5)
Creatinine, Ser: 0.82 mg/dL (ref 0.50–1.10)
GFR calc Af Amer: 81 mL/min — ABNORMAL LOW (ref >90)
GFR calc non Af Amer: 70 mL/min — ABNORMAL LOW (ref >90)
Glucose, Bld: 238 mg/dL — ABNORMAL HIGH (ref 70–99)
POTASSIUM: 3.5 meq/L — AB (ref 3.7–5.3)
SODIUM: 134 meq/L — AB (ref 137–147)
TOTAL PROTEIN: 7 g/dL (ref 6.0–8.3)

## 2014-04-25 LAB — URINE MICROSCOPIC-ADD ON

## 2014-04-25 LAB — GLUCOSE, CAPILLARY
GLUCOSE-CAPILLARY: 231 mg/dL — AB (ref 70–99)
Glucose-Capillary: 199 mg/dL — ABNORMAL HIGH (ref 70–99)

## 2014-04-25 LAB — LACTIC ACID, PLASMA: Lactic Acid, Venous: 1.3 mmol/L (ref 0.5–2.2)

## 2014-04-25 LAB — HEMOGLOBIN A1C
Hgb A1c MFr Bld: 7 % — ABNORMAL HIGH (ref <5.7)
MEAN PLASMA GLUCOSE: 154 mg/dL — AB (ref <117)

## 2014-04-25 MED ORDER — ACETAMINOPHEN 325 MG PO TABS: 650.0000 mg | ORAL_TABLET | Freq: Four times a day (QID) | ORAL | Status: DC | PRN

## 2014-04-25 MED ORDER — SODIUM CHLORIDE 0.9 % IV SOLN: INTRAVENOUS | Status: AC

## 2014-04-25 MED ORDER — SODIUM CHLORIDE 0.9 % IV SOLN
INTRAVENOUS | Status: DC
  Administered 2014-04-25: 13:00:00 via INTRAVENOUS

## 2014-04-25 MED ORDER — MORPHINE SULFATE 2 MG/ML IJ SOLN: 2.0000 mg | INTRAMUSCULAR | Status: DC | PRN

## 2014-04-25 MED ORDER — HEPARIN SODIUM (PORCINE) 5000 UNIT/ML IJ SOLN
5000.0000 [IU] | Freq: Three times a day (TID) | INTRAMUSCULAR | Status: DC
  Administered 2014-04-25: 5000 [IU] via SUBCUTANEOUS
  Filled 2014-04-25 (×5): qty 1

## 2014-04-25 MED ORDER — VANCOMYCIN HCL 10 G IV SOLR
2000.0000 mg | INTRAVENOUS | Status: AC
  Administered 2014-04-25: 2000 mg via INTRAVENOUS
  Filled 2014-04-25: qty 2000

## 2014-04-25 MED ORDER — ONDANSETRON HCL 4 MG/2ML IJ SOLN: 4.0000 mg | Freq: Four times a day (QID) | INTRAMUSCULAR | Status: DC | PRN

## 2014-04-25 MED ORDER — VANCOMYCIN HCL 10 G IV SOLR
2000.0000 mg | Freq: Once | INTRAVENOUS | Status: DC
  Filled 2014-04-25: qty 2000

## 2014-04-25 MED ORDER — ONDANSETRON 4 MG PO TBDP: 8.0000 mg | ORAL_TABLET | Freq: Once | ORAL | Status: DC

## 2014-04-25 MED ORDER — ONDANSETRON HCL 4 MG PO TABS: 4.0000 mg | ORAL_TABLET | Freq: Four times a day (QID) | ORAL | Status: DC | PRN

## 2014-04-25 MED ORDER — HYDROCODONE-ACETAMINOPHEN 5-500 MG PO TABS: 2.0000 | ORAL_TABLET | Freq: Every day | ORAL | Status: DC

## 2014-04-25 MED ORDER — GADOBENATE DIMEGLUMINE 529 MG/ML IV SOLN
20.0000 mL | Freq: Once | INTRAVENOUS | Status: AC
  Administered 2014-04-25: 20 mL via INTRAVENOUS

## 2014-04-25 MED ORDER — LISINOPRIL 10 MG PO TABS
10.0000 mg | ORAL_TABLET | Freq: Every day | ORAL | Status: DC
  Administered 2014-04-25 – 2014-05-02 (×8): 10 mg via ORAL
  Filled 2014-04-25 (×8): qty 1

## 2014-04-25 MED ORDER — VANCOMYCIN HCL IN DEXTROSE 1-5 GM/200ML-% IV SOLN
1000.0000 mg | Freq: Two times a day (BID) | INTRAVENOUS | Status: DC
  Administered 2014-04-26 – 2014-04-27 (×4): 1000 mg via INTRAVENOUS
  Filled 2014-04-25 (×7): qty 200

## 2014-04-25 MED ORDER — DOCUSATE SODIUM 100 MG PO CAPS
200.0000 mg | ORAL_CAPSULE | Freq: Every day | ORAL | Status: DC
Start: 2014-04-25 — End: 2014-04-28
  Administered 2014-04-25 – 2014-04-27 (×3): 200 mg via ORAL
  Filled 2014-04-25 (×4): qty 2

## 2014-04-25 MED ORDER — PREGABALIN 75 MG PO CAPS
150.0000 mg | ORAL_CAPSULE | Freq: Two times a day (BID) | ORAL | Status: DC
  Administered 2014-04-25 – 2014-05-01 (×12): 150 mg via ORAL
  Filled 2014-04-25 (×3): qty 2
  Filled 2014-04-25: qty 6
  Filled 2014-04-25 (×6): qty 2
  Filled 2014-04-25: qty 6
  Filled 2014-04-25 (×2): qty 2

## 2014-04-25 MED ORDER — ALUM & MAG HYDROXIDE-SIMETH 200-200-20 MG/5ML PO SUSP: 30.0000 mL | Freq: Four times a day (QID) | ORAL | Status: DC | PRN

## 2014-04-25 MED ORDER — PIPERACILLIN-TAZOBACTAM 3.375 G IVPB 30 MIN
3.3750 g | Freq: Once | INTRAVENOUS | Status: AC
  Administered 2014-04-25: 3.375 g via INTRAVENOUS
  Filled 2014-04-25: qty 50

## 2014-04-25 MED ORDER — HYDROCODONE-ACETAMINOPHEN 5-325 MG PO TABS
2.0000 | ORAL_TABLET | Freq: Every day | ORAL | Status: DC
  Administered 2014-04-25 – 2014-04-30 (×6): 2 via ORAL
  Filled 2014-04-25 (×6): qty 2

## 2014-04-25 MED ORDER — INSULIN ASPART 100 UNIT/ML ~~LOC~~ SOLN
0.0000 [IU] | Freq: Three times a day (TID) | SUBCUTANEOUS | Status: DC
  Administered 2014-04-25: 2 [IU] via SUBCUTANEOUS
  Administered 2014-04-26: 3 [IU] via SUBCUTANEOUS
  Administered 2014-04-26 – 2014-04-27 (×3): 2 [IU] via SUBCUTANEOUS
  Administered 2014-04-27: 5 [IU] via SUBCUTANEOUS
  Administered 2014-04-28 (×3): 2 [IU] via SUBCUTANEOUS
  Administered 2014-04-29: 1 [IU] via SUBCUTANEOUS
  Administered 2014-04-29 (×2): 2 [IU] via SUBCUTANEOUS
  Administered 2014-04-30: 1 [IU] via SUBCUTANEOUS
  Administered 2014-04-30 – 2014-05-01 (×3): 2 [IU] via SUBCUTANEOUS
  Administered 2014-05-01 – 2014-05-02 (×2): 1 [IU] via SUBCUTANEOUS

## 2014-04-25 MED ORDER — ACETAMINOPHEN 650 MG RE SUPP: 650.0000 mg | Freq: Four times a day (QID) | RECTAL | Status: DC | PRN

## 2014-04-25 MED ORDER — LEVOTHYROXINE SODIUM 100 MCG PO TABS
100.0000 ug | ORAL_TABLET | Freq: Every day | ORAL | Status: DC
  Administered 2014-04-26 – 2014-05-02 (×7): 100 ug via ORAL
  Filled 2014-04-25 (×10): qty 1

## 2014-04-25 MED ORDER — PANTOPRAZOLE SODIUM 40 MG PO TBEC
40.0000 mg | DELAYED_RELEASE_TABLET | Freq: Every day | ORAL | Status: DC
  Administered 2014-04-25 – 2014-05-01 (×7): 40 mg via ORAL
  Filled 2014-04-25 (×7): qty 1

## 2014-04-25 MED ORDER — PIPERACILLIN-TAZOBACTAM 3.375 G IVPB
3.3750 g | Freq: Three times a day (TID) | INTRAVENOUS | Status: DC
  Administered 2014-04-25 – 2014-05-01 (×17): 3.375 g via INTRAVENOUS
  Filled 2014-04-25 (×20): qty 50

## 2014-04-25 MED ORDER — PREGABALIN 75 MG PO CAPS: 150.0000 mg | ORAL_CAPSULE | Freq: Two times a day (BID) | ORAL | Status: DC

## 2014-04-25 NOTE — ED Notes (Signed)
Pt still at MRI

## 2014-04-25 NOTE — Procedures (Signed)
Interventional Radiology Procedure Note  Procedure: Aspiration of left ankle Complications: None Recommendations: - Begin broad spectrum abx - Gram stain and cx are pending  Signed,  Criselda Peaches, MD [facility] Radiology

## 2014-04-25 NOTE — H&P (Signed)
[facility]                                                                                    Patient Demographics  Kathryn Ryan, is a 73 y.o. female  MRN: [id number]   DOB - 1941/09/06  Admit Date - 04/25/2014  Outpatient Primary MD for the patient is Gar Ponto, MD Orthopedic surgeon is Dr. Wylene Simmer  With History of -  Past Medical History  Diagnosis Date  . Hypertension   . Hypothyroidism   . GERD (gastroesophageal reflux disease)   . H/O hiatal hernia   . Neuropathy     non related to DM-not sure why  . Neuropathy     non DM related  . Anxiety   . Spinal stenosis of lumbar region   . Lumbar radiculopathy       Past Surgical History  Procedure Laterality Date  . Back surgery    . Abdominal hysterectomy    . Bunionectomy      bilateral  . Cholecystectomy    . Breast surgery      bilateral lumpectomies  . Left oophorectomy    . Tonsillectomy      6 yrs    in for   Chief Complaint  Patient presents with  . Wound Check     HPI  Kathryn Ryan  is a 73 y.o. female past medical history of a spinal stenosis and hypertension, had history of open reduction internal fixation of left ankle done in [place], [place]. Last November. Patient said this is done for insufficient ligaments rather than fracture. Patient reported problems with her left ankle since surgery, she is having pain especially with weightbearing, for the past several weeks she noticed some swelling. Since about a week ago she started to have redness and severe tenderness. She went to see Dr. Doran Durand as outpatient, she was scheduled today at 04/25/2014 for outpatient surgery for hardware removal, when she was evaluated by the anesthesiologist thought she needed to be sent to the hospital for IV antibiotics.   In the ER:  Xray indicates an abscess in the left ankle and hindfoot.  Serologies do not appear to indicate sepsis.  Vital signs are stable.  U/A appears borderline for infection, her fasting  blood glucose is 238.  MRI is done and report is pending.  Dr. Doran Durand and Dr. Johnnye Sima has been consulted.  Review of Systems    In addition to the HPI above No Fever-chills, No Headache, No changes with Vision or hearing, No problems swallowing food or Liquids, No Chest pain, Cough or Shortness of Breath, No Abdominal pain, No Nausea or Vommitting, Bowel movements are regular, No Blood in stool or Urine, No dysuria, No new skin rashes or bruises, No new joints pains-aches,  No new weakness, tingling, numbness in any extremity, No recent weight gain or loss, No polyuria, polydypsia or polyphagia, No significant Mental Stressors.  A full 10 point Review of Systems was done, except as stated above, all other Review of Systems were negative.   Social History History  Substance Use Topics  . Smoking status: Former Smoker -- 2.00 packs/day for 25 years  Types: Cigarettes    Quit date: 02/23/1995  . Smokeless tobacco: Never Used  . Alcohol Use: No     Family History No family history on file.   Prior to Admission medications   Medication Sig Start Date End Date Taking? Authorizing Provider  Docusate Sodium (COLACE PO) Take 3 capsules by mouth at bedtime.    Yes Historical Provider, MD  esomeprazole (NEXIUM) 40 MG capsule Take 40 mg by mouth at bedtime.    Yes Historical Provider, MD  HYDROcodone-acetaminophen (VICODIN) 5-500 MG per tablet Take 2 tablets by mouth at bedtime. For pain   Yes Historical Provider, MD  levothyroxine (SYNTHROID, LEVOTHROID) 100 MCG tablet Take 100 mcg by mouth daily before breakfast.   Yes Historical Provider, MD  lisinopril-hydrochlorothiazide (PRINZIDE,ZESTORETIC) 20-25 MG per tablet Take 1 tablet by mouth daily.   Yes Historical Provider, MD  Multiple Vitamin (MULITIVITAMIN WITH MINERALS) TABS Take 1 tablet by mouth daily.   Yes Historical Provider, MD  pregabalin (LYRICA) 150 MG capsule Take 150 mg by mouth 2 (two) times daily.   Yes Historical  Provider, MD    Allergies  Allergen Reactions  . Statins Other (See Comments)    Joint pain   . Sulfa Antibiotics Itching    Physical Exam   Vitals  Blood pressure 119/60, pulse 101, temperature 98.9 F (37.2 C), temperature source Oral, resp. rate 23, SpO2 93.00%. General appearance: alert, cooperative and no distress  Head: Normocephalic, without obvious abnormality, atraumatic  Eyes: conjunctivae/corneas clear. PERRL, EOM's intact. Fundi benign.  Nose: Nares normal. Septum midline. Mucosa normal. No drainage or sinus tenderness.  Throat: lips, mucosa, and tongue normal; teeth and gums normal  Neck: Supple, no masses, no cervical lymphadenopathy, no JVD appreciated, no meningeal signs Resp: clear to auscultation bilaterally  Chest wall: no tenderness  Cardio: regular rate and rhythm, S1, S2 normal, no murmur, click, rub or gallop  GI: soft, non-tender; bowel sounds normal; no masses, no organomegaly  Extremities: extremities normal, atraumatic, no cyanosis or edema  Skin: Skin color, texture, turgor normal. No rashes or lesions  Neurologic: Alert and oriented X 3, normal strength and tone. Normal symmetric reflexes. Normal coordination and gait  Picture taken at 04/25/2014 at 4:45 PM, left ankle     Data Review  CBC  Recent Labs Lab 04/25/14 1229  WBC 9.6  HGB 10.2*  HCT 30.9*  PLT 315  MCV 91.4  MCH 30.2  MCHC 33.0  RDW 15.2  LYMPHSABS 1.2  MONOABS 0.8  EOSABS 0.0  BASOSABS 0.0   ------------------------------------------------------------------------------------------------------------------  Chemistries   Recent Labs Lab 04/25/14 1229  NA 134*  K 3.5*  CL 92*  CO2 30  GLUCOSE 238*  BUN 16  CREATININE 0.82  CALCIUM 9.1  AST 15  ALT 12  ALKPHOS 142*  BILITOT 0.3    Urinalysis    Component Value Date/Time   COLORURINE YELLOW 04/25/2014 1255   APPEARANCEUR HAZY* 04/25/2014 1255   LABSPEC 1.020 04/25/2014 1255   PHURINE 5.5 04/25/2014 [place] 04/25/2014 [place] 04/25/2014 [place] 04/25/2014 [place] 04/25/2014 1255   PROTEINUR 30* 04/25/2014 1255   UROBILINOGEN 1.0 04/25/2014 1255   NITRITE NEGATIVE 04/25/2014 [place] 04/25/2014 1255    ----------------------------------------------------------------------------------------------------------------  Imaging results:   Dg Ankle Complete Left  04/25/2014   CLINICAL DATA:  Wound infection  EXAM: LEFT ANKLE COMPLETE - 3+ VIEW  COMPARISON:  None.  FINDINGS: Frontal, oblique, and lateral views were obtained. Two screws transfix with the hindfoot, including the anterior subtalar joint from a medial approach. Both screws have fractures in their respective mid portions.  There is marked soft tissue swelling throughout the ankle joint with foci of soft tissue air medially concerning for abscess. No acute fracture is seen. The ankle mortise appears intact. There is fluid in the joint. 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 There also surgical screws transfixing the calcaneocuboid joint region.  IMPRESSION: Marked soft tissue swelling with air medially. Suspect abscess within the soft tissues surrounding the ankle and hindfoot. There is postoperative change with 2 screws transfixing the anterior subtalar joint. There are fractures in the mid portions of each screw. There are also screws to the calcaneocuboid joint. The screws do not appear fractured. No acute fracture. Joint effusion which could potentially represent infection within the joint.  Given these findings, it may be prudent to consider contrast enhanced CT to further evaluate.  Critical Value/emergent results were called by telephone at the time of interpretation on 04/25/2014 at 1:33 PM to Dr. Francine Graven , who verbally acknowledged these results.   Electronically Signed   By: Lowella Grip M.D.   On: 04/25/2014 13:34        Assessment &  Plan  Active Problems:   Cellulitis of left ankle  Infected hardware with abscess in the left ankle Patient received 1 dose of Zosyn in the ED.  Will hold on further antibiotics until blood and tissue cultures can be obtained. Discussed with ID, Dr. Johnnye Sima, who will see the patient. Dr. Doran Durand is consulted.  I will make the patient NPO after midnight. Pain medications Patient will be on heparin for DVT prophylaxis.  UTI? Will send urine for culture. Holding antibiotics until tissue cultures can be obtained.  Hyperglycemia Patient has no documented history of DM. Patient said she presented to the surgery n.p.o., her fasting glucose is 238, this is diagnostic for diabetes. Will check a hgb A1c and place her on SSI sensitive.  Hypothyroid Will continue Synthroid   HTN Holding HCTZ.  Will continue Lisinopril. Continue   Neuropathy Has a history of claudication with 2 spinal surgeries Continue Lyrica.  GERD  Continue PPI   DVT Prophylaxis  Heparin   AM Labs Ordered, also please review Full Orders  Family Communication:      Code Status:  full  Likely DC to  SNF.  To be determined. Condition:  Stable.  Time spent in minutes : 70 minutes    Bobby Rumpf JEHU PA-C on 04/25/2014 at 4:06 PM  Between 7am to 7pm - Pager - [phone] After 7pm go to [email or web address] - password [email or web address] And look for the night coverage person covering me after hours  [place]  [phone]   Addendum  Patient seen and examined, chart and data base reviewed.  I agree with the above assessment and plan.  For full details please see Mrs. Imogene Burn PA note.  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  Hold antibiotics, patient got Zosyn, restart antibiotics after obtaining deep tissue culture.  Patient reportedly will have removal of hardware in am.   Birdie Hopes, MD [facility] Pager: [phone] 04/25/2014, 4:38 PM

## 2014-04-25 NOTE — ED Provider Notes (Signed)
CSN: [id number]     Arrival date & time 04/25/14  1141 History   First MD Initiated Contact with Patient 04/25/14 1159     Chief Complaint  Patient presents with  . Wound Check      HPI Pt was seen at 1235. [facility] and pt, c/o gradual onset and worsening of persistent left medial ankle "swelling" and "redness" for the past several months, worse over the past week. Pt states she was evaluated by Ortho Dr. Doran Durand for same, and "was going to have surgery today to take out the pins in my ankle." [facility] Anesthesiologist transferred pt to the ED for further evaluation and hospital admission "for a possible systemic infection." Pt states she has had generalized weakness/fatigue for the past 3 to 4 days. Denies fevers, no injury, no CP/SOB, no abd pain, no N/V/D, no focal motor weakness, no tingling/numbness in extremity.     Past Medical History  Diagnosis Date  . Hypertension   . Hypothyroidism   . GERD (gastroesophageal reflux disease)   . H/O hiatal hernia   . Neuropathy     non related to DM-not sure why  . Neuropathy     non DM related  . Anxiety    Past Surgical History  Procedure Laterality Date  . Back surgery    . Abdominal hysterectomy    . Bunionectomy      bilateral  . Cholecystectomy    . Breast surgery      bilateral lumpectomies  . Left oophorectomy    . Tonsillectomy      6 yrs    History  Substance Use Topics  . Smoking status: Former Smoker -- 2.00 packs/day for 25 years    Types: Cigarettes    Quit date: 02/23/1995  . Smokeless tobacco: Never Used  . Alcohol Use: No    Review of Systems ROS: Statement: All systems negative except as marked or noted in the HPI; Constitutional: Negative for fever and chills. ; ; Eyes: Negative for eye pain, redness and discharge. ; ; ENMT: Negative for ear pain, hoarseness, nasal congestion, sinus pressure and sore throat. ; ; Cardiovascular: Negative for chest pain, palpitations, diaphoresis,  dyspnea and peripheral edema. ; ; Respiratory: Negative for cough, wheezing and stridor. ; ; Gastrointestinal: Negative for nausea, vomiting, diarrhea, abdominal pain, blood in stool, hematemesis, jaundice and rectal bleeding. . ; ; Genitourinary: Negative for dysuria, flank pain and hematuria. ; ; Musculoskeletal: Negative for back pain and neck pain. Negative for trauma.; ; Skin: +rash and swelling left ankle. Negative for pruritus, abrasions, blisters, bruising and skin lesion.; ; Neuro: Negative for headache, lightheadedness and neck stiffness. Negative for weakness, altered level of consciousness , altered mental status, extremity weakness, paresthesias, involuntary movement, seizure and syncope.      Allergies  Sulfa antibiotics  Home Medications   Prior to Admission medications   Medication Sig Start Date End Date Taking? Authorizing Provider  Calcium Carbonate-Vitamin D (CALCIUM + D PO) Take 1 tablet by mouth daily.    Historical Provider, MD  desvenlafaxine (PRISTIQ) 50 MG 24 hr tablet Take 50 mg by mouth daily.    Historical Provider, MD  Docusate Sodium (COLACE PO) Take 5 capsules by mouth daily.    Historical Provider, MD  esomeprazole (NEXIUM) 40 MG capsule Take 40 mg by mouth daily before breakfast.    Historical Provider, MD  estrogens, conjugated, (PREMARIN) 1.25 MG tablet Take 1.25 mg by mouth daily.    Historical  Provider, MD  fentaNYL (DURAGESIC - DOSED MCG/HR) 100 MCG/HR Place 2 patches onto the skin every 3 (three) days.    Historical Provider, MD  gabapentin (NEURONTIN) 800 MG tablet Take 800 mg by mouth 4 (four) times daily.    Historical Provider, MD  HYDROcodone-acetaminophen (VICODIN) 5-500 MG per tablet Take 2 tablets by mouth every 6 (six) hours as needed. For pain    Historical Provider, MD  levothyroxine (SYNTHROID, LEVOTHROID) 50 MCG tablet Take 50 mcg by mouth daily.    Historical Provider, MD  lisinopril-hydrochlorothiazide (PRINZIDE,ZESTORETIC) 20-25 MG per  tablet Take 1 tablet by mouth daily.    Historical Provider, MD  Multiple Vitamin (MULITIVITAMIN WITH MINERALS) TABS Take 1 tablet by mouth daily.    Historical Provider, MD   BP 123/63  Pulse 105  Temp(Src) 98.9 F (37.2 C) (Oral)  Resp 18  SpO2 92% Physical Exam 1240: Physical examination:  Nursing notes reviewed; Vital signs and O2 SAT reviewed;  Constitutional: Well developed, Well nourished, Well hydrated, In no acute distress; Head:  Normocephalic, atraumatic; Eyes: EOMI, PERRL, No scleral icterus; ENMT: Mouth and pharynx normal, Mucous membranes moist; Neck: Supple, Full range of motion, No lymphadenopathy; Cardiovascular: Regular rate and rhythm, No gallop; Respiratory: Breath sounds clear & equal bilaterally, No wheezes.  Speaking full sentences with ease, Normal respiratory effort/excursion; Chest: Nontender, Movement normal; Abdomen: Soft, Nontender, Nondistended, Normal bowel sounds; Genitourinary: No CVA tenderness; Extremities: Pulses normal, +localized left medial ankle edema, erythema, and tenderness to palp. +small scab at medial mid-arch.  No drainage. No streaking. No calf edema or asymmetry.; Neuro: AA&Ox3, Major CN grossly intact.  Speech clear. No gross focal motor or sensory deficits in extremities.; Skin: Color normal, Warm, Dry.   ED Course  Procedures     EKG Interpretation None      MDM  MDM Reviewed: previous chart, nursing note and vitals Reviewed previous: labs Interpretation: labs and x-ray    Results for orders placed during the hospital encounter of 04/25/14  CBC WITH DIFFERENTIAL      Result Value Ref Range   WBC 9.6  4.0 - 10.5 K/uL   RBC 3.38 (*) 3.87 - 5.11 MIL/uL   Hemoglobin 10.2 (*) 12.0 - 15.0 g/dL   HCT 30.9 (*) 36.0 - 46.0 %   MCV 91.4  78.0 - 100.0 fL   MCH 30.2  26.0 - 34.0 pg   MCHC 33.0  30.0 - 36.0 g/dL   RDW 15.2  11.5 - 15.5 %   Platelets 315  150 - 400 K/uL   Neutrophils Relative % 80 (*) 43 - 77 %   Neutro Abs 7.6  1.7 -  7.7 K/uL   Lymphocytes Relative 12  12 - 46 %   Lymphs Abs 1.2  0.7 - 4.0 K/uL   Monocytes Relative 8  3 - 12 %   Monocytes Absolute 0.8  0.1 - 1.0 K/uL   Eosinophils Relative 0  0 - 5 %   Eosinophils Absolute 0.0  0.0 - 0.7 K/uL   Basophils Relative 0  0 - 1 %   Basophils Absolute 0.0  0.0 - 0.1 K/uL  COMPREHENSIVE METABOLIC PANEL      Result Value Ref Range   Sodium 134 (*) 137 - 147 mEq/L   Potassium 3.5 (*) 3.7 - 5.3 mEq/L   Chloride 92 (*) 96 - 112 mEq/L   CO2 30  19 - 32 mEq/L   Glucose, Bld 238 (*) 70 - 99 mg/dL  BUN 16  6 - 23 mg/dL   Creatinine, Ser 0.82  0.50 - 1.10 mg/dL   Calcium 9.1  8.4 - 10.5 mg/dL   Total Protein 7.0  6.0 - 8.3 g/dL   Albumin 2.4 (*) 3.5 - 5.2 g/dL   AST 15  0 - 37 U/L   ALT 12  0 - 35 U/L   Alkaline Phosphatase 142 (*) 39 - 117 U/L   Total Bilirubin 0.3  0.3 - 1.2 mg/dL   GFR calc non Af Amer 70 (*) >90 mL/min   GFR calc Af Amer 81 (*) >90 mL/min  LACTIC ACID, PLASMA      Result Value Ref Range   Lactic Acid, Venous 1.3  0.5 - 2.2 mmol/L  URINALYSIS, ROUTINE W REFLEX MICROSCOPIC      Result Value Ref Range   Color, Urine YELLOW  YELLOW   APPearance HAZY (*) CLEAR   Specific Gravity, Urine 1.020  1.005 - 1.030   pH 5.5  5.0 - 8.0   Glucose, UA NEGATIVE  NEGATIVE mg/dL   Hgb urine dipstick NEGATIVE  NEGATIVE   Bilirubin Urine NEGATIVE  NEGATIVE   Ketones, ur NEGATIVE  NEGATIVE mg/dL   Protein, ur 30 (*) NEGATIVE mg/dL   Urobilinogen, UA 1.0  0.0 - 1.0 mg/dL   Nitrite NEGATIVE  NEGATIVE   Leukocytes, UA NEGATIVE  NEGATIVE  URINE MICROSCOPIC-ADD ON      Result Value Ref Range   Squamous Epithelial / LPF MANY (*) RARE   WBC, UA 7-10  <3 WBC/hpf   RBC / HPF 0-2  <3 RBC/hpf   Bacteria, UA MANY (*) RARE   Urine-Other MUCOUS PRESENT     Dg Ankle Complete Left 04/25/2014   CLINICAL DATA:  Wound infection  EXAM: LEFT ANKLE COMPLETE - 3+ VIEW  COMPARISON:  None.  FINDINGS: Frontal, oblique, and lateral views were obtained. Two screws transfix  with the hindfoot, including the anterior subtalar joint from a medial approach. Both screws have fractures in their respective mid portions.  There is marked soft tissue swelling throughout the ankle joint with foci of soft tissue air medially concerning for abscess. No acute fracture is seen. The ankle mortise appears intact. There is fluid in the joint. 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 There also surgical screws transfixing the calcaneocuboid joint region.  IMPRESSION: Marked soft tissue swelling with air medially. Suspect abscess within the soft tissues surrounding the ankle and hindfoot. There is postoperative change with 2 screws transfixing the anterior subtalar joint. There are fractures in the mid portions of each screw. There are also screws to the calcaneocuboid joint. The screws do not appear fractured. No acute fracture. Joint effusion which could potentially represent infection within the joint.  Given these findings, it may be prudent to consider contrast enhanced CT to further evaluate.  Critical Value/emergent results were called by telephone at the time of interpretation on 04/25/2014 at 1:33 PM to Dr. Francine Graven , who verbally acknowledged these results.   Electronically Signed   By: Lowella Grip M.D.   On: 04/25/2014 13:34    1405:  Will start IV abx, admit. Dx and testing d/w pt and family.  Questions answered.  Verb understanding, agreeable to admit. T/C to Ortho Dr. Doran Durand, case discussed, including:  HPI, pertinent PM/SHx, VS/PE, dx testing, ED course and treatment:  Agreeable to consult, requests to obtain MRI left ankle w/wo contrast, admit to medicine service. T/C to [facility]/Dr. Hartford Poli, case discussed, including:  HPI, pertinent PM/SHx, VS/PE, dx testing, ED course and treatment:  Agreeable to admit, requests to write temporary orders, obtain medical bed to team 10.   Alfonzo Feller, DO 04/28/14 2354

## 2014-04-25 NOTE — ED Notes (Signed)
Per EMS - pt coming from [facility]opedic surgical center. Pt has screw in left ankle, pt had a torn tendon and had it repaired in November and has problems ever since then. HX HTN and DM. Pt has swelling and redness to left ankle. HR 112. BP 120/60. surgical center administered 500 ml of LR. 22 G in left hand.

## 2014-04-25 NOTE — ED Notes (Addendum)
   JUDY MULLIS (Friend)    [phone]

## 2014-04-25 NOTE — ED Notes (Signed)
Pt reports she has noticed an increase in swelling the left ankle and redness, along with increased fatigue. Denies fevers at home. Sent over from the surgical center for further evaluation d/t ST and possible infection. Pt was supposed to have the screw in ankle removed this morning.

## 2014-04-25 NOTE — ED Notes (Signed)
Pt returned from radiology.

## 2014-04-25 NOTE — Progress Notes (Signed)
   MRI come back showed abscess and questionable septic arthritis.  Spoke with Dr. Doran Durand, probably will not be able to operate in the morning.  Dr. Doran Durand recommended IR aspiration of the joint/abscess so antibiotic can be started ASAP.  Spoke with Dr. Laurence Ferrari of IR, he will try to aspirate it tonight.  I placed an order to start Zosyn and vancomycin after joint/abscess aspiration.    Birdie Hopes, MD [facility] Pager: [phone] 04/25/2014, 5:44 PM

## 2014-04-25 NOTE — Consult Note (Signed)
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  Check her A1C Dr Doran Durand for possible OR in AM.  Will need PIC BCx pending.  Comment  She is to have hardware out in AM (most likely). She clearly has infected ankle on exam.  Thank you so much for this interesting consult,   Campbell Riches (pager) [phone] [email or web address]  Kathryn Ryan is an 73 y.o. female.  HPI:  73 yo F with hx of tendon tear in L ankle 10-19-13 repaired with screws in [place]. She had a plastic brace fitted for her ankle that she has worn since then. Due to her neuropathy, this has irritated the skin on her ankle. She was seen by ortho last week and noted to have a grossly swollen ankle as well as bloody d/c from her wound. She denies proximal erythema. She was planned to have screws removed today at outpt surgery but anesthesia felt she was too high risk.    Past Medical History  Diagnosis Date  . Hypertension   . Hypothyroidism   . GERD (gastroesophageal reflux disease)   . H/O hiatal hernia   . Neuropathy     non related to DM-not sure why  . Neuropathy     non DM related  . Anxiety   . Spinal stenosis of lumbar region   . Lumbar radiculopathy     Past Surgical History  Procedure Laterality Date  . Back surgery    . Abdominal hysterectomy    . Bunionectomy      bilateral  . Cholecystectomy    . Breast surgery      bilateral lumpectomies  . Left oophorectomy    . Tonsillectomy      7 yrs     Allergies  Allergen Reactions  . Statins Other (See Comments)    Joint pain   . Sulfa Antibiotics Itching    Medications:  Prior to Admission:  (Not in a hospital admission) Scheduled:   Abtx:  Anti-infectives   Start     Dose/Rate Route Frequency Ordered Stop   04/25/14 1400  vancomycin (VANCOCIN) 2,000 mg in sodium chloride 0.9 % 500 mL IVPB  Status:  Discontinued     2,000 mg 250 mL/hr over 120 Minutes Intravenous  Once 04/25/14 1356 04/25/14 1515   04/25/14 1400  piperacillin-tazobactam (ZOSYN) IVPB 3.375 g     3.375 g 100 mL/hr over 30 Minutes Intravenous  Once 04/25/14 1356 04/25/14 1624      Total days of antibiotics 0 (got zosyn x 1 in ED)          Social History:  reports that she quit smoking about 19 years ago. Her smoking use included Cigarettes. She has a 50 pack-year smoking history. She has never used smokeless tobacco. She reports that she does not drink alcohol or use illicit drugs.  No family history on file.  General ROS: has had chills, no fever. normal BM, normal urine. eating well. no change in wt. has had FSG in the 200s. Has had clear wound d/c for the last week. see HPI.    Blood pressure 119/60, pulse 101, temperature 98.9 F (37.2  C), temperature source Oral, resp. rate 23, SpO2 93.00%. General appearance: alert, cooperative and no distress Eyes: negative findings: conjunctivae and sclerae normal and pupils equal, round, reactive to light and accomodation Throat: normal findings: oropharynx pink & moist without lesions or evidence of thrush Neck: no adenopathy and supple, symmetrical, trachea midline Lungs: clear to auscultation bilaterally Heart: regular rate and rhythm Abdomen: normal findings: bowel sounds normal and soft, non-tender Extremities: edema trace. pulses normal in LE (dorsalis pedis). Her L ankle is swollen/erythematous, no proximal erythema. there is bubbling of the skin. there is increase in heat.  and crusting papules diffusely.    Results for orders placed during the hospital encounter of 04/25/14 (from the past 48 hour(s))  CBC WITH DIFFERENTIAL      Status: Abnormal   Collection Time    04/25/14 12:29 PM      Result Value Ref Range   WBC 9.6  4.0 - 10.5 K/uL   RBC 3.38 (*) 3.87 - 5.11 MIL/uL   Hemoglobin 10.2 (*) 12.0 - 15.0 g/dL   HCT 30.9 (*) 36.0 - 46.0 %   MCV 91.4  78.0 - 100.0 fL   MCH 30.2  26.0 - 34.0 pg   MCHC 33.0  30.0 - 36.0 g/dL   RDW 15.2  11.5 - 15.5 %   Platelets 315  150 - 400 K/uL   Neutrophils Relative % 80 (*) 43 - 77 %   Neutro Abs 7.6  1.7 - 7.7 K/uL   Lymphocytes Relative 12  12 - 46 %   Lymphs Abs 1.2  0.7 - 4.0 K/uL   Monocytes Relative 8  3 - 12 %   Monocytes Absolute 0.8  0.1 - 1.0 K/uL   Eosinophils Relative 0  0 - 5 %   Eosinophils Absolute 0.0  0.0 - 0.7 K/uL   Basophils Relative 0  0 - 1 %   Basophils Absolute 0.0  0.0 - 0.1 K/uL  COMPREHENSIVE METABOLIC PANEL     Status: Abnormal   Collection Time    04/25/14 12:29 PM      Result Value Ref Range   Sodium 134 (*) 137 - 147 mEq/L   Potassium 3.5 (*) 3.7 - 5.3 mEq/L   Chloride 92 (*) 96 - 112 mEq/L   CO2 30  19 - 32 mEq/L   Glucose, Bld 238 (*) 70 - 99 mg/dL   BUN 16  6 - 23 mg/dL   Creatinine, Ser 0.82  0.50 - 1.10 mg/dL   Calcium 9.1  8.4 - 10.5 mg/dL   Total Protein 7.0  6.0 - 8.3 g/dL   Albumin 2.4 (*) 3.5 - 5.2 g/dL   AST 15  0 - 37 U/L   ALT 12  0 - 35 U/L   Alkaline Phosphatase 142 (*) 39 - 117 U/L   Total Bilirubin 0.3  0.3 - 1.2 mg/dL   GFR calc non Af Amer 70 (*) >90 mL/min   GFR calc Af Amer 81 (*) >90 mL/min   Comment: (NOTE)     The eGFR has been calculated using the CKD EPI equation.     This calculation has not been validated in all clinical situations.     eGFR's persistently <90 mL/min signify possible Chronic Kidney     Disease.  URINALYSIS, ROUTINE W REFLEX MICROSCOPIC     Status: Abnormal   Collection Time    04/25/14 12:55 PM      Result Value Ref Range   Color, Urine  YELLOW  YELLOW   APPearance HAZY (*) CLEAR   Specific Gravity, Urine 1.020  1.005 - 1.030   pH 5.5  5.0 - 8.0   Glucose, UA NEGATIVE   NEGATIVE mg/dL   Hgb urine dipstick NEGATIVE  NEGATIVE   Bilirubin Urine NEGATIVE  NEGATIVE   Ketones, ur NEGATIVE  NEGATIVE mg/dL   Protein, ur 30 (*) NEGATIVE mg/dL   Urobilinogen, UA 1.0  0.0 - 1.0 mg/dL   Nitrite NEGATIVE  NEGATIVE   Leukocytes, UA NEGATIVE  NEGATIVE  URINE MICROSCOPIC-ADD ON     Status: Abnormal   Collection Time    04/25/14 12:55 PM      Result Value Ref Range   Squamous Epithelial / LPF MANY (*) RARE   WBC, UA 7-10  <3 WBC/hpf   RBC / HPF 0-2  <3 RBC/hpf   Bacteria, UA MANY (*) RARE   Urine-Other MUCOUS PRESENT    LACTIC ACID, PLASMA     Status: None   Collection Time    04/25/14  1:00 PM      Result Value Ref Range   Lactic Acid, Venous 1.3  0.5 - 2.2 mmol/L      Component Value Date/Time   SDES URINE, CLEAN CATCH 09/14/2010 2022   SPECREQUEST NONE 09/14/2010 2022   CULT ESCHERICHIA COLI 09/14/2010 2022   REPTSTATUS 09/16/2010 FINAL 09/14/2010 2022   Dg Ankle Complete Left  04/25/2014   CLINICAL DATA:  Wound infection  EXAM: LEFT ANKLE COMPLETE - 3+ VIEW  COMPARISON:  None.  FINDINGS: Frontal, oblique, and lateral views were obtained. Two screws transfix with the hindfoot, including the anterior subtalar joint from a medial approach. Both screws have fractures in their respective mid portions.  There is marked soft tissue swelling throughout the ankle joint with foci of soft tissue air medially concerning for abscess. No acute fracture is seen. The ankle mortise appears intact. There is fluid in the joint. 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 There also surgical screws transfixing the calcaneocuboid joint region.  IMPRESSION: Marked soft tissue swelling with air medially. Suspect abscess within the soft tissues surrounding the ankle and hindfoot. There is postoperative change with 2 screws transfixing the anterior subtalar joint. There are fractures in the mid portions of each screw. There are also screws to the calcaneocuboid joint. The screws do not appear  fractured. No acute fracture. Joint effusion which could potentially represent infection within the joint.  Given these findings, it may be prudent to consider contrast enhanced CT to further evaluate.  Critical Value/emergent results were called by telephone at the time of interpretation on 04/25/2014 at 1:33 PM to Dr. Francine Graven , who verbally acknowledged these results.   Electronically Signed   By: Lowella Grip M.D.   On: 04/25/2014 13:34   No results found for this or any previous visit (from the past 240 hour(s)).    04/25/2014, 4:32 PM     LOS: 0 days     **Disclaimer: This note may have been dictated with voice recognition software. Similar sounding words can inadvertently be transcribed and this note may contain transcription errors which may not have been corrected upon publication of note.**

## 2014-04-25 NOTE — Progress Notes (Signed)
ANTIBIOTIC CONSULT NOTE - INITIAL  Pharmacy Consult for Vancomycin, Zosyn Indication: (+)ankle abscess, possible septic arthritis  Allergies  Allergen Reactions  . Statins Other (See Comments)    Joint pain   . Sulfa Antibiotics Itching    Patient Measurements: Height: 5\' 6"  (167.6 cm) Weight: 235 lb (106.595 kg) IBW/kg (Calculated) : 59.3 Adjusted Body Weight:   Vital Signs: Temp: 99.7 F (37.6 C) (05/19 1730) Temp src: Oral (05/19 1730) BP: 122/59 mmHg (05/19 1730) Pulse Rate: 99 (05/19 1730) Intake/Output from previous day:   Intake/Output from this shift:    Labs:  Recent Labs  04/25/14 1229  WBC 9.6  HGB 10.2*  PLT 315  CREATININE 0.82   Estimated Creatinine Clearance: 76.6 ml/min (by C-G formula based on Cr of 0.82). No results found for this basename: VANCOTROUGH, VANCOPEAK, VANCORANDOM, GENTTROUGH, GENTPEAK, GENTRANDOM, TOBRATROUGH, TOBRAPEAK, TOBRARND, AMIKACINPEAK, AMIKACINTROU, AMIKACIN,  in the last 72 hours   Microbiology: No results found for this or any previous visit (from the past 720 hour(s)).  Medical History: Past Medical History  Diagnosis Date  . Hypertension   . Hypothyroidism   . GERD (gastroesophageal reflux disease)   . H/O hiatal hernia   . Neuropathy     non related to DM-not sure why  . Neuropathy     non DM related  . Anxiety   . Spinal stenosis of lumbar region   . Lumbar radiculopathy     Medications:  Scheduled:  . sodium chloride   Intravenous STAT  . docusate sodium  200 mg Oral QHS  . heparin  5,000 Units Subcutaneous 3 times per day  . HYDROcodone-acetaminophen  2 tablet Oral QHS  . insulin aspart  0-9 Units Subcutaneous TID WC  . [START ON 04/26/2014] levothyroxine  100 mcg Oral QAC breakfast  . lisinopril  10 mg Oral Daily  . pantoprazole  40 mg Oral Daily  . pregabalin  150 mg Oral BID   Assessment: 73yo female s/p ORIF L-ankle in Nov '14, now with R/O septic arthrititis and questionable regarding  hardware removal in OR on 5/20.  Plan is now to do joint aspiration, then start antibiotics.  Pt did receive a dose of Zosyn in the ED but no further abx have been given.  Goal of Therapy:  Vancomycin trough level 15-20 mcg/ml  Plan:  1-  Vancomycin 2000mg  IV x 1, then 1000mg  IV q12 2-  Zosyn 3.375gm IV q8, infuse over 4hr 3-  F/U cx data and renal fxn 4-  SS Vanc trough  Gracy Bruins, [facility]

## 2014-04-26 ENCOUNTER — Inpatient Hospital Stay (HOSPITAL_COMMUNITY): Payer: Medicare Other

## 2014-04-26 ENCOUNTER — Encounter (HOSPITAL_COMMUNITY): Payer: Self-pay | Admitting: Certified Registered Nurse Anesthetist

## 2014-04-26 ENCOUNTER — Inpatient Hospital Stay (HOSPITAL_COMMUNITY): Payer: Medicare Other | Admitting: Certified Registered Nurse Anesthetist

## 2014-04-26 ENCOUNTER — Encounter (HOSPITAL_COMMUNITY): Payer: Medicare Other | Admitting: Certified Registered Nurse Anesthetist

## 2014-04-26 ENCOUNTER — Encounter (HOSPITAL_COMMUNITY)
Admission: EM | Disposition: A | Discharge status: Home Health | Payer: Self-pay | Source: Home / Self Care | Attending: Internal Medicine

## 2014-04-26 DIAGNOSIS — N390 Urinary tract infection, site not specified: Secondary | ICD-10-CM | POA: Diagnosis not present

## 2014-04-26 DIAGNOSIS — T82897A Other specified complication of cardiac prosthetic devices, implants and grafts, initial encounter: Secondary | ICD-10-CM | POA: Diagnosis not present

## 2014-04-26 DIAGNOSIS — T847XXA Infection and inflammatory reaction due to other internal orthopedic prosthetic devices, implants and grafts, initial encounter: Secondary | ICD-10-CM | POA: Diagnosis not present

## 2014-04-26 DIAGNOSIS — E119 Type 2 diabetes mellitus without complications: Secondary | ICD-10-CM | POA: Diagnosis not present

## 2014-04-26 DIAGNOSIS — M8610 Other acute osteomyelitis, unspecified site: Secondary | ICD-10-CM | POA: Diagnosis not present

## 2014-04-26 DIAGNOSIS — M869 Osteomyelitis, unspecified: Secondary | ICD-10-CM | POA: Diagnosis not present

## 2014-04-26 DIAGNOSIS — L02419 Cutaneous abscess of limb, unspecified: Secondary | ICD-10-CM | POA: Diagnosis not present

## 2014-04-26 DIAGNOSIS — M009 Pyogenic arthritis, unspecified: Secondary | ICD-10-CM | POA: Diagnosis not present

## 2014-04-26 DIAGNOSIS — L02619 Cutaneous abscess of unspecified foot: Secondary | ICD-10-CM | POA: Diagnosis not present

## 2014-04-26 DIAGNOSIS — R05 Cough: Secondary | ICD-10-CM | POA: Diagnosis not present

## 2014-04-26 DIAGNOSIS — E1169 Type 2 diabetes mellitus with other specified complication: Secondary | ICD-10-CM | POA: Diagnosis not present

## 2014-04-26 DIAGNOSIS — K219 Gastro-esophageal reflux disease without esophagitis: Secondary | ICD-10-CM | POA: Diagnosis not present

## 2014-04-26 DIAGNOSIS — Z472 Encounter for removal of internal fixation device: Secondary | ICD-10-CM | POA: Diagnosis not present

## 2014-04-26 DIAGNOSIS — Y849 Medical procedure, unspecified as the cause of abnormal reaction of the patient, or of later complication, without mention of misadventure at the time of the procedure: Secondary | ICD-10-CM | POA: Diagnosis not present

## 2014-04-26 HISTORY — PX: PERIPHERALLY INSERTED CENTRAL CATHETER INSERTION: SHX2221

## 2014-04-26 HISTORY — PX: I & D EXTREMITY: SHX5045

## 2014-04-26 HISTORY — PX: HARDWARE REMOVAL: SHX979

## 2014-04-26 HISTORY — PX: IRRIGATION AND DEBRIDEMENT ABSCESS: SHX5252

## 2014-04-26 LAB — GLUCOSE, CAPILLARY
GLUCOSE-CAPILLARY: 220 mg/dL — AB (ref 70–99)
GLUCOSE-CAPILLARY: 179 mg/dL — AB (ref 70–99)
Glucose-Capillary: 251 mg/dL — ABNORMAL HIGH (ref 70–99)
Glucose-Capillary: 165 mg/dL — ABNORMAL HIGH (ref 70–99)
Glucose-Capillary: 193 mg/dL — ABNORMAL HIGH (ref 70–99)

## 2014-04-26 LAB — BASIC METABOLIC PANEL
BUN: 17 mg/dL (ref 6–23)
CO2: 33 mEq/L — ABNORMAL HIGH (ref 19–32)
CREATININE: 0.84 mg/dL (ref 0.50–1.10)
Calcium: 9.7 mg/dL (ref 8.4–10.5)
Chloride: 98 mEq/L (ref 96–112)
GFR calc Af Amer: 79 mL/min — ABNORMAL LOW (ref >90)
GFR calc non Af Amer: 68 mL/min — ABNORMAL LOW (ref >90)
Glucose, Bld: 203 mg/dL — ABNORMAL HIGH (ref 70–99)
Potassium: 4.2 mEq/L (ref 3.7–5.3)
Sodium: 143 mEq/L (ref 137–147)

## 2014-04-26 LAB — CBC
HCT: 34 % — ABNORMAL LOW (ref 36.0–46.0)
Hemoglobin: 11 g/dL — ABNORMAL LOW (ref 12.0–15.0)
MCH: 30.6 pg (ref 26.0–34.0)
MCHC: 32.4 g/dL (ref 30.0–36.0)
MCV: 94.4 fL (ref 78.0–100.0)
PLATELETS: 340 10*3/uL (ref 150–400)
RBC: 3.6 MIL/uL — AB (ref 3.87–5.11)
RDW: 15.3 % (ref 11.5–15.5)
WBC: 9.2 10*3/uL (ref 4.0–10.5)

## 2014-04-26 LAB — URINE CULTURE
Colony Count: NO GROWTH
Culture: NO GROWTH

## 2014-04-26 LAB — SURGICAL PCR SCREEN
MRSA, PCR: NEGATIVE
Staphylococcus aureus: NEGATIVE

## 2014-04-26 SURGERY — REMOVAL, HARDWARE
Anesthesia: General | Site: Ankle | Laterality: Left

## 2014-04-26 MED ORDER — ONDANSETRON HCL 4 MG/2ML IJ SOLN: 4.0000 mg | Freq: Four times a day (QID) | INTRAMUSCULAR | Status: DC | PRN

## 2014-04-26 MED ORDER — FENTANYL CITRATE 0.05 MG/ML IJ SOLN
INTRAMUSCULAR | Status: DC | PRN
  Administered 2014-04-26: 25 ug via INTRAVENOUS
  Administered 2014-04-26 (×2): 50 ug via INTRAVENOUS
  Administered 2014-04-26: 25 ug via INTRAVENOUS
  Administered 2014-04-26: 50 ug via INTRAVENOUS

## 2014-04-26 MED ORDER — LACTATED RINGERS IV SOLN
INTRAVENOUS | Status: DC | PRN
  Administered 2014-04-26: 12:00:00 via INTRAVENOUS

## 2014-04-26 MED ORDER — ONDANSETRON HCL 4 MG/2ML IJ SOLN
INTRAMUSCULAR | Status: DC | PRN
  Administered 2014-04-26: 4 mg via INTRAVENOUS

## 2014-04-26 MED ORDER — DOCUSATE SODIUM 100 MG PO CAPS: 100.0000 mg | ORAL_CAPSULE | Freq: Two times a day (BID) | ORAL | Status: DC

## 2014-04-26 MED ORDER — SODIUM CHLORIDE 0.9 % IR SOLN
Status: DC | PRN
  Administered 2014-04-26: 1000 mL

## 2014-04-26 MED ORDER — PHENYLEPHRINE HCL 10 MG/ML IJ SOLN
INTRAMUSCULAR | Status: DC | PRN
  Administered 2014-04-26 (×2): 80 ug via INTRAVENOUS
  Administered 2014-04-26: 40 ug via INTRAVENOUS
  Administered 2014-04-26 (×2): 80 ug via INTRAVENOUS

## 2014-04-26 MED ORDER — INSULIN GLARGINE 100 UNIT/ML ~~LOC~~ SOLN
15.0000 [IU] | Freq: Every day | SUBCUTANEOUS | Status: DC
  Administered 2014-04-26 – 2014-04-27 (×2): 15 [IU] via SUBCUTANEOUS
  Filled 2014-04-26 (×3): qty 0.15

## 2014-04-26 MED ORDER — PROPOFOL 10 MG/ML IV BOLUS
INTRAVENOUS | Status: DC | PRN
  Administered 2014-04-26: 180 mg via INTRAVENOUS

## 2014-04-26 MED ORDER — LACTATED RINGERS IV SOLN
INTRAVENOUS | Status: DC
  Administered 2014-04-26: 12:00:00 via INTRAVENOUS

## 2014-04-26 MED ORDER — ONDANSETRON HCL 4 MG PO TABS: 4.0000 mg | ORAL_TABLET | Freq: Four times a day (QID) | ORAL | Status: DC | PRN

## 2014-04-26 MED ORDER — MIDAZOLAM HCL 5 MG/5ML IJ SOLN
INTRAMUSCULAR | Status: DC | PRN
  Administered 2014-04-26: 2 mg via INTRAVENOUS

## 2014-04-26 MED ORDER — ONDANSETRON HCL 4 MG/2ML IJ SOLN
INTRAMUSCULAR | Status: AC
  Filled 2014-04-26: qty 2

## 2014-04-26 MED ORDER — SODIUM CHLORIDE 0.9 % IJ SOLN
10.0000 mL | INTRAMUSCULAR | Status: DC | PRN
  Administered 2014-04-28 – 2014-05-02 (×4): 10 mL

## 2014-04-26 MED ORDER — MORPHINE SULFATE 2 MG/ML IJ SOLN
1.0000 mg | INTRAMUSCULAR | Status: DC | PRN
  Administered 2014-04-26 – 2014-04-30 (×3): 1 mg via INTRAVENOUS
  Filled 2014-04-26 (×4): qty 1

## 2014-04-26 MED ORDER — HYDROMORPHONE HCL PF 1 MG/ML IJ SOLN: 0.2500 mg | INTRAMUSCULAR | Status: DC | PRN

## 2014-04-26 MED ORDER — HYDRALAZINE HCL 20 MG/ML IJ SOLN
10.0000 mg | Freq: Four times a day (QID) | INTRAMUSCULAR | Status: DC | PRN
  Filled 2014-04-26: qty 1

## 2014-04-26 MED ORDER — BACITRACIN ZINC 500 UNIT/GM EX OINT
TOPICAL_OINTMENT | CUTANEOUS | Status: AC
  Filled 2014-04-26: qty 15

## 2014-04-26 MED ORDER — ONDANSETRON HCL 4 MG/2ML IJ SOLN: 4.0000 mg | Freq: Once | INTRAMUSCULAR | Status: DC | PRN

## 2014-04-26 MED ORDER — OXYCODONE HCL 5 MG PO TABS
5.0000 mg | ORAL_TABLET | ORAL | Status: DC | PRN
  Administered 2014-04-26 – 2014-05-01 (×5): 5 mg via ORAL
  Filled 2014-04-26 (×8): qty 1

## 2014-04-26 MED ORDER — FENTANYL CITRATE 0.05 MG/ML IJ SOLN
INTRAMUSCULAR | Status: AC
  Filled 2014-04-26: qty 5

## 2014-04-26 MED ORDER — LIDOCAINE HCL (CARDIAC) 20 MG/ML IV SOLN
INTRAVENOUS | Status: DC | PRN
  Administered 2014-04-26: 60 mg via INTRAVENOUS

## 2014-04-26 MED ORDER — BACITRACIN ZINC 500 UNIT/GM EX OINT
TOPICAL_OINTMENT | CUTANEOUS | Status: DC | PRN
  Administered 2014-04-26: 1 via TOPICAL

## 2014-04-26 MED ORDER — PROPOFOL 10 MG/ML IV BOLUS
INTRAVENOUS | Status: AC
  Filled 2014-04-26: qty 20

## 2014-04-26 MED ORDER — SODIUM CHLORIDE 0.9 % IV SOLN: INTRAVENOUS | Status: DC

## 2014-04-26 MED ORDER — METOPROLOL TARTRATE 1 MG/ML IV SOLN
5.0000 mg | INTRAVENOUS | Status: DC | PRN
Start: 2014-04-26 — End: 2014-05-02

## 2014-04-26 MED ORDER — SENNA 8.6 MG PO TABS
1.0000 | ORAL_TABLET | Freq: Two times a day (BID) | ORAL | Status: DC
  Administered 2014-04-26 – 2014-05-01 (×9): 8.6 mg via ORAL
  Filled 2014-04-26 (×16): qty 1

## 2014-04-26 MED ORDER — MIDAZOLAM HCL 2 MG/2ML IJ SOLN
INTRAMUSCULAR | Status: AC
  Filled 2014-04-26: qty 2

## 2014-04-26 MED ORDER — SODIUM CHLORIDE 0.9 % IJ SOLN
10.0000 mL | Freq: Two times a day (BID) | INTRAMUSCULAR | Status: DC
  Administered 2014-04-27: 3 mL
  Administered 2014-04-28 – 2014-04-29 (×3): 10 mL

## 2014-04-26 MED ORDER — SODIUM CHLORIDE 0.9 % IR SOLN
Status: DC | PRN
  Administered 2014-04-26 (×2): 3000 mL

## 2014-04-26 MED ORDER — HEPARIN SODIUM (PORCINE) 5000 UNIT/ML IJ SOLN
5000.0000 [IU] | Freq: Three times a day (TID) | INTRAMUSCULAR | Status: DC
  Administered 2014-04-26 – 2014-05-02 (×17): 5000 [IU] via SUBCUTANEOUS
  Filled 2014-04-26 (×24): qty 1

## 2014-04-26 SURGICAL SUPPLY — 62 items
BAG SNAP BAND KOVER 36X36 (MISCELLANEOUS) ×2 IMPLANT
BANDAGE CONFORM 3  STR LF (GAUZE/BANDAGES/DRESSINGS) ×1 IMPLANT
BANDAGE ELASTIC 4 VELCRO ST LF (GAUZE/BANDAGES/DRESSINGS) ×3 IMPLANT
BANDAGE ESMARK 6X9 LF (GAUZE/BANDAGES/DRESSINGS) ×1 IMPLANT
BANDAGE GAUZE ELAST BULKY 4 IN (GAUZE/BANDAGES/DRESSINGS) ×2 IMPLANT
BLADE SURG 10 STRL SS (BLADE) ×3 IMPLANT
BNDG CMPR 9X6 STRL LF SNTH (GAUZE/BANDAGES/DRESSINGS) ×1
BNDG COHESIVE 4X5 TAN STRL (GAUZE/BANDAGES/DRESSINGS) ×1 IMPLANT
BNDG COHESIVE 6X5 TAN STRL LF (GAUZE/BANDAGES/DRESSINGS) ×1 IMPLANT
BNDG ESMARK 6X9 LF (GAUZE/BANDAGES/DRESSINGS) ×3
CANISTER SUCT 3000ML (MISCELLANEOUS) ×3 IMPLANT
CHLORAPREP W/TINT 26ML (MISCELLANEOUS) ×3 IMPLANT
COVER SURGICAL LIGHT HANDLE (MISCELLANEOUS) ×3 IMPLANT
CUFF TOURNIQUET SINGLE 34IN LL (TOURNIQUET CUFF) ×3 IMPLANT
CUFF TOURNIQUET SINGLE 44IN (TOURNIQUET CUFF) IMPLANT
DRAIN PENROSE 1/2X12 LTX STRL (WOUND CARE) IMPLANT
DRAPE C-ARM 42X72 X-RAY (DRAPES) ×1 IMPLANT
DRAPE C-ARM MINI 42X72 WSTRAPS (DRAPES) ×2 IMPLANT
DRAPE U-SHAPE 47X51 STRL (DRAPES) ×3 IMPLANT
DRSG ADAPTIC 3X8 NADH LF (GAUZE/BANDAGES/DRESSINGS) ×3 IMPLANT
DRSG PAD ABDOMINAL 8X10 ST (GAUZE/BANDAGES/DRESSINGS) ×4 IMPLANT
ELECT REM PT RETURN 9FT ADLT (ELECTROSURGICAL) ×3
ELECTRODE REM PT RTRN 9FT ADLT (ELECTROSURGICAL) ×1 IMPLANT
GLOVE BIO SURGEON STRL SZ7 (GLOVE) ×3 IMPLANT
GLOVE BIO SURGEON STRL SZ8 (GLOVE) ×3 IMPLANT
GLOVE BIOGEL PI IND STRL 7.5 (GLOVE) ×1 IMPLANT
GLOVE BIOGEL PI IND STRL 8 (GLOVE) ×1 IMPLANT
GLOVE BIOGEL PI INDICATOR 7.5 (GLOVE) ×2
GLOVE BIOGEL PI INDICATOR 8 (GLOVE) ×2
GOWN STRL REUS W/ TWL LRG LVL3 (GOWN DISPOSABLE) ×2 IMPLANT
GOWN STRL REUS W/ TWL XL LVL3 (GOWN DISPOSABLE) ×1 IMPLANT
GOWN STRL REUS W/TWL LRG LVL3 (GOWN DISPOSABLE) ×6
GOWN STRL REUS W/TWL XL LVL3 (GOWN DISPOSABLE) ×3
KIT BASIN OR (CUSTOM PROCEDURE TRAY) ×3 IMPLANT
KIT ROOM TURNOVER OR (KITS) ×3 IMPLANT
NEEDLE 22X1 1/2 (OR ONLY) (NEEDLE) IMPLANT
NS IRRIG 1000ML POUR BTL (IV SOLUTION) ×3 IMPLANT
PACK ORTHO EXTREMITY (CUSTOM PROCEDURE TRAY) ×3 IMPLANT
PAD ABD 8X10 STRL (GAUZE/BANDAGES/DRESSINGS) ×2 IMPLANT
PAD ARMBOARD 7.5X6 YLW CONV (MISCELLANEOUS) ×6 IMPLANT
PAD CAST 4YDX4 CTTN HI CHSV (CAST SUPPLIES) ×1 IMPLANT
PADDING CAST COTTON 4X4 STRL (CAST SUPPLIES) ×3
SOAP 2 % CHG 4 OZ (WOUND CARE) ×3 IMPLANT
SPONGE GAUZE 4X4 12PLY (GAUZE/BANDAGES/DRESSINGS) ×2 IMPLANT
SPONGE LAP 4X18 X RAY DECT (DISPOSABLE) ×2 IMPLANT
STAPLER VISISTAT 35W (STAPLE) IMPLANT
SUCTION FRAZIER TIP 10 FR DISP (SUCTIONS) ×3 IMPLANT
SUT ETHILON 2 0 PSLX (SUTURE) ×4 IMPLANT
SUT ETHILON 3 0 FSL (SUTURE) ×4 IMPLANT
SUT PROLENE 3 0 PS 2 (SUTURE) ×3 IMPLANT
SUT VIC AB 2-0 CT1 27 (SUTURE) ×6
SUT VIC AB 2-0 CT1 TAPERPNT 27 (SUTURE) ×2 IMPLANT
SUT VIC AB 3-0 PS2 18 (SUTURE) ×3
SUT VIC AB 3-0 PS2 18XBRD (SUTURE) ×1 IMPLANT
SYR CONTROL 10ML LL (SYRINGE) IMPLANT
TOWEL OR 17X24 6PK STRL BLUE (TOWEL DISPOSABLE) ×3 IMPLANT
TOWEL OR 17X26 10 PK STRL BLUE (TOWEL DISPOSABLE) ×3 IMPLANT
TUBE CONNECTING 12'X1/4 (SUCTIONS) ×1
TUBE CONNECTING 12X1/4 (SUCTIONS) ×2 IMPLANT
TUBING CYSTO DISP (UROLOGICAL SUPPLIES) ×5 IMPLANT
WATER STERILE IRR 1000ML POUR (IV SOLUTION) ×3 IMPLANT
YANKAUER SUCT BULB TIP NO VENT (SUCTIONS) ×3 IMPLANT

## 2014-04-26 NOTE — Progress Notes (Signed)
Peripherally Inserted Central Catheter/Midline Placement  The IV Nurse has discussed with the patient and/or persons authorized to consent for the patient, the purpose of this procedure and the potential benefits and risks involved with this procedure.  The benefits include less needle sticks, lab draws from the catheter and patient may be discharged home with the catheter.  Risks include, but not limited to, infection, bleeding, blood clot (thrombus formation), and puncture of an artery; nerve damage and irregular heat beat.  Alternatives to this procedure were also discussed.  PICC/Midline Placement Documentation  PICC / Midline Single Lumen 11/55/20 PICC Right Basilic 38 cm (Active)  Indication for Insertion or Continuance of Line Administration of hyperosmolar/irritating solutions (i.e. TPN, Vancomycin, etc.) 04/26/2014  8:05 PM  Exposed Catheter (cm) 0 cm 04/26/2014  8:05 PM  Line Status Flushed;Saline locked;Blood return noted 04/26/2014  8:05 PM  Dressing Change Due 05/03/14 04/26/2014  8:05 PM       Gordan Payment 04/26/2014, 8:17 PM

## 2014-04-26 NOTE — Progress Notes (Signed)
Orthopedic Tech Progress Note Patient Details:  Kathryn Ryan September 20, 1941 [id number]  Ortho Devices Type of Ortho Device: Postop shoe/boot Ortho Device/Splint Location: lle Ortho Device/Splint Interventions: Application   Delfin Squillace 04/26/2014, 2:20 PM

## 2014-04-26 NOTE — Anesthesia Preprocedure Evaluation (Addendum)
Anesthesia Evaluation  Patient identified by MRN, date of birth, ID band Patient awake    Reviewed: Allergy & Precautions, H&P , NPO status , Patient's Chart, lab work & pertinent test results  Airway Mallampati: II TM Distance: >3 FB     Dental  (+) Edentulous Upper   Pulmonary former smoker,  breath sounds clear to auscultation        Cardiovascular hypertension, Rhythm:Regular Rate:Normal     Neuro/Psych    GI/Hepatic hiatal hernia, GERD-  Medicated and Controlled,  Endo/Other  Hypothyroidism   Renal/GU      Musculoskeletal   Abdominal (+) + obese,   Peds  Hematology   Anesthesia Other Findings   Reproductive/Obstetrics                        Anesthesia Physical Anesthesia Plan  ASA: III  Anesthesia Plan: General   Post-op Pain Management:    Induction: Intravenous  Airway Management Planned: LMA  Additional Equipment:   Intra-op Plan:   Post-operative Plan: Extubation in OR  Informed Consent:   Dental advisory given  Plan Discussed with: CRNA, Anesthesiologist and Surgeon  Anesthesia Plan Comments: (Infected L ankle with hardware Hypertension Type 2 DM glucose 203 Obesity  Plan GA with LMA  Roberts Gaudy, MD )      Anesthesia Quick Evaluation

## 2014-04-26 NOTE — Anesthesia Postprocedure Evaluation (Signed)
  Anesthesia Post-op Note  Patient: Kathryn Ryan  Procedure(s) Performed: Procedure(s): HARDWARE REMOVAL LEFT ANKLE (Left) IRRIGATION AND DEBRIDEMENT LEFT ANKLE (Left)  Patient Location: PACU  Anesthesia Type:General  Level of Consciousness: awake, alert  and oriented  Airway and Oxygen Therapy: Patient Spontanous Breathing  Post-op Pain: mild  Post-op Assessment: Post-op Vital signs reviewed, Patient's Cardiovascular Status Stable, Respiratory Function Stable, Patent Airway and Pain level controlled  Post-op Vital Signs: stable  Last Vitals:  Filed Vitals:   04/26/14 1455  BP: 113/58  Pulse: 90  Temp: 36.8 C  Resp: 18    Complications: No apparent anesthesia complications

## 2014-04-26 NOTE — Consult Note (Signed)
Reason for Consult:  Left ankle pain Referring Physician:  Dr. Pryor Kathryn Ryan is an 73 y.o. female.  HPI:  73 y/o female with PMH of diabetes was admitted yesterday with suspicion of sepsis.  She saw me last week in clinic as a second opinion for left foot pain.  She underwent talonavicular and calcaneocuboid arthrodesis last year by a surgeon in [place], [place].  She began having drainage from a medial foot wound recently.  She was scheduled for hardware removal and biopsy off of abx yesterday.  At the surgery center she c/o feeling poorly for several days as well as increased swelling about the left ankle.  She underwent u/s aspiration of the ankle yesterday and is on vanc and zosyn now.  She reports that she feels much better than yesterday.  She c/o aching pain in the ankle that is worse with wb and better with elevation and rest.  Past Medical History  Diagnosis Date  . Hypertension   . Hypothyroidism   . GERD (gastroesophageal reflux disease)   . H/O hiatal hernia   . Neuropathy     non related to DM-not sure why  . Neuropathy     non DM related  . Anxiety   . Spinal stenosis of lumbar region   . Lumbar radiculopathy     Past Surgical History  Procedure Laterality Date  . Back surgery    . Abdominal hysterectomy    . Bunionectomy      bilateral  . Cholecystectomy    . Breast surgery      bilateral lumpectomies  . Left oophorectomy    . Tonsillectomy      6 yrs    No family history on file.  Social History:  reports that she quit smoking about 19 years ago. Her smoking use included Cigarettes. She has a 50 pack-year smoking history. She has never used smokeless tobacco. She reports that she does not drink alcohol or use illicit drugs.  Allergies:  Allergies  Allergen Reactions  . Statins Other (See Comments)    Joint pain   . Sulfa Antibiotics Itching    Medications: I have reviewed the patient's current medications.  Results for orders placed during the  hospital encounter of 04/25/14 (from the past 48 hour(s))  CBC WITH DIFFERENTIAL     Status: Abnormal   Collection Time    04/25/14 12:29 PM      Result Value Ref Range   WBC 9.6  4.0 - 10.5 K/uL   RBC 3.38 (*) 3.87 - 5.11 MIL/uL   Hemoglobin 10.2 (*) 12.0 - 15.0 g/dL   HCT 30.9 (*) 36.0 - 46.0 %   MCV 91.4  78.0 - 100.0 fL   MCH 30.2  26.0 - 34.0 pg   MCHC 33.0  30.0 - 36.0 g/dL   RDW 15.2  11.5 - 15.5 %   Platelets 315  150 - 400 K/uL   Neutrophils Relative % 80 (*) 43 - 77 %   Neutro Abs 7.6  1.7 - 7.7 K/uL   Lymphocytes Relative 12  12 - 46 %   Lymphs Abs 1.2  0.7 - 4.0 K/uL   Monocytes Relative 8  3 - 12 %   Monocytes Absolute 0.8  0.1 - 1.0 K/uL   Eosinophils Relative 0  0 - 5 %   Eosinophils Absolute 0.0  0.0 - 0.7 K/uL   Basophils Relative 0  0 - 1 %   Basophils Absolute 0.0  0.0 - 0.1 K/uL  COMPREHENSIVE METABOLIC PANEL     Status: Abnormal   Collection Time    04/25/14 12:29 PM      Result Value Ref Range   Sodium 134 (*) 137 - 147 mEq/L   Potassium 3.5 (*) 3.7 - 5.3 mEq/L   Chloride 92 (*) 96 - 112 mEq/L   CO2 30  19 - 32 mEq/L   Glucose, Bld 238 (*) 70 - 99 mg/dL   BUN 16  6 - 23 mg/dL   Creatinine, Ser 0.82  0.50 - 1.10 mg/dL   Calcium 9.1  8.4 - 10.5 mg/dL   Total Protein 7.0  6.0 - 8.3 g/dL   Albumin 2.4 (*) 3.5 - 5.2 g/dL   AST 15  0 - 37 U/L   ALT 12  0 - 35 U/L   Alkaline Phosphatase 142 (*) 39 - 117 U/L   Total Bilirubin 0.3  0.3 - 1.2 mg/dL   GFR calc non Af Amer 70 (*) >90 mL/min   GFR calc Af Amer 81 (*) >90 mL/min   Comment: (NOTE)     The eGFR has been calculated using the CKD EPI equation.     This calculation has not been validated in all clinical situations.     eGFR's persistently <90 mL/min signify possible Chronic Kidney     Disease.  HEMOGLOBIN A1C     Status: Abnormal   Collection Time    04/25/14 12:29 PM      Result Value Ref Range   Hemoglobin A1C 7.0 (*) <5.7 %   Comment: (NOTE)                                                                                According to the ADA Clinical Practice Recommendations for 2011, when     HbA1c is used as a screening test:      >=6.5%   Diagnostic of Diabetes Mellitus               (if abnormal result is confirmed)     5.7-6.4%   Increased risk of developing Diabetes Mellitus     References:Diagnosis and Classification of Diabetes Mellitus,Diabetes     BOFB,5102,58(NIDPO 1):S62-S69 and Standards of Medical Care in             Diabetes - 2011,Diabetes Care,2011,34 (Suppl 1):S11-S61.   Mean Plasma Glucose 154 (*) <117 mg/dL   Comment: Performed at [facility], ROUTINE W REFLEX MICROSCOPIC     Status: Abnormal   Collection Time    04/25/14 12:55 PM      Result Value Ref Range   Color, Urine YELLOW  YELLOW   APPearance HAZY (*) CLEAR   Specific Gravity, Urine 1.020  1.005 - 1.030   pH 5.5  5.0 - 8.0   Glucose, UA NEGATIVE  NEGATIVE mg/dL   Hgb urine dipstick NEGATIVE  NEGATIVE   Bilirubin Urine NEGATIVE  NEGATIVE   Ketones, ur NEGATIVE  NEGATIVE mg/dL   Protein, ur 30 (*) NEGATIVE mg/dL   Urobilinogen, UA 1.0  0.0 - 1.0 mg/dL   Nitrite NEGATIVE  NEGATIVE   Leukocytes, UA NEGATIVE  NEGATIVE  URINE MICROSCOPIC-ADD ON     Status: Abnormal   Collection Time    04/25/14 12:55 PM      Result Value Ref Range   Squamous Epithelial / LPF MANY (*) RARE   WBC, UA 7-10  <3 WBC/hpf   RBC / HPF 0-2  <3 RBC/hpf   Bacteria, UA MANY (*) RARE   Urine-Other MUCOUS PRESENT    LACTIC ACID, PLASMA     Status: None   Collection Time    04/25/14  1:00 PM      Result Value Ref Range   Lactic Acid, Venous 1.3  0.5 - 2.2 mmol/L  GLUCOSE, CAPILLARY     Status: Abnormal   Collection Time    04/25/14  5:45 PM      Result Value Ref Range   Glucose-Capillary 199 (*) 70 - 99 mg/dL  GLUCOSE, CAPILLARY     Status: Abnormal   Collection Time    04/25/14  9:42 PM      Result Value Ref Range   Glucose-Capillary 231 (*) 70 - 99 mg/dL   Comment 1 Notify RN    CBC      Status: Abnormal   Collection Time    04/26/14  5:25 AM      Result Value Ref Range   WBC 9.2  4.0 - 10.5 K/uL   RBC 3.60 (*) 3.87 - 5.11 MIL/uL   Hemoglobin 11.0 (*) 12.0 - 15.0 g/dL   HCT 34.0 (*) 36.0 - 46.0 %   MCV 94.4  78.0 - 100.0 fL   MCH 30.6  26.0 - 34.0 pg   MCHC 32.4  30.0 - 36.0 g/dL   RDW 15.3  11.5 - 15.5 %   Platelets 340  150 - 400 K/uL  BASIC METABOLIC PANEL     Status: Abnormal   Collection Time    04/26/14  5:25 AM      Result Value Ref Range   Sodium 143  137 - 147 mEq/L   Potassium 4.2  3.7 - 5.3 mEq/L   Chloride 98  96 - 112 mEq/L   CO2 33 (*) 19 - 32 mEq/L   Glucose, Bld 203 (*) 70 - 99 mg/dL   BUN 17  6 - 23 mg/dL   Creatinine, Ser 0.84  0.50 - 1.10 mg/dL   Calcium 9.7  8.4 - 10.5 mg/dL   GFR calc non Af Amer 68 (*) >90 mL/min   GFR calc Af Amer 79 (*) >90 mL/min   Comment: (NOTE)     The eGFR has been calculated using the CKD EPI equation.     This calculation has not been validated in all clinical situations.     eGFR's persistently <90 mL/min signify possible Chronic Kidney     Disease.    Dg Ankle Complete Left  04/25/2014   CLINICAL DATA:  Wound infection  EXAM: LEFT ANKLE COMPLETE - 3+ VIEW  COMPARISON:  None.  FINDINGS: Frontal, oblique, and lateral views were obtained. Two screws transfix with the hindfoot, including the anterior subtalar joint from a medial approach. Both screws have fractures in their respective mid portions.  There is marked soft tissue swelling throughout the ankle joint with foci of soft tissue air medially concerning for abscess. No acute fracture is seen. The ankle mortise appears intact. There is fluid in the joint. 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 There also surgical screws transfixing the calcaneocuboid joint region.  IMPRESSION: Marked soft tissue swelling with air medially. Suspect abscess  within the soft tissues surrounding the ankle and hindfoot. There is postoperative change with 2 screws transfixing the  anterior subtalar joint. There are fractures in the mid portions of each screw. There are also screws to the calcaneocuboid joint. The screws do not appear fractured. No acute fracture. Joint effusion which could potentially represent infection within the joint.  Given these findings, it may be prudent to consider contrast enhanced CT to further evaluate.  Critical Value/emergent results were called by telephone at the time of interpretation on 04/25/2014 at 1:33 PM to Dr. Francine Graven , who verbally acknowledged these results.   Electronically Signed   By: Lowella Grip M.D.   On: 04/25/2014 13:34   Mr Ankle Left W Wo Contrast  04/25/2014   CLINICAL DATA:  Left foot pain and swelling. History of prior surgery.  EXAM: MRI OF THE LEFT ANKLE WITHOUT AND WITH CONTRAST  TECHNIQUE: Multiplanar, multisequence MR imaging of the ankle was performed before and after the administration of intravenous contrast.  CONTRAST:  1 MULTIHANCE GADOBENATE DIMEGLUMINE 529 MG/ML IV SOLN  COMPARISON:  Radiographs 04/25/2014  FINDINGS: Examination is limited by patient motion and artifact from hardware.  Hardware is noted in the midfoot with fractured screws noted on the radiographs. There is diffuse soft tissue swelling/edema and multiple rim enhancing subcutaneous fluid collections suspicious for abscesses. The largest cyst along the lateral malleolus. There is also a large joint effusion involving the tibiotalar joint with thick rim enhancing synovium. Could not exclude septic arthritis. Recommend joint aspiration. It is difficult to evaluate for osteomyelitis because of the significant artifact. Severe talonavicular joint degenerative changes are noted with moderate subluxation. Rim enhancing joint effusion is noted here also.  IMPRESSION: IMPRESSION Medial and lateral soft tissue abscesses.  Joint effusions and marked synovial enhancement worrisome for septic arthritis. This mainly involves the tibiotalar joint and  talonavicular joint.  Limited examination for osteomyelitis due to artifact and patient motion.   Electronically Signed   By: Kalman Jewels M.D.   On: 04/25/2014 16:36   Dg Fluoro Guide Ndl Plc/bx  04/25/2014   CLINICAL DATA:  71-YEAR-OLD FEMALE WITH SUSPECTED SEPTIC LEFT ANKLE.  EXAM: LEFT ANKLE JOINT ASPIRATION  TECHNIQUE: Written informed consent was obtained from the patient following explanation of the risks, benefits and alternatives. The ankle was prepped and draped in the usual fashion using Betadine skin prep. Localization was performed under fluoroscopy. A 20 gauge spinal needle was advanced into the tibiotalar joint and a small volume of turbid bloody fluid was aspirated. An 18 gauge needle was also advanced into an area of fluctuance overlying the lateral malleolus. Approximately 1 cc of purulent fluid was successfully aspirated.  Hemostasis was attained by gentle manual pressure and Band-Aids were applied. The patient tolerated the procedure well. There was no immediate complication.  FLUOROSCOPY TIME:  37 seconds  COMPARISON:  Ankle MRI 04/25/2014  FINDINGS: Successful aspiration of a small amount of purulent/bloody fluid from the tibiotalar joint and fluid overlying the lateral malleolus.  IMPRESSION: Successful aspiration of a small amount of purulent/bloody fluid from the tibiotalar joint and fluid overlying the lateral malleolus.  Fluid sent for Gram stain and culture.   Electronically Signed   By: Jacqulynn Cadet M.D.   On: 04/25/2014 21:26    ROS:  As above PE:  Blood pressure 133/62, pulse 97, temperature 98.6 F (37 C), temperature source Oral, resp. rate 18, height _0  (1.676 m), weight 106.595 kg (235 lb), SpO2 93.00%. wn wd woman  in nad.  A and o x 4.  Mood and affect normal.  EOMI.  resp unlabored.  Gait is antalgic to the left.  Skin healthy except for small wound m3edially and the left foot.  Serous drainage at the wound.  There is an area of swelling medially at the ankle  that has some fluctuance.  5/5 strength in PF and DF fo the ankle.  sens to LT is intact at the foot.  Brisk cap refill at the toes.  No lylmphadenopathy.  Assessment/Plan: L ankle infection and foot abscess - to OR for I and D this afternoon.  NPO until then.  D/c heparin.  The risks and benefits of the alternative treatment options have been discussed in detail.  The patient wishes to proceed with surgery and specifically understands risks of bleeding, infection, nerve damage, blood clots, need for additional surgery, amputation and death.   Wylene Simmer May 25, 2014, 7:36 AM

## 2014-04-26 NOTE — Progress Notes (Signed)
Patient Demographics  Kathryn Ryan, is a 73 y.o. female, DOB - Jan 09, 1941, [id number]  Admit date - 04/25/2014   Admitting Physician Verlee Monte, MD  Outpatient Primary MD for the patient is Gar Ponto, MD  LOS - 1   Chief Complaint  Patient presents with  . Wound Check        Assessment & Plan    Infected hardware with abscess in the left ankle   Being followed by Ortho and ID, no antibiotics till operative wound cultures operated or infectious disease.  Will request also to kindly order Vancocin Zosyn once cultures operated during surgery. Thereafter antibiotic course and duration per infectious disease. May need PICC line and nursing home placement.    Get baseline EKG and chest x-ray.     UTI?   On it her urine cultures.    Hyperglycemia  Patient has no documented history of DM. Newly diagnosed type 2 diabetes mellitus this admission. On SSI will add Lantus for better control.    Lab Results  Component Value Date   HGBA1C 7.0* 04/25/2014    CBG (last 3)   Recent Labs  04/25/14 1745 04/25/14 2142 04/26/14 0742  GLUCAP 199* 231* 220*        Hypothyroid  Will continue Synthroid     HTN  Holding HCTZ.  Will continue Lisinopril.  Continue     Neuropathy  Has a history of claudication with 2 spinal surgeries  Continue Lyrica   .  GERD  Continue PPI   Code Status: Full  Family Communication: none present  Disposition Plan: TBD   Procedures due for OR in the afternoon on 04/26/2014 by Dr. Starla Link, ID   Medications  Scheduled Meds: . sodium chloride   Intravenous STAT  . docusate sodium  200 mg Oral QHS  . HYDROcodone-acetaminophen  2 tablet Oral QHS  . insulin aspart  0-9 Units Subcutaneous TID WC  .  levothyroxine  100 mcg Oral QAC breakfast  . lisinopril  10 mg Oral Daily  . pantoprazole  40 mg Oral Daily  . piperacillin-tazobactam (ZOSYN)  IV  3.375 g Intravenous 3 times per day  . pregabalin  150 mg Oral BID  . vancomycin  1,000 mg Intravenous Q12H   Continuous Infusions:  PRN Meds:.acetaminophen, acetaminophen, alum & mag hydroxide-simeth, morphine injection, ondansetron (ZOFRAN) IV, ondansetron  DVT Prophylaxis will order heparin from 9 PM on 04/26/2014  Lab Results  Component Value Date   PLT 340 04/26/2014    Antibiotics     Anti-infectives   Start     Dose/Rate Route Frequency Ordered Stop   04/26/14 1000  vancomycin (VANCOCIN) IVPB 1000 mg/200 mL premix     1,000 mg 200 mL/hr over 60 Minutes Intravenous Every 12 hours 04/25/14 2028     04/25/14 2200  piperacillin-tazobactam (ZOSYN) IVPB 3.375 g     3.375 g 12.5 mL/hr over 240 Minutes Intravenous 3 times per day 04/25/14 2028     04/25/14 2100  vancomycin (VANCOCIN) 2,000 mg in sodium chloride 0.9 % 500 mL IVPB     2,000 mg 250 mL/hr over 120 Minutes Intravenous NOW 04/25/14 2028 04/25/14 2322   04/25/14 1400  vancomycin (VANCOCIN) 2,000 mg in  sodium chloride 0.9 % 500 mL IVPB  Status:  Discontinued     2,000 mg 250 mL/hr over 120 Minutes Intravenous  Once 04/25/14 1356 04/25/14 1515   04/25/14 1400  piperacillin-tazobactam (ZOSYN) IVPB 3.375 g     3.375 g 100 mL/hr over 30 Minutes Intravenous  Once 04/25/14 1356 04/25/14 1624          Subjective:   Kathryn Ryan today has, No headache, No chest pain, No abdominal pain - No Nausea, No new weakness tingling or numbness, No Cough - SOB. Some pain in the left ankle.  Objective:   Filed Vitals:   04/25/14 1730 04/25/14 2120 04/25/14 2150 04/26/14 0531  BP: 122/59 122/59 145/63 133/62  Pulse: 99  102 97  Temp: 99.7 F (37.6 C)  98.4 F (36.9 C) 98.6 F (37 C)  TempSrc: Oral  Oral Oral  Resp: 19  18 18   Height: 5\' 6"  (1.676 m)     Weight: 106.595 kg  (235 lb)     SpO2: 96%  92% 93%    Wt Readings from Last 3 Encounters:  04/25/14 106.595 kg (235 lb)  04/25/14 106.595 kg (235 lb)  02/27/12 107.1 kg (236 lb 1.8 oz)     Intake/Output Summary (Last 24 hours) at 04/26/14 0859 Last data filed at 04/25/14 2029  Gross per 24 hour  Intake      0 ml  Output    150 ml  Net   -150 ml     Physical Exam  Awake Alert, Oriented X 3, No new F.N deficits, Normal affect [place].AT,PERRAL Supple Neck,No JVD, No cervical lymphadenopathy appriciated.  Symmetrical Chest wall movement, Good air movement bilaterally, CTAB RRR,No Gallops,Rubs or new Murmurs, No Parasternal Heave +ve B.Sounds, Abd Soft, Non tender, No organomegaly appriciated, No rebound - guarding or rigidity. No Cyanosis, Clubbing or edema, No new Rash or bruise,  left ankle swollen and red   Data Review   Micro Results Recent Results (from the past 240 hour(s))  BODY FLUID CULTURE     Status: None   Collection Time    04/25/14  8:03 PM      Result Value Ref Range Status   Specimen Description SYNOVIAL FLUID ANKLE LEFT   Final   Special Requests Normal   Final   Gram Stain     Final   Value: FEW WBC PRESENT,BOTH PMN AND MONONUCLEAR     NO ORGANISMS SEEN     Performed at [facility]   Culture     Final   Value: NO GROWTH     Performed at [facility]   Report Status PENDING   Incomplete    Radiology Reports Dg Ankle Complete Left  04/25/2014   CLINICAL DATA:  Wound infection  EXAM: LEFT ANKLE COMPLETE - 3+ VIEW  COMPARISON:  None.  FINDINGS: Frontal, oblique, and lateral views were obtained. Two screws transfix with the hindfoot, including the anterior subtalar joint from a medial approach. Both screws have fractures in their respective mid portions.  There is marked soft tissue swelling throughout the ankle joint with foci of soft tissue air medially concerning for abscess. No acute fracture is seen. The ankle mortise appears intact. There is fluid in the  joint. 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 There also surgical screws transfixing the calcaneocuboid joint region.  IMPRESSION: Marked soft tissue swelling with air medially. Suspect abscess within the soft tissues surrounding the ankle and hindfoot. There is postoperative change with 2 screws  transfixing the anterior subtalar joint. There are fractures in the mid portions of each screw. There are also screws to the calcaneocuboid joint. The screws do not appear fractured. No acute fracture. Joint effusion which could potentially represent infection within the joint.  Given these findings, it may be prudent to consider contrast enhanced CT to further evaluate.  Critical Value/emergent results were called by telephone at the time of interpretation on 04/25/2014 at 1:33 PM to Dr. Francine Graven , who verbally acknowledged these results.   Electronically Signed   By: Lowella Grip M.D.   On: 04/25/2014 13:34   Mr Ankle Left W Wo Contrast  04/25/2014   CLINICAL DATA:  Left foot pain and swelling. History of prior surgery.  EXAM: MRI OF THE LEFT ANKLE WITHOUT AND WITH CONTRAST  TECHNIQUE: Multiplanar, multisequence MR imaging of the ankle was performed before and after the administration of intravenous contrast.  CONTRAST:  1 MULTIHANCE GADOBENATE DIMEGLUMINE 529 MG/ML IV SOLN  COMPARISON:  Radiographs 04/25/2014  FINDINGS: Examination is limited by patient motion and artifact from hardware.  Hardware is noted in the midfoot with fractured screws noted on the radiographs. There is diffuse soft tissue swelling/edema and multiple rim enhancing subcutaneous fluid collections suspicious for abscesses. The largest cyst along the lateral malleolus. There is also a large joint effusion involving the tibiotalar joint with thick rim enhancing synovium. Could not exclude septic arthritis. Recommend joint aspiration. It is difficult to evaluate for osteomyelitis because of the significant artifact. Severe  talonavicular joint degenerative changes are noted with moderate subluxation. Rim enhancing joint effusion is noted here also.  IMPRESSION: IMPRESSION Medial and lateral soft tissue abscesses.  Joint effusions and marked synovial enhancement worrisome for septic arthritis. This mainly involves the tibiotalar joint and talonavicular joint.  Limited examination for osteomyelitis due to artifact and patient motion.   Electronically Signed   By: Kalman Jewels M.D.   On: 04/25/2014 16:36   Dg Fluoro Guide Ndl Plc/bx  04/25/2014   CLINICAL DATA:  63-YEAR-OLD FEMALE WITH SUSPECTED SEPTIC LEFT ANKLE.  EXAM: LEFT ANKLE JOINT ASPIRATION  TECHNIQUE: Written informed consent was obtained from the patient following explanation of the risks, benefits and alternatives. The ankle was prepped and draped in the usual fashion using Betadine skin prep. Localization was performed under fluoroscopy. A 20 gauge spinal needle was advanced into the tibiotalar joint and a small volume of turbid bloody fluid was aspirated. An 18 gauge needle was also advanced into an area of fluctuance overlying the lateral malleolus. Approximately 1 cc of purulent fluid was successfully aspirated.  Hemostasis was attained by gentle manual pressure and Band-Aids were applied. The patient tolerated the procedure well. There was no immediate complication.  FLUOROSCOPY TIME:  37 seconds  COMPARISON:  Ankle MRI 04/25/2014  FINDINGS: Successful aspiration of a small amount of purulent/bloody fluid from the tibiotalar joint and fluid overlying the lateral malleolus.  IMPRESSION: Successful aspiration of a small amount of purulent/bloody fluid from the tibiotalar joint and fluid overlying the lateral malleolus.  Fluid sent for Gram stain and culture.   Electronically Signed   By: Jacqulynn Cadet M.D.   On: 04/25/2014 21:26    CBC  Recent Labs Lab 04/25/14 1229 04/26/14 0525  WBC 9.6 9.2  HGB 10.2* 11.0*  HCT 30.9* 34.0*  PLT 315 340  MCV 91.4  94.4  MCH 30.2 30.6  MCHC 33.0 32.4  RDW 15.2 15.3  LYMPHSABS 1.2  --   MONOABS 0.8  --  EOSABS 0.0  --   BASOSABS 0.0  --     Chemistries   Recent Labs Lab 04/25/14 1229 04/26/14 0525  NA 134* 143  K 3.5* 4.2  CL 92* 98  CO2 30 33*  GLUCOSE 238* 203*  BUN 16 17  CREATININE 0.82 0.84  CALCIUM 9.1 9.7  AST 15  --   ALT 12  --   ALKPHOS 142*  --   BILITOT 0.3  --    ------------------------------------------------------------------------------------------------------------------ estimated creatinine clearance is 74.7 ml/min (by C-G formula based on Cr of 0.84). ------------------------------------------------------------------------------------------------------------------  Recent Labs  04/25/14 1229  HGBA1C 7.0*   ------------------------------------------------------------------------------------------------------------------ No results found for this basename: CHOL, HDL, LDLCALC, TRIG, CHOLHDL, LDLDIRECT,  in the last 72 hours ------------------------------------------------------------------------------------------------------------------ No results found for this basename: TSH, T4TOTAL, FREET3, T3FREE, THYROIDAB,  in the last 72 hours ------------------------------------------------------------------------------------------------------------------ No results found for this basename: VITAMINB12, FOLATE, FERRITIN, TIBC, IRON, RETICCTPCT,  in the last 72 hours  Coagulation profile No results found for this basename: INR, PROTIME,  in the last 168 hours  No results found for this basename: DDIMER,  in the last 72 hours  Cardiac Enzymes No results found for this basename: CK, CKMB, TROPONINI, MYOGLOBIN,  in the last 168 hours ------------------------------------------------------------------------------------------------------------------ No components found with this basename: POCBNP,      Time Spent in minutes   35   Thurnell Lose M.D on 04/26/2014 at  8:59 AM  Between 7am to 7pm - Pager - [phone]  After 7pm go to [email or web address] - password [email or web address]  And look for the night coverage person covering for me after hours  [facility] Office  [phone]

## 2014-04-26 NOTE — Progress Notes (Signed)
Noted on Problem List "hyperglycemia", but no doc of DM. No home meds noted.  HgbA1C of 7.0 % which is indicative of dx of dm. Pt on sensitive correction tidwc presently on "Hold" status. Last glucose this am at 251 mg/dL with no correction given. Pt now in recovery (?). Will follow and assist as needed.  Please resume correction but change to q 4 hrs post-op.  Thank you, Rosita Kea, RN, CNS, Diabetes Coordinator [phone])

## 2014-04-26 NOTE — Progress Notes (Signed)
IV team made aware of PICC line placement

## 2014-04-26 NOTE — Progress Notes (Signed)
INFECTIOUS DISEASE PROGRESS NOTE  ID: Kathryn Ryan is a 73 y.o. 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  Subjective: Without complaints Eating well  Abtx:  Anti-infectives   Start     Dose/Rate Route Frequency Ordered Stop   04/26/14 1000  vancomycin (VANCOCIN) IVPB 1000 mg/200 mL premix     1,000 mg 200 mL/hr over 60 Minutes Intravenous Every 12 hours 04/25/14 2028     04/25/14 2200  piperacillin-tazobactam (ZOSYN) IVPB 3.375 g     3.375 g 12.5 mL/hr over 240 Minutes Intravenous 3 times per day 04/25/14 2028     04/25/14 2100  vancomycin (VANCOCIN) 2,000 mg in sodium chloride 0.9 % 500 mL IVPB     2,000 mg 250 mL/hr over 120 Minutes Intravenous NOW 04/25/14 2028 04/25/14 2322   04/25/14 1400  vancomycin (VANCOCIN) 2,000 mg in sodium chloride 0.9 % 500 mL IVPB  Status:  Discontinued     2,000 mg 250 mL/hr over 120 Minutes Intravenous  Once 04/25/14 1356 04/25/14 1515   04/25/14 1400  piperacillin-tazobactam (ZOSYN) IVPB 3.375 g     3.375 g 100 mL/hr over 30 Minutes Intravenous  Once 04/25/14 1356 04/25/14 1624      Medications:  Scheduled: . sodium chloride   Intravenous STAT  . docusate sodium  200 mg Oral QHS  . heparin subcutaneous  5,000 Units Subcutaneous 3 times per day  . HYDROcodone-acetaminophen  2 tablet Oral QHS  . insulin aspart  0-9 Units Subcutaneous TID WC  . insulin glargine  15 Units Subcutaneous Daily  . levothyroxine  100 mcg Oral QAC breakfast  . lisinopril  10 mg Oral Daily  . pantoprazole  40 mg Oral Daily  . piperacillin-tazobactam (ZOSYN)  IV  3.375 g Intravenous 3 times per day  . pregabalin  150 mg Oral BID  . senna  1 tablet Oral BID  . vancomycin  1,000 mg Intravenous Q12H    Objective: Vital signs in last 24 hours: Temp:  [97.8 F (36.6 C)-99.7 F (37.6 C)] 98.2 F (36.8 C)  (05/20 1455) Pulse Rate:  [90-102] 90 (05/20 1455) Resp:  [14-21] 18 (05/20 1455) BP: (98-145)/(43-103) 113/58 mmHg (05/20 1455) SpO2:  [88 %-100 %] 96 % (05/20 1455) Weight:  [106.595 kg (235 lb)] 106.595 kg (235 lb) (05/19 1730)   General appearance: alert, cooperative and no distress Incision/Wound: L ankle dressed. No light touch in toes.  LUE peripheral IV.    Lab Results  Recent Labs  04/25/14 1229 04/26/14 0525  WBC 9.6 9.2  HGB 10.2* 11.0*  HCT 30.9* 34.0*  NA 134* 143  K 3.5* 4.2  CL 92* 98  CO2 30 33*  BUN 16 17  CREATININE 0.82 0.84   Liver Panel  Recent Labs  04/25/14 1229  PROT 7.0  ALBUMIN 2.4*  AST 15  ALT 12  ALKPHOS 142*  BILITOT 0.3   Sedimentation Rate No results found for this basename: ESRSEDRATE,  in the last 72 hours C-Reactive Protein No results found for this basename: CRP,  in the last 72 hours  Microbiology: Recent Results (from the past 240 hour(s))  CULTURE, BLOOD (ROUTINE X 2)     Status: None   Collection Time    04/25/14  4:22 PM      Result Value Ref Range Status  Specimen Description BLOOD RIGHT ARM   Final   Special Requests BOTTLES DRAWN AEROBIC AND ANAEROBIC 10CC   Final   Culture  Setup Time     Final   Value: 04/25/2014 20:01     Performed at [facility]   Culture     Final   Value:        BLOOD CULTURE RECEIVED NO GROWTH TO DATE CULTURE WILL BE HELD FOR 5 DAYS BEFORE ISSUING A FINAL NEGATIVE REPORT     Performed at [facility]   Report Status PENDING   Incomplete  CULTURE, BLOOD (ROUTINE X 2)     Status: None   Collection Time    04/25/14  4:39 PM      Result Value Ref Range Status   Specimen Description BLOOD ARM LEFT   Final   Special Requests BOTTLES DRAWN AEROBIC AND ANAEROBIC 5CC   Final   Culture  Setup Time     Final   Value: 04/25/2014 23:20     Performed at [facility]   Culture     Final   Value:        BLOOD CULTURE RECEIVED NO GROWTH TO DATE CULTURE WILL BE HELD FOR 5  DAYS BEFORE ISSUING A FINAL NEGATIVE REPORT     Performed at [facility]   Report Status PENDING   Incomplete  BODY FLUID CULTURE     Status: None   Collection Time    04/25/14  8:03 PM      Result Value Ref Range Status   Specimen Description SYNOVIAL FLUID ANKLE LEFT   Final   Special Requests Normal   Final   Gram Stain     Final   Value: FEW WBC PRESENT,BOTH PMN AND MONONUCLEAR     NO ORGANISMS SEEN     Performed at [facility]   Culture     Final   Value: NO GROWTH     Performed at [facility]   Report Status PENDING   Incomplete  SURGICAL PCR SCREEN     Status: None   Collection Time    04/26/14  5:51 AM      Result Value Ref Range Status   MRSA, PCR NEGATIVE  NEGATIVE Final   Staphylococcus aureus NEGATIVE  NEGATIVE Final   Comment:            The Xpert SA Assay (FDA     approved for NASAL specimens     in patients over 67 years of age),     is one component of     a comprehensive surveillance     program.  Test performance has     been validated by [employer] for patients greater     than or equal to 6 year old.     It is not intended     to diagnose infection nor to     guide or monitor treatment.    Studies/Results: Dg Ankle Complete Left  04/25/2014   CLINICAL DATA:  Wound infection  EXAM: LEFT ANKLE COMPLETE - 3+ VIEW  COMPARISON:  None.  FINDINGS: Frontal, oblique, and lateral views were obtained. Two screws transfix with the hindfoot, including the anterior subtalar joint from a medial approach. Both screws have fractures in their respective mid portions.  There is marked soft tissue swelling throughout the ankle joint with foci of soft tissue air medially concerning for abscess. No acute fracture is  seen. The ankle mortise appears intact. There is fluid in the joint. 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 There also surgical screws transfixing the calcaneocuboid joint region.  IMPRESSION: Marked soft tissue swelling with air  medially. Suspect abscess within the soft tissues surrounding the ankle and hindfoot. There is postoperative change with 2 screws transfixing the anterior subtalar joint. There are fractures in the mid portions of each screw. There are also screws to the calcaneocuboid joint. The screws do not appear fractured. No acute fracture. Joint effusion which could potentially represent infection within the joint.  Given these findings, it may be prudent to consider contrast enhanced CT to further evaluate.  Critical Value/emergent results were called by telephone at the time of interpretation on 04/25/2014 at 1:33 PM to Dr. Francine Graven , who verbally acknowledged these results.   Electronically Signed   By: Lowella Grip M.D.   On: 04/25/2014 13:34   Mr Ankle Left W Wo Contrast  04/25/2014   CLINICAL DATA:  Left foot pain and swelling. History of prior surgery.  EXAM: MRI OF THE LEFT ANKLE WITHOUT AND WITH CONTRAST  TECHNIQUE: Multiplanar, multisequence MR imaging of the ankle was performed before and after the administration of intravenous contrast.  CONTRAST:  1 MULTIHANCE GADOBENATE DIMEGLUMINE 529 MG/ML IV SOLN  COMPARISON:  Radiographs 04/25/2014  FINDINGS: Examination is limited by patient motion and artifact from hardware.  Hardware is noted in the midfoot with fractured screws noted on the radiographs. There is diffuse soft tissue swelling/edema and multiple rim enhancing subcutaneous fluid collections suspicious for abscesses. The largest cyst along the lateral malleolus. There is also a large joint effusion involving the tibiotalar joint with thick rim enhancing synovium. Could not exclude septic arthritis. Recommend joint aspiration. It is difficult to evaluate for osteomyelitis because of the significant artifact. Severe talonavicular joint degenerative changes are noted with moderate subluxation. Rim enhancing joint effusion is noted here also.  IMPRESSION: IMPRESSION Medial and lateral soft tissue  abscesses.  Joint effusions and marked synovial enhancement worrisome for septic arthritis. This mainly involves the tibiotalar joint and talonavicular joint.  Limited examination for osteomyelitis due to artifact and patient motion.   Electronically Signed   By: Kalman Jewels M.D.   On: 04/25/2014 16:36   Dg Chest Port 1 View  04/26/2014   CLINICAL DATA:  Cough  EXAM: PORTABLE CHEST - 1 VIEW  COMPARISON:  Chest radiograph 02/23/2012  FINDINGS: Lung volumes are low and the patient is rotated to the right. Heart, mediastinal, and hilar contours appear within normal limits for portable technique and low lung volumes. There is suggestion of bilateral peribronchial thickening. No airspace consolidation, edema, or pleural effusion identified. Negative for pneumothorax. Bones unremarkable.  IMPRESSION: Low lung volumes. Question bilateral peribronchial thickening. This can be seen in the setting of acute or chronic bronchitis, smoking, or asthma.   Electronically Signed   By: Curlene Dolphin M.D.   On: 04/26/2014 09:25   Dg Fluoro Guide Ndl Plc/bx  04/25/2014   CLINICAL DATA:  23-YEAR-OLD FEMALE WITH SUSPECTED SEPTIC LEFT ANKLE.  EXAM: LEFT ANKLE JOINT ASPIRATION  TECHNIQUE: Written informed consent was obtained from the patient following explanation of the risks, benefits and alternatives. The ankle was prepped and draped in the usual fashion using Betadine skin prep. Localization was performed under fluoroscopy. A 20 gauge spinal needle was advanced into the tibiotalar joint and a small volume of turbid bloody fluid was aspirated. An 18 gauge needle was also advanced into an area of  fluctuance overlying the lateral malleolus. Approximately 1 cc of purulent fluid was successfully aspirated.  Hemostasis was attained by gentle manual pressure and Band-Aids were applied. The patient tolerated the procedure well. There was no immediate complication.  FLUOROSCOPY TIME:  37 seconds  COMPARISON:  Ankle MRI 04/25/2014   FINDINGS: Successful aspiration of a small amount of purulent/bloody fluid from the tibiotalar joint and fluid overlying the lateral malleolus.  IMPRESSION: Successful aspiration of a small amount of purulent/bloody fluid from the tibiotalar joint and fluid overlying the lateral malleolus.  Fluid sent for Gram stain and culture.   Electronically Signed   By: Jacqulynn Cadet M.D.   On: 04/25/2014 21:26     Assessment/Plan: Osteomyelitis L foot/ankle DM2 (new)  Spoke with Dr Doran Durand, await OR Cx. Hardware out.  Will place [facility], discussed with pt and her friend.   Total days of antibiotics 2 (vanco/zosyn)         [facility] (pager) [phone] [email or web address] 04/26/2014, 3:06 PM  LOS: 1 day   **Disclaimer: This note may have been dictated with voice recognition software. Similar sounding words can inadvertently be transcribed and this note may contain transcription errors which may not have been corrected upon publication of note.**

## 2014-04-26 NOTE — Transfer of Care (Signed)
Immediate Anesthesia Transfer of Care Note  Patient: Kathryn Ryan  Procedure(s) Performed: Procedure(s): HARDWARE REMOVAL (Left) IRRIGATION AND DEBRIDEMENT EXTREMITY (Left)  Patient Location: PACU  Anesthesia Type:General  Level of Consciousness: awake, alert  and oriented  Airway & Oxygen Therapy: Patient Spontanous Breathing  Post-op Assessment: Report given to PACU RN  Post vital signs: Reviewed and stable  Complications: No apparent anesthesia complications

## 2014-04-26 NOTE — Brief Op Note (Signed)
04/25/2014 - 04/26/2014  9:35 PM  PATIENT:  Dema Severin  73 y.o. female  PRE-OPERATIVE DIAGNOSIS: 1.  Left ankle pyarthrosis      2.  Left ankle painful hardware      3.  Left foot medial and lateral abscesses  POST-OPERATIVE DIAGNOSIS:  same  Procedure(s): 1.  Removal of deep implants from talonavicular joint medially 2.  Irrigation and debridement of left foot abscesses in multiple locations 3.  Removal of deep implants from calcaneocuboid joint laterally (separate incision) 4.  Left ankle arthrotomy with irrigation and debridement 5.  AP and lateral xrays of the left foot  SURGEON:  Wylene Simmer, MD  ASSISTANT: n/a  ANESTHESIA:   General  EBL:  minimal   TOURNIQUET:   Total Tourniquet Time Documented: Thigh (Left) - 51 minutes Total: Thigh (Left) - 51 minutes   COMPLICATIONS:  None apparent  DISPOSITION:  Extubated, awake and stable to recovery.  DICTATION ID:  [id number]  Specimens:  Deep tissue to micro for aerobic and anaerobic culture

## 2014-04-27 ENCOUNTER — Encounter (HOSPITAL_COMMUNITY): Payer: Self-pay | Admitting: General Practice

## 2014-04-27 DIAGNOSIS — R269 Unspecified abnormalities of gait and mobility: Secondary | ICD-10-CM | POA: Diagnosis not present

## 2014-04-27 DIAGNOSIS — E119 Type 2 diabetes mellitus without complications: Secondary | ICD-10-CM | POA: Diagnosis not present

## 2014-04-27 DIAGNOSIS — T82897A Other specified complication of cardiac prosthetic devices, implants and grafts, initial encounter: Secondary | ICD-10-CM | POA: Diagnosis not present

## 2014-04-27 DIAGNOSIS — M869 Osteomyelitis, unspecified: Secondary | ICD-10-CM | POA: Diagnosis not present

## 2014-04-27 DIAGNOSIS — R5381 Other malaise: Secondary | ICD-10-CM | POA: Diagnosis not present

## 2014-04-27 DIAGNOSIS — L02419 Cutaneous abscess of limb, unspecified: Secondary | ICD-10-CM | POA: Diagnosis not present

## 2014-04-27 DIAGNOSIS — Y849 Medical procedure, unspecified as the cause of abnormal reaction of the patient, or of later complication, without mention of misadventure at the time of the procedure: Secondary | ICD-10-CM | POA: Diagnosis not present

## 2014-04-27 LAB — GLUCOSE, CAPILLARY
GLUCOSE-CAPILLARY: 185 mg/dL — AB (ref 70–99)
GLUCOSE-CAPILLARY: 251 mg/dL — AB (ref 70–99)
Glucose-Capillary: 168 mg/dL — ABNORMAL HIGH (ref 70–99)
Glucose-Capillary: 210 mg/dL — ABNORMAL HIGH (ref 70–99)

## 2014-04-27 NOTE — Progress Notes (Signed)
Patient Demographics  Kathryn Ryan, is a 73 y.o. female, DOB - 05/23/1941, [id number]  Admit date - 04/25/2014   Admitting Physician Verlee Monte, MD  Outpatient Primary MD for the patient is Gar Ponto, MD  LOS - 2   Chief Complaint  Patient presents with  . 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 Wylene Simmer on 04/26/2014, on empiric vancomycin and Zosyn. ID following. Continue to follow cultures. Weight bearing for orthopedics, patient has a PICC line in the right arm. Will defer duration and course of antibiotics per infectious disease. May need placement.   UTI?   On it her urine cultures.    Hyperglycemia  Patient has no documented history of DM. Newly diagnosed type 2 diabetes mellitus this admission. On SSI will add Lantus for better control.    Lab Results  Component Value Date   HGBA1C 7.0* 04/25/2014    CBG (last 3)   Recent Labs  04/26/14 1711 04/26/14 2154 04/27/14 0740  GLUCAP 193* 179* 185*      Hypothyroid  Will continue Synthroid     HTN  Holding HCTZ.  Will continue Lisinopril.       Neuropathy  Has a history of claudication with 2 spinal surgeries  Continue Lyrica   .  GERD  Continue PPI   Code Status: Full  Family Communication: none present  Disposition Plan: TBD   Procedures    04/26/2014 by Dr. Doran Durand   1. Removal of deep implants from  talonavicular joint medially  2. Irrigation and debridement of left foot abscesses in multiple locations  3. Removal of deep implants from calcaneocuboid joint laterally (separate incision)  4. Left ankle arthrotomy with irrigation and debridement    Consults  Otho, ID   Medications  Scheduled Meds: . docusate sodium  200 mg Oral QHS  . heparin subcutaneous  5,000 Units Subcutaneous 3 times per day  . HYDROcodone-acetaminophen  2 tablet Oral QHS  . insulin aspart  0-9 Units Subcutaneous TID WC  . insulin glargine  15 Units Subcutaneous Daily  . levothyroxine  100 mcg Oral QAC breakfast  . lisinopril  10 mg Oral Daily  . pantoprazole  40 mg Oral Daily  . piperacillin-tazobactam (ZOSYN)  IV  3.375 g Intravenous 3 times per day  . pregabalin  150 mg Oral BID  . senna  1 tablet Oral BID  . sodium chloride  10-40 mL Intracatheter Q12H  . vancomycin  1,000 mg Intravenous Q12H   Continuous Infusions: . lactated ringers 50 mL/hr at 04/26/14 1157   PRN Meds:.acetaminophen, acetaminophen, alum & mag hydroxide-simeth, hydrALAZINE, metoprolol, morphine injection, ondansetron (ZOFRAN) IV, ondansetron, oxyCODONE, sodium chloride  DVT Prophylaxis Heparin  Lab Results  Component Value Date   PLT 340 04/26/2014    Antibiotics     Anti-infectives   Start     Dose/Rate Route Frequency  Ordered Stop   04/26/14 1000  vancomycin (VANCOCIN) IVPB 1000 mg/200 mL premix     1,000 mg 200 mL/hr over 60 Minutes Intravenous Every 12 hours 04/25/14 2028     04/25/14 2200  piperacillin-tazobactam (ZOSYN) IVPB 3.375 g     3.375 g 12.5 mL/hr over 240 Minutes Intravenous 3 times per day 04/25/14 2028     04/25/14 2100  vancomycin (VANCOCIN) 2,000 mg in sodium chloride 0.9 % 500 mL IVPB     2,000 mg 250 mL/hr over 120 Minutes Intravenous NOW 04/25/14 2028 04/25/14 2322   04/25/14 1400  vancomycin (VANCOCIN) 2,000 mg in sodium chloride 0.9 % 500 mL IVPB  Status:  Discontinued     2,000 mg 250 mL/hr  over 120 Minutes Intravenous  Once 04/25/14 1356 04/25/14 1515   04/25/14 1400  piperacillin-tazobactam (ZOSYN) IVPB 3.375 g     3.375 g 100 mL/hr over 30 Minutes Intravenous  Once 04/25/14 1356 04/25/14 1624          Subjective:   Kathryn Ryan today has, No headache, No chest pain, No abdominal pain - No Nausea, No new weakness tingling or numbness, No Cough - SOB. Some pain in the left ankle feels better.  Objective:   Filed Vitals:   04/26/14 1455 04/26/14 1833 04/26/14 2227 04/27/14 0633  BP: 113/58 126/52 149/57 154/77  Pulse: 90 103 105 108  Temp: 98.2 F (36.8 C) 98.4 F (36.9 C) 99 F (37.2 C) 98.5 F (36.9 C)  TempSrc: Oral Oral Oral   Resp: 18 17 18 17   Height:      Weight:      SpO2: 96% 93% 96% 94%    Wt Readings from Last 3 Encounters:  04/25/14 106.595 kg (235 lb)  04/25/14 106.595 kg (235 lb)  02/27/12 107.1 kg (236 lb 1.8 oz)     Intake/Output Summary (Last 24 hours) at 04/27/14 0912 Last data filed at 04/27/14 9201  Gross per 24 hour  Intake    750 ml  Output     11 ml  Net    739 ml     Physical Exam  Awake Alert, Oriented X 3, No new F.N deficits, Normal affect [place].AT,PERRAL Supple Neck,No JVD, No cervical lymphadenopathy appriciated.  Symmetrical Chest wall movement, Good air movement bilaterally, CTAB RRR,No Gallops,Rubs or new Murmurs, No Parasternal Heave +ve B.Sounds, Abd Soft, Non tender, No organomegaly appriciated, No rebound - guarding or rigidity. No Cyanosis, Clubbing or edema, No new Rash or bruise,  Left ankle under bandage.   Data Review   Micro Results Recent Results (from the past 240 hour(s))  URINE CULTURE     Status: None   Collection Time    04/25/14 12:55 PM      Result Value Ref Range Status   Specimen Description URINE, CLEAN CATCH   Final   Special Requests NONE   Final   Culture  Setup Time     Final   Value: 04/25/2014 19:34     Performed at [facility]     Final   Value:  >=100,000 COLONIES/ML     Performed at [facility]   Culture     Final   Value: [place]     Performed at [facility]   Report Status PENDING   Incomplete  CULTURE, BLOOD (ROUTINE X 2)     Status: None   Collection Time    04/25/14  4:22 PM  Result Value Ref Range Status   Specimen Description BLOOD RIGHT ARM   Final   Special Requests BOTTLES DRAWN AEROBIC AND ANAEROBIC 10CC   Final   Culture  Setup Time     Final   Value: 04/25/2014 20:01     Performed at [facility]   Culture     Final   Value:        BLOOD CULTURE RECEIVED NO GROWTH TO DATE CULTURE WILL BE HELD FOR 5 DAYS BEFORE ISSUING A FINAL NEGATIVE REPORT     Performed at [facility]   Report Status PENDING   Incomplete  CULTURE, BLOOD (ROUTINE X 2)     Status: None   Collection Time    04/25/14  4:39 PM      Result Value Ref Range Status   Specimen Description BLOOD ARM LEFT   Final   Special Requests BOTTLES DRAWN AEROBIC AND ANAEROBIC 5CC   Final   Culture  Setup Time     Final   Value: 04/25/2014 23:20     Performed at [facility]   Culture     Final   Value:        BLOOD CULTURE RECEIVED NO GROWTH TO DATE CULTURE WILL BE HELD FOR 5 DAYS BEFORE ISSUING A FINAL NEGATIVE REPORT     Performed at [facility]   Report Status PENDING   Incomplete  BODY FLUID CULTURE     Status: None   Collection Time    04/25/14  8:03 PM      Result Value Ref Range Status   Specimen Description SYNOVIAL FLUID ANKLE LEFT   Final   Special Requests Normal   Final   Gram Stain     Final   Value: FEW WBC PRESENT,BOTH PMN AND MONONUCLEAR     NO ORGANISMS SEEN     Performed at [facility]   Culture     Final   Value: NO GROWTH     Performed at [facility]   Report Status PENDING   Incomplete  URINE CULTURE     Status: None   Collection Time    04/25/14  8:38 PM      Result Value Ref Range Status   Specimen Description URINE, RANDOM   Final    Special Requests NONE   Final   Culture  Setup Time     Final   Value: 04/26/2014 03:25     Performed at [place]     Final   Value: NO GROWTH     Performed at [facility]   Culture     Final   Value: NO GROWTH     Performed at [facility]   Report Status 04/26/2014 FINAL   Final  SURGICAL PCR SCREEN     Status: None   Collection Time    04/26/14  5:51 AM      Result Value Ref Range Status   MRSA, PCR NEGATIVE  NEGATIVE Final   Staphylococcus aureus NEGATIVE  NEGATIVE Final   Comment:            The Xpert SA Assay (FDA     approved for NASAL specimens     in patients over 60 years of age),     is one component of     a comprehensive surveillance     program.  Test performance has     been validated  by [facility] for patients greater     than or equal to 79 year old.     It is not intended     to diagnose infection nor to     guide or monitor treatment.  ANAEROBIC CULTURE     Status: None   Collection Time    04/26/14  2:03 PM      Result Value Ref Range Status   Specimen Description WOUND ANKLE LEFT   Final   Special Requests PATIENT ON FOLLOWING VANCOMYCIN   Final   Gram Stain     Final   Value: NO WBC SEEN     NO ORGANISMS SEEN     Performed at [facility]   Culture PENDING   Incomplete   Report Status PENDING   Incomplete  TISSUE CULTURE     Status: None   Collection Time    04/26/14  2:03 PM      Result Value Ref Range Status   Specimen Description TISSUE LEFT ANKLE   Final   Special Requests PATIENT ON FOLLOWING VANCOMYCIN   Final   Gram Stain     Final   Value: NO WBC SEEN     NO ORGANISMS SEEN     Performed at [facility]   Culture PENDING   Incomplete   Report Status PENDING   Incomplete    Radiology Reports Dg Ankle Complete Left  04/25/2014   CLINICAL DATA:  Wound infection  EXAM: LEFT ANKLE COMPLETE - 3+ VIEW  COMPARISON:  None.  FINDINGS: Frontal, oblique, and lateral views were  obtained. Two screws transfix with the hindfoot, including the anterior subtalar joint from a medial approach. Both screws have fractures in their respective mid portions.  There is marked soft tissue swelling throughout the ankle joint with foci of soft tissue air medially concerning for abscess. No acute fracture is seen. The ankle mortise appears intact. There is fluid in the joint. 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 There also surgical screws transfixing the calcaneocuboid joint region.  IMPRESSION: Marked soft tissue swelling with air medially. Suspect abscess within the soft tissues surrounding the ankle and hindfoot. There is postoperative change with 2 screws transfixing the anterior subtalar joint. There are fractures in the mid portions of each screw. There are also screws to the calcaneocuboid joint. The screws do not appear fractured. No acute fracture. Joint effusion which could potentially represent infection within the joint.  Given these findings, it may be prudent to consider contrast enhanced CT to further evaluate.  Critical Value/emergent results were called by telephone at the time of interpretation on 04/25/2014 at 1:33 PM to Dr. Francine Graven , who verbally acknowledged these results.   Electronically Signed   By: Lowella Grip M.D.   On: 04/25/2014 13:34   Mr Ankle Left W Wo Contrast  04/25/2014   CLINICAL DATA:  Left foot pain and swelling. History of prior surgery.  EXAM: MRI OF THE LEFT ANKLE WITHOUT AND WITH CONTRAST  TECHNIQUE: Multiplanar, multisequence MR imaging of the ankle was performed before and after the administration of intravenous contrast.  CONTRAST:  1 MULTIHANCE GADOBENATE DIMEGLUMINE 529 MG/ML IV SOLN  COMPARISON:  Radiographs 04/25/2014  FINDINGS: Examination is limited by patient motion and artifact from hardware.  Hardware is noted in the midfoot with fractured screws noted on the radiographs. There is diffuse soft tissue swelling/edema and multiple  rim enhancing subcutaneous fluid collections suspicious for abscesses. The largest cyst  along the lateral malleolus. There is also a large joint effusion involving the tibiotalar joint with thick rim enhancing synovium. Could not exclude septic arthritis. Recommend joint aspiration. It is difficult to evaluate for osteomyelitis because of the significant artifact. Severe talonavicular joint degenerative changes are noted with moderate subluxation. Rim enhancing joint effusion is noted here also.  IMPRESSION: IMPRESSION Medial and lateral soft tissue abscesses.  Joint effusions and marked synovial enhancement worrisome for septic arthritis. This mainly involves the tibiotalar joint and talonavicular joint.  Limited examination for osteomyelitis due to artifact and patient motion.   Electronically Signed   By: Kalman Jewels M.D.   On: 04/25/2014 16:36   Dg Fluoro Guide Ndl Plc/bx  04/25/2014   CLINICAL DATA:  21-YEAR-OLD FEMALE WITH SUSPECTED SEPTIC LEFT ANKLE.  EXAM: LEFT ANKLE JOINT ASPIRATION  TECHNIQUE: Written informed consent was obtained from the patient following explanation of the risks, benefits and alternatives. The ankle was prepped and draped in the usual fashion using Betadine skin prep. Localization was performed under fluoroscopy. A 20 gauge spinal needle was advanced into the tibiotalar joint and a small volume of turbid bloody fluid was aspirated. An 18 gauge needle was also advanced into an area of fluctuance overlying the lateral malleolus. Approximately 1 cc of purulent fluid was successfully aspirated.  Hemostasis was attained by gentle manual pressure and Band-Aids were applied. The patient tolerated the procedure well. There was no immediate complication.  FLUOROSCOPY TIME:  37 seconds  COMPARISON:  Ankle MRI 04/25/2014  FINDINGS: Successful aspiration of a small amount of purulent/bloody fluid from the tibiotalar joint and fluid overlying the lateral malleolus.  IMPRESSION: Successful  aspiration of a small amount of purulent/bloody fluid from the tibiotalar joint and fluid overlying the lateral malleolus.  Fluid sent for Gram stain and culture.   Electronically Signed   By: Jacqulynn Cadet M.D.   On: 04/25/2014 21:26    CBC  Recent Labs Lab 04/25/14 1229 04/26/14 0525  WBC 9.6 9.2  HGB 10.2* 11.0*  HCT 30.9* 34.0*  PLT 315 340  MCV 91.4 94.4  MCH 30.2 30.6  MCHC 33.0 32.4  RDW 15.2 15.3  LYMPHSABS 1.2  --   MONOABS 0.8  --   EOSABS 0.0  --   BASOSABS 0.0  --     Chemistries   Recent Labs Lab 04/25/14 1229 04/26/14 0525  NA 134* 143  K 3.5* 4.2  CL 92* 98  CO2 30 33*  GLUCOSE 238* 203*  BUN 16 17  CREATININE 0.82 0.84  CALCIUM 9.1 9.7  AST 15  --   ALT 12  --   ALKPHOS 142*  --   BILITOT 0.3  --    ------------------------------------------------------------------------------------------------------------------ estimated creatinine clearance is 74.7 ml/min (by C-G formula based on Cr of 0.84). ------------------------------------------------------------------------------------------------------------------  Recent Labs  04/25/14 1229  HGBA1C 7.0*   ------------------------------------------------------------------------------------------------------------------ No results found for this basename: CHOL, HDL, LDLCALC, TRIG, CHOLHDL, LDLDIRECT,  in the last 72 hours ------------------------------------------------------------------------------------------------------------------ No results found for this basename: TSH, T4TOTAL, FREET3, T3FREE, THYROIDAB,  in the last 72 hours ------------------------------------------------------------------------------------------------------------------ No results found for this basename: VITAMINB12, FOLATE, FERRITIN, TIBC, IRON, RETICCTPCT,  in the last 72 hours  Coagulation profile No results found for this basename: INR, PROTIME,  in the last 168 hours  No results found for this basename: DDIMER,  in  the last 72 hours  Cardiac Enzymes No results found for this basename: CK, CKMB, TROPONINI, MYOGLOBIN,  in the last 168 hours ------------------------------------------------------------------------------------------------------------------  No components found with this basename: POCBNP,      Time Spent in minutes   35   Thurnell Lose M.D on 04/27/2014 at 9:12 AM  Between 7am to 7pm - Pager - [phone]  After 7pm go to [email or web address] - password [email or web address]  And look for the night coverage person covering for me after hours  [facility] Office  [phone]

## 2014-04-27 NOTE — Op Note (Signed)
NAME:  Kathryn Ryan, Kathryn Ryan NO.:  [id number]  MEDICAL RECORD NO.:  [id number]  LOCATION:  6N06C                        FACILITY:  [facility]  PHYSICIAN:  Wylene Simmer, MD        DATE OF BIRTH:  07/20/1941  DATE OF PROCEDURE:  04/26/2014 DATE OF DISCHARGE:                              OPERATIVE REPORT   PREOPERATIVE DIAGNOSIS:  Left foot abscesses and ankle pyarthrosis.  POSTOPERATIVE DIAGNOSIS:  Left foot abscesses and ankle pyarthrosis.  PROCEDURE: 1. Irrigation and debridement of left foot abscesses in multiple     areas. 2. Left ankle arthrotomy with irrigation and debridement. 3. Removal of deep implants from the talonavicular joint medially. 4. Removal of deep implants from the calcaneocuboid joint laterally     through a separate incision. 5. AP and lateral fluoroscopic images of the left foot.  SURGEON:  Wylene Simmer, MD.  ANESTHESIA:  General.  ESTIMATED BLOOD LOSS:  Minimal.  TOURNIQUET TIME:  Less than 1 hour at 250 mmHg.  COMPLICATIONS:  None apparent.  DISPOSITION:  Extubated, awake, and stable to recovery.  SPECIMENS:  Deep tissue from the ankle to Microbiology for aerobic and anaerobic culture.  INDICATIONS FOR PROCEDURE:  The patient is a 73 year old woman who underwent talonavicular and calcaneocuboid joint arthrodesis last year by a Psychologist, sport and exercise in [place].  She presented to my office last week with a draining wound from the medial foot.  There was no evidence at that time of infection, but her hardware was prominent.  She presented to [place] yesterday for removal of the hardware, but was found to have significant swelling of the foot and ankle that was new.  She was also feeling quite poorly.  She was sent to emergency room at [facility] and admitted to the hospital.  She was started on vancomycin and Zosyn after ultrasound-guided aspiration of the abscesses as well the ankle.  She presents now for I and D of these abscesses in  multiple locations as well as ankle joint.  She understands the risks and benefits, alternative treatment options, and elects surgical treatment. She specifically understands risks of bleeding, infection, nerve damage, blood clots, need for additional surgery, continued pain, amputation, and death.  PROCEDURE IN DETAIL:  After preoperative consent was obtained, the correct operative site was identified.  The patient was brought to the operating room and placed supine on the op table.  General anesthesia was induced.  Preop antibiotics were administered.  Surgical time-out was taken.  Left lower extremity was prepped and draped in standard sterile fashion with tourniquet around the thigh.  The extremity was exsanguinated and tourniquet was inflated to 250 mmHg.  The patient's medial wound was identified.  The previous medial incision was opened sharply and the wound itself was excised in its entirety.  Immediately evident was screw head.  This screw was grasped and removed.  The other screw in the same location was also removed.  Both of the screws were noted to be a broken on x-rays preoperatively.  A trephine was then used to remove the broken off piece of screw within the talus.  This was repeated for the second broken piece of screw  in the talus.  Both screws were removed in their entirety.  The screw tracks were curetted with a sharp curette and carefully.  Immediately adjacent to this area, was an area of fluctuance.  This was opened bluntly with a hemostat.  There was approximately 4 mL of grossly purulent material.  This was sent as a specimen to Microbiology for aerobic and anaerobic culture.  This area was then bluntly dissected with a hemostat.  A curette was used to remove the lining of this abscess.  The wound was then irrigated copiously with 3 L normal saline.  All nonviable tissue was removed sharply with scissors.  A flat JP drain was then placed in the wound brought out  proximally.  The wound was then loosely approximated with horizontal mattress sutures of 3-0 nylon.  Attention was then turned to the lateral aspect of the hindfoot.  The patient's lateral incision was identified and was made again.  This opened the lateral abscess.  Again, grossly purulent material was expressed.  The area was sharply debrided of all purulent nonviable tissue.  The abscess was curetted. The abscess seemed to extend into the retrocalcaneal space.  The screw heads were identified at the anterior process of the calcaneus.  The bone was resected with the rongeur to expose the screw heads and they were both backed out in their entirety and were noted to be intact.  AP and lateral radiographs of the foot showed complete removal of all hardware.  There was no evidence of fracture dislocation.  There was nonunion evident at the talonavicular joint and what appeared to be nonunion at the calcaneocuboid joint.  The ankle joint was then opened through this same incision.  There was some seropurulent material.  The ankle joint and the lateral wound was irrigated again copiously with 3 L of normal saline.  The drain was placed laterally in the abscess.  The wound was closed with horizontal mattress sutures of 3-0 nylon.  Sterile dressings were applied followed by a well-padded short-leg splint.  Tourniquet was released after application of the dressings.  The patient was awakened by anesthesia, transported to recovery room in stable condition.  FOLLOWUP PLAN:  The patient is weightbearing as tolerated up to left foot.  She will continue on IV vancomycin and Zosyn pending culture results.. She likely will need repeat I and D of the ankle on this admission.     Wylene Simmer, MD     JH/MEDQ  D:  04/26/2014  T:  04/27/2014  Job:  [id number]

## 2014-04-27 NOTE — Progress Notes (Signed)
INFECTIOUS DISEASE PROGRESS NOTE  ID: Kathryn Ryan is a 73 y.o. 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  Subjective: Without complaints  Abtx:  Anti-infectives   Start     Dose/Rate Route Frequency Ordered Stop   04/26/14 1000  vancomycin (VANCOCIN) IVPB 1000 mg/200 mL premix     1,000 mg 200 mL/hr over 60 Minutes Intravenous Every 12 hours 04/25/14 2028     04/25/14 2200  piperacillin-tazobactam (ZOSYN) IVPB 3.375 g     3.375 g 12.5 mL/hr over 240 Minutes Intravenous 3 times per day 04/25/14 2028     04/25/14 2100  vancomycin (VANCOCIN) 2,000 mg in sodium chloride 0.9 % 500 mL IVPB     2,000 mg 250 mL/hr over 120 Minutes Intravenous NOW 04/25/14 2028 04/25/14 2322   04/25/14 1400  vancomycin (VANCOCIN) 2,000 mg in sodium chloride 0.9 % 500 mL IVPB  Status:  Discontinued     2,000 mg 250 mL/hr over 120 Minutes Intravenous  Once 04/25/14 1356 04/25/14 1515   04/25/14 1400  piperacillin-tazobactam (ZOSYN) IVPB 3.375 g     3.375 g 100 mL/hr over 30 Minutes Intravenous  Once 04/25/14 1356 04/25/14 1624      Medications:  Scheduled: . docusate sodium  200 mg Oral QHS  . heparin subcutaneous  5,000 Units Subcutaneous 3 times per day  . HYDROcodone-acetaminophen  2 tablet Oral QHS  . insulin aspart  0-9 Units Subcutaneous TID WC  . insulin glargine  15 Units Subcutaneous Daily  . levothyroxine  100 mcg Oral QAC breakfast  . lisinopril  10 mg Oral Daily  . pantoprazole  40 mg Oral Daily  . piperacillin-tazobactam (ZOSYN)  IV  3.375 g Intravenous 3 times per day  . pregabalin  150 mg Oral BID  . senna  1 tablet Oral BID  . sodium chloride  10-40 mL Intracatheter Q12H  . vancomycin  1,000 mg Intravenous Q12H    Objective: Vital signs in last 24 hours: Temp:  [97.8 F (36.6 C)-99 F (37.2 C)] 98.5 F (36.9 C) (05/21  2330) Pulse Rate:  [90-108] 108 (05/21 0633) Resp:  [14-19] 17 (05/21 0633) BP: (98-154)/(43-77) 154/77 mmHg (05/21 0633) SpO2:  [88 %-96 %] 94 % (05/21 0762)   General appearance: alert, cooperative and no distress Incision/Wound: L ankle dressed. Drains in place.   Lab Results  Recent Labs  04/25/14 1229 04/26/14 0525  WBC 9.6 9.2  HGB 10.2* 11.0*  HCT 30.9* 34.0*  NA 134* 143  K 3.5* 4.2  CL 92* 98  CO2 30 33*  BUN 16 17  CREATININE 0.82 0.84   Liver Panel  Recent Labs  04/25/14 1229  PROT 7.0  ALBUMIN 2.4*  AST 15  ALT 12  ALKPHOS 142*  BILITOT 0.3   Sedimentation Rate No results found for this basename: ESRSEDRATE,  in the last 72 hours C-Reactive Protein No results found for this basename: CRP,  in the last 72 hours  Microbiology: Recent Results (from the past 240 hour(s))  URINE CULTURE     Status: None   Collection Time    04/25/14 12:55 PM      Result Value Ref Range Status   Specimen Description URINE, CLEAN CATCH   Final   Special Requests NONE   Final   Culture  Setup Time  Final   Value: 04/25/2014 19:34     Performed at [facility] Count     Final   Value: >=100,000 COLONIES/ML     Performed at [facility]   Culture     Final   Value: GRAM NEGATIVE RODS     Performed at [facility]   Report Status PENDING   Incomplete  CULTURE, BLOOD (ROUTINE X 2)     Status: None   Collection Time    04/25/14  4:22 PM      Result Value Ref Range Status   Specimen Description BLOOD RIGHT ARM   Final   Special Requests BOTTLES DRAWN AEROBIC AND ANAEROBIC 10CC   Final   Culture  Setup Time     Final   Value: 04/25/2014 20:01     Performed at [facility]   Culture     Final   Value:        BLOOD CULTURE RECEIVED NO GROWTH TO DATE CULTURE WILL BE HELD FOR 5 DAYS BEFORE ISSUING A FINAL NEGATIVE REPORT     Performed at [facility]   Report Status PENDING   Incomplete  CULTURE, BLOOD (ROUTINE X 2)      Status: None   Collection Time    04/25/14  4:39 PM      Result Value Ref Range Status   Specimen Description BLOOD ARM LEFT   Final   Special Requests BOTTLES DRAWN AEROBIC AND ANAEROBIC 5CC   Final   Culture  Setup Time     Final   Value: 04/25/2014 23:20     Performed at [facility]   Culture     Final   Value:        BLOOD CULTURE RECEIVED NO GROWTH TO DATE CULTURE WILL BE HELD FOR 5 DAYS BEFORE ISSUING A FINAL NEGATIVE REPORT     Performed at [facility]   Report Status PENDING   Incomplete  BODY FLUID CULTURE     Status: None   Collection Time    04/25/14  8:03 PM      Result Value Ref Range Status   Specimen Description SYNOVIAL FLUID ANKLE LEFT   Final   Special Requests Normal   Final   Gram Stain     Final   Value: FEW WBC PRESENT,BOTH PMN AND MONONUCLEAR     NO ORGANISMS SEEN     Performed at [facility]   Culture     Final   Value: NO GROWTH 1 DAY     Performed at [facility]   Report Status PENDING   Incomplete  URINE CULTURE     Status: None   Collection Time    04/25/14  8:38 PM      Result Value Ref Range Status   Specimen Description URINE, RANDOM   Final   Special Requests NONE   Final   Culture  Setup Time     Final   Value: 04/26/2014 03:25     Performed at [facility] Count     Final   Value: NO GROWTH     Performed at [facility]   Culture     Final   Value: NO GROWTH     Performed at [facility]   Report Status 04/26/2014 FINAL   Final  SURGICAL PCR SCREEN     Status: None   Collection Time  04/26/14  5:51 AM      Result Value Ref Range Status   MRSA, PCR NEGATIVE  NEGATIVE Final   Staphylococcus aureus NEGATIVE  NEGATIVE Final   Comment:            The Xpert SA Assay (FDA     approved for NASAL specimens     in patients over 59 years of age),     is one component of     a comprehensive surveillance     program.  Test performance has     been validated by  [employer] for patients greater     than or equal to 72 year old.     It is not intended     to diagnose infection nor to     guide or monitor treatment.  ANAEROBIC CULTURE     Status: None   Collection Time    04/26/14  2:03 PM      Result Value Ref Range Status   Specimen Description WOUND ANKLE LEFT   Final   Special Requests PATIENT ON FOLLOWING VANCOMYCIN   Final   Gram Stain     Final   Value: NO WBC SEEN     NO ORGANISMS SEEN     Performed at [facility]   Culture     Final   Value: NO ANAEROBES ISOLATED; CULTURE IN PROGRESS FOR 5 DAYS     Performed at [facility]   Report Status PENDING   Incomplete  TISSUE CULTURE     Status: None   Collection Time    04/26/14  2:03 PM      Result Value Ref Range Status   Specimen Description TISSUE LEFT ANKLE   Final   Special Requests PATIENT ON FOLLOWING VANCOMYCIN   Final   Gram Stain     Final   Value: NO WBC SEEN     NO ORGANISMS SEEN     Performed at [facility]   Culture PENDING   Incomplete   Report Status PENDING   Incomplete    Studies/Results: Dg Ankle Complete Left  04/25/2014   CLINICAL DATA:  Wound infection  EXAM: LEFT ANKLE COMPLETE - 3+ VIEW  COMPARISON:  None.  FINDINGS: Frontal, oblique, and lateral views were obtained. Two screws transfix with the hindfoot, including the anterior subtalar joint from a medial approach. Both screws have fractures in their respective mid portions.  There is marked soft tissue swelling throughout the ankle joint with foci of soft tissue air medially concerning for abscess. No acute fracture is seen. The ankle mortise appears intact. There is fluid in the joint. 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 There also surgical screws transfixing the calcaneocuboid joint region.  IMPRESSION: Marked soft tissue swelling with air medially. Suspect abscess within the soft tissues surrounding the ankle and hindfoot. There is postoperative change with 2 screws  transfixing the anterior subtalar joint. There are fractures in the mid portions of each screw. There are also screws to the calcaneocuboid joint. The screws do not appear fractured. No acute fracture. Joint effusion which could potentially represent infection within the joint.  Given these findings, it may be prudent to consider contrast enhanced CT to further evaluate.  Critical Value/emergent results were called by telephone at the time of interpretation on 04/25/2014 at 1:33 PM to Dr. Francine Graven , who verbally acknowledged these results.   Electronically Signed   By: Lowella Grip M.D.  On: 04/25/2014 13:34   Mr Ankle Left W Wo Contrast  04/25/2014   CLINICAL DATA:  Left foot pain and swelling. History of prior surgery.  EXAM: MRI OF THE LEFT ANKLE WITHOUT AND WITH CONTRAST  TECHNIQUE: Multiplanar, multisequence MR imaging of the ankle was performed before and after the administration of intravenous contrast.  CONTRAST:  1 MULTIHANCE GADOBENATE DIMEGLUMINE 529 MG/ML IV SOLN  COMPARISON:  Radiographs 04/25/2014  FINDINGS: Examination is limited by patient motion and artifact from hardware.  Hardware is noted in the midfoot with fractured screws noted on the radiographs. There is diffuse soft tissue swelling/edema and multiple rim enhancing subcutaneous fluid collections suspicious for abscesses. The largest cyst along the lateral malleolus. There is also a large joint effusion involving the tibiotalar joint with thick rim enhancing synovium. Could not exclude septic arthritis. Recommend joint aspiration. It is difficult to evaluate for osteomyelitis because of the significant artifact. Severe talonavicular joint degenerative changes are noted with moderate subluxation. Rim enhancing joint effusion is noted here also.  IMPRESSION: IMPRESSION Medial and lateral soft tissue abscesses.  Joint effusions and marked synovial enhancement worrisome for septic arthritis. This mainly involves the tibiotalar  joint and talonavicular joint.  Limited examination for osteomyelitis due to artifact and patient motion.   Electronically Signed   By: Kalman Jewels M.D.   On: 04/25/2014 16:36   Dg Chest Port 1 View  04/26/2014   CLINICAL DATA:  Cough  EXAM: PORTABLE CHEST - 1 VIEW  COMPARISON:  Chest radiograph 02/23/2012  FINDINGS: Lung volumes are low and the patient is rotated to the right. Heart, mediastinal, and hilar contours appear within normal limits for portable technique and low lung volumes. There is suggestion of bilateral peribronchial thickening. No airspace consolidation, edema, or pleural effusion identified. Negative for pneumothorax. Bones unremarkable.  IMPRESSION: Low lung volumes. Question bilateral peribronchial thickening. This can be seen in the setting of acute or chronic bronchitis, smoking, or asthma.   Electronically Signed   By: Curlene Dolphin M.D.   On: 04/26/2014 09:25   Dg Fluoro Guide Ndl Plc/bx  04/25/2014   CLINICAL DATA:  9-YEAR-OLD FEMALE WITH SUSPECTED SEPTIC LEFT ANKLE.  EXAM: LEFT ANKLE JOINT ASPIRATION  TECHNIQUE: Written informed consent was obtained from the patient following explanation of the risks, benefits and alternatives. The ankle was prepped and draped in the usual fashion using Betadine skin prep. Localization was performed under fluoroscopy. A 20 gauge spinal needle was advanced into the tibiotalar joint and a small volume of turbid bloody fluid was aspirated. An 18 gauge needle was also advanced into an area of fluctuance overlying the lateral malleolus. Approximately 1 cc of purulent fluid was successfully aspirated.  Hemostasis was attained by gentle manual pressure and Band-Aids were applied. The patient tolerated the procedure well. There was no immediate complication.  FLUOROSCOPY TIME:  37 seconds  COMPARISON:  Ankle MRI 04/25/2014  FINDINGS: Successful aspiration of a small amount of purulent/bloody fluid from the tibiotalar joint and fluid overlying the  lateral malleolus.  IMPRESSION: Successful aspiration of a small amount of purulent/bloody fluid from the tibiotalar joint and fluid overlying the lateral malleolus.  Fluid sent for Gram stain and culture.   Electronically Signed   By: Jacqulynn Cadet M.D.   On: 04/25/2014 21:26     Assessment/Plan: Osteomyelitis L foot/ankle  DM2 (new)   await Cx.  Hardware out 5-20.  PIC RUE Total days of antibiotics 3 (vanco/zosyn) Needs DM f/u  [facility] (pager) [phone] [email or web address] 04/27/2014, 11:36 AM  LOS: 2 days   **Disclaimer: This note may have been dictated with voice recognition software. Similar sounding words can inadvertently be transcribed and this note may contain transcription errors which may not have been corrected upon publication of note.**

## 2014-04-27 NOTE — Progress Notes (Signed)
Subjective: 1 Day Post-Op Procedure(s) (LRB): HARDWARE REMOVAL LEFT ANKLE (Left) IRRIGATION AND DEBRIDEMENT LEFT ANKLE (Left) Patient reports pain as mild.  No n/v/f/c.  No WB so far.  Objective: Vital signs in last 24 hours: Temp:  [98.4 F (36.9 C)-99 F (37.2 C)] 98.4 F (36.9 C) (05/21 1426) Pulse Rate:  [94-108] 94 (05/21 1426) Resp:  [17-18] 18 (05/21 1426) BP: (120-154)/(51-77) 120/51 mmHg (05/21 1426) SpO2:  [93 %-96 %] 93 % (05/21 1426)  Intake/Output from previous day: 05/20 0701 - 05/21 0700 In: 750 [I.V.:750] Out: 11 [Drains:11] Intake/Output this shift: Total I/O In: 1490 [P.O.:840; I.V.:400; IV Piggyback:250] Out: 5 [Drains:5]   Recent Labs  04/25/14 1229 04/26/14 0525  HGB 10.2* 11.0*    Recent Labs  04/25/14 1229 04/26/14 0525  WBC 9.6 9.2  RBC 3.38* 3.60*  HCT 30.9* 34.0*  PLT 315 340    Recent Labs  04/25/14 1229 04/26/14 0525  NA 134* 143  K 3.5* 4.2  CL 92* 98  CO2 30 33*  BUN 16 17  CREATININE 0.82 0.84  GLUCOSE 238* 203*  CALCIUM 9.1 9.7   No results found for this basename: LABPT, INR,  in the last 72 hours  L LE dressed and dry.  Drains with scant SS fluid.  No purulence.  Assessment/Plan: 1 Day Post-Op Procedure(s) (LRB): HARDWARE REMOVAL LEFT ANKLE (Left) IRRIGATION AND DEBRIDEMENT LEFT ANKLE (Left) Up with therapy ABX per ID.  Cxs pending.  Wylene Simmer 04/27/2014, 4:33 PM

## 2014-04-27 NOTE — Clinical Social Work Note (Signed)
CSW received consult for possible SNF placement at time of discharge pending PT/OT recommendations. CSW now following. Thank you for the referral.  ° °Emily [place], LCSWA  °Clinical Social Worker  °[phone] ° °

## 2014-04-28 ENCOUNTER — Encounter (HOSPITAL_COMMUNITY): Payer: Self-pay | Admitting: Orthopedic Surgery

## 2014-04-28 DIAGNOSIS — M869 Osteomyelitis, unspecified: Secondary | ICD-10-CM | POA: Diagnosis not present

## 2014-04-28 DIAGNOSIS — E119 Type 2 diabetes mellitus without complications: Secondary | ICD-10-CM | POA: Diagnosis not present

## 2014-04-28 DIAGNOSIS — N390 Urinary tract infection, site not specified: Secondary | ICD-10-CM | POA: Diagnosis not present

## 2014-04-28 DIAGNOSIS — B961 Klebsiella pneumoniae [K. pneumoniae] as the cause of diseases classified elsewhere: Secondary | ICD-10-CM | POA: Diagnosis not present

## 2014-04-28 DIAGNOSIS — L02419 Cutaneous abscess of limb, unspecified: Secondary | ICD-10-CM | POA: Diagnosis not present

## 2014-04-28 LAB — BASIC METABOLIC PANEL
BUN: 14 mg/dL (ref 6–23)
CO2: 31 meq/L (ref 19–32)
CREATININE: 0.88 mg/dL (ref 0.50–1.10)
Calcium: 8.6 mg/dL (ref 8.4–10.5)
Chloride: 99 mEq/L (ref 96–112)
GFR calc Af Amer: 74 mL/min — ABNORMAL LOW (ref >90)
GFR calc non Af Amer: 64 mL/min — ABNORMAL LOW (ref >90)
GLUCOSE: 172 mg/dL — AB (ref 70–99)
Potassium: 3.4 mEq/L — ABNORMAL LOW (ref 3.7–5.3)
Sodium: 142 mEq/L (ref 137–147)

## 2014-04-28 LAB — POTASSIUM: Potassium: 3.6 mEq/L — ABNORMAL LOW (ref 3.7–5.3)

## 2014-04-28 LAB — CBC
HCT: 32.2 % — ABNORMAL LOW (ref 36.0–46.0)
HEMOGLOBIN: 10.3 g/dL — AB (ref 12.0–15.0)
MCH: 30.1 pg (ref 26.0–34.0)
MCHC: 32 g/dL (ref 30.0–36.0)
MCV: 94.2 fL (ref 78.0–100.0)
Platelets: 378 10*3/uL (ref 150–400)
RBC: 3.42 MIL/uL — AB (ref 3.87–5.11)
RDW: 15.5 % (ref 11.5–15.5)
WBC: 7.7 10*3/uL (ref 4.0–10.5)

## 2014-04-28 LAB — URINE CULTURE

## 2014-04-28 LAB — VANCOMYCIN, TROUGH: Vancomycin Tr: 11.4 ug/mL (ref 10.0–20.0)

## 2014-04-28 LAB — GLUCOSE, CAPILLARY
GLUCOSE-CAPILLARY: 182 mg/dL — AB (ref 70–99)
Glucose-Capillary: 190 mg/dL — ABNORMAL HIGH (ref 70–99)
Glucose-Capillary: 155 mg/dL — ABNORMAL HIGH (ref 70–99)
Glucose-Capillary: 192 mg/dL — ABNORMAL HIGH (ref 70–99)

## 2014-04-28 MED ORDER — IBUPROFEN 100 MG PO CHEW
400.0000 mg | CHEWABLE_TABLET | Freq: Three times a day (TID) | ORAL | Status: DC | PRN
  Filled 2014-04-28: qty 4

## 2014-04-28 MED ORDER — POLYETHYLENE GLYCOL 3350 17 G PO PACK
17.0000 g | PACK | Freq: Two times a day (BID) | ORAL | Status: DC
  Administered 2014-04-28 – 2014-04-30 (×2): 17 g via ORAL
  Filled 2014-04-28 (×11): qty 1

## 2014-04-28 MED ORDER — IBUPROFEN 400 MG PO TABS
400.0000 mg | ORAL_TABLET | Freq: Three times a day (TID) | ORAL | Status: DC | PRN
  Administered 2014-04-28: 400 mg via ORAL
  Filled 2014-04-28: qty 1

## 2014-04-28 MED ORDER — INSULIN GLARGINE 100 UNIT/ML ~~LOC~~ SOLN
20.0000 [IU] | Freq: Every day | SUBCUTANEOUS | Status: DC
  Administered 2014-04-28: 20 [IU] via SUBCUTANEOUS
  Filled 2014-04-28 (×2): qty 0.2

## 2014-04-28 MED ORDER — POTASSIUM CHLORIDE CRYS ER 20 MEQ PO TBCR
40.0000 meq | EXTENDED_RELEASE_TABLET | ORAL | Status: AC
  Administered 2014-04-28 (×2): 40 meq via ORAL
  Filled 2014-04-28 (×2): qty 2

## 2014-04-28 MED ORDER — DOCUSATE SODIUM 100 MG PO CAPS
200.0000 mg | ORAL_CAPSULE | Freq: Two times a day (BID) | ORAL | Status: DC
  Administered 2014-04-28 – 2014-05-01 (×6): 200 mg via ORAL
  Filled 2014-04-28 (×6): qty 2

## 2014-04-28 MED ORDER — VANCOMYCIN HCL 10 G IV SOLR
1250.0000 mg | Freq: Two times a day (BID) | INTRAVENOUS | Status: DC
  Administered 2014-04-28 – 2014-05-01 (×7): 1250 mg via INTRAVENOUS
  Filled 2014-04-28 (×10): qty 1250

## 2014-04-28 NOTE — Evaluation (Signed)
Physical Therapy Evaluation Patient Details Name: Kathryn Ryan MRN: [id number] DOB: Jun 16, 1941 Today's Date: 04/28/2014   History of Present Illness  Pt is a 73 y.o. 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   Clinical Impression  Patient is s/p surgery listed above resulting in functional limitations due to the deficits listed below (see PT Problem List). Patient will benefit from skilled PT to increase their independence and safety with mobility to allow discharge to the venue listed below. Anticipate good progress with therapy. Pt very motivated to return to PLOF and be independent upon acute D/C. Pt requesting to attempt ambulating with knee walker, will plan to address next session.      Follow Up Recommendations Outpatient PT;Supervision - Intermittent    Equipment Recommendations  None recommended by PT    Recommendations for Other Services OT consult     Precautions / Restrictions Precautions Precautions: None Required Braces or Orthoses: Other Brace/Splint Other Brace/Splint: CAM walker  Restrictions Weight Bearing Restrictions: Yes LLE Weight Bearing: Weight bearing as tolerated      Mobility  Bed Mobility Overal bed mobility: Modified Independent             General bed mobility comments: bed flattened; use of handrails and incr time due to pain   Transfers Overall transfer level: Needs assistance Equipment used: Rolling walker (2 wheeled) Transfers: Sit to/from [facility] Sit to Stand: From elevated surface;Min assist Stand pivot transfers: Min guard;From elevated surface       General transfer comment: pt required min (A) to transfer to stand from bed due to lack of UE support; min guard from toilet due to arm rests and elevated seat; cues for sequencing and safety with RW   Ambulation/Gait Ambulation/Gait assistance: Min guard Ambulation Distance (Feet): 8 Feet Assistive device: Rolling  walker (2 wheeled) Gait Pattern/deviations: Decreased stance time - left;Decreased step length - right;Antalgic;Wide base of support;Trunk flexed Gait velocity: decreased Gait velocity interpretation: Below normal speed for age/gender General Gait Details: min guard to steady; cues for gt sequencing and upright posture  Stairs            Wheelchair Mobility    Modified Rankin (Stroke Patients Only)       Balance Overall balance assessment: Needs assistance Sitting-balance support: Feet supported;No upper extremity supported Sitting balance-Leahy Scale: Good     Standing balance support: During functional activity;Bilateral upper extremity supported Standing balance-Leahy Scale: Poor Standing balance comment: requires UE support from RW                             Pertinent Vitals/Pain 3/10; premedicated per pt     Home Living Family/patient expects to be discharged to:: Private residence Living Arrangements: Alone Available Help at Discharge: Friend(s);Available PRN/intermittently Type of Home: House Home Access: Stairs to enter Entrance Stairs-Rails: Right;Left;Can reach both Entrance Stairs-Number of Steps: 4-5 Home Layout: One level Home Equipment: Walker - 2 wheels;Shower seat - built in (knee walker ) Additional Comments: pt has walk in shower and elevated toilet seat     Prior Function Level of Independence: Independent with assistive device(s)         Comments: ambulating with cane PTA      Hand Dominance        Extremity/Trunk Assessment   Upper Extremity Assessment: Overall WFL for tasks assessed  Lower Extremity Assessment: Overall WFL for tasks assessed (Rt ankle limited due to surgery)      Cervical / Trunk Assessment: Kyphotic  Communication   Communication: No difficulties  Cognition Arousal/Alertness: Awake/alert Behavior During Therapy: WFL for tasks assessed/performed Overall Cognitive Status: Within  Functional Limits for tasks assessed                      General Comments      Exercises        Assessment/Plan    PT Assessment Patient needs continued PT services  PT Diagnosis Abnormality of gait;Acute pain   PT Problem List Decreased balance;Decreased activity tolerance;Decreased mobility;Pain  PT Treatment Interventions DME instruction;Gait training;Stair training;Functional mobility training;Therapeutic activities;Therapeutic exercise;Balance training;Neuromuscular re-education;Patient/family education   PT Goals (Current goals can be found in the Care Plan section) Acute Rehab PT Goals Patient Stated Goal: to go home this weekend  PT Goal Formulation: With patient Time For Goal Achievement: 05/02/14 Potential to Achieve Goals: Good    Frequency Min 4X/week   Barriers to discharge Decreased caregiver support pt lives alone; needs to be mod I upon D/C    Co-evaluation               End of Session Equipment Utilized During Treatment: Gait belt;Other (comment) (CAM walker ) Activity Tolerance: Patient tolerated treatment well Patient left: in chair;with call bell/phone within reach Nurse Communication: Mobility status;Precautions;Weight bearing status         Time: 1610-9604 PT Time Calculation (min): 20 min   Charges:   PT Evaluation $Initial PT Evaluation Tier I: 1 Procedure PT Treatments $Gait Training: 8-22 mins   PT G CodesKennis Carina [place], [place]  [phone] 04/28/2014, 3:11 PM

## 2014-04-28 NOTE — Progress Notes (Addendum)
ANTIBIOTIC CONSULT NOTE - FOLLOW UP  Pharmacy Consult for Vancomycin + Zosyn Indication: Left foot osteo and PJI  Allergies  Allergen Reactions  . Statins Other (See Comments)    Joint pain   . Sulfa Antibiotics Itching    Patient Measurements: Height: 5\' 6"  (167.6 cm) Weight: 235 lb (106.595 kg) IBW/kg (Calculated) : 59.3  Vital Signs: Temp: 98.1 F (36.7 C) (05/22 0641) Temp src: Oral (05/22 0641) BP: 152/64 mmHg (05/22 0641) Pulse Rate: 95 (05/22 0641) Intake/Output from previous day: 05/21 0701 - 05/22 0700 In: 4742 [P.O.:1080; I.V.:400; IV Piggyback:250] Out: 13 [Drains:13] Intake/Output from this shift: Total I/O In: 360 [P.O.:360] Out: -   Labs:  Recent Labs  04/26/14 0525 04/28/14 0530  WBC 9.2 7.7  HGB 11.0* 10.3*  PLT 340 378  CREATININE 0.84 0.88   Estimated Creatinine Clearance: 71.3 ml/min (by C-G formula based on Cr of 0.88).  Recent Labs  04/28/14 1105  [place] 11.4     Microbiology: Recent Results (from the past 720 hour(s))  URINE CULTURE     Status: None   Collection Time    04/25/14 12:55 PM      Result Value Ref Range Status   Specimen Description URINE, CLEAN CATCH   Final   Special Requests NONE   Final   Culture  Setup Time     Final   Value: 04/25/2014 19:34     Performed at [facility]     Final   Value: >=100,000 COLONIES/ML     Performed at [facility]   Culture     Final   Value: KLEBSIELLA PNEUMONIAE     Performed at [facility]   Report Status 04/28/2014 FINAL   Final   Organism ID, Bacteria KLEBSIELLA PNEUMONIAE   Final  CULTURE, BLOOD (ROUTINE X 2)     Status: None   Collection Time    04/25/14  4:22 PM      Result Value Ref Range Status   Specimen Description BLOOD RIGHT ARM   Final   Special Requests BOTTLES DRAWN AEROBIC AND ANAEROBIC 10CC   Final   Culture  Setup Time     Final   Value: 04/25/2014 20:01     Performed at [facility]   Culture      Final   Value:        BLOOD CULTURE RECEIVED NO GROWTH TO DATE CULTURE WILL BE HELD FOR 5 DAYS BEFORE ISSUING A FINAL NEGATIVE REPORT     Performed at [facility]   Report Status PENDING   Incomplete  CULTURE, BLOOD (ROUTINE X 2)     Status: None   Collection Time    04/25/14  4:39 PM      Result Value Ref Range Status   Specimen Description BLOOD ARM LEFT   Final   Special Requests BOTTLES DRAWN AEROBIC AND ANAEROBIC 5CC   Final   Culture  Setup Time     Final   Value: 04/25/2014 23:20     Performed at [facility]   Culture     Final   Value:        BLOOD CULTURE RECEIVED NO GROWTH TO DATE CULTURE WILL BE HELD FOR 5 DAYS BEFORE ISSUING A FINAL NEGATIVE REPORT     Performed at [facility]   Report Status PENDING   Incomplete  BODY FLUID CULTURE     Status: None   Collection Time  04/25/14  8:03 PM      Result Value Ref Range Status   Specimen Description SYNOVIAL FLUID ANKLE LEFT   Final   Special Requests Normal   Final   Gram Stain     Final   Value: FEW WBC PRESENT,BOTH PMN AND MONONUCLEAR     NO ORGANISMS SEEN     Performed at [facility]   Culture     Final   Value: NO GROWTH 2 DAYS     Performed at [facility]   Report Status PENDING   Incomplete  URINE CULTURE     Status: None   Collection Time    04/25/14  8:38 PM      Result Value Ref Range Status   Specimen Description URINE, RANDOM   Final   Special Requests NONE   Final   Culture  Setup Time     Final   Value: 04/26/2014 03:25     Performed at [facility]     Final   Value: NO GROWTH     Performed at [facility]   Culture     Final   Value: NO GROWTH     Performed at [facility]   Report Status 04/26/2014 FINAL   Final  SURGICAL PCR SCREEN     Status: None   Collection Time    04/26/14  5:51 AM      Result Value Ref Range Status   MRSA, PCR NEGATIVE  NEGATIVE Final   Staphylococcus aureus NEGATIVE  NEGATIVE Final    Comment:            The Xpert SA Assay (FDA     approved for NASAL specimens     in patients over 80 years of age),     is one component of     a comprehensive surveillance     program.  Test performance has     been validated by [employer] for patients greater     than or equal to 60 year old.     It is not intended     to diagnose infection nor to     guide or monitor treatment.  ANAEROBIC CULTURE     Status: None   Collection Time    04/26/14  2:03 PM      Result Value Ref Range Status   Specimen Description WOUND ANKLE LEFT   Final   Special Requests PATIENT ON FOLLOWING VANCOMYCIN   Final   Gram Stain     Final   Value: NO WBC SEEN     NO ORGANISMS SEEN     Performed at [facility]   Culture     Final   Value: NO ANAEROBES ISOLATED; CULTURE IN PROGRESS FOR 5 DAYS     Performed at [facility]   Report Status PENDING   Incomplete  TISSUE CULTURE     Status: None   Collection Time    04/26/14  2:03 PM      Result Value Ref Range Status   Specimen Description TISSUE LEFT ANKLE   Final   Special Requests PATIENT ON FOLLOWING VANCOMYCIN   Final   Gram Stain     Final   Value: NO WBC SEEN     NO ORGANISMS SEEN     Performed at [facility]   Culture     Final   Value: Culture  reincubated for better growth     Performed at [place] PENDING   Incomplete    Anti-infectives   Start     Dose/Rate Route Frequency Ordered Stop   04/26/14 1000  vancomycin (VANCOCIN) IVPB 1000 mg/200 mL premix     1,000 mg 200 mL/hr over 60 Minutes Intravenous Every 12 hours 04/25/14 2028     04/25/14 2200  piperacillin-tazobactam (ZOSYN) IVPB 3.375 g     3.375 g 12.5 mL/hr over 240 Minutes Intravenous 3 times per day 04/25/14 2028     04/25/14 2100  vancomycin (VANCOCIN) 2,000 mg in sodium chloride 0.9 % 500 mL IVPB     2,000 mg 250 mL/hr over 120 Minutes Intravenous NOW 04/25/14 2028 04/25/14 2322   04/25/14 1400  vancomycin  (VANCOCIN) 2,000 mg in sodium chloride 0.9 % 500 mL IVPB  Status:  Discontinued     2,000 mg 250 mL/hr over 120 Minutes Intravenous  Once 04/25/14 1356 04/25/14 1515   04/25/14 1400  piperacillin-tazobactam (ZOSYN) IVPB 3.375 g     3.375 g 100 mL/hr over 30 Minutes Intravenous  Once 04/25/14 1356 04/25/14 1624      Assessment: 73 YOF who continues on Vancomycin + Zosyn for 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 ID on board - Zosyn to continue x7d for Klebsiella UTI (sensitive to several abx) and Vancomycin to continue for now while awaiting culture results. Per ID note, CoNS growing.   A Vancomycin trough today drawn~1 hour late was SUBtherapeutic at 11.4 mcg/ml - an estimated true trough is likely still low at ~12 mcg/ml. Renal function stable - will increase Vancomycin dose today and will keep at bid interval to help with ease of administration if patient needed to be discharged home on it.   Goal of Therapy:  Vancomycin trough level 15-20 mcg/ml  Plan:  1. Increase Vancomycin to 1250 mg IV every 12 hours 2. Continue Zosyn 3.375g IV every 8 hours 3. Will continue to follow renal function, culture results, LOT, and antibiotic de-escalation plans   Alycia Rossetti, PharmD, BCPS Clinical Pharmacist Pager: [phone] 04/28/2014 1:21 PM

## 2014-04-28 NOTE — Discharge Instructions (Addendum)
Change your dressing daily with 4x4 gauze, Kerlix and ace wrap.  You may bear weight in the CAM boot.  You may remove the boot and dressing to shower, but don't soak the foot.  You may remove the boot to use the knee scooter as needed.  Follow with Primary MD Gar Ponto, MD in 7 days   Get CBC, CMP, checked 7 days by Primary MD and again as instructed by your Primary MD.    Activity: As tolerated with Full fall precautions use walker/cane & assistance as needed  Accuchecks 4 times/day, Once in AM empty stomach and then before each meal. Log in all results and show them to your Prim.MD in 3 days. If any glucose reading is under 80 or above 300 call your Prim MD immidiately. Follow Low glucose instructions for glucose under 80 as instructed.   Disposition Home    Diet: Heart Healthy Low Carb   For Heart failure patients - Check your Weight same time everyday, if you gain over 2 pounds, or you develop in leg swelling, experience more shortness of breath or chest pain, call your Primary MD immediately. Follow Cardiac Low Salt Diet and 1.8 lit/day fluid restriction.   On your next visit with her primary care physician please Get Medicines reviewed and adjusted.  Please request your Prim.MD to go over all Hospital Tests and Procedure/Radiological results at the follow up, please get all Hospital records sent to your Prim MD by signing hospital release before you go home.   If you experience worsening of your admission symptoms, develop shortness of breath, life threatening emergency, suicidal or homicidal thoughts you must seek medical attention immediately by calling 911 or calling your MD immediately  if symptoms less severe.  You Must read complete instructions/literature along with all the possible adverse reactions/side effects for all the Medicines you take and that have been prescribed to you. Take any new Medicines after you have completely understood and accpet all the possible  adverse reactions/side effects.   Do not drive and provide baby sitting services if your were admitted for syncope or siezures until you have seen by Primary MD or a Neurologist and advised to do so again.  Do not drive when taking Pain medications.    Do not take more than prescribed Pain, Sleep and Anxiety Medications  Special Instructions: If you have smoked or chewed Tobacco  in the last 2 yrs please stop smoking, stop any regular Alcohol  and or any Recreational drug use.  Wear Seat belts while driving.   Please note  You were cared for by a hospitalist during your hospital stay. If you have any questions about your discharge medications or the care you received while you were in the hospital after you are discharged, you can call the unit and asked to speak with the hospitalist on call if the hospitalist that took care of you is not available. Once you are discharged, your primary care physician will handle any further medical issues. Please note that NO REFILLS for any discharge medications will be authorized once you are discharged, as it is imperative that you return to your primary care physician (or establish a relationship with a primary care physician if you do not have one) for your aftercare needs so that they can reassess your need for medications and monitor your lab values.

## 2014-04-28 NOTE — Progress Notes (Signed)
PT Cancellation Note  Patient Details Name: Kathryn Ryan MRN: [id number] DOB: 1941-08-04   Cancelled Treatment:    Reason Eval/Treat Not Completed: Other (comment). Awaiting CAM walker for OOB activities.    [place], [place] 04/28/2014, 8:49 AM

## 2014-04-28 NOTE — Progress Notes (Signed)
Patient Demographics  Kathryn Ryan, is a 73 y.o. female, DOB - [birth year], [id number]  Admit date - 04/25/2014   Admitting Physician Verlee Monte, MD  Outpatient Primary MD for the patient is Gar Ponto, MD  LOS - 3   Chief Complaint  Patient presents with  . 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 Wylene Simmer on 04/26/2014, on empiric vancomycin and Zosyn. ID following. Continue to follow cultures negative till date. Weight bearing per orthopedics, patient has a PICC line in the right arm. Will defer duration and course of antibiotics per infectious disease. May need placement. S work consulted.    UTI?   follow urine cultures.    Hyperglycemia  Patient has no documented history of DM. Newly diagnosed type 2 diabetes mellitus this admission. On SSI , added Lantus for better control will increase to 20 U on 04-28-14.    Lab Results  Component Value Date   HGBA1C 7.0* 04/25/2014    CBG (last 3)   Recent Labs  04/27/14 1705 04/27/14 2201 04/28/14 0741  GLUCAP 168* 210* 182*      Hypothyroid  Will continue Synthroid     HTN  Holding HCTZ.  Will continue Lisinopril.       Neuropathy  Has a history of claudication with 2 spinal surgeries  Continue Lyrica   .  GERD  Continue PPI    Low K - replcaed.      Code Status: Full  Family Communication: none  present  Disposition Plan: TBD   Procedures    04/26/2014 by Dr. Doran Durand   1. Removal of deep implants from talonavicular joint medially  2. Irrigation and debridement of left foot abscesses in multiple locations  3. Removal of deep implants from calcaneocuboid joint laterally (separate incision)  4. Left ankle arthrotomy with irrigation and debridement    Consults  Otho, ID   Medications  Scheduled Meds: . docusate sodium  200 mg Oral BID  . heparin subcutaneous  5,000 Units Subcutaneous 3 times per day  . HYDROcodone-acetaminophen  2 tablet Oral QHS  . insulin aspart  0-9 Units Subcutaneous TID WC  . insulin glargine  15 Units Subcutaneous Daily  . levothyroxine  100 mcg Oral QAC breakfast  . lisinopril  10 mg Oral Daily  . pantoprazole  40 mg Oral Daily  . piperacillin-tazobactam (ZOSYN)  IV  3.375 g Intravenous 3 times per day  . polyethylene glycol  17 g Oral BID  . potassium chloride  40 mEq Oral Q4H  . pregabalin  150 mg Oral BID  . senna  1 tablet Oral BID  . sodium chloride  10-40 mL Intracatheter Q12H  . vancomycin  1,000 mg Intravenous Q12H   Continuous Infusions:   PRN Meds:.acetaminophen, acetaminophen, alum & mag hydroxide-simeth, hydrALAZINE, ibuprofen, metoprolol, morphine injection, ondansetron (ZOFRAN) IV, ondansetron, oxyCODONE, sodium chloride  DVT  Prophylaxis Heparin  Lab Results  Component Value Date   PLT 378 04/28/2014    Antibiotics     Anti-infectives   Start     Dose/Rate Route Frequency Ordered Stop   04/26/14 1000  vancomycin (VANCOCIN) IVPB 1000 mg/200 mL premix     1,000 mg 200 mL/hr over 60 Minutes Intravenous Every 12 hours 04/25/14 2028     04/25/14 2200  piperacillin-tazobactam (ZOSYN) IVPB 3.375 g     3.375 g 12.5 mL/hr over 240 Minutes Intravenous 3 times per day 04/25/14 2028     04/25/14 2100  vancomycin (VANCOCIN) 2,000 mg in sodium chloride 0.9 % 500 mL IVPB     2,000 mg 250 mL/hr over 120 Minutes Intravenous NOW  04/25/14 2028 04/25/14 2322   04/25/14 1400  vancomycin (VANCOCIN) 2,000 mg in sodium chloride 0.9 % 500 mL IVPB  Status:  Discontinued     2,000 mg 250 mL/hr over 120 Minutes Intravenous  Once 04/25/14 1356 04/25/14 1515   04/25/14 1400  piperacillin-tazobactam (ZOSYN) IVPB 3.375 g     3.375 g 100 mL/hr over 30 Minutes Intravenous  Once 04/25/14 1356 04/25/14 1624          Subjective:   Kathryn Ryan today has, mild generalised headache, No chest pain, No abdominal pain - No Nausea, No new weakness tingling or numbness, No Cough - SOB. Nopain in the left ankle .  Objective:   Filed Vitals:   04/27/14 [id number] 04/27/14 1426 04/27/14 2143 04/28/14 0641  BP: 154/77 120/51 135/56 152/64  Pulse: 108 94 86 95  Temp: 98.5 F (36.9 C) 98.4 F (36.9 C) 98.6 F (37 C) 98.1 F (36.7 C)  TempSrc:  Oral Oral Oral  Resp: 17 18 20 18   Height:      Weight:      SpO2: 94% 93% 94% 97%    Wt Readings from Last 3 Encounters:  04/25/14 106.595 kg (235 lb)  04/25/14 106.595 kg (235 lb)  02/27/12 107.1 kg (236 lb 1.8 oz)     Intake/Output Summary (Last 24 hours) at 04/28/14 0759 Last data filed at 04/27/14 2100  Gross per 24 hour  Intake   1730 ml  Output     13 ml  Net   1717 ml     Physical Exam  Awake Alert, Oriented X 3, No new F.N deficits, Normal affect [place].AT,PERRAL Supple Neck,No JVD, No cervical lymphadenopathy appriciated.  Symmetrical Chest wall movement, Good air movement bilaterally, CTAB RRR,No Gallops,Rubs or new Murmurs, No Parasternal Heave +ve B.Sounds, Abd Soft, Non tender, No organomegaly appriciated, No rebound - guarding or rigidity. No Cyanosis, Clubbing or edema, No new Rash or bruise, Left ankle under bandage with drain in place.   Data Review   Micro Results Recent Results (from the past 240 hour(s))  URINE CULTURE     Status: None   Collection Time    04/25/14 12:55 PM      Result Value Ref Range Status   Specimen Description URINE, CLEAN CATCH    Final   Special Requests NONE   Final   Culture  Setup Time     Final   Value: 04/25/2014 19:34     Performed at [facility]     Final   Value: >=100,000 COLONIES/ML     Performed at [facility]   Culture     Final   Value: [place]     Performed at [facility]   Report Status PENDING   Incomplete  CULTURE, BLOOD (ROUTINE X 2)     Status: None   Collection Time    04/25/14  4:22 PM      Result Value Ref Range Status   Specimen Description BLOOD RIGHT ARM   Final   Special Requests BOTTLES DRAWN AEROBIC AND ANAEROBIC 10CC   Final   Culture  Setup Time     Final   Value: 04/25/2014 20:01     Performed at [facility]   Culture     Final   Value:        BLOOD CULTURE RECEIVED NO GROWTH TO DATE CULTURE WILL BE HELD FOR 5 DAYS BEFORE ISSUING A FINAL NEGATIVE REPORT     Performed at [facility]   Report Status PENDING   Incomplete  CULTURE, BLOOD (ROUTINE X 2)     Status: None   Collection Time    04/25/14  4:39 PM      Result Value Ref Range Status   Specimen Description BLOOD ARM LEFT   Final   Special Requests BOTTLES DRAWN AEROBIC AND ANAEROBIC 5CC   Final   Culture  Setup Time     Final   Value: 04/25/2014 23:20     Performed at [facility]   Culture     Final   Value:        BLOOD CULTURE RECEIVED NO GROWTH TO DATE CULTURE WILL BE HELD FOR 5 DAYS BEFORE ISSUING A FINAL NEGATIVE REPORT     Performed at [facility]   Report Status PENDING   Incomplete  BODY FLUID CULTURE     Status: None   Collection Time    04/25/14  8:03 PM      Result Value Ref Range Status   Specimen Description SYNOVIAL FLUID ANKLE LEFT   Final   Special Requests Normal   Final   Gram Stain     Final   Value: FEW WBC PRESENT,BOTH PMN AND MONONUCLEAR     NO ORGANISMS SEEN     Performed at [facility]   Culture     Final   Value: NO GROWTH 1 DAY     Performed at [facility]   Report Status  PENDING   Incomplete  URINE CULTURE     Status: None   Collection Time    04/25/14  8:38 PM      Result Value Ref Range Status   Specimen Description URINE, RANDOM   Final   Special Requests NONE   Final   Culture  Setup Time     Final   Value: 04/26/2014 03:25     Performed at [facility]     Final   Value: NO GROWTH     Performed at [facility]   Culture     Final   Value: NO GROWTH     Performed at [facility]   Report Status 04/26/2014 FINAL   Final  SURGICAL PCR SCREEN     Status: None   Collection Time    04/26/14  5:51 AM      Result Value Ref Range Status   MRSA, PCR NEGATIVE  NEGATIVE Final   Staphylococcus aureus NEGATIVE  NEGATIVE Final   Comment:            The Xpert SA Assay (FDA     approved for NASAL specimens  in patients over 64 years of age),     is one component of     a comprehensive surveillance     program.  Test performance has     been validated by [employer] for patients greater     than or equal to 11 year old.     It is not intended     to diagnose infection nor to     guide or monitor treatment.  ANAEROBIC CULTURE     Status: None   Collection Time    04/26/14  2:03 PM      Result Value Ref Range Status   Specimen Description WOUND ANKLE LEFT   Final   Special Requests PATIENT ON FOLLOWING VANCOMYCIN   Final   Gram Stain     Final   Value: NO WBC SEEN     NO ORGANISMS SEEN     Performed at [facility]   Culture     Final   Value: NO ANAEROBES ISOLATED; CULTURE IN PROGRESS FOR 5 DAYS     Performed at [facility]   Report Status PENDING   Incomplete  TISSUE CULTURE     Status: None   Collection Time    04/26/14  2:03 PM      Result Value Ref Range Status   Specimen Description TISSUE LEFT ANKLE   Final   Special Requests PATIENT ON FOLLOWING VANCOMYCIN   Final   Gram Stain     Final   Value: NO WBC SEEN     NO ORGANISMS SEEN     Performed at [facility]    Culture PENDING   Incomplete   Report Status PENDING   Incomplete    Radiology Reports Dg Ankle Complete Left  04/25/2014   CLINICAL DATA:  Wound infection  EXAM: LEFT ANKLE COMPLETE - 3+ VIEW  COMPARISON:  None.  FINDINGS: Frontal, oblique, and lateral views were obtained. Two screws transfix with the hindfoot, including the anterior subtalar joint from a medial approach. Both screws have fractures in their respective mid portions.  There is marked soft tissue swelling throughout the ankle joint with foci of soft tissue air medially concerning for abscess. No acute fracture is seen. The ankle mortise appears intact. There is fluid in the joint. 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 There also surgical screws transfixing the calcaneocuboid joint region.  IMPRESSION: Marked soft tissue swelling with air medially. Suspect abscess within the soft tissues surrounding the ankle and hindfoot. There is postoperative change with 2 screws transfixing the anterior subtalar joint. There are fractures in the mid portions of each screw. There are also screws to the calcaneocuboid joint. The screws do not appear fractured. No acute fracture. Joint effusion which could potentially represent infection within the joint.  Given these findings, it may be prudent to consider contrast enhanced CT to further evaluate.  Critical Value/emergent results were called by telephone at the time of interpretation on 04/25/2014 at 1:33 PM to Dr. Francine Graven , who verbally acknowledged these results.   Electronically Signed   By: Lowella Grip M.D.   On: 04/25/2014 13:34   Mr Ankle Left W Wo Contrast  04/25/2014   CLINICAL DATA:  Left foot pain and swelling. History of prior surgery.  EXAM: MRI OF THE LEFT ANKLE WITHOUT AND WITH CONTRAST  TECHNIQUE: Multiplanar, multisequence MR imaging of the ankle was performed before and after the administration of intravenous contrast.  CONTRAST:  1 MULTIHANCE GADOBENATE DIMEGLUMINE 529  MG/ML IV SOLN  COMPARISON:  Radiographs 04/25/2014  FINDINGS: Examination is limited by patient motion and artifact from hardware.  Hardware is noted in the midfoot with fractured screws noted on the radiographs. There is diffuse soft tissue swelling/edema and multiple rim enhancing subcutaneous fluid collections suspicious for abscesses. The largest cyst along the lateral malleolus. There is also a large joint effusion involving the tibiotalar joint with thick rim enhancing synovium. Could not exclude septic arthritis. Recommend joint aspiration. It is difficult to evaluate for osteomyelitis because of the significant artifact. Severe talonavicular joint degenerative changes are noted with moderate subluxation. Rim enhancing joint effusion is noted here also.  IMPRESSION: IMPRESSION Medial and lateral soft tissue abscesses.  Joint effusions and marked synovial enhancement worrisome for septic arthritis. This mainly involves the tibiotalar joint and talonavicular joint.  Limited examination for osteomyelitis due to artifact and patient motion.   Electronically Signed   By: Kalman Jewels M.D.   On: 04/25/2014 16:36   Dg Fluoro Guide Ndl Plc/bx  04/25/2014   CLINICAL DATA:  26-YEAR-OLD FEMALE WITH SUSPECTED SEPTIC LEFT ANKLE.  EXAM: LEFT ANKLE JOINT ASPIRATION  TECHNIQUE: Written informed consent was obtained from the patient following explanation of the risks, benefits and alternatives. The ankle was prepped and draped in the usual fashion using Betadine skin prep. Localization was performed under fluoroscopy. A 20 gauge spinal needle was advanced into the tibiotalar joint and a small volume of turbid bloody fluid was aspirated. An 18 gauge needle was also advanced into an area of fluctuance overlying the lateral malleolus. Approximately 1 cc of purulent fluid was successfully aspirated.  Hemostasis was attained by gentle manual pressure and Band-Aids were applied. The patient tolerated the procedure well. There  was no immediate complication.  FLUOROSCOPY TIME:  37 seconds  COMPARISON:  Ankle MRI 04/25/2014  FINDINGS: Successful aspiration of a small amount of purulent/bloody fluid from the tibiotalar joint and fluid overlying the lateral malleolus.  IMPRESSION: Successful aspiration of a small amount of purulent/bloody fluid from the tibiotalar joint and fluid overlying the lateral malleolus.  Fluid sent for Gram stain and culture.   Electronically Signed   By: Jacqulynn Cadet M.D.   On: 04/25/2014 21:26    CBC  Recent Labs Lab 04/25/14 1229 04/26/14 0525 04/28/14 0530  WBC 9.6 9.2 7.7  HGB 10.2* 11.0* 10.3*  HCT 30.9* 34.0* 32.2*  PLT 315 340 378  MCV 91.4 94.4 94.2  MCH 30.2 30.6 30.1  MCHC 33.0 32.4 32.0  RDW 15.2 15.3 15.5  LYMPHSABS 1.2  --   --   MONOABS 0.8  --   --   EOSABS 0.0  --   --   BASOSABS 0.0  --   --     Chemistries   Recent Labs Lab 04/25/14 1229 04/26/14 0525 04/28/14 0530  NA 134* 143 142  K 3.5* 4.2 3.4*  CL 92* 98 99  CO2 30 33* 31  GLUCOSE 238* 203* 172*  BUN 16 17 14   CREATININE 0.82 0.84 0.88  CALCIUM 9.1 9.7 8.6  AST 15  --   --   ALT 12  --   --   ALKPHOS 142*  --   --   BILITOT 0.3  --   --    ------------------------------------------------------------------------------------------------------------------ estimated creatinine clearance is 71.3 ml/min (by C-G formula based on Cr of 0.88). ------------------------------------------------------------------------------------------------------------------  Recent Labs  04/25/14 1229  HGBA1C 7.0*   ------------------------------------------------------------------------------------------------------------------ No results found for  this basename: CHOL, HDL, LDLCALC, TRIG, CHOLHDL, LDLDIRECT,  in the last 72 hours ------------------------------------------------------------------------------------------------------------------ No results found for this basename: TSH, T4TOTAL, FREET3, T3FREE,  THYROIDAB,  in the last 72 hours ------------------------------------------------------------------------------------------------------------------ No results found for this basename: VITAMINB12, FOLATE, FERRITIN, TIBC, IRON, RETICCTPCT,  in the last 72 hours  Coagulation profile No results found for this basename: INR, PROTIME,  in the last 168 hours  No results found for this basename: DDIMER,  in the last 72 hours  Cardiac Enzymes No results found for this basename: CK, CKMB, TROPONINI, MYOGLOBIN,  in the last 168 hours ------------------------------------------------------------------------------------------------------------------ No components found with this basename: POCBNP,      Time Spent in minutes   35   Thurnell Lose M.D on 04/28/2014 at 7:59 AM  Between 7am to 7pm - Pager - [phone]  After 7pm go to [email or web address] - password [email or web address]  And look for the night coverage person covering for me after hours  [facility] Office  [phone]

## 2014-04-28 NOTE — Progress Notes (Signed)
Subjective: 2 Days Post-Op Procedure(s) (LRB): HARDWARE REMOVAL LEFT ANKLE (Left) IRRIGATION AND DEBRIDEMENT LEFT ANKLE (Left) Patient reports pain as mild.  No n/v/f/c.  OOB in cam boot on L LE.  Objective: Vital signs in last 24 hours: Temp:  [98.1 F (36.7 C)-98.6 F (37 C)] 98.1 F (36.7 C) (05/22 0641) Pulse Rate:  [86-95] 95 (05/22 0641) Resp:  [18-20] 18 (05/22 0641) BP: (120-152)/(51-64) 152/64 mmHg (05/22 0641) SpO2:  [93 %-97 %] 97 % (05/22 0641)  Intake/Output from previous day: 05/21 0701 - 05/22 0700 In: 1730 [P.O.:1080; I.V.:400; IV Piggyback:250] Out: 13 [Drains:13] Intake/Output this shift: Total I/O In: 360 [P.O.:360] Out: -    Recent Labs  04/26/14 0525 04/28/14 0530  HGB 11.0* 10.3*    Recent Labs  04/26/14 0525 04/28/14 0530  WBC 9.2 7.7  RBC 3.60* 3.42*  HCT 34.0* 32.2*  PLT 340 378    Recent Labs  04/26/14 0525 04/28/14 0530 04/28/14 1105  NA 143 142  --   K 4.2 3.4* 3.6*  CL 98 99  --   CO2 33* 31  --   BUN 17 14  --   CREATININE 0.84 0.88  --   GLUCOSE 203* 172*  --   CALCIUM 9.7 8.6  --    No results found for this basename: LABPT, INR,  in the last 72 hours  Ankle is less swollen.  No erythema.  No purulnce in drains.  Incisions healign well.  Assessment/Plan: 2 Days Post-Op Procedure(s) (LRB): HARDWARE REMOVAL LEFT ANKLE (Left) IRRIGATION AND DEBRIDEMENT LEFT ANKLE (Left) Drains removed.  Dressing changed.  Continue WBAT in cam boot. IV abx per Dr. Johnnye Sima.  F/u with me in clinic in 7-10 days if pt discharged over the weekend.  Wylene Simmer 04/28/2014, 1:07 PM

## 2014-04-28 NOTE — Clinical Social Work Note (Signed)
CSW received consult for possible SNF placement at time of discharge pending PT/OT recommendations. CSW now following. Thank you for the referral.   Pati Gallo, [place] Social Worker  [phone]

## 2014-04-28 NOTE — Progress Notes (Signed)
INFECTIOUS DISEASE PROGRESS NOTE  ID: Kathryn Ryan is a 73 y.o. 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  Subjective: Without complaints  Abtx:  Anti-infectives   Start     Dose/Rate Route Frequency Ordered Stop   04/26/14 1000  vancomycin (VANCOCIN) IVPB 1000 mg/200 mL premix     1,000 mg 200 mL/hr over 60 Minutes Intravenous Every 12 hours 04/25/14 2028     04/25/14 2200  piperacillin-tazobactam (ZOSYN) IVPB 3.375 g     3.375 g 12.5 mL/hr over 240 Minutes Intravenous 3 times per day 04/25/14 2028     04/25/14 2100  vancomycin (VANCOCIN) 2,000 mg in sodium chloride 0.9 % 500 mL IVPB     2,000 mg 250 mL/hr over 120 Minutes Intravenous NOW 04/25/14 2028 04/25/14 2322   04/25/14 1400  vancomycin (VANCOCIN) 2,000 mg in sodium chloride 0.9 % 500 mL IVPB  Status:  Discontinued     2,000 mg 250 mL/hr over 120 Minutes Intravenous  Once 04/25/14 1356 04/25/14 1515   04/25/14 1400  piperacillin-tazobactam (ZOSYN) IVPB 3.375 g     3.375 g 100 mL/hr over 30 Minutes Intravenous  Once 04/25/14 1356 04/25/14 1624      Medications:  Scheduled: . docusate sodium  200 mg Oral BID  . heparin subcutaneous  5,000 Units Subcutaneous 3 times per day  . HYDROcodone-acetaminophen  2 tablet Oral QHS  . insulin aspart  0-9 Units Subcutaneous TID WC  . insulin glargine  20 Units Subcutaneous Daily  . levothyroxine  100 mcg Oral QAC breakfast  . lisinopril  10 mg Oral Daily  . pantoprazole  40 mg Oral Daily  . piperacillin-tazobactam (ZOSYN)  IV  3.375 g Intravenous 3 times per day  . polyethylene glycol  17 g Oral BID  . potassium chloride  40 mEq Oral Q4H  . pregabalin  150 mg Oral BID  . senna  1 tablet Oral BID  . sodium chloride  10-40 mL Intracatheter Q12H  . vancomycin  1,000 mg Intravenous Q12H    Objective: Vital signs in last 24  hours: Temp:  [98.1 F (36.7 C)-98.6 F (37 C)] 98.1 F (36.7 C) (05/22 0641) Pulse Rate:  [86-95] 95 (05/22 0641) Resp:  [18-20] 18 (05/22 0641) BP: (120-152)/(51-64) 152/64 mmHg (05/22 0641) SpO2:  [93 %-97 %] 97 % (05/22 0641)   General appearance: alert, cooperative and no distress Incision/Wound: L foot wound dressed, clean.   Lab Results  Recent Labs  04/26/14 0525 04/28/14 0530  WBC 9.2 7.7  HGB 11.0* 10.3*  HCT 34.0* 32.2*  NA 143 142  K 4.2 3.4*  CL 98 99  CO2 33* 31  BUN 17 14  CREATININE 0.84 0.88   Liver Panel  Recent Labs  04/25/14 1229  PROT 7.0  ALBUMIN 2.4*  AST 15  ALT 12  ALKPHOS 142*  BILITOT 0.3   Sedimentation Rate No results found for this basename: ESRSEDRATE,  in the last 72 hours C-Reactive Protein No results found for this basename: CRP,  in the last 72 hours  Microbiology: Recent Results (from the past 240 hour(s))  URINE CULTURE     Status: None   Collection Time    04/25/14 12:55 PM      Result Value Ref Range Status   Specimen Description URINE, [place]  Final   Special Requests NONE   Final   Culture  Setup Time     Final   Value: 04/25/2014 19:34     Performed at [facility]     Final   Value: >=100,000 COLONIES/ML     Performed at [facility]   Culture     Final   Value: KLEBSIELLA PNEUMONIAE     Performed at [facility]   Report Status 04/28/2014 FINAL   Final   Organism ID, Bacteria KLEBSIELLA PNEUMONIAE   Final  CULTURE, BLOOD (ROUTINE X 2)     Status: None   Collection Time    04/25/14  4:22 PM      Result Value Ref Range Status   Specimen Description BLOOD RIGHT ARM   Final   Special Requests BOTTLES DRAWN AEROBIC AND ANAEROBIC 10CC   Final   Culture  Setup Time     Final   Value: 04/25/2014 20:01     Performed at [facility]   Culture     Final   Value:        BLOOD CULTURE RECEIVED NO GROWTH TO DATE CULTURE WILL BE HELD FOR 5 DAYS BEFORE ISSUING A  FINAL NEGATIVE REPORT     Performed at [facility]   Report Status PENDING   Incomplete  CULTURE, BLOOD (ROUTINE X 2)     Status: None   Collection Time    04/25/14  4:39 PM      Result Value Ref Range Status   Specimen Description BLOOD ARM LEFT   Final   Special Requests BOTTLES DRAWN AEROBIC AND ANAEROBIC 5CC   Final   Culture  Setup Time     Final   Value: 04/25/2014 23:20     Performed at [facility]   Culture     Final   Value:        BLOOD CULTURE RECEIVED NO GROWTH TO DATE CULTURE WILL BE HELD FOR 5 DAYS BEFORE ISSUING A FINAL NEGATIVE REPORT     Performed at [facility]   Report Status PENDING   Incomplete  BODY FLUID CULTURE     Status: None   Collection Time    04/25/14  8:03 PM      Result Value Ref Range Status   Specimen Description SYNOVIAL FLUID ANKLE LEFT   Final   Special Requests Normal   Final   Gram Stain     Final   Value: FEW WBC PRESENT,BOTH PMN AND MONONUCLEAR     NO ORGANISMS SEEN     Performed at [facility]   Culture     Final   Value: NO GROWTH 2 DAYS     Performed at [facility]   Report Status PENDING   Incomplete  URINE CULTURE     Status: None   Collection Time    04/25/14  8:38 PM      Result Value Ref Range Status   Specimen Description URINE, RANDOM   Final   Special Requests NONE   Final   Culture  Setup Time     Final   Value: 04/26/2014 03:25     Performed at [facility]     Final   Value: NO GROWTH     Performed at [facility]   Culture     Final   Value: NO GROWTH     Performed  at [facility]   Report Status 04/26/2014 FINAL   Final  SURGICAL PCR SCREEN     Status: None   Collection Time    04/26/14  5:51 AM      Result Value Ref Range Status   MRSA, PCR NEGATIVE  NEGATIVE Final   Staphylococcus aureus NEGATIVE  NEGATIVE Final   Comment:            The Xpert SA Assay (FDA     approved for NASAL specimens     in patients over 21 years of  age),     is one component of     a comprehensive surveillance     program.  Test performance has     been validated by [employer] for patients greater     than or equal to 10 year old.     It is not intended     to diagnose infection nor to     guide or monitor treatment.  ANAEROBIC CULTURE     Status: None   Collection Time    04/26/14  2:03 PM      Result Value Ref Range Status   Specimen Description WOUND ANKLE LEFT   Final   Special Requests PATIENT ON FOLLOWING VANCOMYCIN   Final   Gram Stain     Final   Value: NO WBC SEEN     NO ORGANISMS SEEN     Performed at [facility]   Culture     Final   Value: NO ANAEROBES ISOLATED; CULTURE IN PROGRESS FOR 5 DAYS     Performed at [facility]   Report Status PENDING   Incomplete  TISSUE CULTURE     Status: None   Collection Time    04/26/14  2:03 PM      Result Value Ref Range Status   Specimen Description TISSUE LEFT ANKLE   Final   Special Requests PATIENT ON FOLLOWING VANCOMYCIN   Final   Gram Stain     Final   Value: NO WBC SEEN     NO ORGANISMS SEEN     Performed at [facility]     Final   Value: Culture reincubated for better growth     Performed at [facility]   Report Status PENDING   Incomplete    Studies/Results: No results found.   Assessment/Plan: Osteomyelitis L foot/ankle  DM2 (new)  UTI- kleb pneumo  Total days of antibiotics 4 (zosyn/vanco)  Her Cx is growing CNS Await further ID Will stop Zosyn at day 7 (for her +UCx) Attempted to answer multiple questions- will I lose my foot, have you seen pt's like this before, how long will I be on anbx, when can I have surgery... Will be available over weekend if needed (I will f/u her Cx).         [facility] Infectious [facility] (pager) [phone] [email or web address] 04/28/2014, 11:42 AM  LOS: 3 days   **Disclaimer: This note may have been dictated with voice recognition software. Similar  sounding words can inadvertently be transcribed and this note may contain transcription errors which may not have been corrected upon publication of note.**

## 2014-04-29 DIAGNOSIS — L02419 Cutaneous abscess of limb, unspecified: Secondary | ICD-10-CM | POA: Diagnosis not present

## 2014-04-29 LAB — BODY FLUID CULTURE
Culture: NO GROWTH
Special Requests: NORMAL

## 2014-04-29 LAB — GLUCOSE, CAPILLARY
Glucose-Capillary: 165 mg/dL — ABNORMAL HIGH (ref 70–99)
Glucose-Capillary: 165 mg/dL — ABNORMAL HIGH (ref 70–99)
Glucose-Capillary: 138 mg/dL — ABNORMAL HIGH (ref 70–99)
Glucose-Capillary: 159 mg/dL — ABNORMAL HIGH (ref 70–99)

## 2014-04-29 LAB — MAGNESIUM: Magnesium: 2.1 mg/dL (ref 1.5–2.5)

## 2014-04-29 MED ORDER — INSULIN GLARGINE 100 UNIT/ML ~~LOC~~ SOLN
24.0000 [IU] | Freq: Every day | SUBCUTANEOUS | Status: DC
  Administered 2014-04-29 – 2014-05-02 (×4): 24 [IU] via SUBCUTANEOUS
  Filled 2014-04-29 (×4): qty 0.24

## 2014-04-29 MED ORDER — DIPHENHYDRAMINE HCL 50 MG/ML IJ SOLN
25.0000 mg | Freq: Three times a day (TID) | INTRAMUSCULAR | Status: DC | PRN
  Administered 2014-04-29 – 2014-04-30 (×3): 25 mg via INTRAVENOUS
  Filled 2014-04-29 (×3): qty 1

## 2014-04-29 NOTE — Progress Notes (Signed)
Physical Therapy Treatment Patient Details Name: Kathryn Ryan MRN: [id number] DOB: 28-Aug-1941 Today's Date: 04/29/2014    History of Present Illness Pt is a 73 y.o. 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     PT Comments    Pt progressing mobility during session today. Attempted to ambulate with knee walker but unable to at this time due to CAM walker. Will cont to follow per POC. Patient needs to practice stairs next session.     Follow Up Recommendations  Outpatient PT;Supervision - Intermittent     Equipment Recommendations  None recommended by PT    Recommendations for Other Services OT consult     Precautions / Restrictions Precautions Precautions: None Required Braces or Orthoses: Other Brace/Splint Other Brace/Splint: CAM walker  Restrictions Weight Bearing Restrictions: Yes LLE Weight Bearing: Weight bearing as tolerated (with CAM walker )    Mobility  Bed Mobility               General bed mobility comments: not addressed; pt in recliner and returned to recliner  Transfers Overall transfer level: Needs assistance Equipment used: Rolling walker (2 wheeled) Transfers: Sit to/from Stand Sit to Stand: Min guard         General transfer comment: min guard for transfers from recliner and from [facility] in bathroom; cues for hand placement and sequencing   Ambulation/Gait Ambulation/Gait assistance: Supervision Ambulation Distance (Feet): 200 Feet Assistive device: Rolling walker (2 wheeled) Gait Pattern/deviations: Decreased stance time - left;Decreased step length - right;Antalgic;Trunk flexed;Wide base of support Gait velocity: decreased Gait velocity interpretation: Below normal speed for age/gender General Gait Details: pt progressed mobility with RW; attempted to use knee walker but unable to due to CAM walker; progressing to supervision level for gt for safety only; pt fatigued with ambulation and required 2  standing rest breaks; cues for upright posture throughout    Stairs Stairs:  (plan to assess next visit )          Wheelchair Mobility    Modified Rankin (Stroke Patients Only)       Balance Overall balance assessment: Needs assistance Sitting-balance support: Feet supported;No upper extremity supported Sitting balance-Leahy Scale: Good     Standing balance support: During functional activity;Single extremity supported Standing balance-Leahy Scale: Fair Standing balance comment: min guard for pericare with single UE supported by RW                     Cognition Arousal/Alertness: Awake/alert Behavior During Therapy: WFL for tasks assessed/performed Overall Cognitive Status: Within Functional Limits for tasks assessed                      Exercises      General Comments General comments (skin integrity, edema, etc.): discussed stair management techniques verbally; will plan to address tomorrow       Pertinent Vitals/Pain 2/10 pain. Premedicated per pt     Home Living                      Prior Function            PT Goals (current goals can now be found in the care plan section) Acute Rehab PT Goals Patient Stated Goal: to go home this weekend  PT Goal Formulation: With patient Time For Goal Achievement: 05/02/14 Potential to Achieve Goals: Good Progress towards PT goals: Progressing toward goals    Frequency  Min 4X/week    PT Plan Current plan remains appropriate    Co-evaluation             End of Session Equipment Utilized During Treatment: Gait belt;Other (comment) (CAM walker ) Activity Tolerance: Patient tolerated treatment well Patient left: in chair;with call bell/phone within reach     Time: 0272-5366 PT Time Calculation (min): 47 min  Charges:  $Gait Training: 23-37 mins                    G CodesKennis Carina [place] ,[place]  [phone] 04/29/2014, 3:33 PM

## 2014-04-29 NOTE — Progress Notes (Signed)
Subjective: 3 Days Post-Op Procedure(s) (LRB): HARDWARE REMOVAL LEFT ANKLE (Left) IRRIGATION AND DEBRIDEMENT LEFT ANKLE (Left) Patient reports pain as 2 on 0-10 scale.  Up in Chair and no new problems. Dr.Hewitt will see tomorrow. Afebrile.WBC 7.7  Objective: Vital signs in last 24 hours: Temp:  [97.6 F (36.4 C)-98.4 F (36.9 C)] 97.6 F (36.4 C) (05/23 0631) Pulse Rate:  [89-96] 89 (05/23 0631) Resp:  [16-18] 18 (05/23 0631) BP: (131-150)/(55-67) 145/67 mmHg (05/23 0631) SpO2:  [94 %-97 %] 94 % (05/23 0631)  Intake/Output from previous day: 05/22 0701 - 05/23 0700 In: 970 [P.O.:960; I.V.:10] Out: 10 [Drains:10] Intake/Output this shift:     Recent Labs  04/28/14 0530  HGB 10.3*    Recent Labs  04/28/14 0530  WBC 7.7  RBC 3.42*  HCT 32.2*  PLT 378    Recent Labs  04/28/14 0530 04/28/14 1105  NA 142  --   K 3.4* 3.6*  CL 99  --   CO2 31  --   BUN 14  --   CREATININE 0.88  --   GLUCOSE 172*  --   CALCIUM 8.6  --    No results found for this basename: LABPT, INR,  in the last 72 hours  Up in chair and comfortable.  Assessment/Plan: 3 Days Post-Op Procedure(s) (LRB): HARDWARE REMOVAL LEFT ANKLE (Left) IRRIGATION AND DEBRIDEMENT LEFT ANKLE (Left) Up with therapy  Tobi Bastos 04/29/2014, 9:22 AM

## 2014-04-29 NOTE — Progress Notes (Signed)
Patient Demographics  Kathryn Ryan, is a 73 y.o. female, DOB - 01/17/1941, [id number]  Admit date - 04/25/2014   Admitting Physician Verlee Monte, MD  Outpatient Primary MD for the patient is Gar Ponto, MD  LOS - 4   Chief Complaint  Patient presents with  . 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 Wylene Simmer on 04/26/2014, on empiric vancomycin and Zosyn. ID following. Continue to follow cultures negative till date. Weight bearing per orthopedics, patient has a PICC line in the right arm. Will defer duration and course of antibiotics per infectious disease. May need placement. S. work consulted.    UTI?   NOTED urine cultures.    Hyperglycemia  Patient has no documented history of DM. Newly diagnosed type 2 diabetes mellitus this admission. On SSI , added Lantus for better control will increase to 24 U on 04-29-14.    Lab Results  Component Value Date   HGBA1C 7.0* 04/25/2014    CBG (last 3)   Recent Labs  04/28/14 1152 04/28/14 1712 04/28/14 2202  GLUCAP 190* 155* 30*      Hypothyroid  Will continue Synthroid     HTN  Holding HCTZ.  Will continue Lisinopril.       Neuropathy  Has a history of claudication with 2 spinal surgeries  Continue Lyrica   .  GERD  Continue PPI    Low K - replaced recheck in am, Mag stable.      Code Status: Full  Family Communication:  none present  Disposition Plan: TBD   Procedures    04/26/2014 by Dr. Doran Durand   1. Removal of deep implants from talonavicular joint medially  2. Irrigation and debridement of left foot abscesses in multiple locations  3. Removal of deep implants from calcaneocuboid joint laterally (separate incision)  4. Left ankle arthrotomy with irrigation and debridement    Consults  Otho, ID   Medications  Scheduled Meds: . docusate sodium  200 mg Oral BID  . heparin subcutaneous  5,000 Units Subcutaneous 3 times per day  . HYDROcodone-acetaminophen  2 tablet Oral QHS  . insulin aspart  0-9 Units Subcutaneous TID WC  . insulin glargine  20 Units Subcutaneous Daily  . levothyroxine  100 mcg Oral QAC breakfast  . lisinopril  10 mg Oral Daily  . pantoprazole  40 mg Oral Daily  . piperacillin-tazobactam (ZOSYN)  IV  3.375 g Intravenous 3 times per day  . polyethylene glycol  17 g Oral BID  . pregabalin  150 mg Oral BID  . senna  1 tablet Oral BID  . sodium chloride  10-40 mL Intracatheter Q12H  . vancomycin  1,250 mg Intravenous Q12H   Continuous Infusions:   PRN Meds:.acetaminophen, alum & mag hydroxide-simeth, hydrALAZINE, ibuprofen, metoprolol, morphine injection, ondansetron (ZOFRAN) IV, oxyCODONE, sodium chloride  DVT Prophylaxis Heparin  Lab Results  Component Value Date   PLT 378 04/28/2014    Antibiotics     Anti-infectives   Start     Dose/Rate Route Frequency Ordered Stop   04/28/14 1400  vancomycin (VANCOCIN) 1,250 mg in sodium chloride 0.9 % 250 mL IVPB     1,250 mg 166.7 mL/hr over 90 Minutes Intravenous Every 12 hours 04/28/14 1327     04/26/14 1000  vancomycin (VANCOCIN) IVPB 1000 mg/200 mL premix  Status:  Discontinued     1,000 mg 200 mL/hr over 60 Minutes Intravenous Every 12 hours 04/25/14 2028 04/28/14 1326   04/25/14 2200  piperacillin-tazobactam (ZOSYN) IVPB 3.375 g     3.375 g 12.5 mL/hr over 240 Minutes Intravenous 3 times per day 04/25/14 2028      04/25/14 2100  vancomycin (VANCOCIN) 2,000 mg in sodium chloride 0.9 % 500 mL IVPB     2,000 mg 250 mL/hr over 120 Minutes Intravenous NOW 04/25/14 2028 04/25/14 2322   04/25/14 1400  vancomycin (VANCOCIN) 2,000 mg in sodium chloride 0.9 % 500 mL IVPB  Status:  Discontinued     2,000 mg 250 mL/hr over 120 Minutes Intravenous  Once 04/25/14 1356 04/25/14 1515   04/25/14 1400  piperacillin-tazobactam (ZOSYN) IVPB 3.375 g     3.375 g 100 mL/hr over 30 Minutes Intravenous  Once 04/25/14 1356 04/25/14 1624          Subjective:   Kathryn Ryan today has, mild generalised headache, No chest pain, No abdominal pain - No Nausea, No new weakness tingling or numbness, No Cough - SOB. No pain in the left ankle, feels better.  Objective:   Filed Vitals:   04/28/14 1338 04/28/14 1443 04/28/14 2057 04/29/14 0631  BP: 150/62 134/56 131/55 145/67  Pulse: 96 96 90 89  Temp: 98.4 F (36.9 C)  98.2 F (36.8 C) 97.6 F (36.4 C)  TempSrc: Oral  Oral Axillary  Resp: 18  16 18   Height:      Weight:      SpO2: 97%  97% 94%    Wt Readings from Last 3 Encounters:  04/25/14 106.595 kg (235 lb)  04/25/14 106.595 kg (235 lb)  02/27/12 107.1 kg (236 lb 1.8 oz)     Intake/Output Summary (Last 24 hours) at 04/29/14 0831 Last data filed at 04/28/14 2221  Gross per 24 hour  Intake    970 ml  Output     10 ml  Net    960 ml     Physical Exam  Awake Alert, Oriented X 3, No new F.N deficits, Normal affect [place].AT,PERRAL Supple Neck,No JVD, No cervical lymphadenopathy appriciated.  Symmetrical Chest wall movement, Good air movement bilaterally, CTAB RRR,No Gallops,Rubs or new Murmurs, No Parasternal Heave +ve B.Sounds, Abd Soft, Non tender, No organomegaly appriciated, No rebound - guarding or rigidity. No Cyanosis, Clubbing or edema, No new Rash or bruise, Left ankle has a Cam walker boot, drains are out   Data Review   Micro Results Recent Results (from the past 240 hour(s))  URINE  CULTURE     Status: None   Collection Time    04/25/14 12:55 PM      Result Value Ref Range Status   Specimen Description URINE, CLEAN CATCH   Final   Special Requests NONE   Final   Culture  Setup Time     Final   Value: 04/25/2014 19:34     Performed at [place]  Final   Value: >=100,000 COLONIES/ML     Performed at [facility]   Culture     Final   Value: KLEBSIELLA PNEUMONIAE     Performed at [facility]   Report Status 04/28/2014 FINAL   Final   Organism ID, Bacteria KLEBSIELLA PNEUMONIAE   Final  CULTURE, BLOOD (ROUTINE X 2)     Status: None   Collection Time    04/25/14  4:22 PM      Result Value Ref Range Status   Specimen Description BLOOD RIGHT ARM   Final   Special Requests BOTTLES DRAWN AEROBIC AND ANAEROBIC 10CC   Final   Culture  Setup Time     Final   Value: 04/25/2014 20:01     Performed at [facility]   Culture     Final   Value:        BLOOD CULTURE RECEIVED NO GROWTH TO DATE CULTURE WILL BE HELD FOR 5 DAYS BEFORE ISSUING A FINAL NEGATIVE REPORT     Performed at [facility]   Report Status PENDING   Incomplete  CULTURE, BLOOD (ROUTINE X 2)     Status: None   Collection Time    04/25/14  4:39 PM      Result Value Ref Range Status   Specimen Description BLOOD ARM LEFT   Final   Special Requests BOTTLES DRAWN AEROBIC AND ANAEROBIC 5CC   Final   Culture  Setup Time     Final   Value: 04/25/2014 23:20     Performed at [facility]   Culture     Final   Value:        BLOOD CULTURE RECEIVED NO GROWTH TO DATE CULTURE WILL BE HELD FOR 5 DAYS BEFORE ISSUING A FINAL NEGATIVE REPORT     Performed at [facility]   Report Status PENDING   Incomplete  BODY FLUID CULTURE     Status: None   Collection Time    04/25/14  8:03 PM      Result Value Ref Range Status   Specimen Description SYNOVIAL FLUID ANKLE LEFT   Final   Special Requests Normal   Final   Gram Stain     Final   Value:  FEW WBC PRESENT,BOTH PMN AND MONONUCLEAR     NO ORGANISMS SEEN     Performed at [facility]   Culture     Final   Value: NO GROWTH 2 DAYS     Performed at [facility]   Report Status PENDING   Incomplete  URINE CULTURE     Status: None   Collection Time    04/25/14  8:38 PM      Result Value Ref Range Status   Specimen Description URINE, RANDOM   Final   Special Requests NONE   Final   Culture  Setup Time     Final   Value: 04/26/2014 03:25     Performed at [place]     Final   Value: NO GROWTH     Performed at [facility]   Culture     Final   Value: NO GROWTH     Performed at [facility]   Report Status 04/26/2014 FINAL   Final  SURGICAL PCR SCREEN     Status: None   Collection Time    04/26/14  5:51 AM      Result Value Ref  Range Status   MRSA, PCR NEGATIVE  NEGATIVE Final   Staphylococcus aureus NEGATIVE  NEGATIVE Final   Comment:            The Xpert SA Assay (FDA     approved for NASAL specimens     in patients over 60 years of age),     is one component of     a comprehensive surveillance     program.  Test performance has     been validated by [employer] for patients greater     than or equal to 29 year old.     It is not intended     to diagnose infection nor to     guide or monitor treatment.  ANAEROBIC CULTURE     Status: None   Collection Time    04/26/14  2:03 PM      Result Value Ref Range Status   Specimen Description WOUND ANKLE LEFT   Final   Special Requests PATIENT ON FOLLOWING VANCOMYCIN   Final   Gram Stain     Final   Value: NO WBC SEEN     NO ORGANISMS SEEN     Performed at [facility]   Culture     Final   Value: NO ANAEROBES ISOLATED; CULTURE IN PROGRESS FOR 5 DAYS     Performed at [facility]   Report Status PENDING   Incomplete  TISSUE CULTURE     Status: None   Collection Time    04/26/14  2:03 PM      Result Value Ref Range Status   Specimen  Description TISSUE LEFT ANKLE   Final   Special Requests PATIENT ON FOLLOWING VANCOMYCIN   Final   Gram Stain     Final   Value: NO WBC SEEN     NO ORGANISMS SEEN     Performed at [facility]     Final   Value: Culture reincubated for better growth     Performed at [facility]   Report Status PENDING   Incomplete    Radiology Reports Dg Ankle Complete Left  04/25/2014   CLINICAL DATA:  Wound infection  EXAM: LEFT ANKLE COMPLETE - 3+ VIEW  COMPARISON:  None.  FINDINGS: Frontal, oblique, and lateral views were obtained. Two screws transfix with the hindfoot, including the anterior subtalar joint from a medial approach. Both screws have fractures in their respective mid portions.  There is marked soft tissue swelling throughout the ankle joint with foci of soft tissue air medially concerning for abscess. No acute fracture is seen. The ankle mortise appears intact. There is fluid in the joint. 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 There also surgical screws transfixing the calcaneocuboid joint region.  IMPRESSION: Marked soft tissue swelling with air medially. Suspect abscess within the soft tissues surrounding the ankle and hindfoot. There is postoperative change with 2 screws transfixing the anterior subtalar joint. There are fractures in the mid portions of each screw. There are also screws to the calcaneocuboid joint. The screws do not appear fractured. No acute fracture. Joint effusion which could potentially represent infection within the joint.  Given these findings, it may be prudent to consider contrast enhanced CT to further evaluate.  Critical Value/emergent results were called by telephone at the time of interpretation on 04/25/2014 at 1:33 PM to Dr. Francine Graven , who verbally acknowledged these results.   Electronically Signed  By: Lowella Grip M.D.   On: 04/25/2014 13:34   Mr Ankle Left W Wo Contrast  04/25/2014   CLINICAL DATA:  Left foot pain  and swelling. History of prior surgery.  EXAM: MRI OF THE LEFT ANKLE WITHOUT AND WITH CONTRAST  TECHNIQUE: Multiplanar, multisequence MR imaging of the ankle was performed before and after the administration of intravenous contrast.  CONTRAST:  1 MULTIHANCE GADOBENATE DIMEGLUMINE 529 MG/ML IV SOLN  COMPARISON:  Radiographs 04/25/2014  FINDINGS: Examination is limited by patient motion and artifact from hardware.  Hardware is noted in the midfoot with fractured screws noted on the radiographs. There is diffuse soft tissue swelling/edema and multiple rim enhancing subcutaneous fluid collections suspicious for abscesses. The largest cyst along the lateral malleolus. There is also a large joint effusion involving the tibiotalar joint with thick rim enhancing synovium. Could not exclude septic arthritis. Recommend joint aspiration. It is difficult to evaluate for osteomyelitis because of the significant artifact. Severe talonavicular joint degenerative changes are noted with moderate subluxation. Rim enhancing joint effusion is noted here also.  IMPRESSION: IMPRESSION Medial and lateral soft tissue abscesses.  Joint effusions and marked synovial enhancement worrisome for septic arthritis. This mainly involves the tibiotalar joint and talonavicular joint.  Limited examination for osteomyelitis due to artifact and patient motion.   Electronically Signed   By: Kalman Jewels M.D.   On: 04/25/2014 16:36   Dg Fluoro Guide Ndl Plc/bx  04/25/2014   CLINICAL DATA:  3-YEAR-OLD FEMALE WITH SUSPECTED SEPTIC LEFT ANKLE.  EXAM: LEFT ANKLE JOINT ASPIRATION  TECHNIQUE: Written informed consent was obtained from the patient following explanation of the risks, benefits and alternatives. The ankle was prepped and draped in the usual fashion using Betadine skin prep. Localization was performed under fluoroscopy. A 20 gauge spinal needle was advanced into the tibiotalar joint and a small volume of turbid bloody fluid was aspirated. An  18 gauge needle was also advanced into an area of fluctuance overlying the lateral malleolus. Approximately 1 cc of purulent fluid was successfully aspirated.  Hemostasis was attained by gentle manual pressure and Band-Aids were applied. The patient tolerated the procedure well. There was no immediate complication.  FLUOROSCOPY TIME:  37 seconds  COMPARISON:  Ankle MRI 04/25/2014  FINDINGS: Successful aspiration of a small amount of purulent/bloody fluid from the tibiotalar joint and fluid overlying the lateral malleolus.  IMPRESSION: Successful aspiration of a small amount of purulent/bloody fluid from the tibiotalar joint and fluid overlying the lateral malleolus.  Fluid sent for Gram stain and culture.   Electronically Signed   By: Jacqulynn Cadet M.D.   On: 04/25/2014 21:26    CBC  Recent Labs Lab 04/25/14 1229 04/26/14 0525 04/28/14 0530  WBC 9.6 9.2 7.7  HGB 10.2* 11.0* 10.3*  HCT 30.9* 34.0* 32.2*  PLT 315 340 378  MCV 91.4 94.4 94.2  MCH 30.2 30.6 30.1  MCHC 33.0 32.4 32.0  RDW 15.2 15.3 15.5  LYMPHSABS 1.2  --   --   MONOABS 0.8  --   --   EOSABS 0.0  --   --   BASOSABS 0.0  --   --     Chemistries   Recent Labs Lab 04/25/14 1229 04/26/14 0525 04/28/14 0530 04/28/14 1105 04/29/14 0500  NA 134* 143 142  --   --   K 3.5* 4.2 3.4* 3.6*  --   CL 92* 98 99  --   --   CO2 30 33* 31  --   --  GLUCOSE 238* 203* 172*  --   --   BUN 16 17 14   --   --   CREATININE 0.82 0.84 0.88  --   --   CALCIUM 9.1 9.7 8.6  --   --   MG  --   --   --   --  2.1  AST 15  --   --   --   --   ALT 12  --   --   --   --   ALKPHOS 142*  --   --   --   --   BILITOT 0.3  --   --   --   --    ------------------------------------------------------------------------------------------------------------------ estimated creatinine clearance is 71.3 ml/min (by C-G formula based on Cr of  0.88). ------------------------------------------------------------------------------------------------------------------ No results found for this basename: HGBA1C,  in the last 72 hours ------------------------------------------------------------------------------------------------------------------ No results found for this basename: CHOL, HDL, LDLCALC, TRIG, CHOLHDL, LDLDIRECT,  in the last 72 hours ------------------------------------------------------------------------------------------------------------------ No results found for this basename: TSH, T4TOTAL, FREET3, T3FREE, THYROIDAB,  in the last 72 hours ------------------------------------------------------------------------------------------------------------------ No results found for this basename: VITAMINB12, FOLATE, FERRITIN, TIBC, IRON, RETICCTPCT,  in the last 72 hours  Coagulation profile No results found for this basename: INR, PROTIME,  in the last 168 hours  No results found for this basename: DDIMER,  in the last 72 hours  Cardiac Enzymes No results found for this basename: CK, CKMB, TROPONINI, MYOGLOBIN,  in the last 168 hours ------------------------------------------------------------------------------------------------------------------ No components found with this basename: POCBNP,      Time Spent in minutes   35   Thurnell Lose M.D on 04/29/2014 at 8:31 AM  Between 7am to 7pm - Pager - [phone]  After 7pm go to [email or web address] - password [email or web address]  And look for the night coverage person covering for me after hours  [facility] Office  [phone]

## 2014-04-30 DIAGNOSIS — M869 Osteomyelitis, unspecified: Secondary | ICD-10-CM

## 2014-04-30 DIAGNOSIS — B961 Klebsiella pneumoniae [K. pneumoniae] as the cause of diseases classified elsewhere: Secondary | ICD-10-CM | POA: Diagnosis not present

## 2014-04-30 DIAGNOSIS — N390 Urinary tract infection, site not specified: Secondary | ICD-10-CM

## 2014-04-30 DIAGNOSIS — E119 Type 2 diabetes mellitus without complications: Secondary | ICD-10-CM

## 2014-04-30 DIAGNOSIS — L02419 Cutaneous abscess of limb, unspecified: Secondary | ICD-10-CM | POA: Diagnosis not present

## 2014-04-30 LAB — GLUCOSE, CAPILLARY
GLUCOSE-CAPILLARY: 152 mg/dL — AB (ref 70–99)
Glucose-Capillary: 145 mg/dL — ABNORMAL HIGH (ref 70–99)
Glucose-Capillary: 158 mg/dL — ABNORMAL HIGH (ref 70–99)
Glucose-Capillary: 183 mg/dL — ABNORMAL HIGH (ref 70–99)

## 2014-04-30 LAB — SEDIMENTATION RATE: Sed Rate: 121 mm/hr — ABNORMAL HIGH (ref 0–22)

## 2014-04-30 LAB — POTASSIUM: Potassium: 3.9 mEq/L (ref 3.7–5.3)

## 2014-04-30 MED ORDER — INSULIN STARTER KIT- SYRINGES (ENGLISH)
1.0000 | Freq: Once | Status: AC
  Administered 2014-04-30: 1
  Filled 2014-04-30: qty 1

## 2014-04-30 MED ORDER — LIVING WELL WITH DIABETES BOOK
Freq: Once | Status: DC
  Filled 2014-04-30: qty 1

## 2014-04-30 NOTE — Progress Notes (Signed)
Physical Therapy Treatment Patient Details Name: Kathryn Ryan MRN: [id number] DOB: [birth year] Today's Date: 04/30/2014    History of Present Illness Pt is a 73 y.o. 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     PT Comments    Session focused on increasing mobility and assessing patient's ability to mobilize with knee walker. Pt fatigued quickly and required min (A) to manage knee walker. At this time, pt is more steady and safe when ambulating with RW. WIll cont to follow per POC. Patient needs to practice stairs next session.     Follow Up Recommendations  Home health PT;Supervision/Assistance - 24 hour     Equipment Recommendations  None recommended by PT    Recommendations for Other Services OT consult     Precautions / Restrictions Precautions Precautions: None Required Braces or Orthoses: Other Brace/Splint Other Brace/Splint: CAM walker  (able to doff for knee walker ) Restrictions Weight Bearing Restrictions: Yes LLE Weight Bearing: Weight bearing as tolerated    Mobility  Bed Mobility               General bed mobility comments: not addressed; pt in recliner and returned to recliner  Transfers Overall transfer level: Needs assistance Equipment used:  (knee walker ) Transfers: Sit to/from Stand Sit to Stand: Min guard         General transfer comment: sit to stand with knee walker position with brakes applied; min guard to steady and for positioning of knee walker  Ambulation/Gait Ambulation/Gait assistance: Min guard;Min assist Ambulation Distance (Feet): 100 Feet Assistive device:  (knee walker ) Gait Pattern/deviations: Step-to pattern Gait velocity: decreased Gait velocity interpretation: Below normal speed for age/gender General Gait Details: pt requires min guard when ambulating with knee walker on straight and narrow hallway; has difficulty managing knee walker with turns and requires (A); pt very  fatugued with use of knee walker; required 3 standing rest breaks due to fatigue; difficulty with getting off RW and required min guard to steady and balance recliner    Stairs            Wheelchair Mobility    Modified Rankin (Stroke Patients Only)       Balance Overall balance assessment: Needs assistance         Standing balance support: During functional activity;Bilateral upper extremity supported Standing balance-Leahy Scale: Poor Standing balance comment: unsteady getting off knee walker; requiers min guard to min (A) for support                    Cognition Arousal/Alertness: Awake/alert Behavior During Therapy: WFL for tasks assessed/performed Overall Cognitive Status: Within Functional Limits for tasks assessed                      Exercises      General Comments        Pertinent Vitals/Pain C/o 4/10; patient repositioned for comfort     Home Living                      Prior Function            PT Goals (current goals can now be found in the care plan section) Acute Rehab PT Goals Patient Stated Goal: to go home this weekend  PT Goal Formulation: With patient Time For Goal Achievement: 05/02/14 Potential to Achieve Goals: Good Progress towards PT goals: Progressing toward goals  Frequency  Min 4X/week    PT Plan Current plan remains appropriate    Co-evaluation             End of Session Equipment Utilized During Treatment: Gait belt Activity Tolerance: Patient tolerated treatment well Patient left: in chair;with call bell/phone within reach     Time: 1259-1327 PT Time Calculation (min): 28 min  Charges:  [employer] Training: 23-37 mins                    G Codes:      [place], [place]  [phone] 04/30/2014, 2:21 PM

## 2014-04-30 NOTE — Progress Notes (Signed)
Subjective: 4 Days Post-Op Procedure(s) (LRB): HARDWARE REMOVAL LEFT ANKLE (Left) IRRIGATION AND DEBRIDEMENT LEFT ANKLE (Left) Patient reports pain as mild.  WBAT in cam boot.  No f/c/n/v.  On vanc and zosyn.  Picc line in place.  Dx'd this admission with diabetes.  Objective: Vital signs in last 24 hours: Temp:  [97.9 F (36.6 C)-98.5 F (36.9 C)] 97.9 F (36.6 C) (05/24 0506) Pulse Rate:  [82-92] 82 (05/24 0506) Resp:  [15-20] 15 (05/24 0506) BP: (129-153)/(59-73) 153/73 mmHg (05/24 0506) SpO2:  [92 %-98 %] 98 % (05/24 0506)  Intake/Output from previous day: 05/23 0701 - 05/24 0700 In: 1640 [P.O.:540; IV Piggyback:1100] Out: -  Intake/Output this shift:     Recent Labs  04/28/14 0530  HGB 10.3*    Recent Labs  04/28/14 0530  WBC 7.7  RBC 3.42*  HCT 32.2*  PLT 378    Recent Labs  04/28/14 0530 04/28/14 1105 04/30/14 0545  NA 142  --   --   K 3.4* 3.6* 3.9  CL 99  --   --   CO2 31  --   --   BUN 14  --   --   CREATININE 0.88  --   --   GLUCOSE 172*  --   --   CALCIUM 8.6  --   --    No results found for this basename: LABPT, INR,  in the last 72 hours  PE:  wn wd woman in nad.  L LE immobilized in cam boot.  wounds dressed and dry.  Assessment/Plan: 4 Days Post-Op Procedure(s) (LRB): HARDWARE REMOVAL LEFT ANKLE (Left) IRRIGATION AND DEBRIDEMENT LEFT ANKLE (Left)  Awaiting final abx recs from Dr. Johnnye Sima.  From ortho perspective, she can be discharged home at any time.  WBAT on L LE in cam boot.  She can remove the boot to use the knee scooter as needed.  Dry dressing to ankle wounds daily and as needed.  I'll see her back in clinic in two weeks or on Tuesday if she's still here.  Wylene Simmer 04/30/2014, 9:54 AM

## 2014-04-30 NOTE — Progress Notes (Addendum)
Received call from RN caring for patient on 6N.  Per RN, patient with new diagnosis of DM and will be d/c'd home likely tomorrow or the next day.  Ordered Living Well with DM book for patient and asked RN to please begin basic DM teaching with patient.  DM Coordinator to follow up with patient tomorrow AM.  Also ordered RD consult and Insulin syringe teaching kit.  RN to begin insulin teaching tonight.  Will also place OP DM education referral for pt to the [place] and DM management center for further education after d/c.  Wyn Quaker RN, MSN, CDE Diabetes Coordinator Inpatient Diabetes Program Team Pager: [phone] (8a-10p)

## 2014-04-30 NOTE — Progress Notes (Signed)
INFECTIOUS DISEASE PROGRESS NOTE  ID: Kathryn Ryan is a 73 y.o. 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   Abtx:  Anti-infectives   Start     Dose/Rate Route Frequency Ordered Stop   04/28/14 1400  vancomycin (VANCOCIN) 1,250 mg in sodium chloride 0.9 % 250 mL IVPB     1,250 mg 166.7 mL/hr over 90 Minutes Intravenous Every 12 hours 04/28/14 1327     04/26/14 1000  vancomycin (VANCOCIN) IVPB 1000 mg/200 mL premix  Status:  Discontinued     1,000 mg 200 mL/hr over 60 Minutes Intravenous Every 12 hours 04/25/14 2028 04/28/14 1326   04/25/14 2200  piperacillin-tazobactam (ZOSYN) IVPB 3.375 g     3.375 g 12.5 mL/hr over 240 Minutes Intravenous 3 times per day 04/25/14 2028     04/25/14 2100  vancomycin (VANCOCIN) 2,000 mg in sodium chloride 0.9 % 500 mL IVPB     2,000 mg 250 mL/hr over 120 Minutes Intravenous NOW 04/25/14 2028 04/25/14 2322   04/25/14 1400  vancomycin (VANCOCIN) 2,000 mg in sodium chloride 0.9 % 500 mL IVPB  Status:  Discontinued     2,000 mg 250 mL/hr over 120 Minutes Intravenous  Once 04/25/14 1356 04/25/14 1515   04/25/14 1400  piperacillin-tazobactam (ZOSYN) IVPB 3.375 g     3.375 g 100 mL/hr over 30 Minutes Intravenous  Once 04/25/14 1356 04/25/14 1624      Medications:  Scheduled: . docusate sodium  200 mg Oral BID  . heparin subcutaneous  5,000 Units Subcutaneous 3 times per day  . HYDROcodone-acetaminophen  2 tablet Oral QHS  . insulin aspart  0-9 Units Subcutaneous TID WC  . insulin glargine  24 Units Subcutaneous Daily  . levothyroxine  100 mcg Oral QAC breakfast  . lisinopril  10 mg Oral Daily  . pantoprazole  40 mg Oral Daily  . piperacillin-tazobactam (ZOSYN)  IV  3.375 g Intravenous 3 times per day  . polyethylene glycol  17 g Oral BID  .  pregabalin  150 mg Oral BID  . senna  1 tablet Oral BID  . sodium chloride  10-40 mL Intracatheter Q12H  . vancomycin  1,250 mg Intravenous Q12H    Objective: Vital signs in last 24 hours: Temp:  [97.9 F (36.6 C)-98.5 F (36.9 C)] 97.9 F (36.6 C) (05/24 0506) Pulse Rate:  [82-92] 82 (05/24 0506) Resp:  [15-20] 15 (05/24 0506) BP: (129-153)/(59-73) 153/73 mmHg (05/24 0506) SpO2:  [92 %-98 %] 98 % (05/24 0506)   General appearance: alert, cooperative and no distress Incision/Wound: LLE dressed.   Lab Results  Recent Labs  04/28/14 0530 04/28/14 1105 04/30/14 0545  WBC 7.7  --   --   HGB 10.3*  --   --   HCT 32.2*  --   --   NA 142  --   --   K 3.4* 3.6* 3.9  CL 99  --   --   CO2 31  --   --   BUN 14  --   --   CREATININE 0.88  --   --    Liver Panel No results found for this basename: PROT, ALBUMIN, AST, ALT, ALKPHOS, BILITOT, BILIDIR, IBILI,  in the last 72 hours Sedimentation Rate No results found for this  basename: ESRSEDRATE,  in the last 72 hours C-Reactive Protein No results found for this basename: CRP,  in the last 72 hours  Microbiology: Recent Results (from the past 240 hour(s))  URINE CULTURE     Status: None   Collection Time    04/25/14 12:55 PM      Result Value Ref Range Status   Specimen Description URINE, CLEAN CATCH   Final   Special Requests NONE   Final   Culture  Setup Time     Final   Value: 04/25/2014 19:34     Performed at [facility]     Final   Value: >=100,000 COLONIES/ML     Performed at [facility]   Culture     Final   Value: KLEBSIELLA PNEUMONIAE     Performed at [facility]   Report Status 04/28/2014 FINAL   Final   Organism ID, Bacteria KLEBSIELLA PNEUMONIAE   Final  CULTURE, BLOOD (ROUTINE X 2)     Status: None   Collection Time    04/25/14  4:22 PM      Result Value Ref Range Status   Specimen Description BLOOD RIGHT ARM   Final   Special Requests BOTTLES DRAWN AEROBIC AND  ANAEROBIC 10CC   Final   Culture  Setup Time     Final   Value: 04/25/2014 20:01     Performed at [facility]   Culture     Final   Value:        BLOOD CULTURE RECEIVED NO GROWTH TO DATE CULTURE WILL BE HELD FOR 5 DAYS BEFORE ISSUING A FINAL NEGATIVE REPORT     Performed at [facility]   Report Status PENDING   Incomplete  CULTURE, BLOOD (ROUTINE X 2)     Status: None   Collection Time    04/25/14  4:39 PM      Result Value Ref Range Status   Specimen Description BLOOD ARM LEFT   Final   Special Requests BOTTLES DRAWN AEROBIC AND ANAEROBIC 5CC   Final   Culture  Setup Time     Final   Value: 04/25/2014 23:20     Performed at [facility]   Culture     Final   Value:        BLOOD CULTURE RECEIVED NO GROWTH TO DATE CULTURE WILL BE HELD FOR 5 DAYS BEFORE ISSUING A FINAL NEGATIVE REPORT     Performed at [facility]   Report Status PENDING   Incomplete  BODY FLUID CULTURE     Status: None   Collection Time    04/25/14  8:03 PM      Result Value Ref Range Status   Specimen Description SYNOVIAL FLUID ANKLE LEFT   Final   Special Requests Normal   Final   Gram Stain     Final   Value: FEW WBC PRESENT,BOTH PMN AND MONONUCLEAR     NO ORGANISMS SEEN     Performed at [facility]   Culture     Final   Value: NO GROWTH 3 DAYS     Performed at [facility]   Report Status 04/29/2014 FINAL   Final  URINE CULTURE     Status: None   Collection Time    04/25/14  8:38 PM      Result Value Ref Range Status   Specimen Description URINE, RANDOM   Final   Special  Requests NONE   Final   Culture  Setup Time     Final   Value: 04/26/2014 03:25     Performed at [facility]     Final   Value: NO GROWTH     Performed at [facility]   Culture     Final   Value: NO GROWTH     Performed at [facility]   Report Status 04/26/2014 FINAL   Final  SURGICAL PCR SCREEN     Status: None   Collection Time     04/26/14  5:51 AM      Result Value Ref Range Status   MRSA, PCR NEGATIVE  NEGATIVE Final   Staphylococcus aureus NEGATIVE  NEGATIVE Final   Comment:            The Xpert SA Assay (FDA     approved for NASAL specimens     in patients over 48 years of age),     is one component of     a comprehensive surveillance     program.  Test performance has     been validated by [employer] for patients greater     than or equal to 20 year old.     It is not intended     to diagnose infection nor to     guide or monitor treatment.  ANAEROBIC CULTURE     Status: None   Collection Time    04/26/14  2:03 PM      Result Value Ref Range Status   Specimen Description WOUND ANKLE LEFT   Final   Special Requests PATIENT ON FOLLOWING VANCOMYCIN   Final   Gram Stain     Final   Value: NO WBC SEEN     NO ORGANISMS SEEN     Performed at [facility]   Culture     Final   Value: NO ANAEROBES ISOLATED; CULTURE IN PROGRESS FOR 5 DAYS     Performed at [facility]   Report Status PENDING   Incomplete  TISSUE CULTURE     Status: None   Collection Time    04/26/14  2:03 PM      Result Value Ref Range Status   Specimen Description TISSUE LEFT ANKLE   Final   Special Requests PATIENT ON FOLLOWING VANCOMYCIN   Final   Gram Stain     Final   Value: NO WBC SEEN     NO ORGANISMS SEEN     Performed at [facility]   Culture     Final   Value: FEW STAPHYLOCOCCUS SPECIES (COAGULASE NEGATIVE)     Performed at [facility]   Report Status 04/30/2014 FINAL   Final    Studies/Results: No results found.   Assessment/Plan: Osteomyelitis L foot/ankle  DM2 (new)  UTI- kleb pneumo  Total days of antibiotics 6 vanco/zosyn  Stop zosyn in AM Will get sensi of Coag Neg Staph done to see if she can go home on ancef ( vs vanco) Check ESR and CRP Plan for 6 weeks of IV anbx,  F/u in ID clinic in 1 month Fax weekly labs (CBC, CMP, ESR, CRP) to [facility] (pager) [phone] [email or web address] 04/30/2014, 2:19 PM  LOS: 5 days   **Disclaimer: This note may have been dictated with voice recognition software. Similar sounding  words can inadvertently be transcribed and this note may contain transcription errors which may not have been corrected upon publication of note.**

## 2014-04-30 NOTE — Progress Notes (Signed)
Patient Demographics  Kathryn Ryan, is a 73 y.o. female, DOB - 12-13-1940, ZOX:[id number]  Admit date - 04/25/2014   Admitting Physician Verlee Monte, MD  Outpatient Primary MD for the patient is Gar Ponto, MD  LOS - 5   Chief Complaint  Patient presents with  . 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 Wylene Simmer on 04/26/2014, on empiric vancomycin and Zosyn. ID following. Weight bearing per orthopedics, patient has a PICC line in the right arm. Will defer duration and course of antibiotics per infectious disease. We'll likely discharge in the morning on IV antibiotics as suggested by ID. So far growing coag-negative staph.    UTI?   NOTED urine cultures showing Klebsiella     Hyperglycemia   Patient has no documented history of DM. Newly diagnosed type 2 diabetes mellitus this admission. On SSI , added Lantus for better control will increase to 24 U on 04-29-14. We'll provide her with diabetic education.    Lab Results  Component Value Date   HGBA1C 7.0* 04/25/2014    CBG (last 3)   Recent Labs  04/29/14 1713 04/29/14 2215 04/30/14 0806  GLUCAP 138* 159* 90*      Hypothyroid  Will continue Synthroid     HTN  Holding HCTZ.  Will continue Lisinopril.       Neuropathy  Has a history of claudication with 2 spinal surgeries  Continue Lyrica   .  GERD  Continue PPI    Low K -  replaced & stable      Code Status: Full  Family Communication: none present  Disposition Plan: TBD   Procedures    04/26/2014 by Dr. Doran Durand   1. Removal of deep implants from talonavicular joint medially  2. Irrigation and debridement of left foot abscesses in multiple locations  3. Removal of deep implants from calcaneocuboid joint laterally (separate incision)  4. Left ankle arthrotomy with irrigation and debridement    Consults  Otho, ID   Medications  Scheduled Meds: . docusate sodium  200 mg Oral BID  . heparin subcutaneous  5,000 Units Subcutaneous 3 times per day  . HYDROcodone-acetaminophen  2 tablet Oral QHS  . insulin aspart  0-9 Units Subcutaneous TID WC  . insulin glargine  24 Units Subcutaneous Daily  . levothyroxine  100 mcg Oral QAC breakfast  . lisinopril  10 mg Oral Daily  . pantoprazole  40 mg Oral Daily  . piperacillin-tazobactam (ZOSYN)  IV  3.375 g Intravenous 3 times per day  . polyethylene glycol  17 g Oral BID  . pregabalin  150 mg Oral BID  . senna  1 tablet Oral BID  . sodium chloride  10-40 mL Intracatheter Q12H  . vancomycin  1,250 mg Intravenous Q12H   Continuous Infusions:   PRN Meds:.acetaminophen, alum & mag hydroxide-simeth, diphenhydrAMINE, hydrALAZINE, ibuprofen, metoprolol, morphine injection, ondansetron (  ZOFRAN) IV, oxyCODONE, sodium chloride  DVT Prophylaxis Heparin  Lab Results  Component Value Date   PLT 378 04/28/2014    Antibiotics     Anti-infectives   Start     Dose/Rate Route Frequency Ordered Stop   04/28/14 1400  vancomycin (VANCOCIN) 1,250 mg in sodium chloride 0.9 % 250 mL IVPB     1,250 mg 166.7 mL/hr over 90 Minutes Intravenous Every 12 hours 04/28/14 1327     04/26/14 1000  vancomycin (VANCOCIN) IVPB 1000 mg/200 mL premix  Status:  Discontinued     1,000 mg 200 mL/hr over 60 Minutes Intravenous Every 12 hours 04/25/14 2028 04/28/14 1326   04/25/14 2200  piperacillin-tazobactam (ZOSYN) IVPB 3.375 g      3.375 g 12.5 mL/hr over 240 Minutes Intravenous 3 times per day 04/25/14 2028     04/25/14 2100  vancomycin (VANCOCIN) 2,000 mg in sodium chloride 0.9 % 500 mL IVPB     2,000 mg 250 mL/hr over 120 Minutes Intravenous NOW 04/25/14 2028 04/25/14 2322   04/25/14 1400  vancomycin (VANCOCIN) 2,000 mg in sodium chloride 0.9 % 500 mL IVPB  Status:  Discontinued     2,000 mg 250 mL/hr over 120 Minutes Intravenous  Once 04/25/14 1356 04/25/14 1515   04/25/14 1400  piperacillin-tazobactam (ZOSYN) IVPB 3.375 g     3.375 g 100 mL/hr over 30 Minutes Intravenous  Once 04/25/14 1356 04/25/14 1624          Subjective:    Kathryn Ryan today has, mild generalised headache, No chest pain, No abdominal pain - No Nausea, No new weakness tingling or numbness, No Cough - SOB. No pain in the left ankle, feels better. Had a BM yesterday.   Objective:   Filed Vitals:   04/29/14 0631 04/29/14 1500 04/29/14 2217 04/30/14 0506  BP: 145/67 129/59 138/65 153/73  Pulse: 89 92 92 82  Temp: 97.6 F (36.4 C) 98.5 F (36.9 C) 98.3 F (36.8 C) 97.9 F (36.6 C)  TempSrc: Axillary Oral Oral Oral  Resp: 18 20 18 15   Height:      Weight:      SpO2: 94% 93% 92% 98%    Wt Readings from Last 3 Encounters:  04/25/14 106.595 kg (235 lb)  04/25/14 106.595 kg (235 lb)  02/27/12 107.1 kg (236 lb 1.8 oz)     Intake/Output Summary (Last 24 hours) at 04/30/14 0921 Last data filed at 04/29/14 1405  Gross per 24 hour  Intake   1640 ml  Output      0 ml  Net   1640 ml     Physical Exam  Awake Alert, Oriented X 3, No new F.N deficits, Normal affect [place].AT,PERRAL Supple Neck,No JVD, No cervical lymphadenopathy appriciated.  Symmetrical Chest wall movement, Good air movement bilaterally, CTAB RRR,No Gallops,Rubs or new Murmurs, No Parasternal Heave +ve B.Sounds, Abd Soft, Non tender, No organomegaly appriciated, No rebound - guarding or rigidity. No Cyanosis, Clubbing or edema, No new Rash or  bruise, Left ankle has a Cam walker boot, drains are out, good range of motion and sensation in toes    Data Review   Micro Results Recent Results (from the past 240 hour(s))  URINE CULTURE     Status: None   Collection Time    04/25/14 12:55 PM      Result Value Ref Range Status   Specimen Description URINE, CLEAN CATCH   Final   Special Requests NONE   Final   Culture  Setup Time     Final   Value: 04/25/2014 19:34     Performed at [place]     Final   Value: >=100,000 COLONIES/ML     Performed at [facility]   Culture     Final   Value: KLEBSIELLA PNEUMONIAE     Performed at [facility]   Report Status 04/28/2014 FINAL   Final   Organism ID, Bacteria KLEBSIELLA PNEUMONIAE   Final  CULTURE, BLOOD (ROUTINE X 2)     Status: None   Collection Time    04/25/14  4:22 PM      Result Value Ref Range Status   Specimen Description BLOOD RIGHT ARM   Final   Special Requests BOTTLES DRAWN AEROBIC AND ANAEROBIC 10CC   Final   Culture  Setup Time     Final   Value: 04/25/2014 20:01     Performed at [facility]   Culture     Final   Value:        BLOOD CULTURE RECEIVED NO GROWTH TO DATE CULTURE WILL BE HELD FOR 5 DAYS BEFORE ISSUING A FINAL NEGATIVE REPORT     Performed at [facility]   Report Status PENDING   Incomplete  CULTURE, BLOOD (ROUTINE X 2)     Status: None   Collection Time    04/25/14  4:39 PM      Result Value Ref Range Status   Specimen Description BLOOD ARM LEFT   Final   Special Requests BOTTLES DRAWN AEROBIC AND ANAEROBIC 5CC   Final   Culture  Setup Time     Final   Value: 04/25/2014 23:20     Performed at [facility]   Culture     Final   Value:        BLOOD CULTURE RECEIVED NO GROWTH TO DATE CULTURE WILL BE HELD FOR 5 DAYS BEFORE ISSUING A FINAL NEGATIVE REPORT     Performed at [facility]   Report Status PENDING   Incomplete  BODY FLUID CULTURE     Status: None   Collection  Time    04/25/14  8:03 PM      Result Value Ref Range Status   Specimen Description SYNOVIAL FLUID ANKLE LEFT   Final   Special Requests Normal   Final   Gram Stain     Final   Value: FEW WBC PRESENT,BOTH PMN AND MONONUCLEAR     NO ORGANISMS SEEN     Performed at [facility]   Culture     Final   Value: NO GROWTH 3 DAYS     Performed at [facility]   Report Status 04/29/2014 FINAL   Final  URINE CULTURE     Status: None   Collection Time    04/25/14  8:38 PM      Result Value Ref Range Status   Specimen Description URINE, RANDOM   Final   Special Requests NONE   Final   Culture  Setup Time     Final   Value: 04/26/2014 03:25     Performed at [place]     Final   Value: NO GROWTH     Performed at [facility]   Culture     Final   Value: NO GROWTH     Performed at [facility]   Report Status 04/26/2014 FINAL  Final  SURGICAL PCR SCREEN     Status: None   Collection Time    04/26/14  5:51 AM      Result Value Ref Range Status   MRSA, PCR NEGATIVE  NEGATIVE Final   Staphylococcus aureus NEGATIVE  NEGATIVE Final   Comment:            The Xpert SA Assay (FDA     approved for NASAL specimens     in patients over 29 years of age),     is one component of     a comprehensive surveillance     program.  Test performance has     been validated by [employer] for patients greater     than or equal to 87 year old.     It is not intended     to diagnose infection nor to     guide or monitor treatment.  ANAEROBIC CULTURE     Status: None   Collection Time    04/26/14  2:03 PM      Result Value Ref Range Status   Specimen Description WOUND ANKLE LEFT   Final   Special Requests PATIENT ON FOLLOWING VANCOMYCIN   Final   Gram Stain     Final   Value: NO WBC SEEN     NO ORGANISMS SEEN     Performed at [facility]   Culture     Final   Value: NO ANAEROBES ISOLATED; CULTURE IN PROGRESS FOR 5 DAYS      Performed at [facility]   Report Status PENDING   Incomplete  TISSUE CULTURE     Status: None   Collection Time    04/26/14  2:03 PM      Result Value Ref Range Status   Specimen Description TISSUE LEFT ANKLE   Final   Special Requests PATIENT ON FOLLOWING VANCOMYCIN   Final   Gram Stain     Final   Value: NO WBC SEEN     NO ORGANISMS SEEN     Performed at [facility]   Culture     Final   Value: FEW STAPHYLOCOCCUS SPECIES (COAGULASE NEGATIVE)     Performed at [facility]   Report Status 04/30/2014 FINAL   Final     Radiology Reports Dg Ankle Complete Left  04/25/2014   CLINICAL DATA:  Wound infection  EXAM: LEFT ANKLE COMPLETE - 3+ VIEW  COMPARISON:  None.  FINDINGS: Frontal, oblique, and lateral views were obtained. Two screws transfix with the hindfoot, including the anterior subtalar joint from a medial approach. Both screws have fractures in their respective mid portions.  There is marked soft tissue swelling throughout the ankle joint with foci of soft tissue air medially concerning for abscess. No acute fracture is seen. The ankle mortise appears intact. There is fluid in the joint. 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 There also surgical screws transfixing the calcaneocuboid joint region.  IMPRESSION: Marked soft tissue swelling with air medially. Suspect abscess within the soft tissues surrounding the ankle and hindfoot. There is postoperative change with 2 screws transfixing the anterior subtalar joint. There are fractures in the mid portions of each screw. There are also screws to the calcaneocuboid joint. The screws do not appear fractured. No acute fracture. Joint effusion which could potentially represent infection within the joint.  Given these findings, it may be prudent to consider contrast enhanced CT to further evaluate.  Critical  Value/emergent results were called by telephone at the time of interpretation on 04/25/2014 at 1:33 PM to Dr.  Francine Graven , who verbally acknowledged these results.   Electronically Signed   By: Lowella Grip M.D.   On: 04/25/2014 13:34   Mr Ankle Left W Wo Contrast  04/25/2014   CLINICAL DATA:  Left foot pain and swelling. History of prior surgery.  EXAM: MRI OF THE LEFT ANKLE WITHOUT AND WITH CONTRAST  TECHNIQUE: Multiplanar, multisequence MR imaging of the ankle was performed before and after the administration of intravenous contrast.  CONTRAST:  1 MULTIHANCE GADOBENATE DIMEGLUMINE 529 MG/ML IV SOLN  COMPARISON:  Radiographs 04/25/2014  FINDINGS: Examination is limited by patient motion and artifact from hardware.  Hardware is noted in the midfoot with fractured screws noted on the radiographs. There is diffuse soft tissue swelling/edema and multiple rim enhancing subcutaneous fluid collections suspicious for abscesses. The largest cyst along the lateral malleolus. There is also a large joint effusion involving the tibiotalar joint with thick rim enhancing synovium. Could not exclude septic arthritis. Recommend joint aspiration. It is difficult to evaluate for osteomyelitis because of the significant artifact. Severe talonavicular joint degenerative changes are noted with moderate subluxation. Rim enhancing joint effusion is noted here also.  IMPRESSION: IMPRESSION Medial and lateral soft tissue abscesses.  Joint effusions and marked synovial enhancement worrisome for septic arthritis. This mainly involves the tibiotalar joint and talonavicular joint.  Limited examination for osteomyelitis due to artifact and patient motion.   Electronically Signed   By: Kalman Jewels M.D.   On: 04/25/2014 16:36   Dg Fluoro Guide Ndl Plc/bx  04/25/2014   CLINICAL DATA:  22-YEAR-OLD FEMALE WITH SUSPECTED SEPTIC LEFT ANKLE.  EXAM: LEFT ANKLE JOINT ASPIRATION  TECHNIQUE: Written informed consent was obtained from the patient following explanation of the risks, benefits and alternatives. The ankle was prepped and draped in  the usual fashion using Betadine skin prep. Localization was performed under fluoroscopy. A 20 gauge spinal needle was advanced into the tibiotalar joint and a small volume of turbid bloody fluid was aspirated. An 18 gauge needle was also advanced into an area of fluctuance overlying the lateral malleolus. Approximately 1 cc of purulent fluid was successfully aspirated.  Hemostasis was attained by gentle manual pressure and Band-Aids were applied. The patient tolerated the procedure well. There was no immediate complication.  FLUOROSCOPY TIME:  37 seconds  COMPARISON:  Ankle MRI 04/25/2014  FINDINGS: Successful aspiration of a small amount of purulent/bloody fluid from the tibiotalar joint and fluid overlying the lateral malleolus.  IMPRESSION: Successful aspiration of a small amount of purulent/bloody fluid from the tibiotalar joint and fluid overlying the lateral malleolus.  Fluid sent for Gram stain and culture.   Electronically Signed   By: Jacqulynn Cadet M.D.   On: 04/25/2014 21:26    CBC  Recent Labs Lab 04/25/14 1229 04/26/14 0525 04/28/14 0530  WBC 9.6 9.2 7.7  HGB 10.2* 11.0* 10.3*  HCT 30.9* 34.0* 32.2*  PLT 315 340 378  MCV 91.4 94.4 94.2  MCH 30.2 30.6 30.1  MCHC 33.0 32.4 32.0  RDW 15.2 15.3 15.5  LYMPHSABS 1.2  --   --   MONOABS 0.8  --   --   EOSABS 0.0  --   --   BASOSABS 0.0  --   --     Chemistries   Recent Labs Lab 04/25/14 1229 04/26/14 0525 04/28/14 0530 04/28/14 1105 04/29/14 0500 04/30/14 0545  NA 134* 143 142  --   --   --  K 3.5* 4.2 3.4* 3.6*  --  3.9  CL 92* 98 99  --   --   --   CO2 30 33* 31  --   --   --   GLUCOSE 238* 203* 172*  --   --   --   BUN 16 17 14   --   --   --   CREATININE 0.82 0.84 0.88  --   --   --   CALCIUM 9.1 9.7 8.6  --   --   --   MG  --   --   --   --  2.1  --   AST 15  --   --   --   --   --   ALT 12  --   --   --   --   --   ALKPHOS 142*  --   --   --   --   --   BILITOT 0.3  --   --   --   --   --     ------------------------------------------------------------------------------------------------------------------ estimated creatinine clearance is 71.3 ml/min (by C-G formula based on Cr of 0.88). ------------------------------------------------------------------------------------------------------------------ No results found for this basename: HGBA1C,  in the last 72 hours ------------------------------------------------------------------------------------------------------------------ No results found for this basename: CHOL, HDL, LDLCALC, TRIG, CHOLHDL, LDLDIRECT,  in the last 72 hours ------------------------------------------------------------------------------------------------------------------ No results found for this basename: TSH, T4TOTAL, FREET3, T3FREE, THYROIDAB,  in the last 72 hours ------------------------------------------------------------------------------------------------------------------ No results found for this basename: VITAMINB12, FOLATE, FERRITIN, TIBC, IRON, RETICCTPCT,  in the last 72 hours  Coagulation profile No results found for this basename: INR, PROTIME,  in the last 168 hours  No results found for this basename: DDIMER,  in the last 72 hours  Cardiac Enzymes No results found for this basename: CK, CKMB, TROPONINI, MYOGLOBIN,  in the last 168 hours ------------------------------------------------------------------------------------------------------------------ No components found with this basename: POCBNP,      Time Spent in minutes   35   Thurnell Lose M.D on 04/30/2014 at 9:21 AM  Between 7am to 7pm - Pager - [phone]  After 7pm go to [email or web address] - password [email or web address]  And look for the night coverage person covering for me after hours  [facility] Office  [phone]

## 2014-05-01 DIAGNOSIS — N390 Urinary tract infection, site not specified: Secondary | ICD-10-CM | POA: Diagnosis not present

## 2014-05-01 DIAGNOSIS — B961 Klebsiella pneumoniae [K. pneumoniae] as the cause of diseases classified elsewhere: Secondary | ICD-10-CM | POA: Diagnosis not present

## 2014-05-01 DIAGNOSIS — L02419 Cutaneous abscess of limb, unspecified: Secondary | ICD-10-CM | POA: Diagnosis not present

## 2014-05-01 DIAGNOSIS — M869 Osteomyelitis, unspecified: Secondary | ICD-10-CM | POA: Diagnosis not present

## 2014-05-01 DIAGNOSIS — E119 Type 2 diabetes mellitus without complications: Secondary | ICD-10-CM | POA: Diagnosis not present

## 2014-05-01 LAB — ANAEROBIC CULTURE: Gram Stain: NONE SEEN

## 2014-05-01 LAB — TISSUE CULTURE: Gram Stain: NONE SEEN

## 2014-05-01 LAB — GLUCOSE, CAPILLARY
GLUCOSE-CAPILLARY: 144 mg/dL — AB (ref 70–99)
GLUCOSE-CAPILLARY: 117 mg/dL — AB (ref 70–99)
GLUCOSE-CAPILLARY: 156 mg/dL — AB (ref 70–99)
GLUCOSE-CAPILLARY: 183 mg/dL — AB (ref 70–99)

## 2014-05-01 LAB — CULTURE, BLOOD (ROUTINE X 2)
Culture: NO GROWTH
Culture: NO GROWTH

## 2014-05-01 LAB — C-REACTIVE PROTEIN: CRP: 4.7 mg/dL — ABNORMAL HIGH (ref <0.60)

## 2014-05-01 MED ORDER — HYDROCODONE-ACETAMINOPHEN 5-325 MG PO TABS: 2.0000 | ORAL_TABLET | Freq: Four times a day (QID) | ORAL | Status: DC | PRN

## 2014-05-01 MED ORDER — HYDROCODONE-ACETAMINOPHEN 5-325 MG PO TABS
1.0000 | ORAL_TABLET | Freq: Four times a day (QID) | ORAL | Status: DC | PRN
  Administered 2014-05-01: 1 via ORAL
  Filled 2014-05-01: qty 1

## 2014-05-01 MED ORDER — CEFAZOLIN SODIUM 1-5 GM-% IV SOLN
1.0000 g | Freq: Three times a day (TID) | INTRAVENOUS | Status: DC
  Administered 2014-05-01 – 2014-05-02 (×4): 1 g via INTRAVENOUS
  Filled 2014-05-01 (×7): qty 50

## 2014-05-01 MED ORDER — MORPHINE SULFATE ER 15 MG PO TBCR
15.0000 mg | EXTENDED_RELEASE_TABLET | Freq: Two times a day (BID) | ORAL | Status: DC
  Administered 2014-05-01: 15 mg via ORAL
  Filled 2014-05-01: qty 1

## 2014-05-01 NOTE — Care Management Note (Signed)
    Page 1 of 1   05/01/2014     11:09:56 AM CARE MANAGEMENT NOTE 05/01/2014  Patient:  Kathryn Ryan, Kathryn Ryan   Account Number:  [id number]  Date Initiated:  05/01/2014  Documentation initiated by:  Magdalen Spatz  Subjective/Objective Assessment:     Action/Plan:   Anticipated DC Date:  05/02/2014   Anticipated DC Plan:  [place]         Choice offered to / List presented to:  C-1 Patient        [place] arranged  HH-1 RN  [place].   Status of service:  Completed, signed off Medicare Important Message given?   (If response is "NO", the following Medicare IM given date fields will be blank) Date Medicare IM given:   Date Additional Medicare IM given:    Discharge Disposition:    Per UR Regulation:  Reviewed for med. necessity/level of care/duration of stay  If discussed at [place] of Stay Meetings, dates discussed:    Comments:  05-01-14 Patient confirmed face sheet information . Patient lives alone . Patient aware PT recommended 24 hour assistance at home , however patient wants to go home alone , she has multiple friends /  neighbors to assist her. Magdalen Spatz RN SBN [phone]

## 2014-05-01 NOTE — ED Notes (Signed)
Pt to go home with [place], noted.  No other CSW needs identified.  CSW signing off.  Please re-consult as necessary.

## 2014-05-01 NOTE — Progress Notes (Signed)
Physical Therapy Treatment Patient Details Name: Kathryn Ryan MRN: [id number] DOB: [birth year] Today's Date: 05/01/2014    History of Present Illness Pt is a 73 y.o. 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     PT Comments    Pt demo'd safe stair negotiation and sequencing and safe use of RW during ambulation. Pt safe to d/c home once medically stable with 24/7 supervision and HHPT.  Follow Up Recommendations  Home health PT;Supervision/Assistance - 24 hour     Equipment Recommendations  None recommended by PT    Recommendations for Other Services       Precautions / Restrictions Precautions Precautions: Fall Required Braces or Orthoses: Other Brace/Splint Other Brace/Splint: CAM walker  Restrictions Weight Bearing Restrictions: No LLE Weight Bearing: Weight bearing as tolerated    Mobility  Bed Mobility Overal bed mobility: Modified Independent             General bed mobility comments: pt used bed rails to assist self back into bed  Transfers Overall transfer level: Needs assistance Equipment used: Rolling walker (2 wheeled) Transfers: Sit to/from Stand Sit to Stand: Min guard Stand pivot transfers: Min guard;From elevated surface       General transfer comment: pt with good hand position  Ambulation/Gait Ambulation/Gait assistance: Supervision Ambulation Distance (Feet): 100 Feet Assistive device: Rolling walker (2 wheeled)   Gait velocity: decreased   General Gait Details: pt requires 2 standing rest breaks   Stairs Stairs: Yes Stairs assistance: Min assist Stair Management: One rail Right (HHA on L to stimulate home set up of bilat HRs) Number of Stairs: 3 (limited by IV lines) General stair comments: pt with good sequencing  Wheelchair Mobility    Modified Rankin (Stroke Patients Only)       Balance                                    Cognition Arousal/Alertness:  Awake/alert Behavior During Therapy: WFL for tasks assessed/performed Overall Cognitive Status: Within Functional Limits for tasks assessed                      Exercises      General Comments General comments (skin integrity, edema, etc.): pt with request to use the bathroom, pt indep with hygiene and washed hands.      Pertinent Vitals/Pain Reports of rubbing of shin from CAM boot    Home Living                      Prior Function            PT Goals (current goals can now be found in the care plan section) Acute Rehab PT Goals PT Goal Formulation: With patient Time For Goal Achievement: 05/02/14 Potential to Achieve Goals: Good Progress towards PT goals: Progressing toward goals    Frequency  Min 4X/week    PT Plan Current plan remains appropriate    Co-evaluation             End of Session Equipment Utilized During Treatment: Gait belt Activity Tolerance: Patient tolerated treatment well Patient left: in bed;with call bell/phone within reach     Time: [id number] PT Time Calculation (min): 38 min  Charges:  $Gait Training: 23-37 mins $Therapeutic Activity: 8-22 mins  G Codes:      [place] 05/21/14, 2:34 PM   Kittie Plater, PT, DPT Pager #: [phone] Office #: [phone]

## 2014-05-01 NOTE — Progress Notes (Addendum)
INFECTIOUS DISEASE PROGRESS NOTE  ID: Kathryn Ryan is a 73 y.o. 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  Her brace is rubbing her L foot.   Abtx:  Anti-infectives   Start     Dose/Rate Route Frequency Ordered Stop   04/28/14 1400  vancomycin (VANCOCIN) 1,250 mg in sodium chloride 0.9 % 250 mL IVPB     1,250 mg 166.7 mL/hr over 90 Minutes Intravenous Every 12 hours 04/28/14 1327     04/26/14 1000  vancomycin (VANCOCIN) IVPB 1000 mg/200 mL premix  Status:  Discontinued     1,000 mg 200 mL/hr over 60 Minutes Intravenous Every 12 hours 04/25/14 2028 04/28/14 1326   04/25/14 2200  piperacillin-tazobactam (ZOSYN) IVPB 3.375 g     3.375 g 12.5 mL/hr over 240 Minutes Intravenous 3 times per day 04/25/14 2028 05/02/14 0559   04/25/14 2100  vancomycin (VANCOCIN) 2,000 mg in sodium chloride 0.9 % 500 mL IVPB     2,000 mg 250 mL/hr over 120 Minutes Intravenous NOW 04/25/14 2028 04/25/14 2322   04/25/14 1400  vancomycin (VANCOCIN) 2,000 mg in sodium chloride 0.9 % 500 mL IVPB  Status:  Discontinued     2,000 mg 250 mL/hr over 120 Minutes Intravenous  Once 04/25/14 1356 04/25/14 1515   04/25/14 1400  piperacillin-tazobactam (ZOSYN) IVPB 3.375 g     3.375 g 100 mL/hr over 30 Minutes Intravenous  Once 04/25/14 1356 04/25/14 1624      Medications:  Scheduled: . docusate sodium  200 mg Oral BID  . heparin subcutaneous  5,000 Units Subcutaneous 3 times per day  . insulin aspart  0-9 Units Subcutaneous TID WC  . insulin glargine  24 Units Subcutaneous Daily  . levothyroxine  100 mcg Oral QAC breakfast  . lisinopril  10 mg Oral Daily  . living well with diabetes book   Does not apply Once  . pantoprazole  40 mg Oral Daily  . piperacillin-tazobactam (ZOSYN)  IV  3.375 g Intravenous 3 times per day  .  polyethylene glycol  17 g Oral BID  . pregabalin  150 mg Oral BID  . senna  1 tablet Oral BID  . sodium chloride  10-40 mL Intracatheter Q12H  . vancomycin  1,250 mg Intravenous Q12H    Objective: Vital signs in last 24 hours: Temp:  [97.9 F (36.6 C)-98.1 F (36.7 C)] 98 F (36.7 C) (05/25 1334) Pulse Rate:  [77-92] 88 (05/25 1334) Resp:  [17-18] 18 (05/25 1334) BP: (128-147)/(65-72) 138/65 mmHg (05/25 1334) SpO2:  [93 %-98 %] 98 % (05/25 1334)   General appearance: alert, cooperative and no distress Extremities: L foot wrapped, RUE PIC clean.   Lab Results  Recent Labs  04/30/14 0545  K 3.9   Liver Panel No results found for this basename: PROT, ALBUMIN, AST, ALT, ALKPHOS, BILITOT, BILIDIR, IBILI,  in the last 72 hours Sedimentation Rate  Recent Labs  04/30/14 1500  ESRSEDRATE 121*   C-Reactive Protein  Recent Labs  04/30/14 1500  CRP 4.7*    Microbiology: Recent Results (from the past 240 hour(s))  URINE CULTURE     Status: None   Collection Time    04/25/14 12:55 PM      Result Value Ref Range Status   Specimen Description URINE, CLEAN CATCH  Final   Special Requests NONE   Final   Culture  Setup Time     Final   Value: 04/25/2014 19:34     Performed at [facility] Count     Final   Value: >=100,000 COLONIES/ML     Performed at [facility]   Culture     Final   Value: KLEBSIELLA PNEUMONIAE     Performed at [facility]   Report Status 04/28/2014 FINAL   Final   Organism ID, Bacteria KLEBSIELLA PNEUMONIAE   Final  CULTURE, BLOOD (ROUTINE X 2)     Status: None   Collection Time    04/25/14  4:22 PM      Result Value Ref Range Status   Specimen Description BLOOD RIGHT ARM   Final   Special Requests BOTTLES DRAWN AEROBIC AND ANAEROBIC 10CC   Final   Culture  Setup Time     Final   Value: 04/25/2014 20:01     Performed at [facility]   Culture     Final   Value: NO GROWTH 5 DAYS     Performed at  [facility]   Report Status 05/01/2014 FINAL   Final  CULTURE, BLOOD (ROUTINE X 2)     Status: None   Collection Time    04/25/14  4:39 PM      Result Value Ref Range Status   Specimen Description BLOOD ARM LEFT   Final   Special Requests BOTTLES DRAWN AEROBIC AND ANAEROBIC 5CC   Final   Culture  Setup Time     Final   Value: 04/25/2014 23:20     Performed at [facility]   Culture     Final   Value: NO GROWTH 5 DAYS     Performed at [facility]   Report Status 05/01/2014 FINAL   Final  BODY FLUID CULTURE     Status: None   Collection Time    04/25/14  8:03 PM      Result Value Ref Range Status   Specimen Description SYNOVIAL FLUID ANKLE LEFT   Final   Special Requests Normal   Final   Gram Stain     Final   Value: FEW WBC PRESENT,BOTH PMN AND MONONUCLEAR     NO ORGANISMS SEEN     Performed at [facility]   Culture     Final   Value: NO GROWTH 3 DAYS     Performed at [facility]   Report Status 04/29/2014 FINAL   Final  URINE CULTURE     Status: None   Collection Time    04/25/14  8:38 PM      Result Value Ref Range Status   Specimen Description URINE, RANDOM   Final   Special Requests NONE   Final   Culture  Setup Time     Final   Value: 04/26/2014 03:25     Performed at [facility]     Final   Value: NO GROWTH     Performed at [facility]   Culture     Final   Value: NO GROWTH     Performed at [facility]   Report Status 04/26/2014 FINAL   Final  SURGICAL PCR SCREEN     Status: None   Collection Time    04/26/14  5:51 AM      Result Value Ref Range  Status   MRSA, PCR NEGATIVE  NEGATIVE Final   Staphylococcus aureus NEGATIVE  NEGATIVE Final   Comment:            The Xpert SA Assay (FDA     approved for NASAL specimens     in patients over 29 years of age),     is one component of     a comprehensive surveillance     program.  Test performance has     been validated by  [employer] for patients greater     than or equal to 110 year old.     It is not intended     to diagnose infection nor to     guide or monitor treatment.  ANAEROBIC CULTURE     Status: None   Collection Time    04/26/14  2:03 PM      Result Value Ref Range Status   Specimen Description WOUND ANKLE LEFT   Final   Special Requests PATIENT ON FOLLOWING VANCOMYCIN   Final   Gram Stain     Final   Value: NO WBC SEEN     NO ORGANISMS SEEN     Performed at [facility]   Culture     Final   Value: NO ANAEROBES ISOLATED; CULTURE IN PROGRESS FOR 5 DAYS     Performed at [facility]   Report Status PENDING   Incomplete  TISSUE CULTURE     Status: None   Collection Time    04/26/14  2:03 PM      Result Value Ref Range Status   Specimen Description TISSUE LEFT ANKLE   Final   Special Requests PATIENT ON FOLLOWING VANCOMYCIN   Final   Gram Stain     Final   Value: NO WBC SEEN     NO ORGANISMS SEEN     Performed at [facility]   Culture     Final   Value: FEW STAPHYLOCOCCUS SPECIES (COAGULASE NEGATIVE)     Note: RIFAMPIN AND GENTAMICIN SHOULD NOT BE USED AS SINGLE DRUGS FOR TREATMENT OF STAPH INFECTIONS.     Performed at [facility]   Report Status 05/01/2014 FINAL   Final   Organism ID, Bacteria STAPHYLOCOCCUS SPECIES (COAGULASE NEGATIVE)   Final    Studies/Results: No results found.   Assessment/Plan: Osteomyelitis L foot/ankle- MSSE DM2 (new)  UTI- kleb pneumo  Total days of antibiotics: 7 vanco/zosyn  Diabetic control Will change her to Ancef Plan for 6 weeks of anbx Weekly CBC, CMP, ESR, CRP faxed to [phone] Will see her back in clinic in 3 weeks     NEEDS Orthotics to address her brace prior to d/c        [facility] (pager) [phone] [email or web address] 05/01/2014, 2:33 PM  LOS: 6 days   **Disclaimer: This note may have been dictated with voice recognition software. Similar sounding words  can inadvertently be transcribed and this note may contain transcription errors which may not have been corrected upon publication of note.**

## 2014-05-01 NOTE — Progress Notes (Signed)
Patient Demographics  Kathryn Ryan, is a 73 y.o. female, DOB - 1941-12-05, IHK:[id number]  Admit date - 04/25/2014   Admitting Physician Verlee Monte, MD  Outpatient Primary MD for the patient is Gar Ponto, MD  LOS - 6   Chief Complaint  Patient presents with  . 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 Wylene Simmer on 04/26/2014, on vancomycin for this purpose Zosyn stopped on 05/01/2014 by ID. ID following.    Weight bearing per orthopedics, patient has a PICC line in the right arm. Will defer duration and course of antibiotics per infectious disease. We'll likely discharge in the morning on IV antibiotics as suggested by ID. So far growing coag-negative staph will await final sensitivity data.    UTI?   NOTED urine cultures showing Klebsiella, finish treatment with Zosyn today.     Hyperglycemia   Patient has no documented history of DM. Newly diagnosed type 2 diabetes mellitus this admission. On SSI , added Lantus for better control will increase to 24 U on 04-29-14. We'll provide her with diabetic education.    Lab Results  Component Value Date   HGBA1C 7.0* 04/25/2014    CBG (last 3)   Recent Labs  04/30/14 1717 04/30/14 2126 05/01/14 0819  GLUCAP 158* 183* 144*      Hypothyroid  Will continue Synthroid     HTN  Holding HCTZ.  Will continue Lisinopril.       Neuropathy  Has a  history of claudication with 2 spinal surgeries  Continue Lyrica   .  GERD  Continue PPI    Low K - replaced & stable      Code Status: Full  Family Communication: none present  Disposition Plan: TBD   Procedures    04/26/2014 by Dr. Doran Durand   1. Removal of deep implants from talonavicular joint medially  2. Irrigation and debridement of left foot abscesses in multiple locations  3. Removal of deep implants from calcaneocuboid joint laterally (separate incision)  4. Left ankle arthrotomy with irrigation and debridement    Consults  Otho, ID   Medications  Scheduled Meds: . docusate sodium  200 mg Oral BID  . heparin subcutaneous  5,000 Units Subcutaneous 3 times per day  . HYDROcodone-acetaminophen  2 tablet Oral QHS  . insulin aspart  0-9 Units Subcutaneous TID WC  . insulin glargine  24 Units Subcutaneous Daily  . levothyroxine  100 mcg Oral QAC breakfast  . lisinopril  10 mg Oral Daily  . living well with diabetes book   Does not apply Once  . morphine  15 mg Oral Q12H  . pantoprazole  40 mg Oral Daily  . piperacillin-tazobactam (ZOSYN)  IV  3.375 g Intravenous 3 times per day  . polyethylene glycol  17 g Oral BID  . pregabalin  150 mg Oral BID  . senna  1 tablet Oral BID  . sodium chloride  10-40 mL Intracatheter Q12H  . vancomycin  1,250 mg Intravenous Q12H   Continuous Infusions:   PRN Meds:.acetaminophen, alum & mag hydroxide-simeth, hydrALAZINE, ibuprofen, metoprolol, ondansetron (ZOFRAN) IV, sodium chloride  DVT Prophylaxis Heparin  Lab Results  Component Value Date   PLT 378 04/28/2014    Antibiotics     Anti-infectives   Start     Dose/Rate Route Frequency Ordered Stop   04/28/14 1400  vancomycin (VANCOCIN) 1,250 mg in sodium chloride 0.9 % 250 mL IVPB     1,250 mg 166.7 mL/hr over 90 Minutes Intravenous Every 12 hours 04/28/14 1327     04/26/14 1000  vancomycin (VANCOCIN) IVPB 1000 mg/200 mL premix  Status:  Discontinued     1,000  mg 200 mL/hr over 60 Minutes Intravenous Every 12 hours 04/25/14 2028 04/28/14 1326   04/25/14 2200  piperacillin-tazobactam (ZOSYN) IVPB 3.375 g     3.375 g 12.5 mL/hr over 240 Minutes Intravenous 3 times per day 04/25/14 2028     04/25/14 2100  vancomycin (VANCOCIN) 2,000 mg in sodium chloride 0.9 % 500 mL IVPB     2,000 mg 250 mL/hr over 120 Minutes Intravenous NOW 04/25/14 2028 04/25/14 2322   04/25/14 1400  vancomycin (VANCOCIN) 2,000 mg in sodium chloride 0.9 % 500 mL IVPB  Status:  Discontinued     2,000 mg 250 mL/hr over 120 Minutes Intravenous  Once 04/25/14 1356 04/25/14 1515   04/25/14 1400  piperacillin-tazobactam (ZOSYN) IVPB 3.375 g     3.375 g 100 mL/hr over 30 Minutes Intravenous  Once 04/25/14 1356 04/25/14 1624          Subjective:    [name] today has, mild generalised headache, No chest pain, No abdominal pain - No Nausea, No new weakness tingling or numbness, No Cough - SOB. No pain in the left ankle. Feels better and wants to go home tomorrow.   Objective:   Filed Vitals:   04/30/14 1405 04/30/14 2133 05/01/14 0609 05/01/14 1004  BP: 142/89 131/65 147/67 128/72  Pulse: 93 92 77   Temp: 98.3 F (36.8 C) 98.1 F (36.7 C) 97.9 F (36.6 C)   TempSrc: Oral Oral Oral   Resp: 18 17 17    Height:      Weight:      SpO2: 93% 95% 93%     Wt Readings from Last 3 Encounters:  04/25/14 106.595 kg (235 lb)  04/25/14 106.595 kg (235 lb)  02/27/12 107.1 kg (236 lb 1.8 oz)     Intake/Output Summary (Last 24 hours) at 05/01/14 1107 Last data filed at 05/01/14 0915  Gross per 24 hour  Intake   1060 ml  Output      0 ml  Net   1060 ml     Physical Exam  Awake Alert, Oriented X 3, No new F.N deficits, Normal affect [place].AT,PERRAL Supple Neck,No JVD, No cervical lymphadenopathy appriciated.  Symmetrical Chest wall movement, Good air movement bilaterally, CTAB RRR,No Gallops,Rubs or new Murmurs, No Parasternal Heave +ve B.Sounds, Abd Soft, Non  tender, No organomegaly appriciated, No rebound - guarding or rigidity. No Cyanosis, Clubbing or edema, No new Rash or bruise, Left ankle has a Cam walker boot, drains are out, good range of motion and sensation in toes    Data Review   Micro Results Recent Results (from the past 240 hour(s))  URINE CULTURE     Status: None   Collection Time  04/25/14 12:55 PM      Result Value Ref Range Status   Specimen Description URINE, CLEAN CATCH   Final   Special Requests NONE   Final   Culture  Setup Time     Final   Value: 04/25/2014 19:34     Performed at [place]     Final   Value: >=100,000 COLONIES/ML     Performed at [facility]   Culture     Final   Value: KLEBSIELLA PNEUMONIAE     Performed at [facility]   Report Status 04/28/2014 FINAL   Final   Organism ID, Bacteria KLEBSIELLA PNEUMONIAE   Final  CULTURE, BLOOD (ROUTINE X 2)     Status: None   Collection Time    04/25/14  4:22 PM      Result Value Ref Range Status   Specimen Description BLOOD RIGHT ARM   Final   Special Requests BOTTLES DRAWN AEROBIC AND ANAEROBIC 10CC   Final   Culture  Setup Time     Final   Value: 04/25/2014 20:01     Performed at [facility]   Culture     Final   Value: NO GROWTH 5 DAYS     Performed at [facility]   Report Status 05/01/2014 FINAL   Final  CULTURE, BLOOD (ROUTINE X 2)     Status: None   Collection Time    04/25/14  4:39 PM      Result Value Ref Range Status   Specimen Description BLOOD ARM LEFT   Final   Special Requests BOTTLES DRAWN AEROBIC AND ANAEROBIC 5CC   Final   Culture  Setup Time     Final   Value: 04/25/2014 23:20     Performed at [facility]   Culture     Final   Value: NO GROWTH 5 DAYS     Performed at [facility]   Report Status 05/01/2014 FINAL   Final  BODY FLUID CULTURE     Status: None   Collection Time    04/25/14  8:03 PM      Result Value Ref Range Status   Specimen  Description SYNOVIAL FLUID ANKLE LEFT   Final   Special Requests Normal   Final   Gram Stain     Final   Value: FEW WBC PRESENT,BOTH PMN AND MONONUCLEAR     NO ORGANISMS SEEN     Performed at [facility]   Culture     Final   Value: NO GROWTH 3 DAYS     Performed at [facility]   Report Status 04/29/2014 FINAL   Final  URINE CULTURE     Status: None   Collection Time    04/25/14  8:38 PM      Result Value Ref Range Status   Specimen Description URINE, RANDOM   Final   Special Requests NONE   Final   Culture  Setup Time     Final   Value: 04/26/2014 03:25     Performed at [place]     Final   Value: NO GROWTH     Performed at [facility]   Culture     Final   Value: NO GROWTH     Performed at [facility]   Report Status 04/26/2014 FINAL   Final  SURGICAL PCR SCREEN  Status: None   Collection Time    04/26/14  5:51 AM      Result Value Ref Range Status   MRSA, PCR NEGATIVE  NEGATIVE Final   Staphylococcus aureus NEGATIVE  NEGATIVE Final   Comment:            The Xpert SA Assay (FDA     approved for NASAL specimens     in patients over 68 years of age),     is one component of     a comprehensive surveillance     program.  Test performance has     been validated by [employer] for patients greater     than or equal to 21 year old.     It is not intended     to diagnose infection nor to     guide or monitor treatment.  ANAEROBIC CULTURE     Status: None   Collection Time    04/26/14  2:03 PM      Result Value Ref Range Status   Specimen Description WOUND ANKLE LEFT   Final   Special Requests PATIENT ON FOLLOWING VANCOMYCIN   Final   Gram Stain     Final   Value: NO WBC SEEN     NO ORGANISMS SEEN     Performed at [facility]   Culture     Final   Value: NO ANAEROBES ISOLATED; CULTURE IN PROGRESS FOR 5 DAYS     Performed at [facility]   Report Status PENDING   Incomplete    TISSUE CULTURE     Status: None   Collection Time    04/26/14  2:03 PM      Result Value Ref Range Status   Specimen Description TISSUE LEFT ANKLE   Final   Special Requests PATIENT ON FOLLOWING VANCOMYCIN   Final   Gram Stain     Final   Value: NO WBC SEEN     NO ORGANISMS SEEN     Performed at [facility]   Culture     Final   Value: FEW STAPHYLOCOCCUS SPECIES (COAGULASE NEGATIVE)     Note: RIFAMPIN AND GENTAMICIN SHOULD NOT BE USED AS SINGLE DRUGS FOR TREATMENT OF STAPH INFECTIONS.     Performed at [facility]   Report Status 05/01/2014 FINAL   Final   Organism ID, Bacteria STAPHYLOCOCCUS SPECIES (COAGULASE NEGATIVE)   Final     Radiology Reports Dg Ankle Complete Left  04/25/2014   CLINICAL DATA:  Wound infection  EXAM: LEFT ANKLE COMPLETE - 3+ VIEW  COMPARISON:  None.  FINDINGS: Frontal, oblique, and lateral views were obtained. Two screws transfix with the hindfoot, including the anterior subtalar joint from a medial approach. Both screws have fractures in their respective mid portions.  There is marked soft tissue swelling throughout the ankle joint with foci of soft tissue air medially concerning for abscess. No acute fracture is seen. The ankle mortise appears intact. There is fluid in the joint. 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 There also surgical screws transfixing the calcaneocuboid joint region.  IMPRESSION: Marked soft tissue swelling with air medially. Suspect abscess within the soft tissues surrounding the ankle and hindfoot. There is postoperative change with 2 screws transfixing the anterior subtalar joint. There are fractures in the mid portions of each screw. There are also screws to the calcaneocuboid joint. The screws do not appear fractured. No acute fracture. Joint effusion which could  potentially represent infection within the joint.  Given these findings, it may be prudent to consider contrast enhanced CT to further evaluate.  Critical  Value/emergent results were called by telephone at the time of interpretation on 04/25/2014 at 1:33 PM to Dr. Francine Graven , who verbally acknowledged these results.   Electronically Signed   By: Lowella Grip M.D.   On: 04/25/2014 13:34   Mr Ankle Left W Wo Contrast  04/25/2014   CLINICAL DATA:  Left foot pain and swelling. History of prior surgery.  EXAM: MRI OF THE LEFT ANKLE WITHOUT AND WITH CONTRAST  TECHNIQUE: Multiplanar, multisequence MR imaging of the ankle was performed before and after the administration of intravenous contrast.  CONTRAST:  1 MULTIHANCE GADOBENATE DIMEGLUMINE 529 MG/ML IV SOLN  COMPARISON:  Radiographs 04/25/2014  FINDINGS: Examination is limited by patient motion and artifact from hardware.  Hardware is noted in the midfoot with fractured screws noted on the radiographs. There is diffuse soft tissue swelling/edema and multiple rim enhancing subcutaneous fluid collections suspicious for abscesses. The largest cyst along the lateral malleolus. There is also a large joint effusion involving the tibiotalar joint with thick rim enhancing synovium. Could not exclude septic arthritis. Recommend joint aspiration. It is difficult to evaluate for osteomyelitis because of the significant artifact. Severe talonavicular joint degenerative changes are noted with moderate subluxation. Rim enhancing joint effusion is noted here also.  IMPRESSION: IMPRESSION Medial and lateral soft tissue abscesses.  Joint effusions and marked synovial enhancement worrisome for septic arthritis. This mainly involves the tibiotalar joint and talonavicular joint.  Limited examination for osteomyelitis due to artifact and patient motion.   Electronically Signed   By: Kalman Jewels M.D.   On: 04/25/2014 16:36   Dg Fluoro Guide Ndl Plc/bx  04/25/2014   CLINICAL DATA:  73-YEAR-OLD FEMALE WITH SUSPECTED SEPTIC LEFT ANKLE.  EXAM: LEFT ANKLE JOINT ASPIRATION  TECHNIQUE: Written informed consent was obtained from  the patient following explanation of the risks, benefits and alternatives. The ankle was prepped and draped in the usual fashion using Betadine skin prep. Localization was performed under fluoroscopy. A 20 gauge spinal needle was advanced into the tibiotalar joint and a small volume of turbid bloody fluid was aspirated. An 18 gauge needle was also advanced into an area of fluctuance overlying the lateral malleolus. Approximately 1 cc of purulent fluid was successfully aspirated.  Hemostasis was attained by gentle manual pressure and Band-Aids were applied. The patient tolerated the procedure well. There was no immediate complication.  FLUOROSCOPY TIME:  37 seconds  COMPARISON:  Ankle MRI 04/25/2014  FINDINGS: Successful aspiration of a small amount of purulent/bloody fluid from the tibiotalar joint and fluid overlying the lateral malleolus.  IMPRESSION: Successful aspiration of a small amount of purulent/bloody fluid from the tibiotalar joint and fluid overlying the lateral malleolus.  Fluid sent for Gram stain and culture.   Electronically Signed   By: Jacqulynn Cadet M.D.   On: 04/25/2014 21:26    CBC  Recent Labs Lab 04/25/14 1229 04/26/14 0525 04/28/14 0530  WBC 9.6 9.2 7.7  HGB 10.2* 11.0* 10.3*  HCT 30.9* 34.0* 32.2*  PLT 315 340 378  MCV 91.4 94.4 94.2  MCH 30.2 30.6 30.1  MCHC 33.0 32.4 32.0  RDW 15.2 15.3 15.5  LYMPHSABS 1.2  --   --   MONOABS 0.8  --   --   EOSABS 0.0  --   --   BASOSABS 0.0  --   --  Chemistries   Recent Labs Lab 04/25/14 1229 04/26/14 0525 04/28/14 0530 04/28/14 1105 04/29/14 0500 04/30/14 0545  NA 134* 143 142  --   --   --   K 3.5* 4.2 3.4* 3.6*  --  3.9  CL 92* 98 99  --   --   --   CO2 30 33* 31  --   --   --   GLUCOSE 238* 203* 172*  --   --   --   BUN 16 17 14   --   --   --   CREATININE 0.82 0.84 0.88  --   --   --   CALCIUM 9.1 9.7 8.6  --   --   --   MG  --   --   --   --  2.1  --   AST 15  --   --   --   --   --   ALT 12  --   --    --   --   --   ALKPHOS 142*  --   --   --   --   --   BILITOT 0.3  --   --   --   --   --    ------------------------------------------------------------------------------------------------------------------ estimated creatinine clearance is 71.3 ml/min (by C-G formula based on Cr of 0.88). ------------------------------------------------------------------------------------------------------------------ No results found for this basename: HGBA1C,  in the last 72 hours ------------------------------------------------------------------------------------------------------------------ No results found for this basename: CHOL, HDL, LDLCALC, TRIG, CHOLHDL, LDLDIRECT,  in the last 72 hours ------------------------------------------------------------------------------------------------------------------ No results found for this basename: TSH, T4TOTAL, FREET3, T3FREE, THYROIDAB,  in the last 72 hours ------------------------------------------------------------------------------------------------------------------ No results found for this basename: VITAMINB12, FOLATE, FERRITIN, TIBC, IRON, RETICCTPCT,  in the last 72 hours  Coagulation profile No results found for this basename: INR, PROTIME,  in the last 168 hours  No results found for this basename: DDIMER,  in the last 72 hours  Cardiac Enzymes No results found for this basename: CK, CKMB, TROPONINI, MYOGLOBIN,  in the last 168 hours ------------------------------------------------------------------------------------------------------------------ No components found with this basename: POCBNP,      Time Spent in minutes   35   Thurnell Lose M.D on 05/01/2014 at 11:07 AM  Between 7am to 7pm - Pager - [phone]  After 7pm go to [email or web address] - password [email or web address]  And look for the night coverage person covering for me after hours  [facility] Office  [phone]

## 2014-05-01 NOTE — Progress Notes (Signed)
Inpatient Diabetes Program Recommendations  AACE/ADA: New Consensus Statement on Inpatient Glycemic Control (2013)  Target Ranges:  Prepandial:   less than 140 mg/dL      Peak postprandial:   less than 180 mg/dL (1-2 hours)      Critically ill patients:  140 - 180 mg/dL    Referral received for insulin teaching. Pt's A1C is 7.0%.  I fear that although lantus is effective presently, it may pose problems with hypoglycemia after discharge.  Typically with an A1C < 7.5 %, the the ADA/EASD position statement algorithm suggests using either Metformin with either a  GLP-1, SGLT-2 inhibitor, a Meglitinide or sulfonylurea or DPP4 inhibitor or a GLP-1. In this case, I would recommend using a SGLT-2 inhibitor which works with the kidneys in increasing the threshold for glucose filtration.  It works well in combination with Metformin XR.   Would suggest using the SGLT-2 Invokana or [place]..  I have a savings card for one free month of Invokana.-can start with 100 mg once daily before the first meal. Can increase up to 300 mg/day.  It has been found to be extremely effective. (Her renal function is good, thus there are no contraindications) If insulin is still the drug of choice, insulin instruction can be done per bedside RN or per coordinator for pen.  Thank you, Rosita Kea, RN, CNS, Diabetes Coordinator [phone])

## 2014-05-01 NOTE — Progress Notes (Signed)
Orthopedics Progress Note  Subjective: It feels swollen today and more painful  Objective:  Filed Vitals:   05/01/14 0609  BP: 147/67  Pulse: 77  Temp: 97.9 F (36.6 C)  Resp: 17    General: Awake and alert  Musculoskeletal: incisions clean and dry with no erythema and no drainage.  Dressing replaced and leg elevated in a recliner Neurovascularly intact  Lab Results  Component Value Date   WBC 7.7 04/28/2014   HGB 10.3* 04/28/2014   HCT 32.2* 04/28/2014   MCV 94.2 04/28/2014   PLT 378 04/28/2014       Component Value Date/Time   NA 142 04/28/2014 0530   K 3.9 04/30/2014 0545   CL 99 04/28/2014 0530   CO2 31 04/28/2014 0530   GLUCOSE 172* 04/28/2014 0530   BUN 14 04/28/2014 0530   CREATININE 0.88 04/28/2014 0530   CALCIUM 8.6 04/28/2014 0530   GFRNONAA 64* 04/28/2014 0530   GFRAA 74* 04/28/2014 0530    No results found for this basename: INR, PROTIME    Assessment/Plan: POD #2 s/p Procedure(s): HARDWARE REMOVAL LEFT ANKLE IRRIGATION AND DEBRIDEMENT LEFT ANKLE I stressed to Ms Benard the importance of keeping this ankle elevated as she was up moving around on it when I cam in.  The swelling to me looks acceptable but she needs to really elevate the extremity. Abx per ID Understand D/C may be tomorrow. Follow up with Dr Doran Durand in the clinic in 7-10 days  Doran Heater. Veverly Fells, MD 05/01/2014 9:16 AM

## 2014-05-02 DIAGNOSIS — G609 Hereditary and idiopathic neuropathy, unspecified: Secondary | ICD-10-CM | POA: Diagnosis not present

## 2014-05-02 DIAGNOSIS — M009 Pyogenic arthritis, unspecified: Secondary | ICD-10-CM | POA: Diagnosis not present

## 2014-05-02 DIAGNOSIS — L03119 Cellulitis of unspecified part of limb: Secondary | ICD-10-CM | POA: Diagnosis not present

## 2014-05-02 DIAGNOSIS — L02619 Cutaneous abscess of unspecified foot: Secondary | ICD-10-CM | POA: Diagnosis not present

## 2014-05-02 DIAGNOSIS — Z4789 Encounter for other orthopedic aftercare: Secondary | ICD-10-CM | POA: Diagnosis not present

## 2014-05-02 DIAGNOSIS — Z452 Encounter for adjustment and management of vascular access device: Secondary | ICD-10-CM | POA: Diagnosis not present

## 2014-05-02 DIAGNOSIS — IMO0001 Reserved for inherently not codable concepts without codable children: Secondary | ICD-10-CM | POA: Diagnosis not present

## 2014-05-02 DIAGNOSIS — I10 Essential (primary) hypertension: Secondary | ICD-10-CM | POA: Diagnosis not present

## 2014-05-02 DIAGNOSIS — L02419 Cutaneous abscess of limb, unspecified: Secondary | ICD-10-CM | POA: Diagnosis not present

## 2014-05-02 LAB — GLUCOSE, CAPILLARY
Glucose-Capillary: 139 mg/dL — ABNORMAL HIGH (ref 70–99)
Glucose-Capillary: 109 mg/dL — ABNORMAL HIGH (ref 70–99)

## 2014-05-02 MED ORDER — INSULIN ASPART 100 UNIT/ML ~~LOC~~ SOLN: SUBCUTANEOUS | Status: DC

## 2014-05-02 MED ORDER — HYDROCODONE-ACETAMINOPHEN 5-500 MG PO TABS: 2.0000 | ORAL_TABLET | Freq: Every day | ORAL | Status: DC

## 2014-05-02 MED ORDER — FREESTYLE SYSTEM KIT: 1.0000 | PACK | Freq: Three times a day (TID) | Status: DC

## 2014-05-02 MED ORDER — INSULIN GLARGINE 100 UNIT/ML ~~LOC~~ SOLN: 24.0000 [IU] | Freq: Every day | SUBCUTANEOUS | Status: DC

## 2014-05-02 MED ORDER — POLYETHYLENE GLYCOL 3350 17 G PO PACK: 17.0000 g | PACK | Freq: Two times a day (BID) | ORAL | Status: DC

## 2014-05-02 MED ORDER — CEFAZOLIN SODIUM 1-5 GM-% IV SOLN: 1.0000 g | Freq: Three times a day (TID) | INTRAVENOUS | Status: DC

## 2014-05-02 MED ORDER — HEPARIN SOD (PORK) LOCK FLUSH 100 UNIT/ML IV SOLN
250.0000 [IU] | INTRAVENOUS | Status: AC | PRN
  Administered 2014-05-02: 250 [IU]

## 2014-05-02 NOTE — Discharge Summary (Signed)
Kathryn Ryan, is a 74 y.o. female  DOB 1941/08/26  MRN [id number].  Admission date:  04/25/2014  Admitting Physician  Verlee Monte, MD  Discharge Date:  05/02/2014   Primary MD  Gar Ponto, MD  Recommendations for primary care physician for things to follow:   Follow CBC BMP, A1c and glycemic control.   Admission Diagnosis  Septic arthritis [711.00] HTN (hypertension) [401.9] Hyperglycemia [790.29] Diabetes mellitus type II, uncontrolled [250.02] Abscess of left foot [682.7] Left ankle effusion [719.07] Cellulitis of left ankle [682.6] Acute osteomyelitis [730.00]   Discharge Diagnosis  Septic arthritis [711.00] HTN (hypertension) [401.9] Hyperglycemia [790.29] Diabetes mellitus type II, uncontrolled [250.02] Abscess of left foot [682.7] Left ankle effusion [719.07] Cellulitis of left ankle [682.6] Acute osteomyelitis [730.00]   DM type II  Principal Problem:   Septic arthritis Active Problems:   Cellulitis of left ankle   Acute osteomyelitis   Hyperglycemia   HTN (hypertension)   Infected hardware in left leg      Past Medical History  Diagnosis Date  . Hypertension   . Hypothyroidism   . GERD (gastroesophageal reflux disease)   . H/O hiatal hernia   . Neuropathy     non related to DM-not sure why  . Neuropathy     non DM related  . Anxiety   . Spinal stenosis of lumbar region   . Lumbar radiculopathy   . Diabetes mellitus without complication     new onset diabetic     Past Surgical History  Procedure Laterality Date  . Back surgery    . Abdominal hysterectomy    . Bunionectomy      bilateral  . Cholecystectomy    . Breast surgery      bilateral lumpectomies  . Left oophorectomy    . Tonsillectomy      7 yrs  . Peripherally inserted central catheter insertion  04/26/2014    rt upper  arm  . Irrigation and debridement abscess Left 04/26/2014  . Hardware removal Left 04/26/2014    Procedure: HARDWARE REMOVAL LEFT ANKLE;  Surgeon: Wylene Simmer, MD;  Location: [facility];  Service: Orthopedics;  Laterality: Left;  . I&d extremity Left 04/26/2014    Procedure: IRRIGATION AND DEBRIDEMENT LEFT ANKLE;  Surgeon: Wylene Simmer, MD;  Location: [facility];  Service: Orthopedics;  Laterality: Left;     Discharge Condition: Stable   Follow UP  Follow-up Information   Follow up with Wylene Simmer, MD In 10 days.   Specialty:  Orthopedic Surgery   Contact information:   [address] [place] [address] [phone]       Follow up with Bobby Rumpf, MD In 2 weeks.   Specialty:  Infectious Diseases   Contact information:   [address] [place] [address] [phone]       Follow up with Gar Ponto, MD. Schedule an appointment as soon as possible for a visit in 1 week.   Specialty:  Family Medicine   Contact information:  [address] [place] [address] [phone]         Discharge Instructions  and  Discharge Medications     Discharge Instructions   Ambulatory referral to Nutrition and Diabetic Education    Complete by:  As directed   In for Ortho surgery.  New diagnosis of DM made.  Likely to go home on insulin per MD notes.  A1c 7%.  Patient: Please call the [facility] after discharge to schedule an appointment for diabetes education if you do not hear from the center before discharge  [phone]     Discharge instructions    Complete by:  As directed   Change your dressing daily with 4x4 gauze, Kerlix and ace wrap.  You may bear weight in the CAM boot.  You may remove the boot and dressing to shower, but don't soak the foot.  You may remove the boot to use the knee scooter as needed.  Follow with Primary MD Gar Ponto, MD in 7 days   Get CBC, CMP, checked 7 days by Primary MD  and again as instructed by your Primary MD.    Activity: As tolerated with Full fall precautions use walker/cane & assistance as needed  Accuchecks 4 times/day, Once in AM empty stomach and then before each meal. Log in all results and show them to your Prim.MD in 3 days. If any glucose reading is under 80 or above 300 call your Prim MD immidiately. Follow Low glucose instructions for glucose under 80 as instructed.   Disposition Home    Diet: Heart Healthy Low Carb   For Heart failure patients - Check your Weight same time everyday, if you gain over 2 pounds, or you develop in leg swelling, experience more shortness of breath or chest pain, call your Primary MD immediately. Follow Cardiac Low Salt Diet and 1.8 lit/day fluid restriction.   On your next visit with her primary care physician please Get Medicines reviewed and adjusted.  Please request your Prim.MD to go over all Hospital Tests and Procedure/Radiological results at the follow up, please get all Hospital records sent to your Prim MD by signing hospital release before you go home.   If you experience worsening of your admission symptoms, develop shortness of breath, life threatening emergency, suicidal or homicidal thoughts you must seek medical attention immediately by calling 911 or calling your MD immediately  if symptoms less severe.  You Must read complete instructions/literature along with all the possible adverse reactions/side effects for all the Medicines you take and that have been prescribed to you. Take any new Medicines after you have completely understood and accpet all the possible adverse reactions/side effects.   Do not drive and provide baby sitting services if your were admitted for syncope or siezures until you have seen by Primary MD or a Neurologist and advised to do so again.  Do not drive when taking Pain medications.    Do not take more than prescribed Pain, Sleep and Anxiety Medications  Special  Instructions: If you have smoked or chewed Tobacco  in the last 2 yrs please stop smoking, stop any regular Alcohol  and or any Recreational drug use.  Wear Seat belts while driving.   Please note  You were cared for by a hospitalist during your hospital stay. If you have any questions about your discharge medications or the care you received while you were in the hospital after you are discharged, you can call  the unit and asked to speak with the hospitalist on call if the hospitalist that took care of you is not available. Once you are discharged, your primary care physician will handle any further medical issues. Please note that NO REFILLS for any discharge medications will be authorized once you are discharged, as it is imperative that you return to your primary care physician (or establish a relationship with a primary care physician if you do not have one) for your aftercare needs so that they can reassess your need for medications and monitor your lab values.  Change your dressing daily with 4x4 gauze, Kerlix and ace wrap.  You may bear weight in the CAM boot.  You may remove the boot and dressing to shower, but don't soak the foot.  You may remove the boot to use the knee scooter as needed.  Follow with Primary MD Gar Ponto, MD in 7 days   Get CBC, CMP, checked 7 days by Primary MD and again as instructed by your Primary MD.    Activity: As tolerated with Full fall precautions use walker/cane & assistance as needed  Accuchecks 4 times/day, Once in AM empty stomach and then before each meal. Log in all results and show them to your Prim.MD in 3 days. If any glucose reading is under 80 or above 300 call your Prim MD immidiately. Follow Low glucose instructions for glucose under 80 as instructed.   Disposition Home    Diet: Heart Healthy Low Carb   For Heart failure patients - Check your Weight same time everyday, if you gain over 2 pounds, or you develop in leg swelling,  experience more shortness of breath or chest pain, call your Primary MD immediately. Follow Cardiac Low Salt Diet and 1.8 lit/day fluid restriction.   On your next visit with her primary care physician please Get Medicines reviewed and adjusted.  Please request your Prim.MD to go over all Hospital Tests and Procedure/Radiological results at the follow up, please get all Hospital records sent to your Prim MD by signing hospital release before you go home.   If you experience worsening of your admission symptoms, develop shortness of breath, life threatening emergency, suicidal or homicidal thoughts you must seek medical attention immediately by calling 911 or calling your MD immediately  if symptoms less severe.  You Must read complete instructions/literature along with all the possible adverse reactions/side effects for all the Medicines you take and that have been prescribed to you. Take any new Medicines after you have completely understood and accpet all the possible adverse reactions/side effects.   Do not drive and provide baby sitting services if your were admitted for syncope or siezures until you have seen by Primary MD or a Neurologist and advised to do so again.  Do not drive when taking Pain medications.    Do not take more than prescribed Pain, Sleep and Anxiety Medications  Special Instructions: If you have smoked or chewed Tobacco  in the last 2 yrs please stop smoking, stop any regular Alcohol  and or any Recreational drug use.  Wear Seat belts while driving.   Please note  You were cared for by a hospitalist during your hospital stay. If you have any questions about your discharge medications or the care you received while you were in the hospital after you are discharged, you can call the unit and asked to speak with the hospitalist on call if the hospitalist that took care of you is not available. Once  you are discharged, your primary care physician will handle any further  medical issues. Please note that NO REFILLS for any discharge medications will be authorized once you are discharged, as it is imperative that you return to your primary care physician (or establish a relationship with a primary care physician if you do not have one) for your aftercare needs so that they can reassess your need for medications and monitor your lab values.     Increase activity slowly    Complete by:  As directed      Weight bearing as tolerated    Complete by:  As directed   Laterality:  left  Extremity:  Lower  In cam boot.            Medication List         ceFAZolin 1-5 GM-%  Commonly known as:  ANCEF  Inject 50 mLs (1 g total) into the vein every 8 (eight) hours. Provide 5 week supply     COLACE PO  Take 3 capsules by mouth at bedtime.     DULoxetine 60 MG capsule  Commonly known as:  CYMBALTA  Take 60 mg by mouth 2 (two) times daily.     esomeprazole 40 MG capsule  Commonly known as:  NEXIUM  Take 40 mg by mouth at bedtime.     HYDROcodone-acetaminophen 5-500 MG per tablet  Commonly known as:  VICODIN  Take 2 tablets by mouth at bedtime. For pain     insulin aspart 100 UNIT/ML injection  Commonly known as:  novoLOG  - Before each meal 3 times a day, 140-199 - 2 units, 200-250 - 4 units, 251-299 - 6 units,  300-349 - 8 units,  350 or above 10 units.  - Insulin PEN if approved, provide syringes and needles if needed.     insulin glargine 100 UNIT/ML injection  Commonly known as:  LANTUS  Inject 0.24 mLs (24 Units total) into the skin daily. Provide pen if qualifies or insulin needles and syringes- 1 month supply     levothyroxine 100 MCG tablet  Commonly known as:  SYNTHROID, LEVOTHROID  Take 100 mcg by mouth daily before breakfast.     lisinopril-hydrochlorothiazide 20-25 MG per tablet  Commonly known as:  PRINZIDE,ZESTORETIC  Take 1 tablet by mouth daily.     multivitamin with minerals Tabs tablet  Take 1 tablet by mouth daily.      polyethylene glycol packet  Commonly known as:  MIRALAX / GLYCOLAX  Take 17 g by mouth 2 (two) times daily.     pregabalin 150 MG capsule  Commonly known as:  LYRICA  Take 150 mg by mouth 2 (two) times daily.          Diet and Activity recommendation: See Discharge Instructions above   Consults obtained - orthopedics, ID   Major procedures and Radiology Reports - PLEASE review detailed and final reports for all details, in brief -    04/26/2014 by Dr. Victorino Dike   1. Removal of deep implants from talonavicular joint medially  2. Irrigation and debridement of left foot abscesses in multiple locations  3. Removal of deep implants from calcaneocuboid joint laterally (separate incision)  4. Left ankle arthrotomy with irrigation and debridement     Dg Ankle Complete Left  04/25/2014   CLINICAL DATA:  Wound infection  EXAM: LEFT ANKLE COMPLETE - 3+ VIEW  COMPARISON:  None.  FINDINGS: Frontal, oblique, and lateral views were obtained. Two screws transfix with the  hindfoot, including the anterior subtalar joint from a medial approach. Both screws have fractures in their respective mid portions.  There is marked soft tissue swelling throughout the ankle joint with foci of soft tissue air medially concerning for abscess. No acute fracture is seen. The ankle mortise appears intact. There is fluid in the joint. 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 There also surgical screws transfixing the calcaneocuboid joint region.  IMPRESSION: Marked soft tissue swelling with air medially. Suspect abscess within the soft tissues surrounding the ankle and hindfoot. There is postoperative change with 2 screws transfixing the anterior subtalar joint. There are fractures in the mid portions of each screw. There are also screws to the calcaneocuboid joint. The screws do not appear fractured. No acute fracture. Joint effusion which could potentially represent infection within the joint.  Given these findings,  it may be prudent to consider contrast enhanced CT to further evaluate.  Critical Value/emergent results were called by telephone at the time of interpretation on 04/25/2014 at 1:33 PM to Dr. Francine Graven , who verbally acknowledged these results.   Electronically Signed   By: Lowella Grip M.D.   On: 04/25/2014 13:34   Mr Ankle Left W Wo Contrast  04/25/2014   CLINICAL DATA:  Left foot pain and swelling. History of prior surgery.  EXAM: MRI OF THE LEFT ANKLE WITHOUT AND WITH CONTRAST  TECHNIQUE: Multiplanar, multisequence MR imaging of the ankle was performed before and after the administration of intravenous contrast.  CONTRAST:  1 MULTIHANCE GADOBENATE DIMEGLUMINE 529 MG/ML IV SOLN  COMPARISON:  Radiographs 04/25/2014  FINDINGS: Examination is limited by patient motion and artifact from hardware.  Hardware is noted in the midfoot with fractured screws noted on the radiographs. There is diffuse soft tissue swelling/edema and multiple rim enhancing subcutaneous fluid collections suspicious for abscesses. The largest cyst along the lateral malleolus. There is also a large joint effusion involving the tibiotalar joint with thick rim enhancing synovium. Could not exclude septic arthritis. Recommend joint aspiration. It is difficult to evaluate for osteomyelitis because of the significant artifact. Severe talonavicular joint degenerative changes are noted with moderate subluxation. Rim enhancing joint effusion is noted here also.  IMPRESSION: IMPRESSION Medial and lateral soft tissue abscesses.  Joint effusions and marked synovial enhancement worrisome for septic arthritis. This mainly involves the tibiotalar joint and talonavicular joint.  Limited examination for osteomyelitis due to artifact and patient motion.   Electronically Signed   By: Kalman Jewels M.D.   On: 04/25/2014 16:36   Dg Chest Port 1 View  04/26/2014   CLINICAL DATA:  Cough  EXAM: PORTABLE CHEST - 1 VIEW  COMPARISON:  Chest radiograph  02/23/2012  FINDINGS: Lung volumes are low and the patient is rotated to the right. Heart, mediastinal, and hilar contours appear within normal limits for portable technique and low lung volumes. There is suggestion of bilateral peribronchial thickening. No airspace consolidation, edema, or pleural effusion identified. Negative for pneumothorax. Bones unremarkable.  IMPRESSION: Low lung volumes. Question bilateral peribronchial thickening. This can be seen in the setting of acute or chronic bronchitis, smoking, or asthma.   Electronically Signed   By: Curlene Dolphin M.D.   On: 04/26/2014 09:25   Dg Fluoro Guide Ndl Plc/bx  04/25/2014   CLINICAL DATA:  5-YEAR-OLD FEMALE WITH SUSPECTED SEPTIC LEFT ANKLE.  EXAM: LEFT ANKLE JOINT ASPIRATION  TECHNIQUE: Written informed consent was obtained from the patient following explanation of the risks, benefits and alternatives. The ankle was prepped and draped in  the usual fashion using Betadine skin prep. Localization was performed under fluoroscopy. A 20 gauge spinal needle was advanced into the tibiotalar joint and a small volume of turbid bloody fluid was aspirated. An 18 gauge needle was also advanced into an area of fluctuance overlying the lateral malleolus. Approximately 1 cc of purulent fluid was successfully aspirated.  Hemostasis was attained by gentle manual pressure and Band-Aids were applied. The patient tolerated the procedure well. There was no immediate complication.  FLUOROSCOPY TIME:  37 seconds  COMPARISON:  Ankle MRI 04/25/2014  FINDINGS: Successful aspiration of a small amount of purulent/bloody fluid from the tibiotalar joint and fluid overlying the lateral malleolus.  IMPRESSION: Successful aspiration of a small amount of purulent/bloody fluid from the tibiotalar joint and fluid overlying the lateral malleolus.  Fluid sent for Gram stain and culture.   Electronically Signed   By: Jacqulynn Cadet M.D.   On: 04/25/2014 21:26    Micro Results      Recent Results (from the past 240 hour(s))  URINE CULTURE     Status: None   Collection Time    04/25/14 12:55 PM      Result Value Ref Range Status   Specimen Description URINE, CLEAN CATCH   Final   Special Requests NONE   Final   Culture  Setup Time     Final   Value: 04/25/2014 19:34     Performed at [facility]     Final   Value: >=100,000 COLONIES/ML     Performed at [facility]   Culture     Final   Value: KLEBSIELLA PNEUMONIAE     Performed at [facility]   Report Status 04/28/2014 FINAL   Final   Organism ID, Bacteria KLEBSIELLA PNEUMONIAE   Final  CULTURE, BLOOD (ROUTINE X 2)     Status: None   Collection Time    04/25/14  4:22 PM      Result Value Ref Range Status   Specimen Description BLOOD RIGHT ARM   Final   Special Requests BOTTLES DRAWN AEROBIC AND ANAEROBIC 10CC   Final   Culture  Setup Time     Final   Value: 04/25/2014 20:01     Performed at [facility]   Culture     Final   Value: NO GROWTH 5 DAYS     Performed at [facility]   Report Status 05/01/2014 FINAL   Final  CULTURE, BLOOD (ROUTINE X 2)     Status: None   Collection Time    04/25/14  4:39 PM      Result Value Ref Range Status   Specimen Description BLOOD ARM LEFT   Final   Special Requests BOTTLES DRAWN AEROBIC AND ANAEROBIC 5CC   Final   Culture  Setup Time     Final   Value: 04/25/2014 23:20     Performed at [facility]   Culture     Final   Value: NO GROWTH 5 DAYS     Performed at [facility]   Report Status 05/01/2014 FINAL   Final  BODY FLUID CULTURE     Status: None   Collection Time    04/25/14  8:03 PM      Result Value Ref Range Status   Specimen Description SYNOVIAL FLUID ANKLE LEFT   Final   Special Requests Normal   Final   Gram Stain     Final  Value: FEW WBC PRESENT,BOTH PMN AND MONONUCLEAR     NO ORGANISMS SEEN     Performed at [facility]   Culture     Final   Value: NO  GROWTH 3 DAYS     Performed at [facility]   Report Status 04/29/2014 FINAL   Final  URINE CULTURE     Status: None   Collection Time    04/25/14  8:38 PM      Result Value Ref Range Status   Specimen Description URINE, RANDOM   Final   Special Requests NONE   Final   Culture  Setup Time     Final   Value: 04/26/2014 03:25     Performed at [facility] Count     Final   Value: NO GROWTH     Performed at [facility]   Culture     Final   Value: NO GROWTH     Performed at [facility]   Report Status 04/26/2014 FINAL   Final  SURGICAL PCR SCREEN     Status: None   Collection Time    04/26/14  5:51 AM      Result Value Ref Range Status   MRSA, PCR NEGATIVE  NEGATIVE Final   Staphylococcus aureus NEGATIVE  NEGATIVE Final   Comment:            The Xpert SA Assay (FDA     approved for NASAL specimens     in patients over 59 years of age),     is one component of     a comprehensive surveillance     program.  Test performance has     been validated by [facility] for patients greater     than or equal to 55 year old.     It is not intended     to diagnose infection nor to     guide or monitor treatment.  ANAEROBIC CULTURE     Status: None   Collection Time    04/26/14  2:03 PM      Result Value Ref Range Status   Specimen Description WOUND ANKLE LEFT   Final   Special Requests PATIENT ON FOLLOWING VANCOMYCIN   Final   Gram Stain     Final   Value: NO WBC SEEN     NO ORGANISMS SEEN     Performed at [facility]   Culture     Final   Value: NO ANAEROBES ISOLATED     Performed at [facility]   Report Status 05/01/2014 FINAL   Final  TISSUE CULTURE     Status: None   Collection Time    04/26/14  2:03 PM      Result Value Ref Range Status   Specimen Description TISSUE LEFT ANKLE   Final   Special Requests PATIENT ON FOLLOWING VANCOMYCIN   Final   Gram Stain     Final   Value: NO WBC SEEN     NO ORGANISMS  SEEN     Performed at [facility]   Culture     Final   Value: FEW STAPHYLOCOCCUS SPECIES (COAGULASE NEGATIVE)     Note: RIFAMPIN AND GENTAMICIN SHOULD NOT BE USED AS SINGLE DRUGS FOR TREATMENT OF STAPH INFECTIONS.     Performed at [facility]   Report Status 05/01/2014 FINAL   Final   Organism ID, Bacteria  STAPHYLOCOCCUS SPECIES (COAGULASE NEGATIVE)   Final     History of present illness and  Hospital Course:     Kindly see H&P for history of present illness and admission details, please review complete Labs, Consult reports and Test reports for all details in brief Kathryn Ryan, is a 73 y.o. female, patient with history of  spinal stenosis and hypertension, had history of open reduction internal fixation of left ankle done in [place], [place]. Last November. Patient said this is done for insufficient ligaments rather than fracture. Patient reported problems with her left ankle since surgery, she is having pain especially with weightbearing, for the past several weeks she noticed some swelling. Since about a week ago she started to have redness and severe tenderness. She went to see Dr. Doran Durand as outpatient, she was scheduled today at 04/25/2014 for outpatient surgery for hardware removal, when she was evaluated by the anesthesiologist thought she needed to be sent to the hospital for IV antibiotics. She was seen by orthopedic surgery and ID, underwent operative procedure as below along with 6 weeks of antibiotics. Of note she has a new diagnosis of type 2 diabetes mellitus requiring insulin the   Infected hardware with abscess in the left ankle   Was seen by Ortho and ID, post Removal of deep implants from talonavicular joint medially, Irrigation and debridement of left foot abscesses in multiple locations, Removal of deep implants from calcaneocuboid joint laterally (separate incision) , Left ankle arthrotomy with irrigation and debridement by orthopedic surgeon Dr. Wylene Simmer  on 04/26/2014, which is noted as coag-negative staph has been transitioned to 5 more weeks of IV cephalosporin as below. Will have home health nurse come and educate the patient for the same. We'll closely follow with orthopedics and ID post discharge.   She has a PICC line which needs to be removed once IV antibiotics are finished.    UTI?  Treated    New DM 2  Patient has no documented history of DM. Newly diagnosed type 2 diabetes mellitus this admission. On SSI , added Lantus for better control will increase to 24 U , she will receive diabetic education and insulin education prior to discharge will get insulin supplies and glucometer.    Lab Results   Component  Value  Date    HGBA1C  7.0*  04/25/2014     CBG (last 3)   Recent Labs  05/01/14 1649 05/01/14 2103 05/02/14 0757  GLUCAP 156* 183* 139*      Hypothyroid  Will continue Synthroid     HTN  Resume home dose HCTZ and lisinopril    Neuropathy  Has a history of claudication with 2 spinal surgeries  Continue Lyrica  .    GERD  Continue PPI    Low K - replaced & stable      Today   Subjective:   Kathryn Ryan today has no headache,no chest abdominal pain,no new weakness tingling or numbness, feels much better wants to go home today.    Objective:   Blood pressure 152/77, pulse 96, temperature 98.2 F (36.8 C), temperature source Oral, resp. rate 18, height 5\' 6"  (1.676 m), weight 106.595 kg (235 lb), SpO2 97.00%.   Intake/Output Summary (Last 24 hours) at 05/02/14 7989 Last data filed at 05/01/14 1335  Gross per 24 hour  Intake    960 ml  Output      0 ml  Net    960 ml    Exam Awake Alert,  Oriented x 3, No new F.N deficits, Normal affect Crane.AT,PERRAL Supple Neck,No JVD, No cervical lymphadenopathy appriciated.  Symmetrical Chest wall movement, Good air movement bilaterally, CTAB RRR,No Gallops,Rubs or new Murmurs, No Parasternal Heave +ve B.Sounds, Abd Soft, Non tender, No  organomegaly appriciated, No rebound -guarding or rigidity. No Cyanosis, Clubbing or edema, No new Rash or bruise Left ankle has a Cam walker boot, drains are out, good range of motion and sensation in toes R.Arm PICC   Data Review   CBC w Diff: Lab Results  Component Value Date   WBC 7.7 04/28/2014   HGB 10.3* 04/28/2014   HCT 32.2* 04/28/2014   PLT 378 04/28/2014   LYMPHOPCT 12 04/25/2014   MONOPCT 8 04/25/2014   EOSPCT 0 04/25/2014   BASOPCT 0 04/25/2014    CMP: Lab Results  Component Value Date   NA 142 04/28/2014   K 3.9 04/30/2014   CL 99 04/28/2014   CO2 31 04/28/2014   BUN 14 04/28/2014   CREATININE 0.88 04/28/2014   PROT 7.0 04/25/2014   ALBUMIN 2.4* 04/25/2014   BILITOT 0.3 04/25/2014   ALKPHOS 142* 04/25/2014   AST 15 04/25/2014   ALT 12 04/25/2014  .   Total Time in preparing paper work, data evaluation and todays exam - 35 minutes  Thurnell Lose M.D on 05/02/2014 at 7:12 AM  [facility] Office  [phone]   **Disclaimer: This note may have been dictated with voice recognition software. Similar sounding words can inadvertently be transcribed and this note may contain transcription errors which may not have been corrected upon publication of note.**

## 2014-05-02 NOTE — Progress Notes (Signed)
Inpatient Diabetes Program Recommendations  AACE/ADA: New Consensus Statement on Inpatient Glycemic Control (2013)  Target Ranges:  Prepandial:   less than 140 mg/dL      Peak postprandial:   less than 180 mg/dL (1-2 hours)      Critically ill patients:  140 - 180 mg/dL   Reason for Visit: New diagnosis of diabetes.  She states that she was told in the past by her PCP that she had diabetes and he had ordered a glucose meter for her, however she did not get it because her insurance was not paying on it properly?  She states that she has handled that now and she can now pick it up from the drug store.   Discussed A1C results, monitoring, and the actions of the insulins that she will be taking including Lantus (long acting) and Novolog (fast acting).  Also showed her how to use an insulin pen just in case it is ordered. She seemed overwhelmed. Attempted to give emotional support.  She will have home health which will be very useful with diabetes teaching.  Also reviewed hypoglycemia signs and symptoms and treatment.  Patient was able to teach back proper treatment of hypoglycemia stating "I will drink juice 1/2 cup".  Discussed that insulin could be temporary based on on A1C results (7.0%) and the importance of glycemic control for healing of her foot.  Encouraged her to follow-up with PCP asap.  Called and discussed with Dr. Candiss Norse.  Patient does live alone.  He states that for safety purposes, if CBG less than 120 mg/dL, she should hold insulin and call her PCP.  Discussed with RN.  Also will give patient AADE DVD regarding diabetes management.    Adah Perl, RN, BC-ADM Inpatient Diabetes Coordinator Pager [phone]

## 2014-05-02 NOTE — Progress Notes (Signed)
Subjective: 6 Days Post-Op Procedure(s) (LRB): HARDWARE REMOVAL LEFT ANKLE (Left) IRRIGATION AND DEBRIDEMENT LEFT ANKLE (Left) Patient reports pain as mild.  No c/o.  Hoping for d/c home today.  Objective: Vital signs in last 24 hours: Temp:  [98 F (36.7 C)-98.3 F (36.8 C)] 98.2 F (36.8 C) (05/26 0520) Pulse Rate:  [88-96] 96 (05/26 0520) Resp:  [18] 18 (05/26 0520) BP: (128-152)/(60-77) 152/77 mmHg (05/26 0520) SpO2:  [97 %-98 %] 97 % (05/26 0520)  Intake/Output from previous day: 05/25 0701 - 05/26 0700 In: 960 [P.O.:960] Out: -  Intake/Output this shift:    No results found for this basename: HGB,  in the last 72 hours No results found for this basename: WBC, RBC, HCT, PLT,  in the last 72 hours  Recent Labs  04/30/14 0545  K 3.9   No results found for this basename: LABPT, INR,  in the last 72 hours  R LE in cam boot.  Assessment/Plan: 6 Days Post-Op Procedure(s) (LRB): HARDWARE REMOVAL LEFT ANKLE (Left) IRRIGATION AND DEBRIDEMENT LEFT ANKLE (Left) Discharge home with home health for Abx.  F/u with me in clinic in 10-14 days.  WBAT in cam boot.  Dressing change daily and as needed.  Wylene Simmer 05/02/2014, 6:54 AM

## 2014-05-03 DIAGNOSIS — L03119 Cellulitis of unspecified part of limb: Secondary | ICD-10-CM | POA: Diagnosis not present

## 2014-05-03 DIAGNOSIS — L02419 Cutaneous abscess of limb, unspecified: Secondary | ICD-10-CM | POA: Diagnosis not present

## 2014-05-03 DIAGNOSIS — Z4789 Encounter for other orthopedic aftercare: Secondary | ICD-10-CM | POA: Diagnosis not present

## 2014-05-03 DIAGNOSIS — M009 Pyogenic arthritis, unspecified: Secondary | ICD-10-CM | POA: Diagnosis not present

## 2014-05-03 DIAGNOSIS — L02619 Cutaneous abscess of unspecified foot: Secondary | ICD-10-CM | POA: Diagnosis not present

## 2014-05-03 DIAGNOSIS — Z452 Encounter for adjustment and management of vascular access device: Secondary | ICD-10-CM | POA: Diagnosis not present

## 2014-05-03 DIAGNOSIS — IMO0001 Reserved for inherently not codable concepts without codable children: Secondary | ICD-10-CM | POA: Diagnosis not present

## 2014-05-04 DIAGNOSIS — IMO0001 Reserved for inherently not codable concepts without codable children: Secondary | ICD-10-CM | POA: Diagnosis not present

## 2014-05-04 DIAGNOSIS — M009 Pyogenic arthritis, unspecified: Secondary | ICD-10-CM | POA: Diagnosis not present

## 2014-05-04 DIAGNOSIS — Z4789 Encounter for other orthopedic aftercare: Secondary | ICD-10-CM | POA: Diagnosis not present

## 2014-05-04 DIAGNOSIS — L02419 Cutaneous abscess of limb, unspecified: Secondary | ICD-10-CM | POA: Diagnosis not present

## 2014-05-04 DIAGNOSIS — L02619 Cutaneous abscess of unspecified foot: Secondary | ICD-10-CM | POA: Diagnosis not present

## 2014-05-04 DIAGNOSIS — Z452 Encounter for adjustment and management of vascular access device: Secondary | ICD-10-CM | POA: Diagnosis not present

## 2014-05-05 DIAGNOSIS — Z4789 Encounter for other orthopedic aftercare: Secondary | ICD-10-CM | POA: Diagnosis not present

## 2014-05-05 DIAGNOSIS — Z452 Encounter for adjustment and management of vascular access device: Secondary | ICD-10-CM | POA: Diagnosis not present

## 2014-05-05 DIAGNOSIS — IMO0001 Reserved for inherently not codable concepts without codable children: Secondary | ICD-10-CM | POA: Diagnosis not present

## 2014-05-05 DIAGNOSIS — M009 Pyogenic arthritis, unspecified: Secondary | ICD-10-CM | POA: Diagnosis not present

## 2014-05-05 DIAGNOSIS — L02419 Cutaneous abscess of limb, unspecified: Secondary | ICD-10-CM | POA: Diagnosis not present

## 2014-05-05 DIAGNOSIS — L03119 Cellulitis of unspecified part of limb: Secondary | ICD-10-CM | POA: Diagnosis not present

## 2014-05-05 DIAGNOSIS — L02619 Cutaneous abscess of unspecified foot: Secondary | ICD-10-CM | POA: Diagnosis not present

## 2014-05-08 DIAGNOSIS — L02619 Cutaneous abscess of unspecified foot: Secondary | ICD-10-CM | POA: Diagnosis not present

## 2014-05-08 DIAGNOSIS — Z452 Encounter for adjustment and management of vascular access device: Secondary | ICD-10-CM | POA: Diagnosis not present

## 2014-05-08 DIAGNOSIS — Z4789 Encounter for other orthopedic aftercare: Secondary | ICD-10-CM | POA: Diagnosis not present

## 2014-05-08 DIAGNOSIS — L02419 Cutaneous abscess of limb, unspecified: Secondary | ICD-10-CM | POA: Diagnosis not present

## 2014-05-08 DIAGNOSIS — L03119 Cellulitis of unspecified part of limb: Secondary | ICD-10-CM | POA: Diagnosis not present

## 2014-05-08 DIAGNOSIS — M869 Osteomyelitis, unspecified: Secondary | ICD-10-CM | POA: Diagnosis not present

## 2014-05-08 DIAGNOSIS — IMO0001 Reserved for inherently not codable concepts without codable children: Secondary | ICD-10-CM | POA: Diagnosis not present

## 2014-05-08 DIAGNOSIS — M009 Pyogenic arthritis, unspecified: Secondary | ICD-10-CM | POA: Diagnosis not present

## 2014-05-09 DIAGNOSIS — L02419 Cutaneous abscess of limb, unspecified: Secondary | ICD-10-CM | POA: Diagnosis not present

## 2014-05-09 DIAGNOSIS — L02619 Cutaneous abscess of unspecified foot: Secondary | ICD-10-CM | POA: Diagnosis not present

## 2014-05-09 DIAGNOSIS — IMO0001 Reserved for inherently not codable concepts without codable children: Secondary | ICD-10-CM | POA: Diagnosis not present

## 2014-05-09 DIAGNOSIS — M009 Pyogenic arthritis, unspecified: Secondary | ICD-10-CM | POA: Diagnosis not present

## 2014-05-09 DIAGNOSIS — Z4789 Encounter for other orthopedic aftercare: Secondary | ICD-10-CM | POA: Diagnosis not present

## 2014-05-09 DIAGNOSIS — Z452 Encounter for adjustment and management of vascular access device: Secondary | ICD-10-CM | POA: Diagnosis not present

## 2014-05-10 ENCOUNTER — Ambulatory Visit: Payer: Medicare Other

## 2014-05-10 DIAGNOSIS — Z452 Encounter for adjustment and management of vascular access device: Secondary | ICD-10-CM | POA: Diagnosis not present

## 2014-05-10 DIAGNOSIS — L02619 Cutaneous abscess of unspecified foot: Secondary | ICD-10-CM | POA: Diagnosis not present

## 2014-05-10 DIAGNOSIS — M009 Pyogenic arthritis, unspecified: Secondary | ICD-10-CM | POA: Diagnosis not present

## 2014-05-10 DIAGNOSIS — Z4789 Encounter for other orthopedic aftercare: Secondary | ICD-10-CM | POA: Diagnosis not present

## 2014-05-10 DIAGNOSIS — IMO0001 Reserved for inherently not codable concepts without codable children: Secondary | ICD-10-CM | POA: Diagnosis not present

## 2014-05-10 DIAGNOSIS — L02419 Cutaneous abscess of limb, unspecified: Secondary | ICD-10-CM | POA: Diagnosis not present

## 2014-05-11 DIAGNOSIS — Z4789 Encounter for other orthopedic aftercare: Secondary | ICD-10-CM | POA: Diagnosis not present

## 2014-05-11 DIAGNOSIS — M009 Pyogenic arthritis, unspecified: Secondary | ICD-10-CM | POA: Diagnosis not present

## 2014-05-11 DIAGNOSIS — L02619 Cutaneous abscess of unspecified foot: Secondary | ICD-10-CM | POA: Diagnosis not present

## 2014-05-11 DIAGNOSIS — Z452 Encounter for adjustment and management of vascular access device: Secondary | ICD-10-CM | POA: Diagnosis not present

## 2014-05-11 DIAGNOSIS — L02419 Cutaneous abscess of limb, unspecified: Secondary | ICD-10-CM | POA: Diagnosis not present

## 2014-05-11 DIAGNOSIS — IMO0001 Reserved for inherently not codable concepts without codable children: Secondary | ICD-10-CM | POA: Diagnosis not present

## 2014-05-12 DIAGNOSIS — R269 Unspecified abnormalities of gait and mobility: Secondary | ICD-10-CM | POA: Diagnosis not present

## 2014-05-12 DIAGNOSIS — M19079 Primary osteoarthritis, unspecified ankle and foot: Secondary | ICD-10-CM | POA: Diagnosis not present

## 2014-05-16 DIAGNOSIS — L02619 Cutaneous abscess of unspecified foot: Secondary | ICD-10-CM | POA: Diagnosis not present

## 2014-05-16 DIAGNOSIS — M009 Pyogenic arthritis, unspecified: Secondary | ICD-10-CM | POA: Diagnosis not present

## 2014-05-16 DIAGNOSIS — IMO0001 Reserved for inherently not codable concepts without codable children: Secondary | ICD-10-CM | POA: Diagnosis not present

## 2014-05-16 DIAGNOSIS — L02419 Cutaneous abscess of limb, unspecified: Secondary | ICD-10-CM | POA: Diagnosis not present

## 2014-05-16 DIAGNOSIS — Z452 Encounter for adjustment and management of vascular access device: Secondary | ICD-10-CM | POA: Diagnosis not present

## 2014-05-16 DIAGNOSIS — Z4789 Encounter for other orthopedic aftercare: Secondary | ICD-10-CM | POA: Diagnosis not present

## 2014-05-16 DIAGNOSIS — L03119 Cellulitis of unspecified part of limb: Secondary | ICD-10-CM | POA: Diagnosis not present

## 2014-05-17 ENCOUNTER — Ambulatory Visit: Payer: Medicare Other

## 2014-05-17 DIAGNOSIS — L03119 Cellulitis of unspecified part of limb: Secondary | ICD-10-CM | POA: Diagnosis not present

## 2014-05-17 DIAGNOSIS — L02619 Cutaneous abscess of unspecified foot: Secondary | ICD-10-CM | POA: Diagnosis not present

## 2014-05-17 DIAGNOSIS — IMO0001 Reserved for inherently not codable concepts without codable children: Secondary | ICD-10-CM | POA: Diagnosis not present

## 2014-05-17 DIAGNOSIS — Z452 Encounter for adjustment and management of vascular access device: Secondary | ICD-10-CM | POA: Diagnosis not present

## 2014-05-17 DIAGNOSIS — L02419 Cutaneous abscess of limb, unspecified: Secondary | ICD-10-CM | POA: Diagnosis not present

## 2014-05-17 DIAGNOSIS — M009 Pyogenic arthritis, unspecified: Secondary | ICD-10-CM | POA: Diagnosis not present

## 2014-05-17 DIAGNOSIS — Z4789 Encounter for other orthopedic aftercare: Secondary | ICD-10-CM | POA: Diagnosis not present

## 2014-05-19 DIAGNOSIS — Z452 Encounter for adjustment and management of vascular access device: Secondary | ICD-10-CM | POA: Diagnosis not present

## 2014-05-19 DIAGNOSIS — IMO0001 Reserved for inherently not codable concepts without codable children: Secondary | ICD-10-CM | POA: Diagnosis not present

## 2014-05-19 DIAGNOSIS — L02419 Cutaneous abscess of limb, unspecified: Secondary | ICD-10-CM | POA: Diagnosis not present

## 2014-05-19 DIAGNOSIS — L03119 Cellulitis of unspecified part of limb: Secondary | ICD-10-CM | POA: Diagnosis not present

## 2014-05-19 DIAGNOSIS — Z4789 Encounter for other orthopedic aftercare: Secondary | ICD-10-CM | POA: Diagnosis not present

## 2014-05-19 DIAGNOSIS — L02619 Cutaneous abscess of unspecified foot: Secondary | ICD-10-CM | POA: Diagnosis not present

## 2014-05-19 DIAGNOSIS — M009 Pyogenic arthritis, unspecified: Secondary | ICD-10-CM | POA: Diagnosis not present

## 2014-05-22 ENCOUNTER — Telehealth: Payer: Self-pay | Admitting: *Deleted

## 2014-05-22 ENCOUNTER — Ambulatory Visit (INDEPENDENT_AMBULATORY_CARE_PROVIDER_SITE_OTHER): Payer: Medicare Other | Admitting: Infectious Diseases

## 2014-05-22 ENCOUNTER — Encounter: Payer: Self-pay | Admitting: Infectious Diseases

## 2014-05-22 VITALS — BP 129/78 | HR 118 | Temp 97.8°F | Ht 67.0 in | Wt 238.0 lb

## 2014-05-22 DIAGNOSIS — M009 Pyogenic arthritis, unspecified: Secondary | ICD-10-CM | POA: Diagnosis not present

## 2014-05-22 DIAGNOSIS — L02619 Cutaneous abscess of unspecified foot: Secondary | ICD-10-CM | POA: Diagnosis not present

## 2014-05-22 DIAGNOSIS — M8610 Other acute osteomyelitis, unspecified site: Secondary | ICD-10-CM

## 2014-05-22 DIAGNOSIS — IMO0001 Reserved for inherently not codable concepts without codable children: Secondary | ICD-10-CM | POA: Diagnosis not present

## 2014-05-22 DIAGNOSIS — Z4789 Encounter for other orthopedic aftercare: Secondary | ICD-10-CM | POA: Diagnosis not present

## 2014-05-22 DIAGNOSIS — Z452 Encounter for adjustment and management of vascular access device: Secondary | ICD-10-CM | POA: Diagnosis not present

## 2014-05-22 DIAGNOSIS — L02419 Cutaneous abscess of limb, unspecified: Secondary | ICD-10-CM | POA: Diagnosis not present

## 2014-05-22 NOTE — Telephone Encounter (Signed)
Verbal order per Dr. Johnnye Sima given to Jackelyn Poling at [facility] to repeat patient's BMP today due to elevated potassium. Myrtis Hopping

## 2014-05-22 NOTE — Progress Notes (Signed)
   Subjective:    Patient ID: Kathryn Ryan, female    DOB: 07-15-1941, 73 y.o.   MRN: [id number]  HPI 73 yo F with hx of tendon tear in L ankle 10-19-13 repaired with screws in [place]. She had a plastic brace fitted for her ankle that she has worn since then. Due to her neuropathy, this has irritated the skin on her ankle. She underwent I & D and removal of her screws on 5-21. He operative Cx grew MSSE and she was d/c home on 5-26 with plan for 6 weeks of ancef (end date 06-05-14). She is curious as to when she can have repeat fixation of her ankle.  No problems with PIC- no erythema, no d/c, no pain.  Has had some mild d/c from the lateral wound on her L ankle.  No rashes, loose BM, eating well.   Review of Systems  Constitutional: Negative for fever and chills.  Genitourinary: Negative for difficulty urinating.       Objective:   Physical Exam  Constitutional: She appears well-developed and well-nourished.  HENT:  Mouth/Throat: No oropharyngeal exudate.  Eyes: EOM are normal. Pupils are equal, round, and reactive to light.  Cardiovascular: Normal rate.   Pulmonary/Chest: Effort normal and breath sounds normal.  Abdominal: Soft. Bowel sounds are normal. There is no tenderness.  Musculoskeletal:  L ankle is swollen, minimal edema, non-tender, no erythema. Wound have no d/c.           Assessment & Plan:

## 2014-05-22 NOTE — Assessment & Plan Note (Signed)
She is doing well. Will pull her PIC on 6-29 and stop her anbx that day as well provided her ESR and CRP are normal. Will see her back prn. She has f/u appt with Dr Merlinda Frederick in the intervening period.

## 2014-05-22 NOTE — Telephone Encounter (Signed)
Verbal order given to Debbie at [facility] per Dr. Johnnye Sima to pull patient's picc line on 06/05/14. Kathryn Ryan

## 2014-05-24 ENCOUNTER — Ambulatory Visit: Payer: Medicare Other

## 2014-05-24 DIAGNOSIS — IMO0001 Reserved for inherently not codable concepts without codable children: Secondary | ICD-10-CM | POA: Diagnosis not present

## 2014-05-24 DIAGNOSIS — L02419 Cutaneous abscess of limb, unspecified: Secondary | ICD-10-CM | POA: Diagnosis not present

## 2014-05-24 DIAGNOSIS — Z4789 Encounter for other orthopedic aftercare: Secondary | ICD-10-CM | POA: Diagnosis not present

## 2014-05-24 DIAGNOSIS — M009 Pyogenic arthritis, unspecified: Secondary | ICD-10-CM | POA: Diagnosis not present

## 2014-05-24 DIAGNOSIS — L02619 Cutaneous abscess of unspecified foot: Secondary | ICD-10-CM | POA: Diagnosis not present

## 2014-05-24 DIAGNOSIS — Z452 Encounter for adjustment and management of vascular access device: Secondary | ICD-10-CM | POA: Diagnosis not present

## 2014-05-26 DIAGNOSIS — L02619 Cutaneous abscess of unspecified foot: Secondary | ICD-10-CM | POA: Diagnosis not present

## 2014-05-26 DIAGNOSIS — Z4789 Encounter for other orthopedic aftercare: Secondary | ICD-10-CM | POA: Diagnosis not present

## 2014-05-26 DIAGNOSIS — L03119 Cellulitis of unspecified part of limb: Secondary | ICD-10-CM | POA: Diagnosis not present

## 2014-05-26 DIAGNOSIS — Z452 Encounter for adjustment and management of vascular access device: Secondary | ICD-10-CM | POA: Diagnosis not present

## 2014-05-26 DIAGNOSIS — M009 Pyogenic arthritis, unspecified: Secondary | ICD-10-CM | POA: Diagnosis not present

## 2014-05-26 DIAGNOSIS — IMO0001 Reserved for inherently not codable concepts without codable children: Secondary | ICD-10-CM | POA: Diagnosis not present

## 2014-05-26 DIAGNOSIS — L02419 Cutaneous abscess of limb, unspecified: Secondary | ICD-10-CM | POA: Diagnosis not present

## 2014-05-29 DIAGNOSIS — IMO0001 Reserved for inherently not codable concepts without codable children: Secondary | ICD-10-CM | POA: Diagnosis not present

## 2014-05-29 DIAGNOSIS — L02419 Cutaneous abscess of limb, unspecified: Secondary | ICD-10-CM | POA: Diagnosis not present

## 2014-05-29 DIAGNOSIS — L02619 Cutaneous abscess of unspecified foot: Secondary | ICD-10-CM | POA: Diagnosis not present

## 2014-05-29 DIAGNOSIS — Z4789 Encounter for other orthopedic aftercare: Secondary | ICD-10-CM | POA: Diagnosis not present

## 2014-05-29 DIAGNOSIS — M009 Pyogenic arthritis, unspecified: Secondary | ICD-10-CM | POA: Diagnosis not present

## 2014-05-29 DIAGNOSIS — Z452 Encounter for adjustment and management of vascular access device: Secondary | ICD-10-CM | POA: Diagnosis not present

## 2014-05-30 ENCOUNTER — Telehealth: Payer: Self-pay | Admitting: *Deleted

## 2014-05-30 DIAGNOSIS — L02419 Cutaneous abscess of limb, unspecified: Secondary | ICD-10-CM | POA: Diagnosis not present

## 2014-05-30 DIAGNOSIS — Z452 Encounter for adjustment and management of vascular access device: Secondary | ICD-10-CM | POA: Diagnosis not present

## 2014-05-30 DIAGNOSIS — IMO0001 Reserved for inherently not codable concepts without codable children: Secondary | ICD-10-CM | POA: Diagnosis not present

## 2014-05-30 DIAGNOSIS — M009 Pyogenic arthritis, unspecified: Secondary | ICD-10-CM | POA: Diagnosis not present

## 2014-05-30 DIAGNOSIS — L03119 Cellulitis of unspecified part of limb: Secondary | ICD-10-CM | POA: Diagnosis not present

## 2014-05-30 DIAGNOSIS — Z4789 Encounter for other orthopedic aftercare: Secondary | ICD-10-CM | POA: Diagnosis not present

## 2014-05-30 DIAGNOSIS — L02619 Cutaneous abscess of unspecified foot: Secondary | ICD-10-CM | POA: Diagnosis not present

## 2014-05-30 NOTE — Telephone Encounter (Signed)
Please pull, thanks

## 2014-05-30 NOTE — Telephone Encounter (Signed)
Received request by patient to keep PICC after antibiotics are complete on 6/29.  Per Arbie Cookey, [facility] RN, pt states she is a difficult stick and would like to keep the IV access for blood draws through her surgery on 7/8.  As it is not our policy to do so, this RN left a message with the surgeon's office asking if they would like to take over the orders for the PICC from 6/29-7/8.  Hewitt Blade that it is unlikely that the patient would be allowed to keep the PICC.  Arbie Cookey verbalized understanding, will plan on pulling the PICC as ordered at the completion of abx therapy on 6/29 unless she hears otherwise.  Landis Gandy, RN

## 2014-06-01 DIAGNOSIS — G9050 Complex regional pain syndrome I, unspecified: Secondary | ICD-10-CM | POA: Diagnosis not present

## 2014-06-01 DIAGNOSIS — E039 Hypothyroidism, unspecified: Secondary | ICD-10-CM | POA: Diagnosis not present

## 2014-06-01 DIAGNOSIS — L02619 Cutaneous abscess of unspecified foot: Secondary | ICD-10-CM | POA: Diagnosis not present

## 2014-06-01 DIAGNOSIS — Z452 Encounter for adjustment and management of vascular access device: Secondary | ICD-10-CM | POA: Diagnosis not present

## 2014-06-01 DIAGNOSIS — E1149 Type 2 diabetes mellitus with other diabetic neurological complication: Secondary | ICD-10-CM | POA: Diagnosis not present

## 2014-06-01 DIAGNOSIS — I10 Essential (primary) hypertension: Secondary | ICD-10-CM | POA: Diagnosis not present

## 2014-06-01 DIAGNOSIS — M009 Pyogenic arthritis, unspecified: Secondary | ICD-10-CM | POA: Diagnosis not present

## 2014-06-01 DIAGNOSIS — L02419 Cutaneous abscess of limb, unspecified: Secondary | ICD-10-CM | POA: Diagnosis not present

## 2014-06-01 DIAGNOSIS — IMO0001 Reserved for inherently not codable concepts without codable children: Secondary | ICD-10-CM | POA: Diagnosis not present

## 2014-06-01 DIAGNOSIS — E782 Mixed hyperlipidemia: Secondary | ICD-10-CM | POA: Diagnosis not present

## 2014-06-01 DIAGNOSIS — E1142 Type 2 diabetes mellitus with diabetic polyneuropathy: Secondary | ICD-10-CM | POA: Diagnosis not present

## 2014-06-01 DIAGNOSIS — M86179 Other acute osteomyelitis, unspecified ankle and foot: Secondary | ICD-10-CM | POA: Diagnosis not present

## 2014-06-01 DIAGNOSIS — M545 Low back pain, unspecified: Secondary | ICD-10-CM | POA: Diagnosis not present

## 2014-06-01 DIAGNOSIS — L03119 Cellulitis of unspecified part of limb: Secondary | ICD-10-CM | POA: Diagnosis not present

## 2014-06-01 DIAGNOSIS — Z4789 Encounter for other orthopedic aftercare: Secondary | ICD-10-CM | POA: Diagnosis not present

## 2014-06-04 DIAGNOSIS — Z452 Encounter for adjustment and management of vascular access device: Secondary | ICD-10-CM | POA: Diagnosis not present

## 2014-06-04 DIAGNOSIS — IMO0001 Reserved for inherently not codable concepts without codable children: Secondary | ICD-10-CM | POA: Diagnosis not present

## 2014-06-04 DIAGNOSIS — L03119 Cellulitis of unspecified part of limb: Secondary | ICD-10-CM | POA: Diagnosis not present

## 2014-06-04 DIAGNOSIS — L02619 Cutaneous abscess of unspecified foot: Secondary | ICD-10-CM | POA: Diagnosis not present

## 2014-06-04 DIAGNOSIS — M009 Pyogenic arthritis, unspecified: Secondary | ICD-10-CM | POA: Diagnosis not present

## 2014-06-04 DIAGNOSIS — Z5181 Encounter for therapeutic drug level monitoring: Secondary | ICD-10-CM | POA: Diagnosis not present

## 2014-06-04 DIAGNOSIS — L02419 Cutaneous abscess of limb, unspecified: Secondary | ICD-10-CM | POA: Diagnosis not present

## 2014-06-04 DIAGNOSIS — Z4789 Encounter for other orthopedic aftercare: Secondary | ICD-10-CM | POA: Diagnosis not present

## 2014-06-05 DIAGNOSIS — L02619 Cutaneous abscess of unspecified foot: Secondary | ICD-10-CM | POA: Diagnosis not present

## 2014-06-05 DIAGNOSIS — L03119 Cellulitis of unspecified part of limb: Secondary | ICD-10-CM | POA: Diagnosis not present

## 2014-06-05 DIAGNOSIS — M009 Pyogenic arthritis, unspecified: Secondary | ICD-10-CM | POA: Diagnosis not present

## 2014-06-05 DIAGNOSIS — IMO0001 Reserved for inherently not codable concepts without codable children: Secondary | ICD-10-CM | POA: Diagnosis not present

## 2014-06-05 DIAGNOSIS — L02419 Cutaneous abscess of limb, unspecified: Secondary | ICD-10-CM | POA: Diagnosis not present

## 2014-06-05 DIAGNOSIS — Z452 Encounter for adjustment and management of vascular access device: Secondary | ICD-10-CM | POA: Diagnosis not present

## 2014-06-05 DIAGNOSIS — Z4789 Encounter for other orthopedic aftercare: Secondary | ICD-10-CM | POA: Diagnosis not present

## 2014-06-06 DIAGNOSIS — L02419 Cutaneous abscess of limb, unspecified: Secondary | ICD-10-CM | POA: Diagnosis not present

## 2014-06-06 DIAGNOSIS — L03119 Cellulitis of unspecified part of limb: Secondary | ICD-10-CM | POA: Diagnosis not present

## 2014-06-06 DIAGNOSIS — IMO0001 Reserved for inherently not codable concepts without codable children: Secondary | ICD-10-CM | POA: Diagnosis not present

## 2014-06-06 DIAGNOSIS — M009 Pyogenic arthritis, unspecified: Secondary | ICD-10-CM | POA: Diagnosis not present

## 2014-06-06 DIAGNOSIS — Z452 Encounter for adjustment and management of vascular access device: Secondary | ICD-10-CM | POA: Diagnosis not present

## 2014-06-06 DIAGNOSIS — Z4789 Encounter for other orthopedic aftercare: Secondary | ICD-10-CM | POA: Diagnosis not present

## 2014-06-06 DIAGNOSIS — L02619 Cutaneous abscess of unspecified foot: Secondary | ICD-10-CM | POA: Diagnosis not present

## 2014-06-08 DIAGNOSIS — Z452 Encounter for adjustment and management of vascular access device: Secondary | ICD-10-CM | POA: Diagnosis not present

## 2014-06-08 DIAGNOSIS — M009 Pyogenic arthritis, unspecified: Secondary | ICD-10-CM | POA: Diagnosis not present

## 2014-06-08 DIAGNOSIS — L02419 Cutaneous abscess of limb, unspecified: Secondary | ICD-10-CM | POA: Diagnosis not present

## 2014-06-08 DIAGNOSIS — L03119 Cellulitis of unspecified part of limb: Secondary | ICD-10-CM | POA: Diagnosis not present

## 2014-06-08 DIAGNOSIS — IMO0001 Reserved for inherently not codable concepts without codable children: Secondary | ICD-10-CM | POA: Diagnosis not present

## 2014-06-08 DIAGNOSIS — Z4789 Encounter for other orthopedic aftercare: Secondary | ICD-10-CM | POA: Diagnosis not present

## 2014-06-08 DIAGNOSIS — L02619 Cutaneous abscess of unspecified foot: Secondary | ICD-10-CM | POA: Diagnosis not present

## 2014-06-12 DIAGNOSIS — IMO0001 Reserved for inherently not codable concepts without codable children: Secondary | ICD-10-CM | POA: Diagnosis not present

## 2014-06-12 DIAGNOSIS — L03119 Cellulitis of unspecified part of limb: Secondary | ICD-10-CM | POA: Diagnosis not present

## 2014-06-12 DIAGNOSIS — Z452 Encounter for adjustment and management of vascular access device: Secondary | ICD-10-CM | POA: Diagnosis not present

## 2014-06-12 DIAGNOSIS — M009 Pyogenic arthritis, unspecified: Secondary | ICD-10-CM | POA: Diagnosis not present

## 2014-06-12 DIAGNOSIS — L02619 Cutaneous abscess of unspecified foot: Secondary | ICD-10-CM | POA: Diagnosis not present

## 2014-06-12 DIAGNOSIS — L02419 Cutaneous abscess of limb, unspecified: Secondary | ICD-10-CM | POA: Diagnosis not present

## 2014-06-12 DIAGNOSIS — Z4789 Encounter for other orthopedic aftercare: Secondary | ICD-10-CM | POA: Diagnosis not present

## 2014-06-15 DIAGNOSIS — E782 Mixed hyperlipidemia: Secondary | ICD-10-CM | POA: Diagnosis not present

## 2014-06-15 DIAGNOSIS — G9050 Complex regional pain syndrome I, unspecified: Secondary | ICD-10-CM | POA: Diagnosis not present

## 2014-06-15 DIAGNOSIS — E1149 Type 2 diabetes mellitus with other diabetic neurological complication: Secondary | ICD-10-CM | POA: Diagnosis not present

## 2014-06-15 DIAGNOSIS — M545 Low back pain, unspecified: Secondary | ICD-10-CM | POA: Diagnosis not present

## 2014-06-15 DIAGNOSIS — E039 Hypothyroidism, unspecified: Secondary | ICD-10-CM | POA: Diagnosis not present

## 2014-06-15 DIAGNOSIS — I10 Essential (primary) hypertension: Secondary | ICD-10-CM | POA: Diagnosis not present

## 2014-06-15 DIAGNOSIS — E1142 Type 2 diabetes mellitus with diabetic polyneuropathy: Secondary | ICD-10-CM | POA: Diagnosis not present

## 2014-06-15 DIAGNOSIS — M86179 Other acute osteomyelitis, unspecified ankle and foot: Secondary | ICD-10-CM | POA: Diagnosis not present

## 2014-06-19 DIAGNOSIS — L02619 Cutaneous abscess of unspecified foot: Secondary | ICD-10-CM | POA: Diagnosis not present

## 2014-06-19 DIAGNOSIS — IMO0001 Reserved for inherently not codable concepts without codable children: Secondary | ICD-10-CM | POA: Diagnosis not present

## 2014-06-19 DIAGNOSIS — L02419 Cutaneous abscess of limb, unspecified: Secondary | ICD-10-CM | POA: Diagnosis not present

## 2014-06-19 DIAGNOSIS — L03119 Cellulitis of unspecified part of limb: Secondary | ICD-10-CM | POA: Diagnosis not present

## 2014-06-19 DIAGNOSIS — Z452 Encounter for adjustment and management of vascular access device: Secondary | ICD-10-CM | POA: Diagnosis not present

## 2014-06-19 DIAGNOSIS — Z4789 Encounter for other orthopedic aftercare: Secondary | ICD-10-CM | POA: Diagnosis not present

## 2014-06-19 DIAGNOSIS — M009 Pyogenic arthritis, unspecified: Secondary | ICD-10-CM | POA: Diagnosis not present

## 2014-06-27 DIAGNOSIS — Z452 Encounter for adjustment and management of vascular access device: Secondary | ICD-10-CM | POA: Diagnosis not present

## 2014-06-27 DIAGNOSIS — L02619 Cutaneous abscess of unspecified foot: Secondary | ICD-10-CM | POA: Diagnosis not present

## 2014-06-27 DIAGNOSIS — M009 Pyogenic arthritis, unspecified: Secondary | ICD-10-CM | POA: Diagnosis not present

## 2014-06-27 DIAGNOSIS — L03119 Cellulitis of unspecified part of limb: Secondary | ICD-10-CM | POA: Diagnosis not present

## 2014-06-27 DIAGNOSIS — IMO0001 Reserved for inherently not codable concepts without codable children: Secondary | ICD-10-CM | POA: Diagnosis not present

## 2014-06-27 DIAGNOSIS — Z4789 Encounter for other orthopedic aftercare: Secondary | ICD-10-CM | POA: Diagnosis not present

## 2014-06-27 DIAGNOSIS — L02419 Cutaneous abscess of limb, unspecified: Secondary | ICD-10-CM | POA: Diagnosis not present

## 2014-07-04 DIAGNOSIS — Z1231 Encounter for screening mammogram for malignant neoplasm of breast: Secondary | ICD-10-CM | POA: Diagnosis not present

## 2014-07-10 ENCOUNTER — Encounter: Payer: Self-pay | Admitting: Infectious Diseases

## 2014-07-12 DIAGNOSIS — N6009 Solitary cyst of unspecified breast: Secondary | ICD-10-CM | POA: Diagnosis not present

## 2014-07-12 DIAGNOSIS — R928 Other abnormal and inconclusive findings on diagnostic imaging of breast: Secondary | ICD-10-CM | POA: Diagnosis not present

## 2014-07-26 DIAGNOSIS — Z9889 Other specified postprocedural states: Secondary | ICD-10-CM | POA: Diagnosis not present

## 2014-07-26 DIAGNOSIS — M2140 Flat foot [pes planus] (acquired), unspecified foot: Secondary | ICD-10-CM | POA: Diagnosis not present

## 2014-08-24 DIAGNOSIS — M79609 Pain in unspecified limb: Secondary | ICD-10-CM | POA: Diagnosis not present

## 2014-08-24 DIAGNOSIS — L600 Ingrowing nail: Secondary | ICD-10-CM | POA: Diagnosis not present

## 2014-08-30 DIAGNOSIS — Z9889 Other specified postprocedural states: Secondary | ICD-10-CM | POA: Diagnosis not present

## 2014-08-30 DIAGNOSIS — M2140 Flat foot [pes planus] (acquired), unspecified foot: Secondary | ICD-10-CM | POA: Diagnosis not present

## 2014-09-07 DIAGNOSIS — L600 Ingrowing nail: Secondary | ICD-10-CM | POA: Diagnosis not present

## 2014-09-07 DIAGNOSIS — M79671 Pain in right foot: Secondary | ICD-10-CM | POA: Diagnosis not present

## 2014-09-21 ENCOUNTER — Other Ambulatory Visit: Payer: Medicare Other

## 2014-09-29 DIAGNOSIS — K210 Gastro-esophageal reflux disease with esophagitis: Secondary | ICD-10-CM | POA: Diagnosis not present

## 2014-09-29 DIAGNOSIS — E119 Type 2 diabetes mellitus without complications: Secondary | ICD-10-CM | POA: Diagnosis not present

## 2014-09-29 DIAGNOSIS — I10 Essential (primary) hypertension: Secondary | ICD-10-CM | POA: Diagnosis not present

## 2014-09-29 DIAGNOSIS — E782 Mixed hyperlipidemia: Secondary | ICD-10-CM | POA: Diagnosis not present

## 2014-09-29 DIAGNOSIS — E039 Hypothyroidism, unspecified: Secondary | ICD-10-CM | POA: Diagnosis not present

## 2014-10-03 DIAGNOSIS — E039 Hypothyroidism, unspecified: Secondary | ICD-10-CM | POA: Diagnosis not present

## 2014-10-03 DIAGNOSIS — Z23 Encounter for immunization: Secondary | ICD-10-CM | POA: Diagnosis not present

## 2014-10-03 DIAGNOSIS — G6289 Other specified polyneuropathies: Secondary | ICD-10-CM | POA: Diagnosis not present

## 2014-10-03 DIAGNOSIS — I10 Essential (primary) hypertension: Secondary | ICD-10-CM | POA: Diagnosis not present

## 2014-10-03 DIAGNOSIS — Z1389 Encounter for screening for other disorder: Secondary | ICD-10-CM | POA: Diagnosis not present

## 2014-10-03 DIAGNOSIS — E8881 Metabolic syndrome: Secondary | ICD-10-CM | POA: Diagnosis not present

## 2014-10-03 DIAGNOSIS — E782 Mixed hyperlipidemia: Secondary | ICD-10-CM | POA: Diagnosis not present

## 2014-10-03 DIAGNOSIS — E6609 Other obesity due to excess calories: Secondary | ICD-10-CM | POA: Diagnosis not present

## 2014-10-03 DIAGNOSIS — E1122 Type 2 diabetes mellitus with diabetic chronic kidney disease: Secondary | ICD-10-CM | POA: Diagnosis not present

## 2014-10-03 DIAGNOSIS — K210 Gastro-esophageal reflux disease with esophagitis: Secondary | ICD-10-CM | POA: Diagnosis not present

## 2014-10-05 ENCOUNTER — Ambulatory Visit: Payer: Medicare Other | Admitting: Internal Medicine

## 2014-10-05 DIAGNOSIS — M810 Age-related osteoporosis without current pathological fracture: Secondary | ICD-10-CM | POA: Diagnosis not present

## 2014-10-05 DIAGNOSIS — Z780 Asymptomatic menopausal state: Secondary | ICD-10-CM | POA: Diagnosis not present

## 2014-10-25 DIAGNOSIS — M19072 Primary osteoarthritis, left ankle and foot: Secondary | ICD-10-CM | POA: Diagnosis not present

## 2014-12-16 DIAGNOSIS — M810 Age-related osteoporosis without current pathological fracture: Secondary | ICD-10-CM | POA: Diagnosis not present

## 2014-12-16 DIAGNOSIS — L03032 Cellulitis of left toe: Secondary | ICD-10-CM | POA: Diagnosis not present

## 2014-12-16 DIAGNOSIS — E1140 Type 2 diabetes mellitus with diabetic neuropathy, unspecified: Secondary | ICD-10-CM | POA: Diagnosis not present

## 2014-12-16 DIAGNOSIS — Z87891 Personal history of nicotine dependence: Secondary | ICD-10-CM | POA: Diagnosis not present

## 2014-12-16 DIAGNOSIS — Z9111 Patient's noncompliance with dietary regimen: Secondary | ICD-10-CM | POA: Diagnosis not present

## 2014-12-16 DIAGNOSIS — Z882 Allergy status to sulfonamides status: Secondary | ICD-10-CM | POA: Diagnosis not present

## 2014-12-16 DIAGNOSIS — E11649 Type 2 diabetes mellitus with hypoglycemia without coma: Secondary | ICD-10-CM | POA: Diagnosis not present

## 2014-12-16 DIAGNOSIS — E669 Obesity, unspecified: Secondary | ICD-10-CM | POA: Diagnosis not present

## 2014-12-16 DIAGNOSIS — E039 Hypothyroidism, unspecified: Secondary | ICD-10-CM | POA: Diagnosis not present

## 2014-12-19 DIAGNOSIS — S93602A Unspecified sprain of left foot, initial encounter: Secondary | ICD-10-CM | POA: Diagnosis not present

## 2014-12-19 DIAGNOSIS — L03032 Cellulitis of left toe: Secondary | ICD-10-CM | POA: Diagnosis not present

## 2014-12-21 DIAGNOSIS — L89894 Pressure ulcer of other site, stage 4: Secondary | ICD-10-CM | POA: Diagnosis not present

## 2014-12-21 DIAGNOSIS — M79674 Pain in right toe(s): Secondary | ICD-10-CM | POA: Diagnosis not present

## 2014-12-21 DIAGNOSIS — L03031 Cellulitis of right toe: Secondary | ICD-10-CM | POA: Diagnosis not present

## 2014-12-21 DIAGNOSIS — L03032 Cellulitis of left toe: Secondary | ICD-10-CM | POA: Diagnosis not present

## 2014-12-28 DIAGNOSIS — L03031 Cellulitis of right toe: Secondary | ICD-10-CM | POA: Diagnosis not present

## 2014-12-28 DIAGNOSIS — M79674 Pain in right toe(s): Secondary | ICD-10-CM | POA: Diagnosis not present

## 2014-12-28 DIAGNOSIS — L03032 Cellulitis of left toe: Secondary | ICD-10-CM | POA: Diagnosis not present

## 2014-12-28 DIAGNOSIS — L89894 Pressure ulcer of other site, stage 4: Secondary | ICD-10-CM | POA: Diagnosis not present

## 2014-12-31 DIAGNOSIS — E119 Type 2 diabetes mellitus without complications: Secondary | ICD-10-CM | POA: Diagnosis not present

## 2014-12-31 DIAGNOSIS — L97521 Non-pressure chronic ulcer of other part of left foot limited to breakdown of skin: Secondary | ICD-10-CM | POA: Diagnosis not present

## 2014-12-31 DIAGNOSIS — Z794 Long term (current) use of insulin: Secondary | ICD-10-CM | POA: Diagnosis not present

## 2014-12-31 DIAGNOSIS — K219 Gastro-esophageal reflux disease without esophagitis: Secondary | ICD-10-CM | POA: Diagnosis not present

## 2014-12-31 DIAGNOSIS — G600 Hereditary motor and sensory neuropathy: Secondary | ICD-10-CM | POA: Diagnosis not present

## 2014-12-31 DIAGNOSIS — I10 Essential (primary) hypertension: Secondary | ICD-10-CM | POA: Diagnosis not present

## 2015-01-01 DIAGNOSIS — E119 Type 2 diabetes mellitus without complications: Secondary | ICD-10-CM | POA: Diagnosis not present

## 2015-01-01 DIAGNOSIS — L97521 Non-pressure chronic ulcer of other part of left foot limited to breakdown of skin: Secondary | ICD-10-CM | POA: Diagnosis not present

## 2015-01-01 DIAGNOSIS — Z794 Long term (current) use of insulin: Secondary | ICD-10-CM | POA: Diagnosis not present

## 2015-01-01 DIAGNOSIS — I10 Essential (primary) hypertension: Secondary | ICD-10-CM | POA: Diagnosis not present

## 2015-01-01 DIAGNOSIS — K219 Gastro-esophageal reflux disease without esophagitis: Secondary | ICD-10-CM | POA: Diagnosis not present

## 2015-01-01 DIAGNOSIS — G600 Hereditary motor and sensory neuropathy: Secondary | ICD-10-CM | POA: Diagnosis not present

## 2015-01-03 DIAGNOSIS — L03032 Cellulitis of left toe: Secondary | ICD-10-CM | POA: Diagnosis not present

## 2015-01-03 DIAGNOSIS — M79674 Pain in right toe(s): Secondary | ICD-10-CM | POA: Diagnosis not present

## 2015-01-03 DIAGNOSIS — L89894 Pressure ulcer of other site, stage 4: Secondary | ICD-10-CM | POA: Diagnosis not present

## 2015-01-03 DIAGNOSIS — L03031 Cellulitis of right toe: Secondary | ICD-10-CM | POA: Diagnosis not present

## 2015-01-05 DIAGNOSIS — Z794 Long term (current) use of insulin: Secondary | ICD-10-CM | POA: Diagnosis not present

## 2015-01-05 DIAGNOSIS — K219 Gastro-esophageal reflux disease without esophagitis: Secondary | ICD-10-CM | POA: Diagnosis not present

## 2015-01-05 DIAGNOSIS — L97521 Non-pressure chronic ulcer of other part of left foot limited to breakdown of skin: Secondary | ICD-10-CM | POA: Diagnosis not present

## 2015-01-05 DIAGNOSIS — G600 Hereditary motor and sensory neuropathy: Secondary | ICD-10-CM | POA: Diagnosis not present

## 2015-01-05 DIAGNOSIS — I10 Essential (primary) hypertension: Secondary | ICD-10-CM | POA: Diagnosis not present

## 2015-01-05 DIAGNOSIS — E119 Type 2 diabetes mellitus without complications: Secondary | ICD-10-CM | POA: Diagnosis not present

## 2015-01-09 DIAGNOSIS — E119 Type 2 diabetes mellitus without complications: Secondary | ICD-10-CM | POA: Diagnosis not present

## 2015-01-09 DIAGNOSIS — G600 Hereditary motor and sensory neuropathy: Secondary | ICD-10-CM | POA: Diagnosis not present

## 2015-01-09 DIAGNOSIS — K219 Gastro-esophageal reflux disease without esophagitis: Secondary | ICD-10-CM | POA: Diagnosis not present

## 2015-01-09 DIAGNOSIS — Z794 Long term (current) use of insulin: Secondary | ICD-10-CM | POA: Diagnosis not present

## 2015-01-09 DIAGNOSIS — I10 Essential (primary) hypertension: Secondary | ICD-10-CM | POA: Diagnosis not present

## 2015-01-09 DIAGNOSIS — L97521 Non-pressure chronic ulcer of other part of left foot limited to breakdown of skin: Secondary | ICD-10-CM | POA: Diagnosis not present

## 2015-01-10 DIAGNOSIS — Z794 Long term (current) use of insulin: Secondary | ICD-10-CM | POA: Diagnosis not present

## 2015-01-10 DIAGNOSIS — G600 Hereditary motor and sensory neuropathy: Secondary | ICD-10-CM | POA: Diagnosis not present

## 2015-01-10 DIAGNOSIS — I10 Essential (primary) hypertension: Secondary | ICD-10-CM | POA: Diagnosis not present

## 2015-01-10 DIAGNOSIS — K219 Gastro-esophageal reflux disease without esophagitis: Secondary | ICD-10-CM | POA: Diagnosis not present

## 2015-01-10 DIAGNOSIS — E119 Type 2 diabetes mellitus without complications: Secondary | ICD-10-CM | POA: Diagnosis not present

## 2015-01-10 DIAGNOSIS — L97521 Non-pressure chronic ulcer of other part of left foot limited to breakdown of skin: Secondary | ICD-10-CM | POA: Diagnosis not present

## 2015-01-16 DIAGNOSIS — E039 Hypothyroidism, unspecified: Secondary | ICD-10-CM | POA: Diagnosis not present

## 2015-01-16 DIAGNOSIS — I10 Essential (primary) hypertension: Secondary | ICD-10-CM | POA: Diagnosis not present

## 2015-01-16 DIAGNOSIS — M25562 Pain in left knee: Secondary | ICD-10-CM | POA: Diagnosis not present

## 2015-01-16 DIAGNOSIS — E782 Mixed hyperlipidemia: Secondary | ICD-10-CM | POA: Diagnosis not present

## 2015-01-16 DIAGNOSIS — E119 Type 2 diabetes mellitus without complications: Secondary | ICD-10-CM | POA: Diagnosis not present

## 2015-01-16 DIAGNOSIS — M170 Bilateral primary osteoarthritis of knee: Secondary | ICD-10-CM | POA: Diagnosis not present

## 2015-01-17 DIAGNOSIS — L03032 Cellulitis of left toe: Secondary | ICD-10-CM | POA: Diagnosis not present

## 2015-01-17 DIAGNOSIS — L03031 Cellulitis of right toe: Secondary | ICD-10-CM | POA: Diagnosis not present

## 2015-01-17 DIAGNOSIS — M79674 Pain in right toe(s): Secondary | ICD-10-CM | POA: Diagnosis not present

## 2015-01-17 DIAGNOSIS — L89894 Pressure ulcer of other site, stage 4: Secondary | ICD-10-CM | POA: Diagnosis not present

## 2015-01-18 DIAGNOSIS — Z794 Long term (current) use of insulin: Secondary | ICD-10-CM | POA: Diagnosis not present

## 2015-01-18 DIAGNOSIS — G600 Hereditary motor and sensory neuropathy: Secondary | ICD-10-CM | POA: Diagnosis not present

## 2015-01-18 DIAGNOSIS — L97521 Non-pressure chronic ulcer of other part of left foot limited to breakdown of skin: Secondary | ICD-10-CM | POA: Diagnosis not present

## 2015-01-18 DIAGNOSIS — E119 Type 2 diabetes mellitus without complications: Secondary | ICD-10-CM | POA: Diagnosis not present

## 2015-01-18 DIAGNOSIS — I10 Essential (primary) hypertension: Secondary | ICD-10-CM | POA: Diagnosis not present

## 2015-01-18 DIAGNOSIS — K219 Gastro-esophageal reflux disease without esophagitis: Secondary | ICD-10-CM | POA: Diagnosis not present

## 2015-01-23 DIAGNOSIS — G9050 Complex regional pain syndrome I, unspecified: Secondary | ICD-10-CM | POA: Diagnosis not present

## 2015-01-23 DIAGNOSIS — M810 Age-related osteoporosis without current pathological fracture: Secondary | ICD-10-CM | POA: Diagnosis not present

## 2015-01-23 DIAGNOSIS — E782 Mixed hyperlipidemia: Secondary | ICD-10-CM | POA: Diagnosis not present

## 2015-01-23 DIAGNOSIS — G6289 Other specified polyneuropathies: Secondary | ICD-10-CM | POA: Diagnosis not present

## 2015-01-23 DIAGNOSIS — K219 Gastro-esophageal reflux disease without esophagitis: Secondary | ICD-10-CM | POA: Diagnosis not present

## 2015-01-23 DIAGNOSIS — M545 Low back pain: Secondary | ICD-10-CM | POA: Diagnosis not present

## 2015-01-23 DIAGNOSIS — E1140 Type 2 diabetes mellitus with diabetic neuropathy, unspecified: Secondary | ICD-10-CM | POA: Diagnosis not present

## 2015-01-23 DIAGNOSIS — E1122 Type 2 diabetes mellitus with diabetic chronic kidney disease: Secondary | ICD-10-CM | POA: Diagnosis not present

## 2015-01-23 DIAGNOSIS — E039 Hypothyroidism, unspecified: Secondary | ICD-10-CM | POA: Diagnosis not present

## 2015-01-23 DIAGNOSIS — E8881 Metabolic syndrome: Secondary | ICD-10-CM | POA: Diagnosis not present

## 2015-01-23 DIAGNOSIS — E6609 Other obesity due to excess calories: Secondary | ICD-10-CM | POA: Diagnosis not present

## 2015-01-23 DIAGNOSIS — I10 Essential (primary) hypertension: Secondary | ICD-10-CM | POA: Diagnosis not present

## 2015-01-31 DIAGNOSIS — M170 Bilateral primary osteoarthritis of knee: Secondary | ICD-10-CM | POA: Diagnosis not present

## 2015-01-31 DIAGNOSIS — M25561 Pain in right knee: Secondary | ICD-10-CM | POA: Diagnosis not present

## 2015-01-31 DIAGNOSIS — M75102 Unspecified rotator cuff tear or rupture of left shoulder, not specified as traumatic: Secondary | ICD-10-CM | POA: Diagnosis not present

## 2015-01-31 DIAGNOSIS — M25512 Pain in left shoulder: Secondary | ICD-10-CM | POA: Diagnosis not present

## 2015-02-01 DIAGNOSIS — L89894 Pressure ulcer of other site, stage 4: Secondary | ICD-10-CM | POA: Diagnosis not present

## 2015-02-01 DIAGNOSIS — M79674 Pain in right toe(s): Secondary | ICD-10-CM | POA: Diagnosis not present

## 2015-02-01 DIAGNOSIS — L03031 Cellulitis of right toe: Secondary | ICD-10-CM | POA: Diagnosis not present

## 2015-02-01 DIAGNOSIS — L03032 Cellulitis of left toe: Secondary | ICD-10-CM | POA: Diagnosis not present

## 2015-02-07 DIAGNOSIS — I10 Essential (primary) hypertension: Secondary | ICD-10-CM | POA: Diagnosis not present

## 2015-02-07 DIAGNOSIS — E119 Type 2 diabetes mellitus without complications: Secondary | ICD-10-CM | POA: Diagnosis not present

## 2015-02-07 DIAGNOSIS — G600 Hereditary motor and sensory neuropathy: Secondary | ICD-10-CM | POA: Diagnosis not present

## 2015-02-07 DIAGNOSIS — Z794 Long term (current) use of insulin: Secondary | ICD-10-CM | POA: Diagnosis not present

## 2015-02-07 DIAGNOSIS — L97521 Non-pressure chronic ulcer of other part of left foot limited to breakdown of skin: Secondary | ICD-10-CM | POA: Diagnosis not present

## 2015-02-07 DIAGNOSIS — K219 Gastro-esophageal reflux disease without esophagitis: Secondary | ICD-10-CM | POA: Diagnosis not present

## 2015-02-15 DIAGNOSIS — L03032 Cellulitis of left toe: Secondary | ICD-10-CM | POA: Diagnosis not present

## 2015-02-15 DIAGNOSIS — L03031 Cellulitis of right toe: Secondary | ICD-10-CM | POA: Diagnosis not present

## 2015-02-15 DIAGNOSIS — M79674 Pain in right toe(s): Secondary | ICD-10-CM | POA: Diagnosis not present

## 2015-02-15 DIAGNOSIS — L89894 Pressure ulcer of other site, stage 4: Secondary | ICD-10-CM | POA: Diagnosis not present

## 2015-02-26 DIAGNOSIS — L97521 Non-pressure chronic ulcer of other part of left foot limited to breakdown of skin: Secondary | ICD-10-CM | POA: Diagnosis not present

## 2015-02-26 DIAGNOSIS — I10 Essential (primary) hypertension: Secondary | ICD-10-CM | POA: Diagnosis not present

## 2015-02-26 DIAGNOSIS — K219 Gastro-esophageal reflux disease without esophagitis: Secondary | ICD-10-CM | POA: Diagnosis not present

## 2015-02-26 DIAGNOSIS — E119 Type 2 diabetes mellitus without complications: Secondary | ICD-10-CM | POA: Diagnosis not present

## 2015-02-26 DIAGNOSIS — G600 Hereditary motor and sensory neuropathy: Secondary | ICD-10-CM | POA: Diagnosis not present

## 2015-02-26 DIAGNOSIS — Z794 Long term (current) use of insulin: Secondary | ICD-10-CM | POA: Diagnosis not present

## 2015-02-28 DIAGNOSIS — M170 Bilateral primary osteoarthritis of knee: Secondary | ICD-10-CM | POA: Diagnosis not present

## 2015-02-28 DIAGNOSIS — M7542 Impingement syndrome of left shoulder: Secondary | ICD-10-CM | POA: Diagnosis not present

## 2015-03-15 DIAGNOSIS — M79674 Pain in right toe(s): Secondary | ICD-10-CM | POA: Diagnosis not present

## 2015-03-15 DIAGNOSIS — L03032 Cellulitis of left toe: Secondary | ICD-10-CM | POA: Diagnosis not present

## 2015-03-15 DIAGNOSIS — L89894 Pressure ulcer of other site, stage 4: Secondary | ICD-10-CM | POA: Diagnosis not present

## 2015-03-15 DIAGNOSIS — L03031 Cellulitis of right toe: Secondary | ICD-10-CM | POA: Diagnosis not present

## 2015-03-22 DIAGNOSIS — M79671 Pain in right foot: Secondary | ICD-10-CM | POA: Diagnosis not present

## 2015-03-22 DIAGNOSIS — G600 Hereditary motor and sensory neuropathy: Secondary | ICD-10-CM | POA: Diagnosis not present

## 2015-03-22 DIAGNOSIS — M792 Neuralgia and neuritis, unspecified: Secondary | ICD-10-CM | POA: Diagnosis not present

## 2015-03-22 DIAGNOSIS — G5790 Unspecified mononeuropathy of unspecified lower limb: Secondary | ICD-10-CM | POA: Diagnosis not present

## 2015-03-26 DIAGNOSIS — M792 Neuralgia and neuritis, unspecified: Secondary | ICD-10-CM | POA: Diagnosis not present

## 2015-03-26 DIAGNOSIS — G600 Hereditary motor and sensory neuropathy: Secondary | ICD-10-CM | POA: Diagnosis not present

## 2015-03-26 DIAGNOSIS — M79671 Pain in right foot: Secondary | ICD-10-CM | POA: Diagnosis not present

## 2015-03-26 DIAGNOSIS — G5790 Unspecified mononeuropathy of unspecified lower limb: Secondary | ICD-10-CM | POA: Diagnosis not present

## 2015-03-28 DIAGNOSIS — G600 Hereditary motor and sensory neuropathy: Secondary | ICD-10-CM | POA: Diagnosis not present

## 2015-03-28 DIAGNOSIS — M792 Neuralgia and neuritis, unspecified: Secondary | ICD-10-CM | POA: Diagnosis not present

## 2015-03-28 DIAGNOSIS — G5790 Unspecified mononeuropathy of unspecified lower limb: Secondary | ICD-10-CM | POA: Diagnosis not present

## 2015-03-28 DIAGNOSIS — M79671 Pain in right foot: Secondary | ICD-10-CM | POA: Diagnosis not present

## 2015-04-05 DIAGNOSIS — M79671 Pain in right foot: Secondary | ICD-10-CM | POA: Diagnosis not present

## 2015-04-05 DIAGNOSIS — M792 Neuralgia and neuritis, unspecified: Secondary | ICD-10-CM | POA: Diagnosis not present

## 2015-04-05 DIAGNOSIS — G600 Hereditary motor and sensory neuropathy: Secondary | ICD-10-CM | POA: Diagnosis not present

## 2015-04-05 DIAGNOSIS — G5790 Unspecified mononeuropathy of unspecified lower limb: Secondary | ICD-10-CM | POA: Diagnosis not present

## 2015-04-09 DIAGNOSIS — G5790 Unspecified mononeuropathy of unspecified lower limb: Secondary | ICD-10-CM | POA: Diagnosis not present

## 2015-04-09 DIAGNOSIS — M79671 Pain in right foot: Secondary | ICD-10-CM | POA: Diagnosis not present

## 2015-04-09 DIAGNOSIS — M792 Neuralgia and neuritis, unspecified: Secondary | ICD-10-CM | POA: Diagnosis not present

## 2015-04-09 DIAGNOSIS — G600 Hereditary motor and sensory neuropathy: Secondary | ICD-10-CM | POA: Diagnosis not present

## 2015-04-12 DIAGNOSIS — G600 Hereditary motor and sensory neuropathy: Secondary | ICD-10-CM | POA: Diagnosis not present

## 2015-04-12 DIAGNOSIS — M79671 Pain in right foot: Secondary | ICD-10-CM | POA: Diagnosis not present

## 2015-04-12 DIAGNOSIS — M792 Neuralgia and neuritis, unspecified: Secondary | ICD-10-CM | POA: Diagnosis not present

## 2015-04-12 DIAGNOSIS — G5790 Unspecified mononeuropathy of unspecified lower limb: Secondary | ICD-10-CM | POA: Diagnosis not present

## 2015-05-08 DIAGNOSIS — E782 Mixed hyperlipidemia: Secondary | ICD-10-CM | POA: Diagnosis not present

## 2015-05-08 DIAGNOSIS — E119 Type 2 diabetes mellitus without complications: Secondary | ICD-10-CM | POA: Diagnosis not present

## 2015-05-08 DIAGNOSIS — E039 Hypothyroidism, unspecified: Secondary | ICD-10-CM | POA: Diagnosis not present

## 2015-05-14 DIAGNOSIS — G6289 Other specified polyneuropathies: Secondary | ICD-10-CM | POA: Diagnosis not present

## 2015-05-14 DIAGNOSIS — E039 Hypothyroidism, unspecified: Secondary | ICD-10-CM | POA: Diagnosis not present

## 2015-05-14 DIAGNOSIS — E8881 Metabolic syndrome: Secondary | ICD-10-CM | POA: Diagnosis not present

## 2015-05-14 DIAGNOSIS — M545 Low back pain: Secondary | ICD-10-CM | POA: Diagnosis not present

## 2015-05-14 DIAGNOSIS — M810 Age-related osteoporosis without current pathological fracture: Secondary | ICD-10-CM | POA: Diagnosis not present

## 2015-05-14 DIAGNOSIS — K219 Gastro-esophageal reflux disease without esophagitis: Secondary | ICD-10-CM | POA: Diagnosis not present

## 2015-05-14 DIAGNOSIS — I10 Essential (primary) hypertension: Secondary | ICD-10-CM | POA: Diagnosis not present

## 2015-05-14 DIAGNOSIS — E782 Mixed hyperlipidemia: Secondary | ICD-10-CM | POA: Diagnosis not present

## 2015-05-14 DIAGNOSIS — E1140 Type 2 diabetes mellitus with diabetic neuropathy, unspecified: Secondary | ICD-10-CM | POA: Diagnosis not present

## 2015-05-14 DIAGNOSIS — E1122 Type 2 diabetes mellitus with diabetic chronic kidney disease: Secondary | ICD-10-CM | POA: Diagnosis not present

## 2015-05-14 DIAGNOSIS — E6609 Other obesity due to excess calories: Secondary | ICD-10-CM | POA: Diagnosis not present

## 2015-05-31 DIAGNOSIS — G600 Hereditary motor and sensory neuropathy: Secondary | ICD-10-CM | POA: Diagnosis not present

## 2015-05-31 DIAGNOSIS — G5790 Unspecified mononeuropathy of unspecified lower limb: Secondary | ICD-10-CM | POA: Diagnosis not present

## 2015-05-31 DIAGNOSIS — M792 Neuralgia and neuritis, unspecified: Secondary | ICD-10-CM | POA: Diagnosis not present

## 2015-05-31 DIAGNOSIS — M79671 Pain in right foot: Secondary | ICD-10-CM | POA: Diagnosis not present

## 2015-06-21 DIAGNOSIS — H5203 Hypermetropia, bilateral: Secondary | ICD-10-CM | POA: Diagnosis not present

## 2015-06-21 DIAGNOSIS — H2513 Age-related nuclear cataract, bilateral: Secondary | ICD-10-CM | POA: Diagnosis not present

## 2015-06-21 DIAGNOSIS — H00029 Hordeolum internum unspecified eye, unspecified eyelid: Secondary | ICD-10-CM | POA: Diagnosis not present

## 2015-06-21 DIAGNOSIS — E119 Type 2 diabetes mellitus without complications: Secondary | ICD-10-CM | POA: Diagnosis not present

## 2015-07-30 DIAGNOSIS — G4700 Insomnia, unspecified: Secondary | ICD-10-CM | POA: Diagnosis not present

## 2015-07-30 DIAGNOSIS — G6289 Other specified polyneuropathies: Secondary | ICD-10-CM | POA: Diagnosis not present

## 2015-07-30 DIAGNOSIS — E782 Mixed hyperlipidemia: Secondary | ICD-10-CM | POA: Diagnosis not present

## 2015-08-07 DIAGNOSIS — Z23 Encounter for immunization: Secondary | ICD-10-CM | POA: Diagnosis not present

## 2015-08-07 DIAGNOSIS — E6609 Other obesity due to excess calories: Secondary | ICD-10-CM | POA: Diagnosis not present

## 2015-08-07 DIAGNOSIS — G9050 Complex regional pain syndrome I, unspecified: Secondary | ICD-10-CM | POA: Diagnosis not present

## 2015-08-07 DIAGNOSIS — E8881 Metabolic syndrome: Secondary | ICD-10-CM | POA: Diagnosis not present

## 2015-08-07 DIAGNOSIS — I10 Essential (primary) hypertension: Secondary | ICD-10-CM | POA: Diagnosis not present

## 2015-08-07 DIAGNOSIS — E1140 Type 2 diabetes mellitus with diabetic neuropathy, unspecified: Secondary | ICD-10-CM | POA: Diagnosis not present

## 2015-08-07 DIAGNOSIS — E782 Mixed hyperlipidemia: Secondary | ICD-10-CM | POA: Diagnosis not present

## 2015-08-07 DIAGNOSIS — K219 Gastro-esophageal reflux disease without esophagitis: Secondary | ICD-10-CM | POA: Diagnosis not present

## 2015-08-07 DIAGNOSIS — E1122 Type 2 diabetes mellitus with diabetic chronic kidney disease: Secondary | ICD-10-CM | POA: Diagnosis not present

## 2015-08-07 DIAGNOSIS — G6289 Other specified polyneuropathies: Secondary | ICD-10-CM | POA: Diagnosis not present

## 2015-08-07 DIAGNOSIS — E039 Hypothyroidism, unspecified: Secondary | ICD-10-CM | POA: Diagnosis not present

## 2015-08-07 DIAGNOSIS — M810 Age-related osteoporosis without current pathological fracture: Secondary | ICD-10-CM | POA: Diagnosis not present

## 2015-08-10 DIAGNOSIS — M810 Age-related osteoporosis without current pathological fracture: Secondary | ICD-10-CM | POA: Diagnosis not present

## 2015-08-10 DIAGNOSIS — Z9181 History of falling: Secondary | ICD-10-CM | POA: Diagnosis not present

## 2015-08-10 DIAGNOSIS — E1140 Type 2 diabetes mellitus with diabetic neuropathy, unspecified: Secondary | ICD-10-CM | POA: Diagnosis not present

## 2015-08-10 DIAGNOSIS — N189 Chronic kidney disease, unspecified: Secondary | ICD-10-CM | POA: Diagnosis not present

## 2015-08-10 DIAGNOSIS — F329 Major depressive disorder, single episode, unspecified: Secondary | ICD-10-CM | POA: Diagnosis not present

## 2015-08-10 DIAGNOSIS — E6609 Other obesity due to excess calories: Secondary | ICD-10-CM | POA: Diagnosis not present

## 2015-08-10 DIAGNOSIS — G9050 Complex regional pain syndrome I, unspecified: Secondary | ICD-10-CM | POA: Diagnosis not present

## 2015-08-10 DIAGNOSIS — I129 Hypertensive chronic kidney disease with stage 1 through stage 4 chronic kidney disease, or unspecified chronic kidney disease: Secondary | ICD-10-CM | POA: Diagnosis not present

## 2015-08-10 DIAGNOSIS — E1122 Type 2 diabetes mellitus with diabetic chronic kidney disease: Secondary | ICD-10-CM | POA: Diagnosis not present

## 2015-08-10 DIAGNOSIS — K7581 Nonalcoholic steatohepatitis (NASH): Secondary | ICD-10-CM | POA: Diagnosis not present

## 2015-08-10 DIAGNOSIS — E8881 Metabolic syndrome: Secondary | ICD-10-CM | POA: Diagnosis not present

## 2015-08-15 DIAGNOSIS — E1122 Type 2 diabetes mellitus with diabetic chronic kidney disease: Secondary | ICD-10-CM | POA: Diagnosis not present

## 2015-08-15 DIAGNOSIS — I129 Hypertensive chronic kidney disease with stage 1 through stage 4 chronic kidney disease, or unspecified chronic kidney disease: Secondary | ICD-10-CM | POA: Diagnosis not present

## 2015-08-15 DIAGNOSIS — E6609 Other obesity due to excess calories: Secondary | ICD-10-CM | POA: Diagnosis not present

## 2015-08-15 DIAGNOSIS — N189 Chronic kidney disease, unspecified: Secondary | ICD-10-CM | POA: Diagnosis not present

## 2015-08-15 DIAGNOSIS — E1140 Type 2 diabetes mellitus with diabetic neuropathy, unspecified: Secondary | ICD-10-CM | POA: Diagnosis not present

## 2015-08-15 DIAGNOSIS — G9050 Complex regional pain syndrome I, unspecified: Secondary | ICD-10-CM | POA: Diagnosis not present

## 2015-08-17 DIAGNOSIS — I129 Hypertensive chronic kidney disease with stage 1 through stage 4 chronic kidney disease, or unspecified chronic kidney disease: Secondary | ICD-10-CM | POA: Diagnosis not present

## 2015-08-17 DIAGNOSIS — N189 Chronic kidney disease, unspecified: Secondary | ICD-10-CM | POA: Diagnosis not present

## 2015-08-17 DIAGNOSIS — E1140 Type 2 diabetes mellitus with diabetic neuropathy, unspecified: Secondary | ICD-10-CM | POA: Diagnosis not present

## 2015-08-17 DIAGNOSIS — G9050 Complex regional pain syndrome I, unspecified: Secondary | ICD-10-CM | POA: Diagnosis not present

## 2015-08-17 DIAGNOSIS — E6609 Other obesity due to excess calories: Secondary | ICD-10-CM | POA: Diagnosis not present

## 2015-08-17 DIAGNOSIS — E1122 Type 2 diabetes mellitus with diabetic chronic kidney disease: Secondary | ICD-10-CM | POA: Diagnosis not present

## 2015-08-20 DIAGNOSIS — E1122 Type 2 diabetes mellitus with diabetic chronic kidney disease: Secondary | ICD-10-CM | POA: Diagnosis not present

## 2015-08-20 DIAGNOSIS — E1140 Type 2 diabetes mellitus with diabetic neuropathy, unspecified: Secondary | ICD-10-CM | POA: Diagnosis not present

## 2015-08-20 DIAGNOSIS — N189 Chronic kidney disease, unspecified: Secondary | ICD-10-CM | POA: Diagnosis not present

## 2015-08-20 DIAGNOSIS — I129 Hypertensive chronic kidney disease with stage 1 through stage 4 chronic kidney disease, or unspecified chronic kidney disease: Secondary | ICD-10-CM | POA: Diagnosis not present

## 2015-08-21 DIAGNOSIS — I129 Hypertensive chronic kidney disease with stage 1 through stage 4 chronic kidney disease, or unspecified chronic kidney disease: Secondary | ICD-10-CM | POA: Diagnosis not present

## 2015-08-21 DIAGNOSIS — E6609 Other obesity due to excess calories: Secondary | ICD-10-CM | POA: Diagnosis not present

## 2015-08-21 DIAGNOSIS — N189 Chronic kidney disease, unspecified: Secondary | ICD-10-CM | POA: Diagnosis not present

## 2015-08-21 DIAGNOSIS — E1122 Type 2 diabetes mellitus with diabetic chronic kidney disease: Secondary | ICD-10-CM | POA: Diagnosis not present

## 2015-08-21 DIAGNOSIS — E1140 Type 2 diabetes mellitus with diabetic neuropathy, unspecified: Secondary | ICD-10-CM | POA: Diagnosis not present

## 2015-08-21 DIAGNOSIS — G9050 Complex regional pain syndrome I, unspecified: Secondary | ICD-10-CM | POA: Diagnosis not present

## 2015-08-22 DIAGNOSIS — E6609 Other obesity due to excess calories: Secondary | ICD-10-CM | POA: Diagnosis not present

## 2015-08-22 DIAGNOSIS — N189 Chronic kidney disease, unspecified: Secondary | ICD-10-CM | POA: Diagnosis not present

## 2015-08-22 DIAGNOSIS — G9050 Complex regional pain syndrome I, unspecified: Secondary | ICD-10-CM | POA: Diagnosis not present

## 2015-08-22 DIAGNOSIS — E1122 Type 2 diabetes mellitus with diabetic chronic kidney disease: Secondary | ICD-10-CM | POA: Diagnosis not present

## 2015-08-22 DIAGNOSIS — I129 Hypertensive chronic kidney disease with stage 1 through stage 4 chronic kidney disease, or unspecified chronic kidney disease: Secondary | ICD-10-CM | POA: Diagnosis not present

## 2015-08-22 DIAGNOSIS — E1140 Type 2 diabetes mellitus with diabetic neuropathy, unspecified: Secondary | ICD-10-CM | POA: Diagnosis not present

## 2015-08-23 DIAGNOSIS — L110 Acquired keratosis follicularis: Secondary | ICD-10-CM | POA: Diagnosis not present

## 2015-08-23 DIAGNOSIS — M79671 Pain in right foot: Secondary | ICD-10-CM | POA: Diagnosis not present

## 2015-08-23 DIAGNOSIS — G9050 Complex regional pain syndrome I, unspecified: Secondary | ICD-10-CM | POA: Diagnosis not present

## 2015-08-23 DIAGNOSIS — E6609 Other obesity due to excess calories: Secondary | ICD-10-CM | POA: Diagnosis not present

## 2015-08-23 DIAGNOSIS — G600 Hereditary motor and sensory neuropathy: Secondary | ICD-10-CM | POA: Diagnosis not present

## 2015-08-23 DIAGNOSIS — I129 Hypertensive chronic kidney disease with stage 1 through stage 4 chronic kidney disease, or unspecified chronic kidney disease: Secondary | ICD-10-CM | POA: Diagnosis not present

## 2015-08-23 DIAGNOSIS — N189 Chronic kidney disease, unspecified: Secondary | ICD-10-CM | POA: Diagnosis not present

## 2015-08-23 DIAGNOSIS — L609 Nail disorder, unspecified: Secondary | ICD-10-CM | POA: Diagnosis not present

## 2015-08-23 DIAGNOSIS — E1122 Type 2 diabetes mellitus with diabetic chronic kidney disease: Secondary | ICD-10-CM | POA: Diagnosis not present

## 2015-08-23 DIAGNOSIS — E1140 Type 2 diabetes mellitus with diabetic neuropathy, unspecified: Secondary | ICD-10-CM | POA: Diagnosis not present

## 2015-08-24 DIAGNOSIS — E6609 Other obesity due to excess calories: Secondary | ICD-10-CM | POA: Diagnosis not present

## 2015-08-24 DIAGNOSIS — N189 Chronic kidney disease, unspecified: Secondary | ICD-10-CM | POA: Diagnosis not present

## 2015-08-24 DIAGNOSIS — G9050 Complex regional pain syndrome I, unspecified: Secondary | ICD-10-CM | POA: Diagnosis not present

## 2015-08-24 DIAGNOSIS — E1140 Type 2 diabetes mellitus with diabetic neuropathy, unspecified: Secondary | ICD-10-CM | POA: Diagnosis not present

## 2015-08-24 DIAGNOSIS — I129 Hypertensive chronic kidney disease with stage 1 through stage 4 chronic kidney disease, or unspecified chronic kidney disease: Secondary | ICD-10-CM | POA: Diagnosis not present

## 2015-08-24 DIAGNOSIS — E1122 Type 2 diabetes mellitus with diabetic chronic kidney disease: Secondary | ICD-10-CM | POA: Diagnosis not present

## 2015-08-27 DIAGNOSIS — E1122 Type 2 diabetes mellitus with diabetic chronic kidney disease: Secondary | ICD-10-CM | POA: Diagnosis not present

## 2015-08-27 DIAGNOSIS — I129 Hypertensive chronic kidney disease with stage 1 through stage 4 chronic kidney disease, or unspecified chronic kidney disease: Secondary | ICD-10-CM | POA: Diagnosis not present

## 2015-08-27 DIAGNOSIS — G9050 Complex regional pain syndrome I, unspecified: Secondary | ICD-10-CM | POA: Diagnosis not present

## 2015-08-27 DIAGNOSIS — E6609 Other obesity due to excess calories: Secondary | ICD-10-CM | POA: Diagnosis not present

## 2015-08-27 DIAGNOSIS — N189 Chronic kidney disease, unspecified: Secondary | ICD-10-CM | POA: Diagnosis not present

## 2015-08-27 DIAGNOSIS — E1140 Type 2 diabetes mellitus with diabetic neuropathy, unspecified: Secondary | ICD-10-CM | POA: Diagnosis not present

## 2015-08-28 DIAGNOSIS — E1140 Type 2 diabetes mellitus with diabetic neuropathy, unspecified: Secondary | ICD-10-CM | POA: Diagnosis not present

## 2015-08-28 DIAGNOSIS — E6609 Other obesity due to excess calories: Secondary | ICD-10-CM | POA: Diagnosis not present

## 2015-08-28 DIAGNOSIS — I129 Hypertensive chronic kidney disease with stage 1 through stage 4 chronic kidney disease, or unspecified chronic kidney disease: Secondary | ICD-10-CM | POA: Diagnosis not present

## 2015-08-28 DIAGNOSIS — G9050 Complex regional pain syndrome I, unspecified: Secondary | ICD-10-CM | POA: Diagnosis not present

## 2015-08-28 DIAGNOSIS — E1122 Type 2 diabetes mellitus with diabetic chronic kidney disease: Secondary | ICD-10-CM | POA: Diagnosis not present

## 2015-08-28 DIAGNOSIS — N189 Chronic kidney disease, unspecified: Secondary | ICD-10-CM | POA: Diagnosis not present

## 2015-08-29 DIAGNOSIS — N189 Chronic kidney disease, unspecified: Secondary | ICD-10-CM | POA: Diagnosis not present

## 2015-08-29 DIAGNOSIS — E1122 Type 2 diabetes mellitus with diabetic chronic kidney disease: Secondary | ICD-10-CM | POA: Diagnosis not present

## 2015-08-29 DIAGNOSIS — E1140 Type 2 diabetes mellitus with diabetic neuropathy, unspecified: Secondary | ICD-10-CM | POA: Diagnosis not present

## 2015-08-29 DIAGNOSIS — E6609 Other obesity due to excess calories: Secondary | ICD-10-CM | POA: Diagnosis not present

## 2015-08-29 DIAGNOSIS — G9050 Complex regional pain syndrome I, unspecified: Secondary | ICD-10-CM | POA: Diagnosis not present

## 2015-08-29 DIAGNOSIS — I129 Hypertensive chronic kidney disease with stage 1 through stage 4 chronic kidney disease, or unspecified chronic kidney disease: Secondary | ICD-10-CM | POA: Diagnosis not present

## 2015-08-30 DIAGNOSIS — G9050 Complex regional pain syndrome I, unspecified: Secondary | ICD-10-CM | POA: Diagnosis not present

## 2015-08-30 DIAGNOSIS — E6609 Other obesity due to excess calories: Secondary | ICD-10-CM | POA: Diagnosis not present

## 2015-08-30 DIAGNOSIS — N189 Chronic kidney disease, unspecified: Secondary | ICD-10-CM | POA: Diagnosis not present

## 2015-08-30 DIAGNOSIS — E1140 Type 2 diabetes mellitus with diabetic neuropathy, unspecified: Secondary | ICD-10-CM | POA: Diagnosis not present

## 2015-08-30 DIAGNOSIS — I129 Hypertensive chronic kidney disease with stage 1 through stage 4 chronic kidney disease, or unspecified chronic kidney disease: Secondary | ICD-10-CM | POA: Diagnosis not present

## 2015-08-30 DIAGNOSIS — E1122 Type 2 diabetes mellitus with diabetic chronic kidney disease: Secondary | ICD-10-CM | POA: Diagnosis not present

## 2015-09-03 DIAGNOSIS — E1122 Type 2 diabetes mellitus with diabetic chronic kidney disease: Secondary | ICD-10-CM | POA: Diagnosis not present

## 2015-09-03 DIAGNOSIS — E1140 Type 2 diabetes mellitus with diabetic neuropathy, unspecified: Secondary | ICD-10-CM | POA: Diagnosis not present

## 2015-09-03 DIAGNOSIS — N189 Chronic kidney disease, unspecified: Secondary | ICD-10-CM | POA: Diagnosis not present

## 2015-09-03 DIAGNOSIS — E6609 Other obesity due to excess calories: Secondary | ICD-10-CM | POA: Diagnosis not present

## 2015-09-03 DIAGNOSIS — G9050 Complex regional pain syndrome I, unspecified: Secondary | ICD-10-CM | POA: Diagnosis not present

## 2015-09-03 DIAGNOSIS — I129 Hypertensive chronic kidney disease with stage 1 through stage 4 chronic kidney disease, or unspecified chronic kidney disease: Secondary | ICD-10-CM | POA: Diagnosis not present

## 2015-09-04 DIAGNOSIS — G9050 Complex regional pain syndrome I, unspecified: Secondary | ICD-10-CM | POA: Diagnosis not present

## 2015-09-04 DIAGNOSIS — I129 Hypertensive chronic kidney disease with stage 1 through stage 4 chronic kidney disease, or unspecified chronic kidney disease: Secondary | ICD-10-CM | POA: Diagnosis not present

## 2015-09-04 DIAGNOSIS — N189 Chronic kidney disease, unspecified: Secondary | ICD-10-CM | POA: Diagnosis not present

## 2015-09-04 DIAGNOSIS — E1122 Type 2 diabetes mellitus with diabetic chronic kidney disease: Secondary | ICD-10-CM | POA: Diagnosis not present

## 2015-09-04 DIAGNOSIS — E1140 Type 2 diabetes mellitus with diabetic neuropathy, unspecified: Secondary | ICD-10-CM | POA: Diagnosis not present

## 2015-09-04 DIAGNOSIS — E6609 Other obesity due to excess calories: Secondary | ICD-10-CM | POA: Diagnosis not present

## 2015-09-06 DIAGNOSIS — N189 Chronic kidney disease, unspecified: Secondary | ICD-10-CM | POA: Diagnosis not present

## 2015-09-06 DIAGNOSIS — I129 Hypertensive chronic kidney disease with stage 1 through stage 4 chronic kidney disease, or unspecified chronic kidney disease: Secondary | ICD-10-CM | POA: Diagnosis not present

## 2015-09-06 DIAGNOSIS — E1122 Type 2 diabetes mellitus with diabetic chronic kidney disease: Secondary | ICD-10-CM | POA: Diagnosis not present

## 2015-09-06 DIAGNOSIS — G9050 Complex regional pain syndrome I, unspecified: Secondary | ICD-10-CM | POA: Diagnosis not present

## 2015-09-06 DIAGNOSIS — E1140 Type 2 diabetes mellitus with diabetic neuropathy, unspecified: Secondary | ICD-10-CM | POA: Diagnosis not present

## 2015-09-06 DIAGNOSIS — E6609 Other obesity due to excess calories: Secondary | ICD-10-CM | POA: Diagnosis not present

## 2015-09-10 DIAGNOSIS — I129 Hypertensive chronic kidney disease with stage 1 through stage 4 chronic kidney disease, or unspecified chronic kidney disease: Secondary | ICD-10-CM | POA: Diagnosis not present

## 2015-09-10 DIAGNOSIS — G9050 Complex regional pain syndrome I, unspecified: Secondary | ICD-10-CM | POA: Diagnosis not present

## 2015-09-10 DIAGNOSIS — E1140 Type 2 diabetes mellitus with diabetic neuropathy, unspecified: Secondary | ICD-10-CM | POA: Diagnosis not present

## 2015-09-10 DIAGNOSIS — E1122 Type 2 diabetes mellitus with diabetic chronic kidney disease: Secondary | ICD-10-CM | POA: Diagnosis not present

## 2015-09-10 DIAGNOSIS — N189 Chronic kidney disease, unspecified: Secondary | ICD-10-CM | POA: Diagnosis not present

## 2015-09-10 DIAGNOSIS — E6609 Other obesity due to excess calories: Secondary | ICD-10-CM | POA: Diagnosis not present

## 2015-09-12 DIAGNOSIS — E1140 Type 2 diabetes mellitus with diabetic neuropathy, unspecified: Secondary | ICD-10-CM | POA: Diagnosis not present

## 2015-09-12 DIAGNOSIS — N189 Chronic kidney disease, unspecified: Secondary | ICD-10-CM | POA: Diagnosis not present

## 2015-09-12 DIAGNOSIS — E1122 Type 2 diabetes mellitus with diabetic chronic kidney disease: Secondary | ICD-10-CM | POA: Diagnosis not present

## 2015-09-12 DIAGNOSIS — G9050 Complex regional pain syndrome I, unspecified: Secondary | ICD-10-CM | POA: Diagnosis not present

## 2015-09-12 DIAGNOSIS — I129 Hypertensive chronic kidney disease with stage 1 through stage 4 chronic kidney disease, or unspecified chronic kidney disease: Secondary | ICD-10-CM | POA: Diagnosis not present

## 2015-09-12 DIAGNOSIS — E6609 Other obesity due to excess calories: Secondary | ICD-10-CM | POA: Diagnosis not present

## 2015-09-19 DIAGNOSIS — E6609 Other obesity due to excess calories: Secondary | ICD-10-CM | POA: Diagnosis not present

## 2015-09-19 DIAGNOSIS — E1122 Type 2 diabetes mellitus with diabetic chronic kidney disease: Secondary | ICD-10-CM | POA: Diagnosis not present

## 2015-09-19 DIAGNOSIS — N189 Chronic kidney disease, unspecified: Secondary | ICD-10-CM | POA: Diagnosis not present

## 2015-09-19 DIAGNOSIS — I129 Hypertensive chronic kidney disease with stage 1 through stage 4 chronic kidney disease, or unspecified chronic kidney disease: Secondary | ICD-10-CM | POA: Diagnosis not present

## 2015-09-19 DIAGNOSIS — E1140 Type 2 diabetes mellitus with diabetic neuropathy, unspecified: Secondary | ICD-10-CM | POA: Diagnosis not present

## 2015-09-19 DIAGNOSIS — G9050 Complex regional pain syndrome I, unspecified: Secondary | ICD-10-CM | POA: Diagnosis not present

## 2015-09-20 DIAGNOSIS — G9050 Complex regional pain syndrome I, unspecified: Secondary | ICD-10-CM | POA: Diagnosis not present

## 2015-09-20 DIAGNOSIS — I129 Hypertensive chronic kidney disease with stage 1 through stage 4 chronic kidney disease, or unspecified chronic kidney disease: Secondary | ICD-10-CM | POA: Diagnosis not present

## 2015-09-20 DIAGNOSIS — E1122 Type 2 diabetes mellitus with diabetic chronic kidney disease: Secondary | ICD-10-CM | POA: Diagnosis not present

## 2015-09-20 DIAGNOSIS — E1140 Type 2 diabetes mellitus with diabetic neuropathy, unspecified: Secondary | ICD-10-CM | POA: Diagnosis not present

## 2015-09-20 DIAGNOSIS — N189 Chronic kidney disease, unspecified: Secondary | ICD-10-CM | POA: Diagnosis not present

## 2015-09-20 DIAGNOSIS — E6609 Other obesity due to excess calories: Secondary | ICD-10-CM | POA: Diagnosis not present

## 2015-09-24 DIAGNOSIS — E1122 Type 2 diabetes mellitus with diabetic chronic kidney disease: Secondary | ICD-10-CM | POA: Diagnosis not present

## 2015-09-24 DIAGNOSIS — E1140 Type 2 diabetes mellitus with diabetic neuropathy, unspecified: Secondary | ICD-10-CM | POA: Diagnosis not present

## 2015-09-24 DIAGNOSIS — E6609 Other obesity due to excess calories: Secondary | ICD-10-CM | POA: Diagnosis not present

## 2015-09-24 DIAGNOSIS — G9050 Complex regional pain syndrome I, unspecified: Secondary | ICD-10-CM | POA: Diagnosis not present

## 2015-09-24 DIAGNOSIS — I129 Hypertensive chronic kidney disease with stage 1 through stage 4 chronic kidney disease, or unspecified chronic kidney disease: Secondary | ICD-10-CM | POA: Diagnosis not present

## 2015-09-24 DIAGNOSIS — N189 Chronic kidney disease, unspecified: Secondary | ICD-10-CM | POA: Diagnosis not present

## 2015-09-26 DIAGNOSIS — E1122 Type 2 diabetes mellitus with diabetic chronic kidney disease: Secondary | ICD-10-CM | POA: Diagnosis not present

## 2015-09-26 DIAGNOSIS — E1140 Type 2 diabetes mellitus with diabetic neuropathy, unspecified: Secondary | ICD-10-CM | POA: Diagnosis not present

## 2015-09-26 DIAGNOSIS — G9050 Complex regional pain syndrome I, unspecified: Secondary | ICD-10-CM | POA: Diagnosis not present

## 2015-09-26 DIAGNOSIS — N189 Chronic kidney disease, unspecified: Secondary | ICD-10-CM | POA: Diagnosis not present

## 2015-09-26 DIAGNOSIS — E6609 Other obesity due to excess calories: Secondary | ICD-10-CM | POA: Diagnosis not present

## 2015-09-26 DIAGNOSIS — I129 Hypertensive chronic kidney disease with stage 1 through stage 4 chronic kidney disease, or unspecified chronic kidney disease: Secondary | ICD-10-CM | POA: Diagnosis not present

## 2015-10-01 DIAGNOSIS — N189 Chronic kidney disease, unspecified: Secondary | ICD-10-CM | POA: Diagnosis not present

## 2015-10-01 DIAGNOSIS — I129 Hypertensive chronic kidney disease with stage 1 through stage 4 chronic kidney disease, or unspecified chronic kidney disease: Secondary | ICD-10-CM | POA: Diagnosis not present

## 2015-10-01 DIAGNOSIS — E6609 Other obesity due to excess calories: Secondary | ICD-10-CM | POA: Diagnosis not present

## 2015-10-01 DIAGNOSIS — E1122 Type 2 diabetes mellitus with diabetic chronic kidney disease: Secondary | ICD-10-CM | POA: Diagnosis not present

## 2015-10-01 DIAGNOSIS — G9050 Complex regional pain syndrome I, unspecified: Secondary | ICD-10-CM | POA: Diagnosis not present

## 2015-10-01 DIAGNOSIS — E1140 Type 2 diabetes mellitus with diabetic neuropathy, unspecified: Secondary | ICD-10-CM | POA: Diagnosis not present

## 2015-10-05 DIAGNOSIS — E6609 Other obesity due to excess calories: Secondary | ICD-10-CM | POA: Diagnosis not present

## 2015-10-05 DIAGNOSIS — N189 Chronic kidney disease, unspecified: Secondary | ICD-10-CM | POA: Diagnosis not present

## 2015-10-05 DIAGNOSIS — E1140 Type 2 diabetes mellitus with diabetic neuropathy, unspecified: Secondary | ICD-10-CM | POA: Diagnosis not present

## 2015-10-05 DIAGNOSIS — E1122 Type 2 diabetes mellitus with diabetic chronic kidney disease: Secondary | ICD-10-CM | POA: Diagnosis not present

## 2015-10-05 DIAGNOSIS — G9050 Complex regional pain syndrome I, unspecified: Secondary | ICD-10-CM | POA: Diagnosis not present

## 2015-10-05 DIAGNOSIS — I129 Hypertensive chronic kidney disease with stage 1 through stage 4 chronic kidney disease, or unspecified chronic kidney disease: Secondary | ICD-10-CM | POA: Diagnosis not present

## 2015-10-08 DIAGNOSIS — G9050 Complex regional pain syndrome I, unspecified: Secondary | ICD-10-CM | POA: Diagnosis not present

## 2015-10-08 DIAGNOSIS — N189 Chronic kidney disease, unspecified: Secondary | ICD-10-CM | POA: Diagnosis not present

## 2015-10-08 DIAGNOSIS — E6609 Other obesity due to excess calories: Secondary | ICD-10-CM | POA: Diagnosis not present

## 2015-10-08 DIAGNOSIS — I129 Hypertensive chronic kidney disease with stage 1 through stage 4 chronic kidney disease, or unspecified chronic kidney disease: Secondary | ICD-10-CM | POA: Diagnosis not present

## 2015-10-08 DIAGNOSIS — E1140 Type 2 diabetes mellitus with diabetic neuropathy, unspecified: Secondary | ICD-10-CM | POA: Diagnosis not present

## 2015-10-08 DIAGNOSIS — E1122 Type 2 diabetes mellitus with diabetic chronic kidney disease: Secondary | ICD-10-CM | POA: Diagnosis not present

## 2015-10-09 DIAGNOSIS — K7581 Nonalcoholic steatohepatitis (NASH): Secondary | ICD-10-CM | POA: Diagnosis not present

## 2015-10-09 DIAGNOSIS — E6609 Other obesity due to excess calories: Secondary | ICD-10-CM | POA: Diagnosis not present

## 2015-10-09 DIAGNOSIS — M810 Age-related osteoporosis without current pathological fracture: Secondary | ICD-10-CM | POA: Diagnosis not present

## 2015-10-09 DIAGNOSIS — I129 Hypertensive chronic kidney disease with stage 1 through stage 4 chronic kidney disease, or unspecified chronic kidney disease: Secondary | ICD-10-CM | POA: Diagnosis not present

## 2015-10-09 DIAGNOSIS — F329 Major depressive disorder, single episode, unspecified: Secondary | ICD-10-CM | POA: Diagnosis not present

## 2015-10-09 DIAGNOSIS — E8881 Metabolic syndrome: Secondary | ICD-10-CM | POA: Diagnosis not present

## 2015-10-09 DIAGNOSIS — E1122 Type 2 diabetes mellitus with diabetic chronic kidney disease: Secondary | ICD-10-CM | POA: Diagnosis not present

## 2015-10-09 DIAGNOSIS — Z7984 Long term (current) use of oral hypoglycemic drugs: Secondary | ICD-10-CM | POA: Diagnosis not present

## 2015-10-09 DIAGNOSIS — N189 Chronic kidney disease, unspecified: Secondary | ICD-10-CM | POA: Diagnosis not present

## 2015-10-09 DIAGNOSIS — Z794 Long term (current) use of insulin: Secondary | ICD-10-CM | POA: Diagnosis not present

## 2015-10-09 DIAGNOSIS — Z9181 History of falling: Secondary | ICD-10-CM | POA: Diagnosis not present

## 2015-10-09 DIAGNOSIS — E1140 Type 2 diabetes mellitus with diabetic neuropathy, unspecified: Secondary | ICD-10-CM | POA: Diagnosis not present

## 2015-10-09 DIAGNOSIS — G9050 Complex regional pain syndrome I, unspecified: Secondary | ICD-10-CM | POA: Diagnosis not present

## 2015-10-11 DIAGNOSIS — E1122 Type 2 diabetes mellitus with diabetic chronic kidney disease: Secondary | ICD-10-CM | POA: Diagnosis not present

## 2015-10-11 DIAGNOSIS — E6609 Other obesity due to excess calories: Secondary | ICD-10-CM | POA: Diagnosis not present

## 2015-10-11 DIAGNOSIS — N189 Chronic kidney disease, unspecified: Secondary | ICD-10-CM | POA: Diagnosis not present

## 2015-10-11 DIAGNOSIS — G9050 Complex regional pain syndrome I, unspecified: Secondary | ICD-10-CM | POA: Diagnosis not present

## 2015-10-11 DIAGNOSIS — I129 Hypertensive chronic kidney disease with stage 1 through stage 4 chronic kidney disease, or unspecified chronic kidney disease: Secondary | ICD-10-CM | POA: Diagnosis not present

## 2015-10-11 DIAGNOSIS — E1140 Type 2 diabetes mellitus with diabetic neuropathy, unspecified: Secondary | ICD-10-CM | POA: Diagnosis not present

## 2015-10-12 DIAGNOSIS — E6609 Other obesity due to excess calories: Secondary | ICD-10-CM | POA: Diagnosis not present

## 2015-10-12 DIAGNOSIS — E1140 Type 2 diabetes mellitus with diabetic neuropathy, unspecified: Secondary | ICD-10-CM | POA: Diagnosis not present

## 2015-10-12 DIAGNOSIS — G9050 Complex regional pain syndrome I, unspecified: Secondary | ICD-10-CM | POA: Diagnosis not present

## 2015-10-12 DIAGNOSIS — N189 Chronic kidney disease, unspecified: Secondary | ICD-10-CM | POA: Diagnosis not present

## 2015-10-12 DIAGNOSIS — E1122 Type 2 diabetes mellitus with diabetic chronic kidney disease: Secondary | ICD-10-CM | POA: Diagnosis not present

## 2015-10-12 DIAGNOSIS — I129 Hypertensive chronic kidney disease with stage 1 through stage 4 chronic kidney disease, or unspecified chronic kidney disease: Secondary | ICD-10-CM | POA: Diagnosis not present

## 2015-10-16 DIAGNOSIS — N189 Chronic kidney disease, unspecified: Secondary | ICD-10-CM | POA: Diagnosis not present

## 2015-10-16 DIAGNOSIS — E1122 Type 2 diabetes mellitus with diabetic chronic kidney disease: Secondary | ICD-10-CM | POA: Diagnosis not present

## 2015-10-16 DIAGNOSIS — G9050 Complex regional pain syndrome I, unspecified: Secondary | ICD-10-CM | POA: Diagnosis not present

## 2015-10-16 DIAGNOSIS — E6609 Other obesity due to excess calories: Secondary | ICD-10-CM | POA: Diagnosis not present

## 2015-10-16 DIAGNOSIS — E1140 Type 2 diabetes mellitus with diabetic neuropathy, unspecified: Secondary | ICD-10-CM | POA: Diagnosis not present

## 2015-10-16 DIAGNOSIS — I129 Hypertensive chronic kidney disease with stage 1 through stage 4 chronic kidney disease, or unspecified chronic kidney disease: Secondary | ICD-10-CM | POA: Diagnosis not present

## 2015-10-18 DIAGNOSIS — N189 Chronic kidney disease, unspecified: Secondary | ICD-10-CM | POA: Diagnosis not present

## 2015-10-18 DIAGNOSIS — I129 Hypertensive chronic kidney disease with stage 1 through stage 4 chronic kidney disease, or unspecified chronic kidney disease: Secondary | ICD-10-CM | POA: Diagnosis not present

## 2015-10-18 DIAGNOSIS — G9050 Complex regional pain syndrome I, unspecified: Secondary | ICD-10-CM | POA: Diagnosis not present

## 2015-10-18 DIAGNOSIS — E1140 Type 2 diabetes mellitus with diabetic neuropathy, unspecified: Secondary | ICD-10-CM | POA: Diagnosis not present

## 2015-10-18 DIAGNOSIS — E1122 Type 2 diabetes mellitus with diabetic chronic kidney disease: Secondary | ICD-10-CM | POA: Diagnosis not present

## 2015-10-18 DIAGNOSIS — E6609 Other obesity due to excess calories: Secondary | ICD-10-CM | POA: Diagnosis not present

## 2015-10-22 DIAGNOSIS — N189 Chronic kidney disease, unspecified: Secondary | ICD-10-CM | POA: Diagnosis not present

## 2015-10-22 DIAGNOSIS — I129 Hypertensive chronic kidney disease with stage 1 through stage 4 chronic kidney disease, or unspecified chronic kidney disease: Secondary | ICD-10-CM | POA: Diagnosis not present

## 2015-10-22 DIAGNOSIS — E1122 Type 2 diabetes mellitus with diabetic chronic kidney disease: Secondary | ICD-10-CM | POA: Diagnosis not present

## 2015-10-22 DIAGNOSIS — E6609 Other obesity due to excess calories: Secondary | ICD-10-CM | POA: Diagnosis not present

## 2015-10-22 DIAGNOSIS — E1140 Type 2 diabetes mellitus with diabetic neuropathy, unspecified: Secondary | ICD-10-CM | POA: Diagnosis not present

## 2015-10-22 DIAGNOSIS — G9050 Complex regional pain syndrome I, unspecified: Secondary | ICD-10-CM | POA: Diagnosis not present

## 2015-10-24 DIAGNOSIS — E1140 Type 2 diabetes mellitus with diabetic neuropathy, unspecified: Secondary | ICD-10-CM | POA: Diagnosis not present

## 2015-10-24 DIAGNOSIS — N189 Chronic kidney disease, unspecified: Secondary | ICD-10-CM | POA: Diagnosis not present

## 2015-10-24 DIAGNOSIS — I129 Hypertensive chronic kidney disease with stage 1 through stage 4 chronic kidney disease, or unspecified chronic kidney disease: Secondary | ICD-10-CM | POA: Diagnosis not present

## 2015-10-24 DIAGNOSIS — G9050 Complex regional pain syndrome I, unspecified: Secondary | ICD-10-CM | POA: Diagnosis not present

## 2015-10-24 DIAGNOSIS — E6609 Other obesity due to excess calories: Secondary | ICD-10-CM | POA: Diagnosis not present

## 2015-10-24 DIAGNOSIS — E1122 Type 2 diabetes mellitus with diabetic chronic kidney disease: Secondary | ICD-10-CM | POA: Diagnosis not present

## 2015-11-08 DIAGNOSIS — L110 Acquired keratosis follicularis: Secondary | ICD-10-CM | POA: Diagnosis not present

## 2015-11-08 DIAGNOSIS — L609 Nail disorder, unspecified: Secondary | ICD-10-CM | POA: Diagnosis not present

## 2015-11-08 DIAGNOSIS — M79672 Pain in left foot: Secondary | ICD-10-CM | POA: Diagnosis not present

## 2015-11-08 DIAGNOSIS — M79671 Pain in right foot: Secondary | ICD-10-CM | POA: Diagnosis not present

## 2015-11-15 DIAGNOSIS — M722 Plantar fascial fibromatosis: Secondary | ICD-10-CM | POA: Diagnosis not present

## 2015-11-15 DIAGNOSIS — M79672 Pain in left foot: Secondary | ICD-10-CM | POA: Diagnosis not present

## 2015-11-19 DIAGNOSIS — E039 Hypothyroidism, unspecified: Secondary | ICD-10-CM | POA: Diagnosis not present

## 2015-11-19 DIAGNOSIS — K7581 Nonalcoholic steatohepatitis (NASH): Secondary | ICD-10-CM | POA: Diagnosis not present

## 2015-11-19 DIAGNOSIS — I10 Essential (primary) hypertension: Secondary | ICD-10-CM | POA: Diagnosis not present

## 2015-11-19 DIAGNOSIS — E782 Mixed hyperlipidemia: Secondary | ICD-10-CM | POA: Diagnosis not present

## 2015-11-19 DIAGNOSIS — E8881 Metabolic syndrome: Secondary | ICD-10-CM | POA: Diagnosis not present

## 2015-11-19 DIAGNOSIS — G9050 Complex regional pain syndrome I, unspecified: Secondary | ICD-10-CM | POA: Diagnosis not present

## 2015-11-19 DIAGNOSIS — E119 Type 2 diabetes mellitus without complications: Secondary | ICD-10-CM | POA: Diagnosis not present

## 2015-11-26 DIAGNOSIS — N183 Chronic kidney disease, stage 3 (moderate): Secondary | ICD-10-CM | POA: Diagnosis not present

## 2015-11-26 DIAGNOSIS — E8881 Metabolic syndrome: Secondary | ICD-10-CM | POA: Diagnosis not present

## 2015-11-26 DIAGNOSIS — I10 Essential (primary) hypertension: Secondary | ICD-10-CM | POA: Diagnosis not present

## 2015-11-26 DIAGNOSIS — M810 Age-related osteoporosis without current pathological fracture: Secondary | ICD-10-CM | POA: Diagnosis not present

## 2015-11-26 DIAGNOSIS — E1122 Type 2 diabetes mellitus with diabetic chronic kidney disease: Secondary | ICD-10-CM | POA: Diagnosis not present

## 2015-11-26 DIAGNOSIS — E1140 Type 2 diabetes mellitus with diabetic neuropathy, unspecified: Secondary | ICD-10-CM | POA: Diagnosis not present

## 2015-11-26 DIAGNOSIS — G9050 Complex regional pain syndrome I, unspecified: Secondary | ICD-10-CM | POA: Diagnosis not present

## 2015-11-26 DIAGNOSIS — K219 Gastro-esophageal reflux disease without esophagitis: Secondary | ICD-10-CM | POA: Diagnosis not present

## 2015-11-26 DIAGNOSIS — E6609 Other obesity due to excess calories: Secondary | ICD-10-CM | POA: Diagnosis not present

## 2015-11-26 DIAGNOSIS — G6289 Other specified polyneuropathies: Secondary | ICD-10-CM | POA: Diagnosis not present

## 2015-11-26 DIAGNOSIS — K7581 Nonalcoholic steatohepatitis (NASH): Secondary | ICD-10-CM | POA: Diagnosis not present

## 2015-11-26 DIAGNOSIS — E782 Mixed hyperlipidemia: Secondary | ICD-10-CM | POA: Diagnosis not present

## 2015-12-06 DIAGNOSIS — M79671 Pain in right foot: Secondary | ICD-10-CM | POA: Diagnosis not present

## 2015-12-06 DIAGNOSIS — M722 Plantar fascial fibromatosis: Secondary | ICD-10-CM | POA: Diagnosis not present

## 2015-12-06 DIAGNOSIS — M79672 Pain in left foot: Secondary | ICD-10-CM | POA: Diagnosis not present

## 2016-01-07 DIAGNOSIS — E039 Hypothyroidism, unspecified: Secondary | ICD-10-CM | POA: Diagnosis not present

## 2016-01-08 DIAGNOSIS — E039 Hypothyroidism, unspecified: Secondary | ICD-10-CM | POA: Diagnosis not present

## 2016-02-12 DIAGNOSIS — M19071 Primary osteoarthritis, right ankle and foot: Secondary | ICD-10-CM | POA: Diagnosis not present

## 2016-02-12 DIAGNOSIS — G5792 Unspecified mononeuropathy of left lower limb: Secondary | ICD-10-CM | POA: Diagnosis not present

## 2016-02-12 DIAGNOSIS — G5791 Unspecified mononeuropathy of right lower limb: Secondary | ICD-10-CM | POA: Diagnosis not present

## 2016-02-12 DIAGNOSIS — M14672 Charcot's joint, left ankle and foot: Secondary | ICD-10-CM | POA: Diagnosis not present

## 2016-02-12 DIAGNOSIS — M19072 Primary osteoarthritis, left ankle and foot: Secondary | ICD-10-CM | POA: Diagnosis not present

## 2016-03-07 DIAGNOSIS — E8881 Metabolic syndrome: Secondary | ICD-10-CM | POA: Diagnosis not present

## 2016-03-07 DIAGNOSIS — E1122 Type 2 diabetes mellitus with diabetic chronic kidney disease: Secondary | ICD-10-CM | POA: Diagnosis not present

## 2016-03-07 DIAGNOSIS — E039 Hypothyroidism, unspecified: Secondary | ICD-10-CM | POA: Diagnosis not present

## 2016-03-07 DIAGNOSIS — N183 Chronic kidney disease, stage 3 (moderate): Secondary | ICD-10-CM | POA: Diagnosis not present

## 2016-03-07 DIAGNOSIS — E782 Mixed hyperlipidemia: Secondary | ICD-10-CM | POA: Diagnosis not present

## 2016-03-07 DIAGNOSIS — I10 Essential (primary) hypertension: Secondary | ICD-10-CM | POA: Diagnosis not present

## 2016-03-10 DIAGNOSIS — I10 Essential (primary) hypertension: Secondary | ICD-10-CM | POA: Diagnosis not present

## 2016-03-10 DIAGNOSIS — G6289 Other specified polyneuropathies: Secondary | ICD-10-CM | POA: Diagnosis not present

## 2016-03-10 DIAGNOSIS — E8881 Metabolic syndrome: Secondary | ICD-10-CM | POA: Diagnosis not present

## 2016-03-10 DIAGNOSIS — E1140 Type 2 diabetes mellitus with diabetic neuropathy, unspecified: Secondary | ICD-10-CM | POA: Diagnosis not present

## 2016-03-10 DIAGNOSIS — E782 Mixed hyperlipidemia: Secondary | ICD-10-CM | POA: Diagnosis not present

## 2016-03-10 DIAGNOSIS — E1122 Type 2 diabetes mellitus with diabetic chronic kidney disease: Secondary | ICD-10-CM | POA: Diagnosis not present

## 2016-03-10 DIAGNOSIS — E6609 Other obesity due to excess calories: Secondary | ICD-10-CM | POA: Diagnosis not present

## 2016-03-10 DIAGNOSIS — E039 Hypothyroidism, unspecified: Secondary | ICD-10-CM | POA: Diagnosis not present

## 2016-03-18 DIAGNOSIS — Z9049 Acquired absence of other specified parts of digestive tract: Secondary | ICD-10-CM | POA: Diagnosis not present

## 2016-03-18 DIAGNOSIS — R748 Abnormal levels of other serum enzymes: Secondary | ICD-10-CM | POA: Diagnosis not present

## 2016-03-18 DIAGNOSIS — R7989 Other specified abnormal findings of blood chemistry: Secondary | ICD-10-CM | POA: Diagnosis not present

## 2016-04-05 DIAGNOSIS — S199XXA Unspecified injury of neck, initial encounter: Secondary | ICD-10-CM | POA: Diagnosis not present

## 2016-04-05 DIAGNOSIS — S0990XA Unspecified injury of head, initial encounter: Secondary | ICD-10-CM | POA: Diagnosis not present

## 2016-04-05 DIAGNOSIS — K219 Gastro-esophageal reflux disease without esophagitis: Secondary | ICD-10-CM | POA: Diagnosis not present

## 2016-04-05 DIAGNOSIS — S2242XA Multiple fractures of ribs, left side, initial encounter for closed fracture: Secondary | ICD-10-CM | POA: Diagnosis not present

## 2016-04-05 DIAGNOSIS — Z6837 Body mass index (BMI) 37.0-37.9, adult: Secondary | ICD-10-CM | POA: Diagnosis not present

## 2016-04-05 DIAGNOSIS — S270XXA Traumatic pneumothorax, initial encounter: Secondary | ICD-10-CM | POA: Diagnosis not present

## 2016-04-05 DIAGNOSIS — M19012 Primary osteoarthritis, left shoulder: Secondary | ICD-10-CM | POA: Diagnosis not present

## 2016-04-05 DIAGNOSIS — E039 Hypothyroidism, unspecified: Secondary | ICD-10-CM | POA: Diagnosis not present

## 2016-04-05 DIAGNOSIS — I10 Essential (primary) hypertension: Secondary | ICD-10-CM | POA: Diagnosis not present

## 2016-04-05 DIAGNOSIS — Z794 Long term (current) use of insulin: Secondary | ICD-10-CM | POA: Diagnosis not present

## 2016-04-05 DIAGNOSIS — W108XXA Fall (on) (from) other stairs and steps, initial encounter: Secondary | ICD-10-CM | POA: Diagnosis not present

## 2016-04-05 DIAGNOSIS — Z882 Allergy status to sulfonamides status: Secondary | ICD-10-CM | POA: Diagnosis not present

## 2016-04-05 DIAGNOSIS — E1140 Type 2 diabetes mellitus with diabetic neuropathy, unspecified: Secondary | ICD-10-CM | POA: Diagnosis not present

## 2016-04-05 DIAGNOSIS — Z79899 Other long term (current) drug therapy: Secondary | ICD-10-CM | POA: Diagnosis not present

## 2016-04-06 DIAGNOSIS — S2242XA Multiple fractures of ribs, left side, initial encounter for closed fracture: Secondary | ICD-10-CM | POA: Diagnosis not present

## 2016-04-09 DIAGNOSIS — Z9181 History of falling: Secondary | ICD-10-CM | POA: Diagnosis not present

## 2016-04-09 DIAGNOSIS — Z794 Long term (current) use of insulin: Secondary | ICD-10-CM | POA: Diagnosis not present

## 2016-04-09 DIAGNOSIS — F329 Major depressive disorder, single episode, unspecified: Secondary | ICD-10-CM | POA: Diagnosis not present

## 2016-04-09 DIAGNOSIS — E1140 Type 2 diabetes mellitus with diabetic neuropathy, unspecified: Secondary | ICD-10-CM | POA: Diagnosis not present

## 2016-04-09 DIAGNOSIS — F419 Anxiety disorder, unspecified: Secondary | ICD-10-CM | POA: Diagnosis not present

## 2016-04-09 DIAGNOSIS — M810 Age-related osteoporosis without current pathological fracture: Secondary | ICD-10-CM | POA: Diagnosis not present

## 2016-04-09 DIAGNOSIS — E669 Obesity, unspecified: Secondary | ICD-10-CM | POA: Diagnosis not present

## 2016-04-09 DIAGNOSIS — S2242XD Multiple fractures of ribs, left side, subsequent encounter for fracture with routine healing: Secondary | ICD-10-CM | POA: Diagnosis not present

## 2016-04-09 DIAGNOSIS — I10 Essential (primary) hypertension: Secondary | ICD-10-CM | POA: Diagnosis not present

## 2016-04-09 IMAGING — CR DG CHEST 1V PORT
1 series · 1 of 1 positions shown · non-contrast
Comparison: Chest radiograph 02/23/2012

CLINICAL DATA: Cough

EXAM:
PORTABLE CHEST - 1 VIEW

[AP]
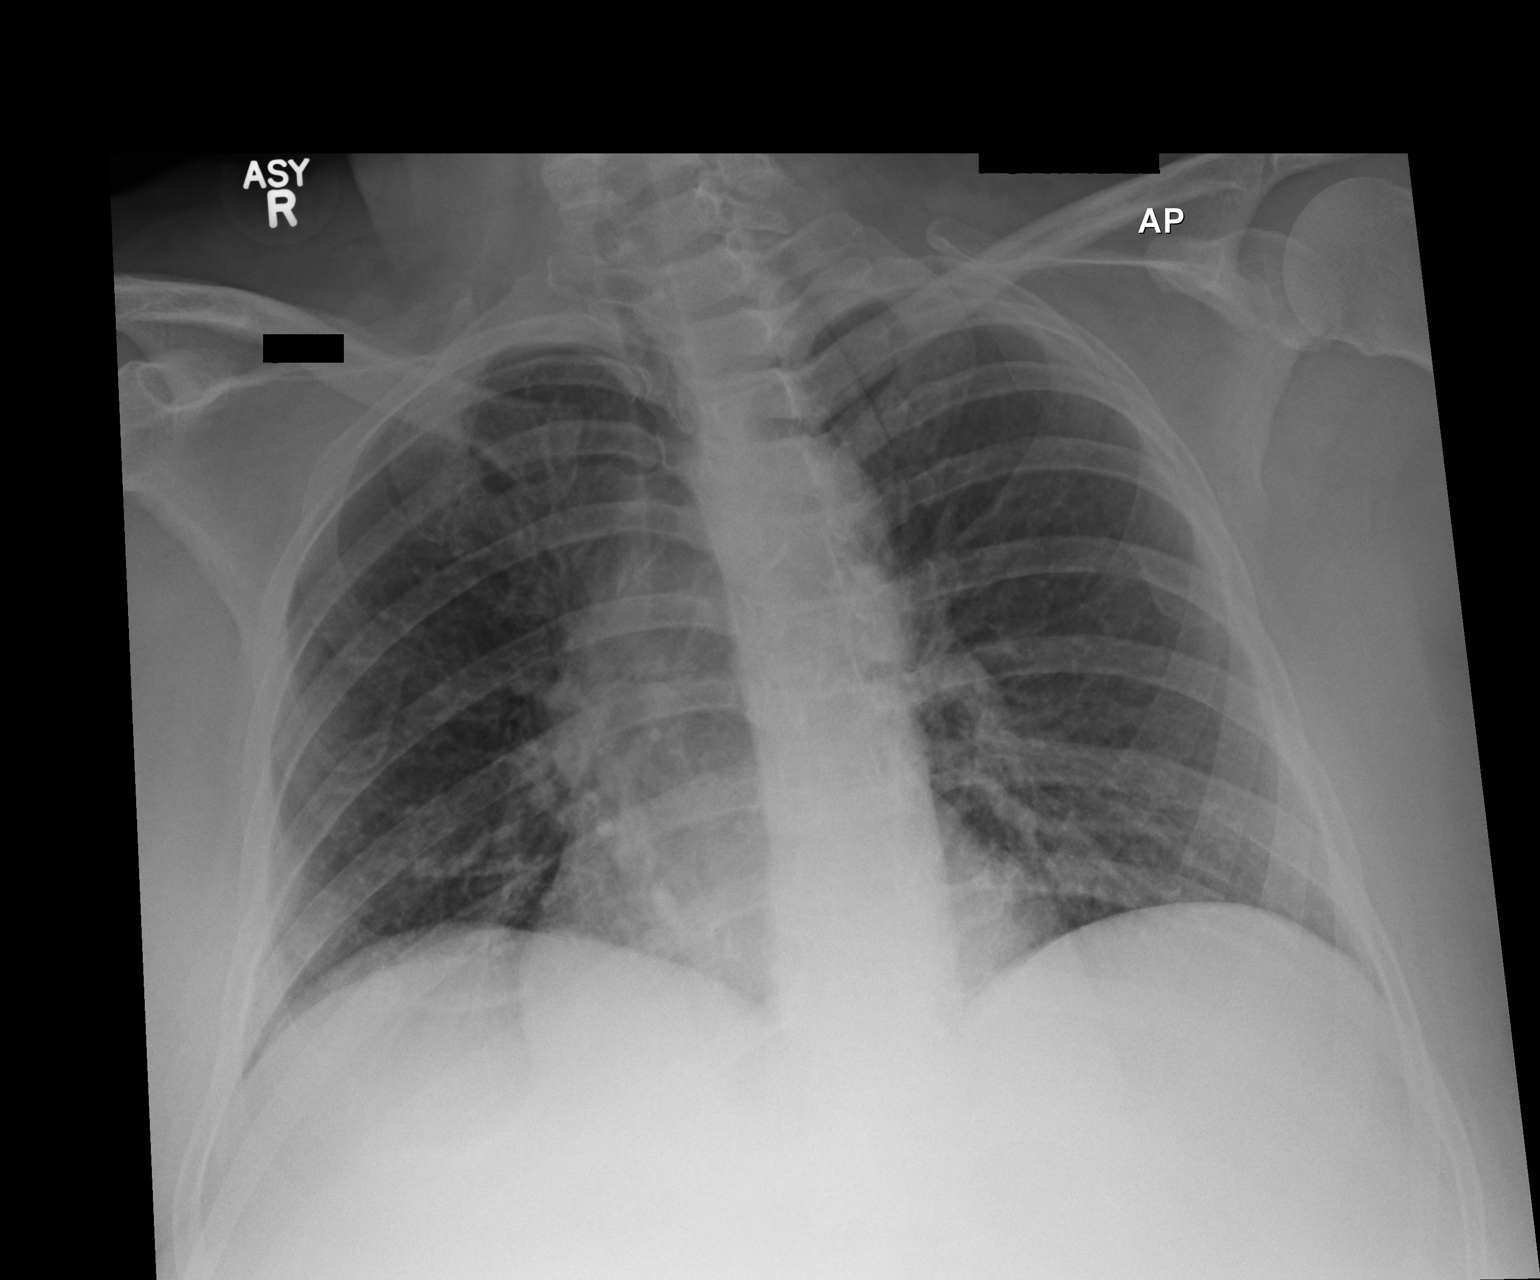

[1 of 1 positions shown; findings below may reference images not displayed]

FINDINGS: Lung volumes are low and the patient is rotated to the right. Heart,
mediastinal, and hilar contours appear within normal limits for
portable technique and low lung volumes. There is suggestion of
bilateral peribronchial thickening. No airspace consolidation,
edema, or pleural effusion identified. Negative for pneumothorax.
Bones unremarkable.
IMPRESSION: Low lung volumes. Question bilateral peribronchial thickening. This
can be seen in the setting of acute or chronic bronchitis, smoking,
or asthma.

## 2016-04-11 DIAGNOSIS — M810 Age-related osteoporosis without current pathological fracture: Secondary | ICD-10-CM | POA: Diagnosis not present

## 2016-04-11 DIAGNOSIS — F419 Anxiety disorder, unspecified: Secondary | ICD-10-CM | POA: Diagnosis not present

## 2016-04-11 DIAGNOSIS — I10 Essential (primary) hypertension: Secondary | ICD-10-CM | POA: Diagnosis not present

## 2016-04-11 DIAGNOSIS — E1140 Type 2 diabetes mellitus with diabetic neuropathy, unspecified: Secondary | ICD-10-CM | POA: Diagnosis not present

## 2016-04-11 DIAGNOSIS — F329 Major depressive disorder, single episode, unspecified: Secondary | ICD-10-CM | POA: Diagnosis not present

## 2016-04-11 DIAGNOSIS — S2242XD Multiple fractures of ribs, left side, subsequent encounter for fracture with routine healing: Secondary | ICD-10-CM | POA: Diagnosis not present

## 2016-04-15 DIAGNOSIS — E1140 Type 2 diabetes mellitus with diabetic neuropathy, unspecified: Secondary | ICD-10-CM | POA: Diagnosis not present

## 2016-04-15 DIAGNOSIS — F419 Anxiety disorder, unspecified: Secondary | ICD-10-CM | POA: Diagnosis not present

## 2016-04-15 DIAGNOSIS — F329 Major depressive disorder, single episode, unspecified: Secondary | ICD-10-CM | POA: Diagnosis not present

## 2016-04-15 DIAGNOSIS — S2242XD Multiple fractures of ribs, left side, subsequent encounter for fracture with routine healing: Secondary | ICD-10-CM | POA: Diagnosis not present

## 2016-04-15 DIAGNOSIS — I10 Essential (primary) hypertension: Secondary | ICD-10-CM | POA: Diagnosis not present

## 2016-04-15 DIAGNOSIS — M810 Age-related osteoporosis without current pathological fracture: Secondary | ICD-10-CM | POA: Diagnosis not present

## 2016-04-17 DIAGNOSIS — E1140 Type 2 diabetes mellitus with diabetic neuropathy, unspecified: Secondary | ICD-10-CM | POA: Diagnosis not present

## 2016-04-17 DIAGNOSIS — F329 Major depressive disorder, single episode, unspecified: Secondary | ICD-10-CM | POA: Diagnosis not present

## 2016-04-17 DIAGNOSIS — I10 Essential (primary) hypertension: Secondary | ICD-10-CM | POA: Diagnosis not present

## 2016-04-17 DIAGNOSIS — S2242XD Multiple fractures of ribs, left side, subsequent encounter for fracture with routine healing: Secondary | ICD-10-CM | POA: Diagnosis not present

## 2016-04-17 DIAGNOSIS — M810 Age-related osteoporosis without current pathological fracture: Secondary | ICD-10-CM | POA: Diagnosis not present

## 2016-04-17 DIAGNOSIS — F419 Anxiety disorder, unspecified: Secondary | ICD-10-CM | POA: Diagnosis not present

## 2016-04-22 DIAGNOSIS — F419 Anxiety disorder, unspecified: Secondary | ICD-10-CM | POA: Diagnosis not present

## 2016-04-22 DIAGNOSIS — E1140 Type 2 diabetes mellitus with diabetic neuropathy, unspecified: Secondary | ICD-10-CM | POA: Diagnosis not present

## 2016-04-22 DIAGNOSIS — S2242XD Multiple fractures of ribs, left side, subsequent encounter for fracture with routine healing: Secondary | ICD-10-CM | POA: Diagnosis not present

## 2016-04-22 DIAGNOSIS — F329 Major depressive disorder, single episode, unspecified: Secondary | ICD-10-CM | POA: Diagnosis not present

## 2016-04-22 DIAGNOSIS — I10 Essential (primary) hypertension: Secondary | ICD-10-CM | POA: Diagnosis not present

## 2016-04-22 DIAGNOSIS — M810 Age-related osteoporosis without current pathological fracture: Secondary | ICD-10-CM | POA: Diagnosis not present

## 2016-04-24 DIAGNOSIS — E1140 Type 2 diabetes mellitus with diabetic neuropathy, unspecified: Secondary | ICD-10-CM | POA: Diagnosis not present

## 2016-04-24 DIAGNOSIS — F419 Anxiety disorder, unspecified: Secondary | ICD-10-CM | POA: Diagnosis not present

## 2016-04-24 DIAGNOSIS — I10 Essential (primary) hypertension: Secondary | ICD-10-CM | POA: Diagnosis not present

## 2016-04-24 DIAGNOSIS — S2242XD Multiple fractures of ribs, left side, subsequent encounter for fracture with routine healing: Secondary | ICD-10-CM | POA: Diagnosis not present

## 2016-04-24 DIAGNOSIS — M810 Age-related osteoporosis without current pathological fracture: Secondary | ICD-10-CM | POA: Diagnosis not present

## 2016-04-24 DIAGNOSIS — F329 Major depressive disorder, single episode, unspecified: Secondary | ICD-10-CM | POA: Diagnosis not present

## 2016-04-29 DIAGNOSIS — I10 Essential (primary) hypertension: Secondary | ICD-10-CM | POA: Diagnosis not present

## 2016-04-29 DIAGNOSIS — S2242XD Multiple fractures of ribs, left side, subsequent encounter for fracture with routine healing: Secondary | ICD-10-CM | POA: Diagnosis not present

## 2016-04-29 DIAGNOSIS — F329 Major depressive disorder, single episode, unspecified: Secondary | ICD-10-CM | POA: Diagnosis not present

## 2016-04-29 DIAGNOSIS — E1140 Type 2 diabetes mellitus with diabetic neuropathy, unspecified: Secondary | ICD-10-CM | POA: Diagnosis not present

## 2016-04-29 DIAGNOSIS — M810 Age-related osteoporosis without current pathological fracture: Secondary | ICD-10-CM | POA: Diagnosis not present

## 2016-04-29 DIAGNOSIS — F419 Anxiety disorder, unspecified: Secondary | ICD-10-CM | POA: Diagnosis not present

## 2016-05-01 DIAGNOSIS — S2242XD Multiple fractures of ribs, left side, subsequent encounter for fracture with routine healing: Secondary | ICD-10-CM | POA: Diagnosis not present

## 2016-05-01 DIAGNOSIS — I10 Essential (primary) hypertension: Secondary | ICD-10-CM | POA: Diagnosis not present

## 2016-05-01 DIAGNOSIS — F329 Major depressive disorder, single episode, unspecified: Secondary | ICD-10-CM | POA: Diagnosis not present

## 2016-05-01 DIAGNOSIS — F419 Anxiety disorder, unspecified: Secondary | ICD-10-CM | POA: Diagnosis not present

## 2016-05-01 DIAGNOSIS — M810 Age-related osteoporosis without current pathological fracture: Secondary | ICD-10-CM | POA: Diagnosis not present

## 2016-05-01 DIAGNOSIS — E1140 Type 2 diabetes mellitus with diabetic neuropathy, unspecified: Secondary | ICD-10-CM | POA: Diagnosis not present

## 2016-05-05 DIAGNOSIS — F419 Anxiety disorder, unspecified: Secondary | ICD-10-CM | POA: Diagnosis not present

## 2016-05-05 DIAGNOSIS — F329 Major depressive disorder, single episode, unspecified: Secondary | ICD-10-CM | POA: Diagnosis not present

## 2016-05-05 DIAGNOSIS — I10 Essential (primary) hypertension: Secondary | ICD-10-CM | POA: Diagnosis not present

## 2016-05-05 DIAGNOSIS — E1140 Type 2 diabetes mellitus with diabetic neuropathy, unspecified: Secondary | ICD-10-CM | POA: Diagnosis not present

## 2016-05-05 DIAGNOSIS — M810 Age-related osteoporosis without current pathological fracture: Secondary | ICD-10-CM | POA: Diagnosis not present

## 2016-05-05 DIAGNOSIS — S2242XD Multiple fractures of ribs, left side, subsequent encounter for fracture with routine healing: Secondary | ICD-10-CM | POA: Diagnosis not present

## 2016-05-06 DIAGNOSIS — S2242XA Multiple fractures of ribs, left side, initial encounter for closed fracture: Secondary | ICD-10-CM | POA: Diagnosis not present

## 2016-05-07 DIAGNOSIS — M810 Age-related osteoporosis without current pathological fracture: Secondary | ICD-10-CM | POA: Diagnosis not present

## 2016-05-07 DIAGNOSIS — F419 Anxiety disorder, unspecified: Secondary | ICD-10-CM | POA: Diagnosis not present

## 2016-05-07 DIAGNOSIS — E1140 Type 2 diabetes mellitus with diabetic neuropathy, unspecified: Secondary | ICD-10-CM | POA: Diagnosis not present

## 2016-05-07 DIAGNOSIS — F329 Major depressive disorder, single episode, unspecified: Secondary | ICD-10-CM | POA: Diagnosis not present

## 2016-05-07 DIAGNOSIS — S2242XD Multiple fractures of ribs, left side, subsequent encounter for fracture with routine healing: Secondary | ICD-10-CM | POA: Diagnosis not present

## 2016-05-07 DIAGNOSIS — I10 Essential (primary) hypertension: Secondary | ICD-10-CM | POA: Diagnosis not present

## 2016-05-10 DIAGNOSIS — F419 Anxiety disorder, unspecified: Secondary | ICD-10-CM | POA: Diagnosis not present

## 2016-05-10 DIAGNOSIS — S2242XD Multiple fractures of ribs, left side, subsequent encounter for fracture with routine healing: Secondary | ICD-10-CM | POA: Diagnosis not present

## 2016-05-10 DIAGNOSIS — E1140 Type 2 diabetes mellitus with diabetic neuropathy, unspecified: Secondary | ICD-10-CM | POA: Diagnosis not present

## 2016-05-10 DIAGNOSIS — I10 Essential (primary) hypertension: Secondary | ICD-10-CM | POA: Diagnosis not present

## 2016-05-10 DIAGNOSIS — F329 Major depressive disorder, single episode, unspecified: Secondary | ICD-10-CM | POA: Diagnosis not present

## 2016-05-10 DIAGNOSIS — M810 Age-related osteoporosis without current pathological fracture: Secondary | ICD-10-CM | POA: Diagnosis not present

## 2016-05-14 DIAGNOSIS — M25562 Pain in left knee: Secondary | ICD-10-CM | POA: Diagnosis not present

## 2016-05-14 DIAGNOSIS — M25561 Pain in right knee: Secondary | ICD-10-CM | POA: Diagnosis not present

## 2016-05-14 DIAGNOSIS — G8929 Other chronic pain: Secondary | ICD-10-CM | POA: Diagnosis not present

## 2016-05-14 DIAGNOSIS — M170 Bilateral primary osteoarthritis of knee: Secondary | ICD-10-CM | POA: Diagnosis not present

## 2016-05-16 DIAGNOSIS — S2242XD Multiple fractures of ribs, left side, subsequent encounter for fracture with routine healing: Secondary | ICD-10-CM | POA: Diagnosis not present

## 2016-05-16 DIAGNOSIS — E1140 Type 2 diabetes mellitus with diabetic neuropathy, unspecified: Secondary | ICD-10-CM | POA: Diagnosis not present

## 2016-05-16 DIAGNOSIS — M810 Age-related osteoporosis without current pathological fracture: Secondary | ICD-10-CM | POA: Diagnosis not present

## 2016-05-16 DIAGNOSIS — F419 Anxiety disorder, unspecified: Secondary | ICD-10-CM | POA: Diagnosis not present

## 2016-05-16 DIAGNOSIS — F329 Major depressive disorder, single episode, unspecified: Secondary | ICD-10-CM | POA: Diagnosis not present

## 2016-05-16 DIAGNOSIS — I10 Essential (primary) hypertension: Secondary | ICD-10-CM | POA: Diagnosis not present

## 2016-05-19 DIAGNOSIS — S2242XD Multiple fractures of ribs, left side, subsequent encounter for fracture with routine healing: Secondary | ICD-10-CM | POA: Diagnosis not present

## 2016-05-19 DIAGNOSIS — E1140 Type 2 diabetes mellitus with diabetic neuropathy, unspecified: Secondary | ICD-10-CM | POA: Diagnosis not present

## 2016-05-19 DIAGNOSIS — F329 Major depressive disorder, single episode, unspecified: Secondary | ICD-10-CM | POA: Diagnosis not present

## 2016-05-19 DIAGNOSIS — I10 Essential (primary) hypertension: Secondary | ICD-10-CM | POA: Diagnosis not present

## 2016-05-19 DIAGNOSIS — F419 Anxiety disorder, unspecified: Secondary | ICD-10-CM | POA: Diagnosis not present

## 2016-05-19 DIAGNOSIS — M810 Age-related osteoporosis without current pathological fracture: Secondary | ICD-10-CM | POA: Diagnosis not present

## 2016-05-22 DIAGNOSIS — F419 Anxiety disorder, unspecified: Secondary | ICD-10-CM | POA: Diagnosis not present

## 2016-05-22 DIAGNOSIS — M810 Age-related osteoporosis without current pathological fracture: Secondary | ICD-10-CM | POA: Diagnosis not present

## 2016-05-22 DIAGNOSIS — E1140 Type 2 diabetes mellitus with diabetic neuropathy, unspecified: Secondary | ICD-10-CM | POA: Diagnosis not present

## 2016-05-22 DIAGNOSIS — F329 Major depressive disorder, single episode, unspecified: Secondary | ICD-10-CM | POA: Diagnosis not present

## 2016-05-22 DIAGNOSIS — I10 Essential (primary) hypertension: Secondary | ICD-10-CM | POA: Diagnosis not present

## 2016-05-22 DIAGNOSIS — S2242XD Multiple fractures of ribs, left side, subsequent encounter for fracture with routine healing: Secondary | ICD-10-CM | POA: Diagnosis not present

## 2016-05-29 DIAGNOSIS — F419 Anxiety disorder, unspecified: Secondary | ICD-10-CM | POA: Diagnosis not present

## 2016-05-29 DIAGNOSIS — S2242XD Multiple fractures of ribs, left side, subsequent encounter for fracture with routine healing: Secondary | ICD-10-CM | POA: Diagnosis not present

## 2016-05-29 DIAGNOSIS — I10 Essential (primary) hypertension: Secondary | ICD-10-CM | POA: Diagnosis not present

## 2016-05-29 DIAGNOSIS — E1140 Type 2 diabetes mellitus with diabetic neuropathy, unspecified: Secondary | ICD-10-CM | POA: Diagnosis not present

## 2016-05-29 DIAGNOSIS — F329 Major depressive disorder, single episode, unspecified: Secondary | ICD-10-CM | POA: Diagnosis not present

## 2016-05-29 DIAGNOSIS — M810 Age-related osteoporosis without current pathological fracture: Secondary | ICD-10-CM | POA: Diagnosis not present

## 2016-05-30 DIAGNOSIS — E1140 Type 2 diabetes mellitus with diabetic neuropathy, unspecified: Secondary | ICD-10-CM | POA: Diagnosis not present

## 2016-05-30 DIAGNOSIS — M810 Age-related osteoporosis without current pathological fracture: Secondary | ICD-10-CM | POA: Diagnosis not present

## 2016-05-30 DIAGNOSIS — F329 Major depressive disorder, single episode, unspecified: Secondary | ICD-10-CM | POA: Diagnosis not present

## 2016-05-30 DIAGNOSIS — S2242XD Multiple fractures of ribs, left side, subsequent encounter for fracture with routine healing: Secondary | ICD-10-CM | POA: Diagnosis not present

## 2016-05-30 DIAGNOSIS — I10 Essential (primary) hypertension: Secondary | ICD-10-CM | POA: Diagnosis not present

## 2016-05-30 DIAGNOSIS — F419 Anxiety disorder, unspecified: Secondary | ICD-10-CM | POA: Diagnosis not present

## 2016-06-02 DIAGNOSIS — S2242XD Multiple fractures of ribs, left side, subsequent encounter for fracture with routine healing: Secondary | ICD-10-CM | POA: Diagnosis not present

## 2016-06-02 DIAGNOSIS — M810 Age-related osteoporosis without current pathological fracture: Secondary | ICD-10-CM | POA: Diagnosis not present

## 2016-06-02 DIAGNOSIS — E1140 Type 2 diabetes mellitus with diabetic neuropathy, unspecified: Secondary | ICD-10-CM | POA: Diagnosis not present

## 2016-06-02 DIAGNOSIS — I10 Essential (primary) hypertension: Secondary | ICD-10-CM | POA: Diagnosis not present

## 2016-06-02 DIAGNOSIS — F329 Major depressive disorder, single episode, unspecified: Secondary | ICD-10-CM | POA: Diagnosis not present

## 2016-06-02 DIAGNOSIS — F419 Anxiety disorder, unspecified: Secondary | ICD-10-CM | POA: Diagnosis not present

## 2016-06-03 DIAGNOSIS — E1140 Type 2 diabetes mellitus with diabetic neuropathy, unspecified: Secondary | ICD-10-CM | POA: Diagnosis not present

## 2016-06-03 DIAGNOSIS — E039 Hypothyroidism, unspecified: Secondary | ICD-10-CM | POA: Diagnosis not present

## 2016-06-03 DIAGNOSIS — E782 Mixed hyperlipidemia: Secondary | ICD-10-CM | POA: Diagnosis not present

## 2016-06-03 DIAGNOSIS — I10 Essential (primary) hypertension: Secondary | ICD-10-CM | POA: Diagnosis not present

## 2016-06-03 DIAGNOSIS — E8881 Metabolic syndrome: Secondary | ICD-10-CM | POA: Diagnosis not present

## 2016-06-03 DIAGNOSIS — E1122 Type 2 diabetes mellitus with diabetic chronic kidney disease: Secondary | ICD-10-CM | POA: Diagnosis not present

## 2016-06-03 DIAGNOSIS — G6289 Other specified polyneuropathies: Secondary | ICD-10-CM | POA: Diagnosis not present

## 2016-06-03 DIAGNOSIS — E6609 Other obesity due to excess calories: Secondary | ICD-10-CM | POA: Diagnosis not present

## 2016-06-05 DIAGNOSIS — I10 Essential (primary) hypertension: Secondary | ICD-10-CM | POA: Diagnosis not present

## 2016-06-05 DIAGNOSIS — M810 Age-related osteoporosis without current pathological fracture: Secondary | ICD-10-CM | POA: Diagnosis not present

## 2016-06-05 DIAGNOSIS — F329 Major depressive disorder, single episode, unspecified: Secondary | ICD-10-CM | POA: Diagnosis not present

## 2016-06-05 DIAGNOSIS — F419 Anxiety disorder, unspecified: Secondary | ICD-10-CM | POA: Diagnosis not present

## 2016-06-05 DIAGNOSIS — S2242XD Multiple fractures of ribs, left side, subsequent encounter for fracture with routine healing: Secondary | ICD-10-CM | POA: Diagnosis not present

## 2016-06-05 DIAGNOSIS — E1140 Type 2 diabetes mellitus with diabetic neuropathy, unspecified: Secondary | ICD-10-CM | POA: Diagnosis not present

## 2016-06-08 DIAGNOSIS — F329 Major depressive disorder, single episode, unspecified: Secondary | ICD-10-CM | POA: Diagnosis not present

## 2016-06-08 DIAGNOSIS — F419 Anxiety disorder, unspecified: Secondary | ICD-10-CM | POA: Diagnosis not present

## 2016-06-08 DIAGNOSIS — Z9181 History of falling: Secondary | ICD-10-CM | POA: Diagnosis not present

## 2016-06-08 DIAGNOSIS — E1140 Type 2 diabetes mellitus with diabetic neuropathy, unspecified: Secondary | ICD-10-CM | POA: Diagnosis not present

## 2016-06-08 DIAGNOSIS — M542 Cervicalgia: Secondary | ICD-10-CM | POA: Diagnosis not present

## 2016-06-08 DIAGNOSIS — I10 Essential (primary) hypertension: Secondary | ICD-10-CM | POA: Diagnosis not present

## 2016-06-08 DIAGNOSIS — Z794 Long term (current) use of insulin: Secondary | ICD-10-CM | POA: Diagnosis not present

## 2016-06-08 DIAGNOSIS — M810 Age-related osteoporosis without current pathological fracture: Secondary | ICD-10-CM | POA: Diagnosis not present

## 2016-06-08 DIAGNOSIS — R296 Repeated falls: Secondary | ICD-10-CM | POA: Diagnosis not present

## 2016-06-08 DIAGNOSIS — E669 Obesity, unspecified: Secondary | ICD-10-CM | POA: Diagnosis not present

## 2016-06-11 DIAGNOSIS — M542 Cervicalgia: Secondary | ICD-10-CM | POA: Diagnosis not present

## 2016-06-11 DIAGNOSIS — I10 Essential (primary) hypertension: Secondary | ICD-10-CM | POA: Diagnosis not present

## 2016-06-11 DIAGNOSIS — F329 Major depressive disorder, single episode, unspecified: Secondary | ICD-10-CM | POA: Diagnosis not present

## 2016-06-11 DIAGNOSIS — E1140 Type 2 diabetes mellitus with diabetic neuropathy, unspecified: Secondary | ICD-10-CM | POA: Diagnosis not present

## 2016-06-11 DIAGNOSIS — F419 Anxiety disorder, unspecified: Secondary | ICD-10-CM | POA: Diagnosis not present

## 2016-06-11 DIAGNOSIS — M810 Age-related osteoporosis without current pathological fracture: Secondary | ICD-10-CM | POA: Diagnosis not present

## 2016-06-16 DIAGNOSIS — E1140 Type 2 diabetes mellitus with diabetic neuropathy, unspecified: Secondary | ICD-10-CM | POA: Diagnosis not present

## 2016-06-16 DIAGNOSIS — I10 Essential (primary) hypertension: Secondary | ICD-10-CM | POA: Diagnosis not present

## 2016-06-16 DIAGNOSIS — M542 Cervicalgia: Secondary | ICD-10-CM | POA: Diagnosis not present

## 2016-06-16 DIAGNOSIS — F419 Anxiety disorder, unspecified: Secondary | ICD-10-CM | POA: Diagnosis not present

## 2016-06-16 DIAGNOSIS — F329 Major depressive disorder, single episode, unspecified: Secondary | ICD-10-CM | POA: Diagnosis not present

## 2016-06-16 DIAGNOSIS — M810 Age-related osteoporosis without current pathological fracture: Secondary | ICD-10-CM | POA: Diagnosis not present

## 2016-06-18 DIAGNOSIS — F419 Anxiety disorder, unspecified: Secondary | ICD-10-CM | POA: Diagnosis not present

## 2016-06-18 DIAGNOSIS — F329 Major depressive disorder, single episode, unspecified: Secondary | ICD-10-CM | POA: Diagnosis not present

## 2016-06-18 DIAGNOSIS — E1140 Type 2 diabetes mellitus with diabetic neuropathy, unspecified: Secondary | ICD-10-CM | POA: Diagnosis not present

## 2016-06-18 DIAGNOSIS — M810 Age-related osteoporosis without current pathological fracture: Secondary | ICD-10-CM | POA: Diagnosis not present

## 2016-06-18 DIAGNOSIS — M542 Cervicalgia: Secondary | ICD-10-CM | POA: Diagnosis not present

## 2016-06-18 DIAGNOSIS — I10 Essential (primary) hypertension: Secondary | ICD-10-CM | POA: Diagnosis not present

## 2016-06-23 DIAGNOSIS — I10 Essential (primary) hypertension: Secondary | ICD-10-CM | POA: Diagnosis not present

## 2016-06-23 DIAGNOSIS — M542 Cervicalgia: Secondary | ICD-10-CM | POA: Diagnosis not present

## 2016-06-23 DIAGNOSIS — E1140 Type 2 diabetes mellitus with diabetic neuropathy, unspecified: Secondary | ICD-10-CM | POA: Diagnosis not present

## 2016-06-23 DIAGNOSIS — M810 Age-related osteoporosis without current pathological fracture: Secondary | ICD-10-CM | POA: Diagnosis not present

## 2016-06-23 DIAGNOSIS — F419 Anxiety disorder, unspecified: Secondary | ICD-10-CM | POA: Diagnosis not present

## 2016-06-23 DIAGNOSIS — F329 Major depressive disorder, single episode, unspecified: Secondary | ICD-10-CM | POA: Diagnosis not present

## 2016-06-25 DIAGNOSIS — M810 Age-related osteoporosis without current pathological fracture: Secondary | ICD-10-CM | POA: Diagnosis not present

## 2016-06-25 DIAGNOSIS — M542 Cervicalgia: Secondary | ICD-10-CM | POA: Diagnosis not present

## 2016-06-25 DIAGNOSIS — E1140 Type 2 diabetes mellitus with diabetic neuropathy, unspecified: Secondary | ICD-10-CM | POA: Diagnosis not present

## 2016-06-25 DIAGNOSIS — I10 Essential (primary) hypertension: Secondary | ICD-10-CM | POA: Diagnosis not present

## 2016-06-25 DIAGNOSIS — F419 Anxiety disorder, unspecified: Secondary | ICD-10-CM | POA: Diagnosis not present

## 2016-06-25 DIAGNOSIS — F329 Major depressive disorder, single episode, unspecified: Secondary | ICD-10-CM | POA: Diagnosis not present

## 2016-06-27 ENCOUNTER — Ambulatory Visit: Payer: TRICARE For Life (TFL) | Admitting: Cardiovascular Disease

## 2016-06-30 DIAGNOSIS — F419 Anxiety disorder, unspecified: Secondary | ICD-10-CM | POA: Diagnosis not present

## 2016-06-30 DIAGNOSIS — I10 Essential (primary) hypertension: Secondary | ICD-10-CM | POA: Diagnosis not present

## 2016-06-30 DIAGNOSIS — E1140 Type 2 diabetes mellitus with diabetic neuropathy, unspecified: Secondary | ICD-10-CM | POA: Diagnosis not present

## 2016-06-30 DIAGNOSIS — M810 Age-related osteoporosis without current pathological fracture: Secondary | ICD-10-CM | POA: Diagnosis not present

## 2016-06-30 DIAGNOSIS — F329 Major depressive disorder, single episode, unspecified: Secondary | ICD-10-CM | POA: Diagnosis not present

## 2016-06-30 DIAGNOSIS — M542 Cervicalgia: Secondary | ICD-10-CM | POA: Diagnosis not present

## 2016-07-04 DIAGNOSIS — F329 Major depressive disorder, single episode, unspecified: Secondary | ICD-10-CM | POA: Diagnosis not present

## 2016-07-04 DIAGNOSIS — M810 Age-related osteoporosis without current pathological fracture: Secondary | ICD-10-CM | POA: Diagnosis not present

## 2016-07-04 DIAGNOSIS — I10 Essential (primary) hypertension: Secondary | ICD-10-CM | POA: Diagnosis not present

## 2016-07-04 DIAGNOSIS — F419 Anxiety disorder, unspecified: Secondary | ICD-10-CM | POA: Diagnosis not present

## 2016-07-04 DIAGNOSIS — E1140 Type 2 diabetes mellitus with diabetic neuropathy, unspecified: Secondary | ICD-10-CM | POA: Diagnosis not present

## 2016-07-04 DIAGNOSIS — M542 Cervicalgia: Secondary | ICD-10-CM | POA: Diagnosis not present

## 2016-07-14 ENCOUNTER — Encounter: Payer: Self-pay | Admitting: *Deleted

## 2016-07-14 ENCOUNTER — Encounter: Payer: Self-pay | Admitting: Cardiovascular Disease

## 2016-07-14 ENCOUNTER — Ambulatory Visit (INDEPENDENT_AMBULATORY_CARE_PROVIDER_SITE_OTHER): Payer: Medicare Other | Admitting: Cardiovascular Disease

## 2016-07-14 VITALS — BP 116/70 | HR 105 | Ht 66.0 in | Wt 237.0 lb

## 2016-07-14 DIAGNOSIS — R0609 Other forms of dyspnea: Secondary | ICD-10-CM | POA: Diagnosis not present

## 2016-07-14 DIAGNOSIS — R5383 Other fatigue: Secondary | ICD-10-CM | POA: Diagnosis not present

## 2016-07-14 DIAGNOSIS — I10 Essential (primary) hypertension: Secondary | ICD-10-CM | POA: Diagnosis not present

## 2016-07-14 DIAGNOSIS — R55 Syncope and collapse: Secondary | ICD-10-CM

## 2016-07-14 DIAGNOSIS — E1140 Type 2 diabetes mellitus with diabetic neuropathy, unspecified: Secondary | ICD-10-CM | POA: Diagnosis not present

## 2016-07-14 DIAGNOSIS — E782 Mixed hyperlipidemia: Secondary | ICD-10-CM | POA: Diagnosis not present

## 2016-07-14 DIAGNOSIS — E039 Hypothyroidism, unspecified: Secondary | ICD-10-CM | POA: Diagnosis not present

## 2016-07-14 DIAGNOSIS — E8881 Metabolic syndrome: Secondary | ICD-10-CM | POA: Diagnosis not present

## 2016-07-14 NOTE — Patient Instructions (Signed)
Medication Instructions:  Continue all current medications.  Labwork: None.  Testing/Procedures:  Your physician has requested that you have a lexiscan myoview. For further information please visit [email or web address]. Please follow instruction sheet, as given.  Office will contact with results via phone or letter.    Follow-Up: 6 weeks   Any Other Special Instructions Will Be Listed Below (If Applicable).  If you need a refill on your cardiac medications before your next appointment, please call your pharmacy.

## 2016-07-14 NOTE — Addendum Note (Signed)
Addended by: Laurine Blazer on: 07/14/2016 02:31 PM   Modules accepted: Orders

## 2016-07-14 NOTE — Progress Notes (Signed)
CARDIOLOGY CONSULT NOTE  Patient ID: Kathryn Ryan MRN: [id number] DOB/AGE: [birth year] 75 y.o.  Admit date: (Not on file) Primary Physician: Gar Ponto, MD Referring Physician:   Reason for Consultation: syncope  HPI: The patient is a 75 year old woman with hypertension and diabetes as well as hyperlipidemia. She is referred for the evaluation of syncope. She reportedly fell down several steps earlier this year and she thinks she passed out. There was no reported antecedent chest pain or shortness of breath. She did not go to the ER.  She said she was standing at the top of the stairs and the next thing she knew she was at the bottom of the stairs. She denies lightheadedness and dizziness. She has a long history of falls and also has diabetic neuropathy. She denies exertional chest pain. She denies having passed out before. She has exertional dyspnea when walking uphill. She has also had diminished energy levels and fatigue over the past year.  Her father died of a massive heart attack at age 14.    Allergies  Allergen Reactions  . Statins Other (See Comments)    Joint pain   . Sulfa Antibiotics Itching    Current Outpatient Prescriptions  Medication Sig Dispense Refill  . ceFAZolin (ANCEF) 1-5 GM-% Inject 50 mLs (1 g total) into the vein every 8 (eight) hours. Provide 5 week supply 50 mL 5  . Docusate Sodium (COLACE PO) Take 3 capsules by mouth at bedtime.     . DULoxetine (CYMBALTA) 60 MG capsule Take 60 mg by mouth 2 (two) times daily.    Marland Kitchen esomeprazole (NEXIUM) 40 MG capsule Take 40 mg by mouth at bedtime.     Marland Kitchen glucose monitoring kit (FREESTYLE) monitoring kit 1 each by Does not apply route 4 (four) times daily - after meals and at bedtime. 1 month Diabetic Testing Supplies for QAC-QHS accuchecks.Any brand OK 1 each 1  . HYDROcodone-acetaminophen (VICODIN) 5-500 MG per tablet Take 2 tablets by mouth at bedtime. For pain 30 tablet 0  . insulin aspart (NOVOLOG)  100 UNIT/ML injection Before each meal 3 times a day, 140-199 - 2 units, 200-250 - 4 units, 251-299 - 6 units,  300-349 - 8 units,  350 or above 10 units. Insulin PEN if approved, provide syringes and needles if needed. 10 mL 11  . insulin glargine (LANTUS) 100 UNIT/ML injection Inject 0.24 mLs (24 Units total) into the skin daily. Provide pen if qualifies or insulin needles and syringes- 1 month supply 10 mL 11  . levothyroxine (SYNTHROID, LEVOTHROID) 100 MCG tablet Take 100 mcg by mouth daily before breakfast.    . lisinopril-hydrochlorothiazide (PRINZIDE,ZESTORETIC) 20-25 MG per tablet Take 1 tablet by mouth daily.    . Multiple Vitamin (MULITIVITAMIN WITH MINERALS) TABS Take 1 tablet by mouth daily.    . polyethylene glycol (MIRALAX / GLYCOLAX) packet Take 17 g by mouth 2 (two) times daily. 14 each 0  . pregabalin (LYRICA) 150 MG capsule Take 150 mg by mouth 2 (two) times daily.     No current facility-administered medications for this visit.     Past Medical History:  Diagnosis Date  . Anxiety   . Diabetes mellitus without complication    new onset diabetic   . GERD (gastroesophageal reflux disease)   . H/O hiatal hernia   . Hypertension   . Hypothyroidism   . Lumbar radiculopathy   . Neuropathy    non related to DM-not sure why  .  Neuropathy    non DM related  . Spinal stenosis of lumbar region     Past Surgical History:  Procedure Laterality Date  . ABDOMINAL HYSTERECTOMY    . BACK SURGERY    . BREAST SURGERY     bilateral lumpectomies  . BUNIONECTOMY     bilateral  . CHOLECYSTECTOMY    . HARDWARE REMOVAL Left 04/26/2014   Procedure: HARDWARE REMOVAL LEFT ANKLE;  Surgeon: Wylene Simmer, MD;  Location: [facility];  Service: Orthopedics;  Laterality: Left;  . I&D EXTREMITY Left 04/26/2014   Procedure: IRRIGATION AND DEBRIDEMENT LEFT ANKLE;  Surgeon: Wylene Simmer, MD;  [facility];  Service: Orthopedics;  Laterality: Left;  . IRRIGATION AND DEBRIDEMENT ABSCESS Left 04/26/2014    . LEFT OOPHORECTOMY    . PERIPHERALLY INSERTED CENTRAL CATHETER INSERTION  04/26/2014   rt upper arm  . TONSILLECTOMY     6 yrs    Social History   Social History  . Marital status: Widowed    Spouse name: N/A  . Number of children: N/A  . Years of education: N/A   Occupational History  . Not on file.   Social History Main Topics  . Smoking status: Former Smoker    Packs/day: 2.00    Years: 25.00    Types: Cigarettes    Quit date: 02/23/1995  . Smokeless tobacco: Never Used  . Alcohol use No  . Drug use: No  . Sexual activity: Not on file   Other Topics Concern  . Not on file   Social History Narrative  . No narrative on file     No family history of premature CAD in 1st degree relatives.  Prior to Admission medications   Medication Sig Start Date End Date Taking? Authorizing Provider  ceFAZolin (ANCEF) 1-5 GM-% Inject 50 mLs (1 g total) into the vein every 8 (eight) hours. Provide 5 week supply 05/02/14   Thurnell Lose, MD  Docusate Sodium (COLACE PO) Take 3 capsules by mouth at bedtime.     Historical Provider, MD  DULoxetine (CYMBALTA) 60 MG capsule Take 60 mg by mouth 2 (two) times daily.    Historical Provider, MD  esomeprazole (NEXIUM) 40 MG capsule Take 40 mg by mouth at bedtime.     Historical Provider, MD  glucose monitoring kit (FREESTYLE) monitoring kit 1 each by Does not apply route 4 (four) times daily - after meals and at bedtime. 1 month Diabetic Testing Supplies for QAC-QHS accuchecks.Any brand OK 05/02/14   Thurnell Lose, MD  HYDROcodone-acetaminophen (VICODIN) 5-500 MG per tablet Take 2 tablets by mouth at bedtime. For pain 05/02/14   Thurnell Lose, MD  insulin aspart (NOVOLOG) 100 UNIT/ML injection Before each meal 3 times a day, 140-199 - 2 units, 200-250 - 4 units, 251-299 - 6 units,  300-349 - 8 units,  350 or above 10 units. Insulin PEN if approved, provide syringes and needles if needed. 05/02/14   Thurnell Lose, MD  insulin glargine  (LANTUS) 100 UNIT/ML injection Inject 0.24 mLs (24 Units total) into the skin daily. Provide pen if qualifies or insulin needles and syringes- 1 month supply 05/02/14   Thurnell Lose, MD  levothyroxine (SYNTHROID, LEVOTHROID) 100 MCG tablet Take 100 mcg by mouth daily before breakfast.    Historical Provider, MD  lisinopril-hydrochlorothiazide (PRINZIDE,ZESTORETIC) 20-25 MG per tablet Take 1 tablet by mouth daily.    Historical Provider, MD  Multiple Vitamin (MULITIVITAMIN WITH MINERALS) TABS Take 1 tablet by mouth  daily.    Historical Provider, MD  polyethylene glycol (MIRALAX / GLYCOLAX) packet Take 17 g by mouth 2 (two) times daily. 05/02/14   Thurnell Lose, MD  pregabalin (LYRICA) 150 MG capsule Take 150 mg by mouth 2 (two) times daily.    Historical Provider, MD     Review of systems complete and found to be negative unless listed above in HPI     Physical exam There were no vitals taken for this visit. General: NAD Neck: No JVD, no thyromegaly or thyroid nodule.  Lungs: Clear to auscultation bilaterally with normal respiratory effort. CV: Nondisplaced PMI. Tachycardic, regular rhythm, normal S1/S2, no S3/S4, no murmur.  No peripheral edema.  No carotid bruit.  Normal pedal pulses.  Abdomen: Soft, obese, no distention.   Neurologic: Alert and oriented x 3.  Psych: Normal affect. Extremities: No clubbing or cyanosis.  HEENT: Normal.     Labs:   Lab Results  Component Value Date   WBC 7.7 04/28/2014   HGB 10.3 (L) 04/28/2014   HCT 32.2 (L) 04/28/2014   MCV 94.2 04/28/2014   PLT 378 04/28/2014   No results for input(s): NA, K, CL, CO2, BUN, CREATININE, CALCIUM, PROT, BILITOT, ALKPHOS, ALT, AST, GLUCOSE in the last 168 hours.  Invalid input(s): LABALBU No results found for: CKTOTAL, CKMB, CKMBINDEX, TROPONINI No results found for: CHOL No results found for: HDL No results found for: LDLCALC No results found for: TRIG No results found for: CHOLHDL No results found  for: LDLDIRECT       Studies: No results found.  ASSESSMENT AND PLAN:  1. Syncope/fatigue/DOE: I will proceed with a nuclear myocardial perfusion imaging study to evaluate for ischemic heart disease (Lexiscan). Will obtain ECG today. If she has recurrent syncope I would then consider event monitoring but not at this time.  2. Essential HTN: Controlled. No changes.  Dispo: fu 6 weeks.    Signed: Kate Sable, M.D., F.A.C.C.  07/14/2016, 1:56 PM

## 2016-07-16 DIAGNOSIS — G6289 Other specified polyneuropathies: Secondary | ICD-10-CM | POA: Diagnosis not present

## 2016-07-16 DIAGNOSIS — E782 Mixed hyperlipidemia: Secondary | ICD-10-CM | POA: Diagnosis not present

## 2016-07-16 DIAGNOSIS — E8881 Metabolic syndrome: Secondary | ICD-10-CM | POA: Diagnosis not present

## 2016-07-16 DIAGNOSIS — Z6839 Body mass index (BMI) 39.0-39.9, adult: Secondary | ICD-10-CM | POA: Diagnosis not present

## 2016-07-16 DIAGNOSIS — E6609 Other obesity due to excess calories: Secondary | ICD-10-CM | POA: Diagnosis not present

## 2016-07-16 DIAGNOSIS — E039 Hypothyroidism, unspecified: Secondary | ICD-10-CM | POA: Diagnosis not present

## 2016-07-16 DIAGNOSIS — E1140 Type 2 diabetes mellitus with diabetic neuropathy, unspecified: Secondary | ICD-10-CM | POA: Diagnosis not present

## 2016-07-16 DIAGNOSIS — E1122 Type 2 diabetes mellitus with diabetic chronic kidney disease: Secondary | ICD-10-CM | POA: Diagnosis not present

## 2016-07-21 ENCOUNTER — Encounter (HOSPITAL_COMMUNITY)
Admission: RE | Admit: 2016-07-21 | Discharge: 2016-07-21 | Disposition: A | Discharge status: Home / Self Care | Payer: Medicare Other | Source: Ambulatory Visit | Attending: Cardiovascular Disease | Admitting: Cardiovascular Disease

## 2016-07-21 ENCOUNTER — Inpatient Hospital Stay (HOSPITAL_COMMUNITY): Admission: RE | Admit: 2016-07-21 | Payer: TRICARE For Life (TFL) | Source: Ambulatory Visit

## 2016-07-21 ENCOUNTER — Encounter (HOSPITAL_COMMUNITY): Payer: Self-pay

## 2016-07-21 DIAGNOSIS — R55 Syncope and collapse: Secondary | ICD-10-CM | POA: Diagnosis not present

## 2016-07-21 DIAGNOSIS — R0609 Other forms of dyspnea: Secondary | ICD-10-CM | POA: Diagnosis not present

## 2016-07-21 LAB — NM MYOCAR MULTI W/SPECT W/WALL MOTION / EF
CHL CUP NUCLEAR SRS: 1
LV dias vol: 63 mL (ref 46–106)
LV sys vol: 14 mL
Peak HR: 112 {beats}/min
RATE: 0.35
Rest HR: 100 {beats}/min
SDS: 0
SSS: 1
TID: 0.94

## 2016-07-21 MED ORDER — TECHNETIUM TC 99M TETROFOSMIN IV KIT
10.0000 | PACK | Freq: Once | INTRAVENOUS | Status: AC | PRN
  Administered 2016-07-21: 10 via INTRAVENOUS

## 2016-07-21 MED ORDER — TECHNETIUM TC 99M TETROFOSMIN IV KIT
30.0000 | PACK | Freq: Once | INTRAVENOUS | Status: AC | PRN
  Administered 2016-07-21: 30 via INTRAVENOUS

## 2016-07-21 MED ORDER — SODIUM CHLORIDE 0.9% FLUSH
INTRAVENOUS | Status: AC
  Administered 2016-07-21: 10 mL via INTRAVENOUS
  Filled 2016-07-21: qty 10

## 2016-07-21 MED ORDER — REGADENOSON 0.4 MG/5ML IV SOLN
INTRAVENOUS | Status: AC
  Administered 2016-07-21: 0.4 mg via INTRAVENOUS
  Filled 2016-07-21: qty 5

## 2016-07-24 ENCOUNTER — Telehealth: Payer: Self-pay | Admitting: *Deleted

## 2016-07-24 NOTE — Telephone Encounter (Signed)
Notes Recorded by Laurine Blazer, LPN on [id number] at 075-GRM AM EDT Patient notified. Copy to pmd. 6 week follow up scheduled for October. ------  Notes Recorded by Arnoldo Lenis, MD on 07/22/2016 at 1:10 PM EDT Stress test looks good, Dr Raliegh Ip to review in detail at there follow up. No evidence of blocakges

## 2016-09-09 ENCOUNTER — Ambulatory Visit (INDEPENDENT_AMBULATORY_CARE_PROVIDER_SITE_OTHER): Payer: Medicare Other | Admitting: Cardiovascular Disease

## 2016-09-09 ENCOUNTER — Encounter: Payer: Self-pay | Admitting: Cardiovascular Disease

## 2016-09-09 VITALS — BP 122/78 | HR 105 | Ht 66.0 in | Wt 238.0 lb

## 2016-09-09 DIAGNOSIS — M79605 Pain in left leg: Secondary | ICD-10-CM

## 2016-09-09 DIAGNOSIS — I10 Essential (primary) hypertension: Secondary | ICD-10-CM

## 2016-09-09 DIAGNOSIS — M79604 Pain in right leg: Secondary | ICD-10-CM | POA: Diagnosis not present

## 2016-09-09 DIAGNOSIS — R55 Syncope and collapse: Secondary | ICD-10-CM | POA: Diagnosis not present

## 2016-09-09 NOTE — Progress Notes (Signed)
SUBJECTIVE: The patient returns for follow-up after undergoing cardiovascular testing performed for the evaluation of syncope. Nuclear stress test 07/21/16 was normal.  ECG 07/14/16 NSR, HR 99 bpm.  No further episodes of syncope. Denies chest pain, palpitations, leg swelling, and shortness of breath. Does complain of bilateral leg pain when walking. She has diabetes. She smokes several years ago.   Review of Systems: As per "subjective", otherwise negative.  Allergies  Allergen Reactions  . Statins Other (See Comments)    Joint pain   . Sulfa Antibiotics Itching    Current Outpatient Prescriptions  Medication Sig Dispense Refill  . clonazePAM (KLONOPIN) 1 MG tablet Take 1 tablet by mouth 3 (three) times daily as needed.    Mariane Baumgarten Sodium (COLACE PO) Take 3 capsules by mouth daily as needed.     . DULoxetine (CYMBALTA) 60 MG capsule Take 60 mg by mouth 2 (two) times daily.    Marland Kitchen esomeprazole (NEXIUM) 40 MG capsule Take 40 mg by mouth at bedtime.     . insulin glargine (LANTUS) 100 UNIT/ML injection Inject 0.24 mLs (24 Units total) into the skin daily. Provide pen if qualifies or insulin needles and syringes- 1 month supply (Patient taking differently: Inject 120 Units into the skin daily. Provide pen if qualifies or insulin needles and syringes- 1 month supply) 10 mL 11  . levothyroxine (SYNTHROID, LEVOTHROID) 125 MCG tablet Take 1 tablet by mouth daily.    Marland Kitchen losartan-hydrochlorothiazide (HYZAAR) 100-12.5 MG tablet Take 1 tablet by mouth daily.    . metFORMIN (GLUCOPHAGE-XR) 500 MG 24 hr tablet Take 500 mg by mouth daily.    . pregabalin (LYRICA) 150 MG capsule Take 150 mg by mouth 3 (three) times daily.     . traMADol (ULTRAM) 50 MG tablet Take 2 tablets by mouth 4 (four) times daily.     No current facility-administered medications for this visit.     Past Medical History:  Diagnosis Date  . Anxiety   . Diabetes mellitus without complication ([place])    new onset diabetic    . GERD (gastroesophageal reflux disease)   . H/O hiatal hernia   . Hypertension   . Hypothyroidism   . Lumbar radiculopathy   . Neuropathy ([place])    non related to DM-not sure why  . Neuropathy ([place])    non DM related  . Spinal stenosis of lumbar region     Past Surgical History:  Procedure Laterality Date  . ABDOMINAL HYSTERECTOMY    . BACK SURGERY    . BREAST SURGERY     bilateral lumpectomies  . BUNIONECTOMY     bilateral  . CHOLECYSTECTOMY    . HARDWARE REMOVAL Left 04/26/2014   Procedure: HARDWARE REMOVAL LEFT ANKLE;  Surgeon: Wylene Simmer, MD;  Location: [facility];  Service: Orthopedics;  Laterality: Left;  . I&D EXTREMITY Left 04/26/2014   Procedure: IRRIGATION AND DEBRIDEMENT LEFT ANKLE;  Surgeon: Wylene Simmer, MD;  Location: [facility];  Service: Orthopedics;  Laterality: Left;  . IRRIGATION AND DEBRIDEMENT ABSCESS Left 04/26/2014  . LEFT OOPHORECTOMY    . PERIPHERALLY INSERTED CENTRAL CATHETER INSERTION  04/26/2014   rt upper arm  . TONSILLECTOMY     6 yrs    Social History   Social History  . Marital status: Widowed    Spouse name: N/A  . Number of children: N/A  . Years of education: N/A   Occupational History  . Not on file.   Social History Main  Topics  . Smoking status: Former Smoker    Packs/day: 2.00    Years: 25.00    Types: Cigarettes    Start date: 12/17/1968    Quit date: 02/23/1995  . Smokeless tobacco: Never Used  . Alcohol use No  . Drug use: No  . Sexual activity: Not on file   Other Topics Concern  . Not on file   Social History Narrative  . No narrative on file     Vitals:   09/09/16 1316  BP: 122/78  Pulse: (!) 105  SpO2: 96%  Weight: 238 lb (108 kg)  Height: 5\' 6"  (1.676 m)    PHYSICAL EXAM General: NAD HEENT: Normal. Neck: No JVD, no thyromegaly. Lungs: Clear to auscultation bilaterally with normal respiratory effort. CV: Nondisplaced PMI.  Tachycardic, regular rhythm, normal S1/S2, no S3/S4, no murmur. No pretibial or  periankle edema.     Abdomen: Obese.  Neurologic: Alert and oriented.  Psych: Normal affect. Skin: Normal. Musculoskeletal: No gross deformities.    ECG: Most recent ECG reviewed.      ASSESSMENT AND PLAN: 1. Syncope: Nuclear stress test 07/21/16 was normal. If she has recurrent syncope I would then consider event monitoring but not at this time.  2. Essential HTN: Controlled. No changes.  3. Bilateral leg pain: Does complain of bilateral leg pain when walking. She has diabetes. She smokes several years ago. Will obtain ABI's.   Dispo: fu 3 months   Kate Sable, M.D., F.A.C.C.

## 2016-09-09 NOTE — Addendum Note (Signed)
Addended by: Laurine Blazer on: 09/09/2016 01:36 PM   Modules accepted: Orders

## 2016-09-09 NOTE — Patient Instructions (Signed)
Medication Instructions:  Continue all current medications.  Labwork: none  Testing/Procedures:  Your physician has requested that you have an ankle brachial index (ABI). During this test an ultrasound and blood pressure cuff are used to evaluate the arteries that supply the arms and legs with blood. Allow thirty minutes for this exam. There are no restrictions or special instructions.  Office will contact with results via phone or letter.    Follow-Up: 3 months   Any Other Special Instructions Will Be Listed Below (If Applicable).  If you need a refill on your cardiac medications before your next appointment, please call your pharmacy.  

## 2016-09-15 ENCOUNTER — Other Ambulatory Visit: Payer: Self-pay | Admitting: Cardiovascular Disease

## 2016-09-15 DIAGNOSIS — I739 Peripheral vascular disease, unspecified: Secondary | ICD-10-CM

## 2016-09-24 DIAGNOSIS — M25561 Pain in right knee: Secondary | ICD-10-CM | POA: Diagnosis not present

## 2016-09-24 DIAGNOSIS — M25511 Pain in right shoulder: Secondary | ICD-10-CM | POA: Diagnosis not present

## 2016-09-24 DIAGNOSIS — M25562 Pain in left knee: Secondary | ICD-10-CM | POA: Diagnosis not present

## 2016-09-24 DIAGNOSIS — M1712 Unilateral primary osteoarthritis, left knee: Secondary | ICD-10-CM | POA: Diagnosis not present

## 2016-09-24 DIAGNOSIS — M1711 Unilateral primary osteoarthritis, right knee: Secondary | ICD-10-CM | POA: Diagnosis not present

## 2016-10-17 DIAGNOSIS — M810 Age-related osteoporosis without current pathological fracture: Secondary | ICD-10-CM | POA: Diagnosis not present

## 2016-10-17 DIAGNOSIS — I10 Essential (primary) hypertension: Secondary | ICD-10-CM | POA: Diagnosis not present

## 2016-10-17 DIAGNOSIS — E1122 Type 2 diabetes mellitus with diabetic chronic kidney disease: Secondary | ICD-10-CM | POA: Diagnosis not present

## 2016-10-17 DIAGNOSIS — E039 Hypothyroidism, unspecified: Secondary | ICD-10-CM | POA: Diagnosis not present

## 2016-10-17 DIAGNOSIS — E1140 Type 2 diabetes mellitus with diabetic neuropathy, unspecified: Secondary | ICD-10-CM | POA: Diagnosis not present

## 2016-10-17 DIAGNOSIS — K210 Gastro-esophageal reflux disease with esophagitis: Secondary | ICD-10-CM | POA: Diagnosis not present

## 2016-10-17 DIAGNOSIS — E8881 Metabolic syndrome: Secondary | ICD-10-CM | POA: Diagnosis not present

## 2016-10-17 DIAGNOSIS — E782 Mixed hyperlipidemia: Secondary | ICD-10-CM | POA: Diagnosis not present

## 2016-10-21 DIAGNOSIS — K7581 Nonalcoholic steatohepatitis (NASH): Secondary | ICD-10-CM | POA: Diagnosis not present

## 2016-10-21 DIAGNOSIS — K219 Gastro-esophageal reflux disease without esophagitis: Secondary | ICD-10-CM | POA: Diagnosis not present

## 2016-10-21 DIAGNOSIS — M810 Age-related osteoporosis without current pathological fracture: Secondary | ICD-10-CM | POA: Diagnosis not present

## 2016-10-21 DIAGNOSIS — I10 Essential (primary) hypertension: Secondary | ICD-10-CM | POA: Diagnosis not present

## 2016-10-21 DIAGNOSIS — Z6841 Body Mass Index (BMI) 40.0 and over, adult: Secondary | ICD-10-CM | POA: Diagnosis not present

## 2016-10-21 DIAGNOSIS — E1140 Type 2 diabetes mellitus with diabetic neuropathy, unspecified: Secondary | ICD-10-CM | POA: Diagnosis not present

## 2016-10-21 DIAGNOSIS — E8881 Metabolic syndrome: Secondary | ICD-10-CM | POA: Diagnosis not present

## 2016-10-21 DIAGNOSIS — E782 Mixed hyperlipidemia: Secondary | ICD-10-CM | POA: Diagnosis not present

## 2016-10-21 DIAGNOSIS — E1122 Type 2 diabetes mellitus with diabetic chronic kidney disease: Secondary | ICD-10-CM | POA: Diagnosis not present

## 2016-10-21 DIAGNOSIS — E6609 Other obesity due to excess calories: Secondary | ICD-10-CM | POA: Diagnosis not present

## 2016-10-21 DIAGNOSIS — E039 Hypothyroidism, unspecified: Secondary | ICD-10-CM | POA: Diagnosis not present

## 2016-10-21 DIAGNOSIS — N183 Chronic kidney disease, stage 3 (moderate): Secondary | ICD-10-CM | POA: Diagnosis not present

## 2016-10-21 DIAGNOSIS — Z23 Encounter for immunization: Secondary | ICD-10-CM | POA: Diagnosis not present

## 2016-10-21 DIAGNOSIS — G6289 Other specified polyneuropathies: Secondary | ICD-10-CM | POA: Diagnosis not present

## 2016-10-28 DIAGNOSIS — M79673 Pain in unspecified foot: Secondary | ICD-10-CM | POA: Diagnosis not present

## 2016-10-28 DIAGNOSIS — L851 Acquired keratosis [keratoderma] palmaris et plantaris: Secondary | ICD-10-CM | POA: Diagnosis not present

## 2016-10-28 DIAGNOSIS — L97509 Non-pressure chronic ulcer of other part of unspecified foot with unspecified severity: Secondary | ICD-10-CM | POA: Diagnosis not present

## 2016-10-28 DIAGNOSIS — E1140 Type 2 diabetes mellitus with diabetic neuropathy, unspecified: Secondary | ICD-10-CM | POA: Diagnosis not present

## 2016-11-04 DIAGNOSIS — M79673 Pain in unspecified foot: Secondary | ICD-10-CM | POA: Diagnosis not present

## 2016-11-04 DIAGNOSIS — E1140 Type 2 diabetes mellitus with diabetic neuropathy, unspecified: Secondary | ICD-10-CM | POA: Diagnosis not present

## 2016-11-04 DIAGNOSIS — L97509 Non-pressure chronic ulcer of other part of unspecified foot with unspecified severity: Secondary | ICD-10-CM | POA: Diagnosis not present

## 2016-11-11 DIAGNOSIS — L851 Acquired keratosis [keratoderma] palmaris et plantaris: Secondary | ICD-10-CM | POA: Diagnosis not present

## 2016-11-11 DIAGNOSIS — M79673 Pain in unspecified foot: Secondary | ICD-10-CM | POA: Diagnosis not present

## 2016-11-11 DIAGNOSIS — E1140 Type 2 diabetes mellitus with diabetic neuropathy, unspecified: Secondary | ICD-10-CM | POA: Diagnosis not present

## 2016-11-11 DIAGNOSIS — B351 Tinea unguium: Secondary | ICD-10-CM | POA: Diagnosis not present

## 2016-11-25 DIAGNOSIS — L97509 Non-pressure chronic ulcer of other part of unspecified foot with unspecified severity: Secondary | ICD-10-CM | POA: Diagnosis not present

## 2016-12-10 ENCOUNTER — Ambulatory Visit: Payer: TRICARE For Life (TFL) | Admitting: Cardiovascular Disease

## 2017-01-06 DIAGNOSIS — M170 Bilateral primary osteoarthritis of knee: Secondary | ICD-10-CM | POA: Diagnosis not present

## 2017-01-06 DIAGNOSIS — M25561 Pain in right knee: Secondary | ICD-10-CM | POA: Diagnosis not present

## 2017-01-06 DIAGNOSIS — M25511 Pain in right shoulder: Secondary | ICD-10-CM | POA: Diagnosis not present

## 2017-01-06 DIAGNOSIS — M25562 Pain in left knee: Secondary | ICD-10-CM | POA: Diagnosis not present

## 2017-01-15 DIAGNOSIS — B351 Tinea unguium: Secondary | ICD-10-CM | POA: Diagnosis not present

## 2017-01-15 DIAGNOSIS — M79673 Pain in unspecified foot: Secondary | ICD-10-CM | POA: Diagnosis not present

## 2017-01-15 DIAGNOSIS — L851 Acquired keratosis [keratoderma] palmaris et plantaris: Secondary | ICD-10-CM | POA: Diagnosis not present

## 2017-01-16 DIAGNOSIS — I10 Essential (primary) hypertension: Secondary | ICD-10-CM | POA: Diagnosis not present

## 2017-01-16 DIAGNOSIS — K210 Gastro-esophageal reflux disease with esophagitis: Secondary | ICD-10-CM | POA: Diagnosis not present

## 2017-01-16 DIAGNOSIS — E1122 Type 2 diabetes mellitus with diabetic chronic kidney disease: Secondary | ICD-10-CM | POA: Diagnosis not present

## 2017-01-16 DIAGNOSIS — E039 Hypothyroidism, unspecified: Secondary | ICD-10-CM | POA: Diagnosis not present

## 2017-01-16 DIAGNOSIS — E1140 Type 2 diabetes mellitus with diabetic neuropathy, unspecified: Secondary | ICD-10-CM | POA: Diagnosis not present

## 2017-01-16 DIAGNOSIS — E8881 Metabolic syndrome: Secondary | ICD-10-CM | POA: Diagnosis not present

## 2017-01-16 DIAGNOSIS — Z9189 Other specified personal risk factors, not elsewhere classified: Secondary | ICD-10-CM | POA: Diagnosis not present

## 2017-01-16 DIAGNOSIS — N183 Chronic kidney disease, stage 3 (moderate): Secondary | ICD-10-CM | POA: Diagnosis not present

## 2017-01-16 DIAGNOSIS — E782 Mixed hyperlipidemia: Secondary | ICD-10-CM | POA: Diagnosis not present

## 2017-01-21 DIAGNOSIS — E039 Hypothyroidism, unspecified: Secondary | ICD-10-CM | POA: Diagnosis not present

## 2017-01-21 DIAGNOSIS — Z1389 Encounter for screening for other disorder: Secondary | ICD-10-CM | POA: Diagnosis not present

## 2017-01-21 DIAGNOSIS — E782 Mixed hyperlipidemia: Secondary | ICD-10-CM | POA: Diagnosis not present

## 2017-01-21 DIAGNOSIS — E6609 Other obesity due to excess calories: Secondary | ICD-10-CM | POA: Diagnosis not present

## 2017-01-21 DIAGNOSIS — M810 Age-related osteoporosis without current pathological fracture: Secondary | ICD-10-CM | POA: Diagnosis not present

## 2017-01-21 DIAGNOSIS — E1140 Type 2 diabetes mellitus with diabetic neuropathy, unspecified: Secondary | ICD-10-CM | POA: Diagnosis not present

## 2017-01-21 DIAGNOSIS — E1122 Type 2 diabetes mellitus with diabetic chronic kidney disease: Secondary | ICD-10-CM | POA: Diagnosis not present

## 2017-01-21 DIAGNOSIS — E8881 Metabolic syndrome: Secondary | ICD-10-CM | POA: Diagnosis not present

## 2017-03-03 DIAGNOSIS — R69 Illness, unspecified: Secondary | ICD-10-CM | POA: Diagnosis not present

## 2017-03-23 DIAGNOSIS — E611 Iron deficiency: Secondary | ICD-10-CM | POA: Diagnosis not present

## 2017-03-23 DIAGNOSIS — G2581 Restless legs syndrome: Secondary | ICD-10-CM | POA: Diagnosis not present

## 2017-03-23 DIAGNOSIS — Z6841 Body Mass Index (BMI) 40.0 and over, adult: Secondary | ICD-10-CM | POA: Diagnosis not present

## 2017-03-23 DIAGNOSIS — D519 Vitamin B12 deficiency anemia, unspecified: Secondary | ICD-10-CM | POA: Diagnosis not present

## 2017-03-23 DIAGNOSIS — M7551 Bursitis of right shoulder: Secondary | ICD-10-CM | POA: Diagnosis not present

## 2017-03-23 DIAGNOSIS — D529 Folate deficiency anemia, unspecified: Secondary | ICD-10-CM | POA: Diagnosis not present

## 2017-03-23 DIAGNOSIS — D509 Iron deficiency anemia, unspecified: Secondary | ICD-10-CM | POA: Diagnosis not present

## 2017-03-30 DIAGNOSIS — L851 Acquired keratosis [keratoderma] palmaris et plantaris: Secondary | ICD-10-CM | POA: Diagnosis not present

## 2017-03-30 DIAGNOSIS — M79673 Pain in unspecified foot: Secondary | ICD-10-CM | POA: Diagnosis not present

## 2017-03-30 DIAGNOSIS — B351 Tinea unguium: Secondary | ICD-10-CM | POA: Diagnosis not present

## 2017-03-30 DIAGNOSIS — E1140 Type 2 diabetes mellitus with diabetic neuropathy, unspecified: Secondary | ICD-10-CM | POA: Diagnosis not present

## 2017-04-27 DIAGNOSIS — M7541 Impingement syndrome of right shoulder: Secondary | ICD-10-CM | POA: Diagnosis not present

## 2017-04-27 DIAGNOSIS — M170 Bilateral primary osteoarthritis of knee: Secondary | ICD-10-CM | POA: Diagnosis not present

## 2017-04-27 DIAGNOSIS — M25561 Pain in right knee: Secondary | ICD-10-CM | POA: Diagnosis not present

## 2017-04-27 DIAGNOSIS — M1711 Unilateral primary osteoarthritis, right knee: Secondary | ICD-10-CM | POA: Diagnosis not present

## 2017-05-05 DIAGNOSIS — R609 Edema, unspecified: Secondary | ICD-10-CM | POA: Diagnosis not present

## 2017-05-05 DIAGNOSIS — S83281A Other tear of lateral meniscus, current injury, right knee, initial encounter: Secondary | ICD-10-CM | POA: Diagnosis not present

## 2017-05-05 DIAGNOSIS — M75101 Unspecified rotator cuff tear or rupture of right shoulder, not specified as traumatic: Secondary | ICD-10-CM | POA: Diagnosis not present

## 2017-05-05 DIAGNOSIS — M1711 Unilateral primary osteoarthritis, right knee: Secondary | ICD-10-CM | POA: Diagnosis not present

## 2017-05-05 DIAGNOSIS — M19011 Primary osteoarthritis, right shoulder: Secondary | ICD-10-CM | POA: Diagnosis not present

## 2017-05-05 DIAGNOSIS — S83231A Complex tear of medial meniscus, current injury, right knee, initial encounter: Secondary | ICD-10-CM | POA: Diagnosis not present

## 2017-05-07 DIAGNOSIS — M79672 Pain in left foot: Secondary | ICD-10-CM | POA: Diagnosis not present

## 2017-05-07 DIAGNOSIS — S99929A Unspecified injury of unspecified foot, initial encounter: Secondary | ICD-10-CM | POA: Diagnosis not present

## 2017-05-07 DIAGNOSIS — M205X2 Other deformities of toe(s) (acquired), left foot: Secondary | ICD-10-CM | POA: Diagnosis not present

## 2017-05-11 DIAGNOSIS — I10 Essential (primary) hypertension: Secondary | ICD-10-CM | POA: Diagnosis not present

## 2017-05-11 DIAGNOSIS — D649 Anemia, unspecified: Secondary | ICD-10-CM | POA: Diagnosis not present

## 2017-05-11 DIAGNOSIS — E039 Hypothyroidism, unspecified: Secondary | ICD-10-CM | POA: Diagnosis not present

## 2017-05-11 DIAGNOSIS — D519 Vitamin B12 deficiency anemia, unspecified: Secondary | ICD-10-CM | POA: Diagnosis not present

## 2017-05-11 DIAGNOSIS — E119 Type 2 diabetes mellitus without complications: Secondary | ICD-10-CM | POA: Diagnosis not present

## 2017-05-11 DIAGNOSIS — D529 Folate deficiency anemia, unspecified: Secondary | ICD-10-CM | POA: Diagnosis not present

## 2017-05-11 DIAGNOSIS — E782 Mixed hyperlipidemia: Secondary | ICD-10-CM | POA: Diagnosis not present

## 2017-05-13 DIAGNOSIS — M75121 Complete rotator cuff tear or rupture of right shoulder, not specified as traumatic: Secondary | ICD-10-CM | POA: Diagnosis not present

## 2017-05-14 DIAGNOSIS — Z6839 Body mass index (BMI) 39.0-39.9, adult: Secondary | ICD-10-CM | POA: Diagnosis not present

## 2017-05-14 DIAGNOSIS — E8881 Metabolic syndrome: Secondary | ICD-10-CM | POA: Diagnosis not present

## 2017-05-14 DIAGNOSIS — E782 Mixed hyperlipidemia: Secondary | ICD-10-CM | POA: Diagnosis not present

## 2017-05-14 DIAGNOSIS — E1140 Type 2 diabetes mellitus with diabetic neuropathy, unspecified: Secondary | ICD-10-CM | POA: Diagnosis not present

## 2017-05-14 DIAGNOSIS — E1122 Type 2 diabetes mellitus with diabetic chronic kidney disease: Secondary | ICD-10-CM | POA: Diagnosis not present

## 2017-05-14 DIAGNOSIS — G6289 Other specified polyneuropathies: Secondary | ICD-10-CM | POA: Diagnosis not present

## 2017-05-14 DIAGNOSIS — K7581 Nonalcoholic steatohepatitis (NASH): Secondary | ICD-10-CM | POA: Diagnosis not present

## 2017-05-14 DIAGNOSIS — N183 Chronic kidney disease, stage 3 (moderate): Secondary | ICD-10-CM | POA: Diagnosis not present

## 2017-05-14 DIAGNOSIS — I10 Essential (primary) hypertension: Secondary | ICD-10-CM | POA: Diagnosis not present

## 2017-05-14 DIAGNOSIS — M810 Age-related osteoporosis without current pathological fracture: Secondary | ICD-10-CM | POA: Diagnosis not present

## 2017-05-14 DIAGNOSIS — K219 Gastro-esophageal reflux disease without esophagitis: Secondary | ICD-10-CM | POA: Diagnosis not present

## 2017-05-14 DIAGNOSIS — E039 Hypothyroidism, unspecified: Secondary | ICD-10-CM | POA: Diagnosis not present

## 2017-05-14 DIAGNOSIS — E6609 Other obesity due to excess calories: Secondary | ICD-10-CM | POA: Diagnosis not present

## 2017-05-28 DIAGNOSIS — Z79899 Other long term (current) drug therapy: Secondary | ICD-10-CM | POA: Diagnosis not present

## 2017-05-28 DIAGNOSIS — Z809 Family history of malignant neoplasm, unspecified: Secondary | ICD-10-CM | POA: Diagnosis not present

## 2017-05-28 DIAGNOSIS — M7521 Bicipital tendinitis, right shoulder: Secondary | ICD-10-CM | POA: Diagnosis not present

## 2017-05-28 DIAGNOSIS — Z882 Allergy status to sulfonamides status: Secondary | ICD-10-CM | POA: Diagnosis not present

## 2017-05-28 DIAGNOSIS — G629 Polyneuropathy, unspecified: Secondary | ICD-10-CM | POA: Diagnosis not present

## 2017-05-28 DIAGNOSIS — Z833 Family history of diabetes mellitus: Secondary | ICD-10-CM | POA: Diagnosis not present

## 2017-05-28 DIAGNOSIS — F419 Anxiety disorder, unspecified: Secondary | ICD-10-CM | POA: Diagnosis not present

## 2017-05-28 DIAGNOSIS — M1711 Unilateral primary osteoarthritis, right knee: Secondary | ICD-10-CM | POA: Diagnosis not present

## 2017-05-28 DIAGNOSIS — F329 Major depressive disorder, single episode, unspecified: Secondary | ICD-10-CM | POA: Diagnosis not present

## 2017-05-28 DIAGNOSIS — E039 Hypothyroidism, unspecified: Secondary | ICD-10-CM | POA: Diagnosis not present

## 2017-05-28 DIAGNOSIS — E119 Type 2 diabetes mellitus without complications: Secondary | ICD-10-CM | POA: Diagnosis not present

## 2017-05-28 DIAGNOSIS — Z90710 Acquired absence of both cervix and uterus: Secondary | ICD-10-CM | POA: Diagnosis not present

## 2017-05-28 DIAGNOSIS — E669 Obesity, unspecified: Secondary | ICD-10-CM | POA: Diagnosis not present

## 2017-05-28 DIAGNOSIS — M19011 Primary osteoarthritis, right shoulder: Secondary | ICD-10-CM | POA: Diagnosis not present

## 2017-05-28 DIAGNOSIS — K219 Gastro-esophageal reflux disease without esophagitis: Secondary | ICD-10-CM | POA: Diagnosis not present

## 2017-05-28 DIAGNOSIS — M810 Age-related osteoporosis without current pathological fracture: Secondary | ICD-10-CM | POA: Diagnosis not present

## 2017-05-28 DIAGNOSIS — Z8249 Family history of ischemic heart disease and other diseases of the circulatory system: Secondary | ICD-10-CM | POA: Diagnosis not present

## 2017-05-28 DIAGNOSIS — Z6841 Body Mass Index (BMI) 40.0 and over, adult: Secondary | ICD-10-CM | POA: Diagnosis not present

## 2017-05-28 DIAGNOSIS — M75121 Complete rotator cuff tear or rupture of right shoulder, not specified as traumatic: Secondary | ICD-10-CM | POA: Diagnosis not present

## 2017-05-28 DIAGNOSIS — I10 Essential (primary) hypertension: Secondary | ICD-10-CM | POA: Diagnosis not present

## 2017-05-28 DIAGNOSIS — Z9049 Acquired absence of other specified parts of digestive tract: Secondary | ICD-10-CM | POA: Diagnosis not present

## 2017-05-29 DIAGNOSIS — G8918 Other acute postprocedural pain: Secondary | ICD-10-CM | POA: Diagnosis not present

## 2017-05-29 DIAGNOSIS — I10 Essential (primary) hypertension: Secondary | ICD-10-CM | POA: Diagnosis not present

## 2017-05-29 DIAGNOSIS — F419 Anxiety disorder, unspecified: Secondary | ICD-10-CM | POA: Diagnosis not present

## 2017-05-29 DIAGNOSIS — K219 Gastro-esophageal reflux disease without esophagitis: Secondary | ICD-10-CM | POA: Diagnosis not present

## 2017-05-29 DIAGNOSIS — Z79899 Other long term (current) drug therapy: Secondary | ICD-10-CM | POA: Diagnosis not present

## 2017-05-29 DIAGNOSIS — E039 Hypothyroidism, unspecified: Secondary | ICD-10-CM | POA: Diagnosis not present

## 2017-05-29 DIAGNOSIS — G629 Polyneuropathy, unspecified: Secondary | ICD-10-CM | POA: Diagnosis not present

## 2017-05-29 DIAGNOSIS — E119 Type 2 diabetes mellitus without complications: Secondary | ICD-10-CM | POA: Diagnosis not present

## 2017-05-29 DIAGNOSIS — M7521 Bicipital tendinitis, right shoulder: Secondary | ICD-10-CM | POA: Diagnosis not present

## 2017-05-29 DIAGNOSIS — M1711 Unilateral primary osteoarthritis, right knee: Secondary | ICD-10-CM | POA: Diagnosis not present

## 2017-05-29 DIAGNOSIS — M67921 Unspecified disorder of synovium and tendon, right upper arm: Secondary | ICD-10-CM | POA: Diagnosis not present

## 2017-05-29 DIAGNOSIS — M75101 Unspecified rotator cuff tear or rupture of right shoulder, not specified as traumatic: Secondary | ICD-10-CM | POA: Diagnosis not present

## 2017-05-29 DIAGNOSIS — M7541 Impingement syndrome of right shoulder: Secondary | ICD-10-CM | POA: Diagnosis not present

## 2017-05-29 DIAGNOSIS — M19011 Primary osteoarthritis, right shoulder: Secondary | ICD-10-CM | POA: Diagnosis not present

## 2017-05-29 DIAGNOSIS — M810 Age-related osteoporosis without current pathological fracture: Secondary | ICD-10-CM | POA: Diagnosis not present

## 2017-05-29 DIAGNOSIS — M75121 Complete rotator cuff tear or rupture of right shoulder, not specified as traumatic: Secondary | ICD-10-CM | POA: Diagnosis not present

## 2017-06-08 DIAGNOSIS — E1140 Type 2 diabetes mellitus with diabetic neuropathy, unspecified: Secondary | ICD-10-CM | POA: Diagnosis not present

## 2017-06-08 DIAGNOSIS — M75121 Complete rotator cuff tear or rupture of right shoulder, not specified as traumatic: Secondary | ICD-10-CM | POA: Diagnosis not present

## 2017-06-08 DIAGNOSIS — B351 Tinea unguium: Secondary | ICD-10-CM | POA: Diagnosis not present

## 2017-06-08 DIAGNOSIS — M79673 Pain in unspecified foot: Secondary | ICD-10-CM | POA: Diagnosis not present

## 2017-06-08 DIAGNOSIS — L851 Acquired keratosis [keratoderma] palmaris et plantaris: Secondary | ICD-10-CM | POA: Diagnosis not present

## 2017-06-09 DIAGNOSIS — Z4789 Encounter for other orthopedic aftercare: Secondary | ICD-10-CM | POA: Diagnosis not present

## 2017-06-09 DIAGNOSIS — M25511 Pain in right shoulder: Secondary | ICD-10-CM | POA: Diagnosis not present

## 2017-06-11 DIAGNOSIS — M25511 Pain in right shoulder: Secondary | ICD-10-CM | POA: Diagnosis not present

## 2017-06-11 DIAGNOSIS — Z4789 Encounter for other orthopedic aftercare: Secondary | ICD-10-CM | POA: Diagnosis not present

## 2017-06-15 DIAGNOSIS — M25511 Pain in right shoulder: Secondary | ICD-10-CM | POA: Diagnosis not present

## 2017-06-15 DIAGNOSIS — Z4789 Encounter for other orthopedic aftercare: Secondary | ICD-10-CM | POA: Diagnosis not present

## 2017-06-17 DIAGNOSIS — M25511 Pain in right shoulder: Secondary | ICD-10-CM | POA: Diagnosis not present

## 2017-06-17 DIAGNOSIS — Z4789 Encounter for other orthopedic aftercare: Secondary | ICD-10-CM | POA: Diagnosis not present

## 2017-06-19 DIAGNOSIS — Z4789 Encounter for other orthopedic aftercare: Secondary | ICD-10-CM | POA: Diagnosis not present

## 2017-06-19 DIAGNOSIS — M25511 Pain in right shoulder: Secondary | ICD-10-CM | POA: Diagnosis not present

## 2017-06-22 DIAGNOSIS — Z4789 Encounter for other orthopedic aftercare: Secondary | ICD-10-CM | POA: Diagnosis not present

## 2017-06-22 DIAGNOSIS — M25511 Pain in right shoulder: Secondary | ICD-10-CM | POA: Diagnosis not present

## 2017-06-24 DIAGNOSIS — Z4789 Encounter for other orthopedic aftercare: Secondary | ICD-10-CM | POA: Diagnosis not present

## 2017-06-24 DIAGNOSIS — M25511 Pain in right shoulder: Secondary | ICD-10-CM | POA: Diagnosis not present

## 2017-06-29 DIAGNOSIS — M25511 Pain in right shoulder: Secondary | ICD-10-CM | POA: Diagnosis not present

## 2017-06-29 DIAGNOSIS — Z4789 Encounter for other orthopedic aftercare: Secondary | ICD-10-CM | POA: Diagnosis not present

## 2017-07-01 DIAGNOSIS — M25511 Pain in right shoulder: Secondary | ICD-10-CM | POA: Diagnosis not present

## 2017-07-01 DIAGNOSIS — Z4789 Encounter for other orthopedic aftercare: Secondary | ICD-10-CM | POA: Diagnosis not present

## 2017-07-02 DIAGNOSIS — M25562 Pain in left knee: Secondary | ICD-10-CM | POA: Diagnosis not present

## 2017-07-02 DIAGNOSIS — M1712 Unilateral primary osteoarthritis, left knee: Secondary | ICD-10-CM | POA: Diagnosis not present

## 2017-07-03 DIAGNOSIS — Z4789 Encounter for other orthopedic aftercare: Secondary | ICD-10-CM | POA: Diagnosis not present

## 2017-07-03 DIAGNOSIS — M25511 Pain in right shoulder: Secondary | ICD-10-CM | POA: Diagnosis not present

## 2017-07-06 DIAGNOSIS — Z4789 Encounter for other orthopedic aftercare: Secondary | ICD-10-CM | POA: Diagnosis not present

## 2017-07-06 DIAGNOSIS — M25511 Pain in right shoulder: Secondary | ICD-10-CM | POA: Diagnosis not present

## 2017-07-08 DIAGNOSIS — S91114A Laceration without foreign body of right lesser toe(s) without damage to nail, initial encounter: Secondary | ICD-10-CM | POA: Diagnosis not present

## 2017-07-08 DIAGNOSIS — M25511 Pain in right shoulder: Secondary | ICD-10-CM | POA: Diagnosis not present

## 2017-07-10 DIAGNOSIS — S91114A Laceration without foreign body of right lesser toe(s) without damage to nail, initial encounter: Secondary | ICD-10-CM | POA: Diagnosis not present

## 2017-07-10 DIAGNOSIS — M25511 Pain in right shoulder: Secondary | ICD-10-CM | POA: Diagnosis not present

## 2017-07-10 DIAGNOSIS — Z6841 Body Mass Index (BMI) 40.0 and over, adult: Secondary | ICD-10-CM | POA: Diagnosis not present

## 2017-07-13 DIAGNOSIS — E1122 Type 2 diabetes mellitus with diabetic chronic kidney disease: Secondary | ICD-10-CM | POA: Diagnosis not present

## 2017-07-13 DIAGNOSIS — G6289 Other specified polyneuropathies: Secondary | ICD-10-CM | POA: Diagnosis not present

## 2017-07-13 DIAGNOSIS — E039 Hypothyroidism, unspecified: Secondary | ICD-10-CM | POA: Diagnosis not present

## 2017-07-13 DIAGNOSIS — E8881 Metabolic syndrome: Secondary | ICD-10-CM | POA: Diagnosis not present

## 2017-07-13 DIAGNOSIS — I10 Essential (primary) hypertension: Secondary | ICD-10-CM | POA: Diagnosis not present

## 2017-07-13 DIAGNOSIS — E782 Mixed hyperlipidemia: Secondary | ICD-10-CM | POA: Diagnosis not present

## 2017-07-13 DIAGNOSIS — E1140 Type 2 diabetes mellitus with diabetic neuropathy, unspecified: Secondary | ICD-10-CM | POA: Diagnosis not present

## 2017-07-13 DIAGNOSIS — Z6841 Body Mass Index (BMI) 40.0 and over, adult: Secondary | ICD-10-CM | POA: Diagnosis not present

## 2017-07-13 DIAGNOSIS — M25511 Pain in right shoulder: Secondary | ICD-10-CM | POA: Diagnosis not present

## 2017-07-17 DIAGNOSIS — M25511 Pain in right shoulder: Secondary | ICD-10-CM | POA: Diagnosis not present

## 2017-07-20 ENCOUNTER — Encounter (INDEPENDENT_AMBULATORY_CARE_PROVIDER_SITE_OTHER): Payer: Self-pay

## 2017-07-20 ENCOUNTER — Encounter (INDEPENDENT_AMBULATORY_CARE_PROVIDER_SITE_OTHER): Payer: Self-pay | Admitting: Internal Medicine

## 2017-07-22 DIAGNOSIS — M25511 Pain in right shoulder: Secondary | ICD-10-CM | POA: Diagnosis not present

## 2017-07-27 DIAGNOSIS — M25511 Pain in right shoulder: Secondary | ICD-10-CM | POA: Diagnosis not present

## 2017-07-28 ENCOUNTER — Other Ambulatory Visit (HOSPITAL_BASED_OUTPATIENT_CLINIC_OR_DEPARTMENT_OTHER): Payer: Self-pay

## 2017-07-28 DIAGNOSIS — G4730 Sleep apnea, unspecified: Secondary | ICD-10-CM

## 2017-07-29 DIAGNOSIS — M25511 Pain in right shoulder: Secondary | ICD-10-CM | POA: Diagnosis not present

## 2017-07-30 ENCOUNTER — Ambulatory Visit: Payer: Medicare Other | Attending: Family Medicine | Admitting: Neurology

## 2017-07-30 DIAGNOSIS — R0683 Snoring: Secondary | ICD-10-CM | POA: Diagnosis not present

## 2017-07-30 DIAGNOSIS — G4730 Sleep apnea, unspecified: Secondary | ICD-10-CM | POA: Diagnosis not present

## 2017-07-30 DIAGNOSIS — G4733 Obstructive sleep apnea (adult) (pediatric): Secondary | ICD-10-CM | POA: Insufficient documentation

## 2017-08-04 ENCOUNTER — Encounter (INDEPENDENT_AMBULATORY_CARE_PROVIDER_SITE_OTHER): Payer: Self-pay | Admitting: Internal Medicine

## 2017-08-04 ENCOUNTER — Encounter (INDEPENDENT_AMBULATORY_CARE_PROVIDER_SITE_OTHER): Payer: Self-pay | Admitting: *Deleted

## 2017-08-04 ENCOUNTER — Ambulatory Visit (INDEPENDENT_AMBULATORY_CARE_PROVIDER_SITE_OTHER): Payer: Medicare Other | Admitting: Internal Medicine

## 2017-08-04 VITALS — BP 130/70 | HR 104 | Temp 98.0°F | Ht 66.0 in | Wt 249.1 lb

## 2017-08-04 DIAGNOSIS — R1310 Dysphagia, unspecified: Secondary | ICD-10-CM

## 2017-08-04 DIAGNOSIS — E119 Type 2 diabetes mellitus without complications: Secondary | ICD-10-CM | POA: Insufficient documentation

## 2017-08-04 DIAGNOSIS — R1319 Other dysphagia: Secondary | ICD-10-CM

## 2017-08-04 HISTORY — DX: Type 2 diabetes mellitus without complications: E11.9

## 2017-08-04 NOTE — Progress Notes (Signed)
Subjective:    Patient ID: Kathryn Ryan, female    DOB: 1941-04-14, 76 y.o.   MRN: [id number]  She tells   Referred by DR. Daniel for GERD. She  Tells me she chokes on water and solid foods.  Symptoms x 10 yrs. No foods in particular bother her. Cake bothers her. She will have to cough it up or drink fluid for it to pass. Her appetite is good. No weight loss. Acid reflux controlled with Aciphex.  She takes Motrin as needed. She has a BM x 1 a day with the Colace. No melena or BRRB.  She has never undergone a colonoscopy in the past.     Review of Systems Past Medical History:  Diagnosis Date  . Anxiety   . Diabetes ([place]) 08/04/2017  . Diabetes mellitus without complication ([place])    new onset diabetic   . GERD (gastroesophageal reflux disease)   . H/O hiatal hernia   . Hypertension   . Hypothyroidism   . Lumbar radiculopathy   . Neuropathy    non related to DM-not sure why  . Neuropathy    non DM related  . Spinal stenosis of lumbar region     Past Surgical History:  Procedure Laterality Date  . ABDOMINAL HYSTERECTOMY    . BACK SURGERY    . BREAST SURGERY     bilateral lumpectomies  . BUNIONECTOMY     bilateral  . CHOLECYSTECTOMY    . HARDWARE REMOVAL Left 04/26/2014   Procedure: HARDWARE REMOVAL LEFT ANKLE;  Surgeon: Wylene Simmer, MD;  Location: [facility];  Service: Orthopedics;  Laterality: Left;  . I&D EXTREMITY Left 04/26/2014   Procedure: IRRIGATION AND DEBRIDEMENT LEFT ANKLE;  Surgeon: Wylene Simmer, MD;  Location: [facility];  Service: Orthopedics;  Laterality: Left;  . IRRIGATION AND DEBRIDEMENT ABSCESS Left 04/26/2014  . LEFT OOPHORECTOMY    . PERIPHERALLY INSERTED CENTRAL CATHETER INSERTION  04/26/2014   rt upper arm  . TONSILLECTOMY     6 yrs    Allergies  Allergen Reactions  . Statins Other (See Comments)    Joint pain   . Sulfa Antibiotics Itching    Current Outpatient Prescriptions on File Prior to Visit  Medication Sig Dispense Refill  . Docusate  Sodium (COLACE PO) Take 2 capsules by mouth daily as needed.     . DULoxetine (CYMBALTA) 60 MG capsule Take 60 mg by mouth 2 (two) times daily.    Marland Kitchen levothyroxine (SYNTHROID, LEVOTHROID) 125 MCG tablet Take 1 tablet by mouth daily.    Marland Kitchen losartan-hydrochlorothiazide (HYZAAR) 100-12.5 MG tablet Take 1 tablet by mouth daily.    . pregabalin (LYRICA) 150 MG capsule Take 300 mg by mouth 2 (two) times daily.     . metFORMIN (GLUCOPHAGE-XR) 500 MG 24 hr tablet Take 500 mg by mouth daily.     No current facility-administered medications on file prior to visit.         Objective:   Physical Exam Blood pressure 130/70, pulse (!) 104, temperature 98 F (36.7 C), height 5\' 6"  (1.676 m), weight 249 lb 1.6 oz (113 kg). Alert and oriented. Skin warm and dry. Oral mucosa is moist.   . Sclera anicteric, conjunctivae is pink. Thyroid not enlarged. No cervical lymphadenopathy. Lungs clear. Heart regular rate and rhythm.  Abdomen is soft. Bowel sounds are positive. No hepatomegaly. No abdominal masses felt. No tenderness.  No edema to lower extremities.        Assessment & Plan:  Dysphagia. Am going to get a DG esophagram.  Further recommendations to follow.

## 2017-08-04 NOTE — Patient Instructions (Signed)
DG esophagram.   

## 2017-08-05 DIAGNOSIS — M25511 Pain in right shoulder: Secondary | ICD-10-CM | POA: Diagnosis not present

## 2017-08-06 ENCOUNTER — Ambulatory Visit (HOSPITAL_COMMUNITY)
Admission: RE | Admit: 2017-08-06 | Discharge: 2017-08-06 | Disposition: A | Discharge status: Home / Self Care | Payer: Medicare Other | Source: Ambulatory Visit | Attending: Internal Medicine | Admitting: Internal Medicine

## 2017-08-06 DIAGNOSIS — R1310 Dysphagia, unspecified: Secondary | ICD-10-CM

## 2017-08-06 DIAGNOSIS — K449 Diaphragmatic hernia without obstruction or gangrene: Secondary | ICD-10-CM | POA: Insufficient documentation

## 2017-08-06 DIAGNOSIS — R1312 Dysphagia, oropharyngeal phase: Secondary | ICD-10-CM | POA: Diagnosis not present

## 2017-08-06 DIAGNOSIS — R1319 Other dysphagia: Secondary | ICD-10-CM

## 2017-08-06 DIAGNOSIS — Q394 Esophageal web: Secondary | ICD-10-CM | POA: Diagnosis not present

## 2017-08-07 DIAGNOSIS — M25511 Pain in right shoulder: Secondary | ICD-10-CM | POA: Diagnosis not present

## 2017-08-11 DIAGNOSIS — D649 Anemia, unspecified: Secondary | ICD-10-CM | POA: Diagnosis not present

## 2017-08-11 DIAGNOSIS — I10 Essential (primary) hypertension: Secondary | ICD-10-CM | POA: Diagnosis not present

## 2017-08-11 DIAGNOSIS — Z9189 Other specified personal risk factors, not elsewhere classified: Secondary | ICD-10-CM | POA: Diagnosis not present

## 2017-08-11 DIAGNOSIS — E119 Type 2 diabetes mellitus without complications: Secondary | ICD-10-CM | POA: Diagnosis not present

## 2017-08-11 DIAGNOSIS — E1122 Type 2 diabetes mellitus with diabetic chronic kidney disease: Secondary | ICD-10-CM | POA: Diagnosis not present

## 2017-08-11 DIAGNOSIS — K210 Gastro-esophageal reflux disease with esophagitis: Secondary | ICD-10-CM | POA: Diagnosis not present

## 2017-08-11 DIAGNOSIS — E039 Hypothyroidism, unspecified: Secondary | ICD-10-CM | POA: Diagnosis not present

## 2017-08-11 DIAGNOSIS — D519 Vitamin B12 deficiency anemia, unspecified: Secondary | ICD-10-CM | POA: Diagnosis not present

## 2017-08-11 DIAGNOSIS — E782 Mixed hyperlipidemia: Secondary | ICD-10-CM | POA: Diagnosis not present

## 2017-08-11 DIAGNOSIS — E1140 Type 2 diabetes mellitus with diabetic neuropathy, unspecified: Secondary | ICD-10-CM | POA: Diagnosis not present

## 2017-08-12 DIAGNOSIS — Z9889 Other specified postprocedural states: Secondary | ICD-10-CM | POA: Diagnosis not present

## 2017-08-12 DIAGNOSIS — M170 Bilateral primary osteoarthritis of knee: Secondary | ICD-10-CM | POA: Diagnosis not present

## 2017-08-12 DIAGNOSIS — M25562 Pain in left knee: Secondary | ICD-10-CM | POA: Diagnosis not present

## 2017-08-13 DIAGNOSIS — E1122 Type 2 diabetes mellitus with diabetic chronic kidney disease: Secondary | ICD-10-CM | POA: Diagnosis not present

## 2017-08-13 DIAGNOSIS — Z23 Encounter for immunization: Secondary | ICD-10-CM | POA: Diagnosis not present

## 2017-08-13 DIAGNOSIS — E1140 Type 2 diabetes mellitus with diabetic neuropathy, unspecified: Secondary | ICD-10-CM | POA: Diagnosis not present

## 2017-08-13 DIAGNOSIS — E8881 Metabolic syndrome: Secondary | ICD-10-CM | POA: Diagnosis not present

## 2017-08-13 DIAGNOSIS — E039 Hypothyroidism, unspecified: Secondary | ICD-10-CM | POA: Diagnosis not present

## 2017-08-13 DIAGNOSIS — E782 Mixed hyperlipidemia: Secondary | ICD-10-CM | POA: Diagnosis not present

## 2017-08-13 DIAGNOSIS — I10 Essential (primary) hypertension: Secondary | ICD-10-CM | POA: Diagnosis not present

## 2017-08-13 DIAGNOSIS — Z6841 Body Mass Index (BMI) 40.0 and over, adult: Secondary | ICD-10-CM | POA: Diagnosis not present

## 2017-08-14 DIAGNOSIS — M25511 Pain in right shoulder: Secondary | ICD-10-CM | POA: Diagnosis not present

## 2017-08-17 DIAGNOSIS — M25511 Pain in right shoulder: Secondary | ICD-10-CM | POA: Diagnosis not present

## 2017-08-19 DIAGNOSIS — M25511 Pain in right shoulder: Secondary | ICD-10-CM | POA: Diagnosis not present

## 2017-08-24 DIAGNOSIS — E1140 Type 2 diabetes mellitus with diabetic neuropathy, unspecified: Secondary | ICD-10-CM | POA: Diagnosis not present

## 2017-08-24 DIAGNOSIS — B351 Tinea unguium: Secondary | ICD-10-CM | POA: Diagnosis not present

## 2017-08-24 DIAGNOSIS — M79673 Pain in unspecified foot: Secondary | ICD-10-CM | POA: Diagnosis not present

## 2017-08-24 DIAGNOSIS — L851 Acquired keratosis [keratoderma] palmaris et plantaris: Secondary | ICD-10-CM | POA: Diagnosis not present

## 2017-08-26 DIAGNOSIS — M25511 Pain in right shoulder: Secondary | ICD-10-CM | POA: Diagnosis not present

## 2017-09-03 DIAGNOSIS — M2042 Other hammer toe(s) (acquired), left foot: Secondary | ICD-10-CM | POA: Diagnosis not present

## 2017-09-03 DIAGNOSIS — L97509 Non-pressure chronic ulcer of other part of unspecified foot with unspecified severity: Secondary | ICD-10-CM | POA: Diagnosis not present

## 2017-09-04 DIAGNOSIS — M25511 Pain in right shoulder: Secondary | ICD-10-CM | POA: Diagnosis not present

## 2017-09-07 DIAGNOSIS — M25511 Pain in right shoulder: Secondary | ICD-10-CM | POA: Diagnosis not present

## 2017-09-09 DIAGNOSIS — M25511 Pain in right shoulder: Secondary | ICD-10-CM | POA: Diagnosis not present

## 2017-09-14 DIAGNOSIS — L97509 Non-pressure chronic ulcer of other part of unspecified foot with unspecified severity: Secondary | ICD-10-CM | POA: Diagnosis not present

## 2017-09-14 DIAGNOSIS — M25511 Pain in right shoulder: Secondary | ICD-10-CM | POA: Diagnosis not present

## 2017-09-14 DIAGNOSIS — M2042 Other hammer toe(s) (acquired), left foot: Secondary | ICD-10-CM | POA: Diagnosis not present

## 2017-09-14 DIAGNOSIS — Z9889 Other specified postprocedural states: Secondary | ICD-10-CM | POA: Diagnosis not present

## 2017-09-16 DIAGNOSIS — M25511 Pain in right shoulder: Secondary | ICD-10-CM | POA: Diagnosis not present

## 2017-09-21 DIAGNOSIS — M25511 Pain in right shoulder: Secondary | ICD-10-CM | POA: Diagnosis not present

## 2017-09-23 DIAGNOSIS — M25562 Pain in left knee: Secondary | ICD-10-CM | POA: Diagnosis not present

## 2017-09-23 DIAGNOSIS — M25511 Pain in right shoulder: Secondary | ICD-10-CM | POA: Diagnosis not present

## 2017-09-23 DIAGNOSIS — M170 Bilateral primary osteoarthritis of knee: Secondary | ICD-10-CM | POA: Diagnosis not present

## 2017-09-23 DIAGNOSIS — M75121 Complete rotator cuff tear or rupture of right shoulder, not specified as traumatic: Secondary | ICD-10-CM | POA: Diagnosis not present

## 2017-09-23 DIAGNOSIS — G8929 Other chronic pain: Secondary | ICD-10-CM | POA: Diagnosis not present

## 2017-09-28 DIAGNOSIS — M25511 Pain in right shoulder: Secondary | ICD-10-CM | POA: Diagnosis not present

## 2017-10-08 ENCOUNTER — Ambulatory Visit (HOSPITAL_BASED_OUTPATIENT_CLINIC_OR_DEPARTMENT_OTHER): Payer: Medicare Other | Attending: Family Medicine | Admitting: Internal Medicine

## 2017-10-08 DIAGNOSIS — G4733 Obstructive sleep apnea (adult) (pediatric): Secondary | ICD-10-CM

## 2017-10-17 DIAGNOSIS — G4733 Obstructive sleep apnea (adult) (pediatric): Secondary | ICD-10-CM

## 2017-10-17 NOTE — Procedures (Signed)
Patient Name: Kathryn Ryan, Kathryn Ryan Date: 10/08/2017 Gender: Female D.O.B: 1941-11-29 Age (years): 82 Referring Provider: Caryl Bis Height (inches): 58 Interpreting Physician: Baird Lyons MD, ABSM Weight (lbs): 250 RPSGT: Jonna Coup BMI: 42 MRN: [id number] Neck Size: CLINICAL INFORMATION Sleep Study Type: HST  Indication for sleep study: OSA  Epworth Sleepiness Score: none reported  SLEEP STUDY TECHNIQUE A multi-channel overnight portable sleep study was performed. The channels recorded were: nasal airflow, thoracic respiratory movement, and oxygen saturation with a pulse oximetry. Snoring was also monitored.  MEDICATIONS Patient self administered medications include: none reported.  SLEEP ARCHITECTURE Patient was studied for 423.3 minutes. The sleep efficiency was 90.2 % and the patient was supine for 65.4%. The arousal index was 0.0 per hour.  RESPIRATORY PARAMETERS The overall AHI was 10.9 per hour, with a central apnea index of 0.0 per hour.  The oxygen nadir was 77% during sleep.  CARDIAC DATA Mean heart rate during sleep was 106.3 bpm.  IMPRESSIONS - Mild obstructive sleep apnea occurred during this study (AHI = 10.9/h). - No significant central sleep apnea occurred during this study (CAI = 0.0/h). - Oxygen desaturation was noted during this study (Min O2 = 77%, Mean 89%). - Patient snored .  DIAGNOSIS - Obstructive Sleep Apnea (327.23 [G47.33 ICD-10]) - Nocturnal Hypoxemia (327.26 [G47.36 ICD-10])  RECOMMENDATIONS - Consider CPAP titration in light of the sustained hypoxemia. Other options would be based on clinical judgment. - Surgical consultation for Uvulopalatopharyngoplasty (UPPP) may be considered. - Oral appliance may be considered. - Avoid alcohol, sedatives and other CNS depressants that may worsen sleep apnea and disrupt normal sleep architecture. - Sleep hygiene should be reviewed to assess factors that may improve sleep quality. -  Weight management and regular exercise should be initiated or continued.  [Electronically signed] 10/17/2017 01:31 PM  Baird Lyons MD, [place], [place] Board of Sleep Medicine   NPI: [id number]

## 2017-10-19 DIAGNOSIS — S92919A Unspecified fracture of unspecified toe(s), initial encounter for closed fracture: Secondary | ICD-10-CM | POA: Diagnosis not present

## 2017-10-19 DIAGNOSIS — S92912G Unspecified fracture of left toe(s), subsequent encounter for fracture with delayed healing: Secondary | ICD-10-CM | POA: Diagnosis not present

## 2017-11-05 ENCOUNTER — Telehealth: Payer: Self-pay | Admitting: Internal Medicine

## 2017-11-05 NOTE — Telephone Encounter (Signed)
Per CY-okay to see patient as [place] at 3pm Tuesday 11-10-17. No later appts as CY is usually not here in the afternoons. Thanks .

## 2017-11-05 NOTE — Telephone Encounter (Signed)
Called and spoke with pt's son  Tommie Raymond. Scheduled pt with an appt with CY 11/10/17 at 3pm. Randall expressed understanding. Nothing further needed.

## 2017-11-05 NOTE — Telephone Encounter (Signed)
Spoke with pt's son, Tommie Raymond, who stated that pt has had two sleep studies done, last one was done 10/08/17.  Dr. Annamaria Boots, please advise when we can get pt in for a sleep consult.  Thanks!

## 2017-11-10 ENCOUNTER — Institutional Professional Consult (permissible substitution): Payer: Medicare Other | Admitting: Internal Medicine

## 2017-11-10 DIAGNOSIS — I10 Essential (primary) hypertension: Secondary | ICD-10-CM | POA: Diagnosis not present

## 2017-11-10 DIAGNOSIS — M5136 Other intervertebral disc degeneration, lumbar region: Secondary | ICD-10-CM | POA: Diagnosis not present

## 2017-11-10 DIAGNOSIS — R531 Weakness: Secondary | ICD-10-CM | POA: Diagnosis not present

## 2017-11-10 DIAGNOSIS — D509 Iron deficiency anemia, unspecified: Secondary | ICD-10-CM | POA: Diagnosis not present

## 2017-11-10 DIAGNOSIS — S0990XA Unspecified injury of head, initial encounter: Secondary | ICD-10-CM | POA: Diagnosis not present

## 2017-11-10 DIAGNOSIS — K219 Gastro-esophageal reflux disease without esophagitis: Secondary | ICD-10-CM | POA: Diagnosis not present

## 2017-11-10 DIAGNOSIS — R404 Transient alteration of awareness: Secondary | ICD-10-CM | POA: Diagnosis not present

## 2017-11-10 DIAGNOSIS — Z981 Arthrodesis status: Secondary | ICD-10-CM | POA: Diagnosis not present

## 2017-11-10 DIAGNOSIS — Z9181 History of falling: Secondary | ICD-10-CM | POA: Diagnosis not present

## 2017-11-10 DIAGNOSIS — E039 Hypothyroidism, unspecified: Secondary | ICD-10-CM | POA: Diagnosis not present

## 2017-11-10 DIAGNOSIS — G4733 Obstructive sleep apnea (adult) (pediatric): Secondary | ICD-10-CM | POA: Diagnosis not present

## 2017-11-10 DIAGNOSIS — E1142 Type 2 diabetes mellitus with diabetic polyneuropathy: Secondary | ICD-10-CM | POA: Diagnosis not present

## 2017-11-10 DIAGNOSIS — F338 Other recurrent depressive disorders: Secondary | ICD-10-CM | POA: Diagnosis not present

## 2017-11-10 DIAGNOSIS — R262 Difficulty in walking, not elsewhere classified: Secondary | ICD-10-CM | POA: Diagnosis not present

## 2017-11-10 DIAGNOSIS — B9689 Other specified bacterial agents as the cause of diseases classified elsewhere: Secondary | ICD-10-CM | POA: Diagnosis not present

## 2017-11-10 DIAGNOSIS — Z9049 Acquired absence of other specified parts of digestive tract: Secondary | ICD-10-CM | POA: Diagnosis not present

## 2017-11-10 DIAGNOSIS — M1990 Unspecified osteoarthritis, unspecified site: Secondary | ICD-10-CM | POA: Diagnosis not present

## 2017-11-10 DIAGNOSIS — Z882 Allergy status to sulfonamides status: Secondary | ICD-10-CM | POA: Diagnosis not present

## 2017-11-10 DIAGNOSIS — Z90710 Acquired absence of both cervix and uterus: Secondary | ICD-10-CM | POA: Diagnosis not present

## 2017-11-10 DIAGNOSIS — E860 Dehydration: Secondary | ICD-10-CM | POA: Diagnosis not present

## 2017-11-10 DIAGNOSIS — R55 Syncope and collapse: Secondary | ICD-10-CM | POA: Diagnosis not present

## 2017-11-10 DIAGNOSIS — E785 Hyperlipidemia, unspecified: Secondary | ICD-10-CM | POA: Diagnosis not present

## 2017-11-10 DIAGNOSIS — N390 Urinary tract infection, site not specified: Secondary | ICD-10-CM | POA: Diagnosis not present

## 2017-11-10 DIAGNOSIS — D649 Anemia, unspecified: Secondary | ICD-10-CM | POA: Diagnosis not present

## 2017-11-10 DIAGNOSIS — G4736 Sleep related hypoventilation in conditions classified elsewhere: Secondary | ICD-10-CM | POA: Diagnosis not present

## 2017-11-10 DIAGNOSIS — Z79899 Other long term (current) drug therapy: Secondary | ICD-10-CM | POA: Diagnosis not present

## 2017-11-10 DIAGNOSIS — Z794 Long term (current) use of insulin: Secondary | ICD-10-CM | POA: Diagnosis not present

## 2017-11-10 DIAGNOSIS — Z6823 Body mass index (BMI) 23.0-23.9, adult: Secondary | ICD-10-CM | POA: Diagnosis not present

## 2017-11-11 DIAGNOSIS — R55 Syncope and collapse: Secondary | ICD-10-CM | POA: Diagnosis not present

## 2017-11-14 DIAGNOSIS — E039 Hypothyroidism, unspecified: Secondary | ICD-10-CM | POA: Diagnosis not present

## 2017-11-14 DIAGNOSIS — E1142 Type 2 diabetes mellitus with diabetic polyneuropathy: Secondary | ICD-10-CM | POA: Diagnosis not present

## 2017-11-14 DIAGNOSIS — M5136 Other intervertebral disc degeneration, lumbar region: Secondary | ICD-10-CM | POA: Diagnosis not present

## 2017-11-14 DIAGNOSIS — Z794 Long term (current) use of insulin: Secondary | ICD-10-CM | POA: Diagnosis not present

## 2017-11-14 DIAGNOSIS — Z87440 Personal history of urinary (tract) infections: Secondary | ICD-10-CM | POA: Diagnosis not present

## 2017-11-14 DIAGNOSIS — I10 Essential (primary) hypertension: Secondary | ICD-10-CM | POA: Diagnosis not present

## 2017-11-14 DIAGNOSIS — D649 Anemia, unspecified: Secondary | ICD-10-CM | POA: Diagnosis not present

## 2017-11-14 DIAGNOSIS — F329 Major depressive disorder, single episode, unspecified: Secondary | ICD-10-CM | POA: Diagnosis not present

## 2017-11-14 DIAGNOSIS — Z9181 History of falling: Secondary | ICD-10-CM | POA: Diagnosis not present

## 2017-11-14 DIAGNOSIS — G4733 Obstructive sleep apnea (adult) (pediatric): Secondary | ICD-10-CM | POA: Diagnosis not present

## 2017-11-14 DIAGNOSIS — M1990 Unspecified osteoarthritis, unspecified site: Secondary | ICD-10-CM | POA: Diagnosis not present

## 2017-11-14 DIAGNOSIS — D509 Iron deficiency anemia, unspecified: Secondary | ICD-10-CM | POA: Diagnosis not present

## 2017-11-18 DIAGNOSIS — F329 Major depressive disorder, single episode, unspecified: Secondary | ICD-10-CM | POA: Diagnosis not present

## 2017-11-18 DIAGNOSIS — E1142 Type 2 diabetes mellitus with diabetic polyneuropathy: Secondary | ICD-10-CM | POA: Diagnosis not present

## 2017-11-18 DIAGNOSIS — M5136 Other intervertebral disc degeneration, lumbar region: Secondary | ICD-10-CM | POA: Diagnosis not present

## 2017-11-18 DIAGNOSIS — I10 Essential (primary) hypertension: Secondary | ICD-10-CM | POA: Diagnosis not present

## 2017-11-18 DIAGNOSIS — M1990 Unspecified osteoarthritis, unspecified site: Secondary | ICD-10-CM | POA: Diagnosis not present

## 2017-11-20 DIAGNOSIS — F329 Major depressive disorder, single episode, unspecified: Secondary | ICD-10-CM | POA: Diagnosis not present

## 2017-11-20 DIAGNOSIS — M5136 Other intervertebral disc degeneration, lumbar region: Secondary | ICD-10-CM | POA: Diagnosis not present

## 2017-11-20 DIAGNOSIS — M1990 Unspecified osteoarthritis, unspecified site: Secondary | ICD-10-CM | POA: Diagnosis not present

## 2017-11-20 DIAGNOSIS — I10 Essential (primary) hypertension: Secondary | ICD-10-CM | POA: Diagnosis not present

## 2017-11-20 DIAGNOSIS — E1142 Type 2 diabetes mellitus with diabetic polyneuropathy: Secondary | ICD-10-CM | POA: Diagnosis not present

## 2017-11-26 DIAGNOSIS — E1142 Type 2 diabetes mellitus with diabetic polyneuropathy: Secondary | ICD-10-CM | POA: Diagnosis not present

## 2017-11-26 DIAGNOSIS — F329 Major depressive disorder, single episode, unspecified: Secondary | ICD-10-CM | POA: Diagnosis not present

## 2017-11-26 DIAGNOSIS — M1990 Unspecified osteoarthritis, unspecified site: Secondary | ICD-10-CM | POA: Diagnosis not present

## 2017-11-26 DIAGNOSIS — I10 Essential (primary) hypertension: Secondary | ICD-10-CM | POA: Diagnosis not present

## 2017-11-26 DIAGNOSIS — M5136 Other intervertebral disc degeneration, lumbar region: Secondary | ICD-10-CM | POA: Diagnosis not present

## 2017-12-07 DIAGNOSIS — M5136 Other intervertebral disc degeneration, lumbar region: Secondary | ICD-10-CM | POA: Diagnosis not present

## 2017-12-07 DIAGNOSIS — M1990 Unspecified osteoarthritis, unspecified site: Secondary | ICD-10-CM | POA: Diagnosis not present

## 2017-12-07 DIAGNOSIS — I10 Essential (primary) hypertension: Secondary | ICD-10-CM | POA: Diagnosis not present

## 2017-12-07 DIAGNOSIS — F329 Major depressive disorder, single episode, unspecified: Secondary | ICD-10-CM | POA: Diagnosis not present

## 2017-12-07 DIAGNOSIS — E1142 Type 2 diabetes mellitus with diabetic polyneuropathy: Secondary | ICD-10-CM | POA: Diagnosis not present

## 2017-12-11 DIAGNOSIS — R55 Syncope and collapse: Secondary | ICD-10-CM | POA: Diagnosis not present

## 2017-12-11 DIAGNOSIS — Z6841 Body Mass Index (BMI) 40.0 and over, adult: Secondary | ICD-10-CM | POA: Diagnosis not present

## 2017-12-11 DIAGNOSIS — E782 Mixed hyperlipidemia: Secondary | ICD-10-CM | POA: Diagnosis not present

## 2017-12-15 DIAGNOSIS — E782 Mixed hyperlipidemia: Secondary | ICD-10-CM | POA: Diagnosis not present

## 2017-12-15 DIAGNOSIS — F329 Major depressive disorder, single episode, unspecified: Secondary | ICD-10-CM | POA: Diagnosis not present

## 2017-12-15 DIAGNOSIS — E1142 Type 2 diabetes mellitus with diabetic polyneuropathy: Secondary | ICD-10-CM | POA: Diagnosis not present

## 2017-12-15 DIAGNOSIS — Z0000 Encounter for general adult medical examination without abnormal findings: Secondary | ICD-10-CM | POA: Diagnosis not present

## 2017-12-15 DIAGNOSIS — E119 Type 2 diabetes mellitus without complications: Secondary | ICD-10-CM | POA: Diagnosis not present

## 2017-12-15 DIAGNOSIS — D649 Anemia, unspecified: Secondary | ICD-10-CM | POA: Diagnosis not present

## 2017-12-15 DIAGNOSIS — E039 Hypothyroidism, unspecified: Secondary | ICD-10-CM | POA: Diagnosis not present

## 2017-12-15 DIAGNOSIS — M1990 Unspecified osteoarthritis, unspecified site: Secondary | ICD-10-CM | POA: Diagnosis not present

## 2017-12-15 DIAGNOSIS — I10 Essential (primary) hypertension: Secondary | ICD-10-CM | POA: Diagnosis not present

## 2017-12-15 DIAGNOSIS — E6609 Other obesity due to excess calories: Secondary | ICD-10-CM | POA: Diagnosis not present

## 2017-12-15 DIAGNOSIS — E8881 Metabolic syndrome: Secondary | ICD-10-CM | POA: Diagnosis not present

## 2017-12-15 DIAGNOSIS — M5136 Other intervertebral disc degeneration, lumbar region: Secondary | ICD-10-CM | POA: Diagnosis not present

## 2017-12-17 DIAGNOSIS — K7581 Nonalcoholic steatohepatitis (NASH): Secondary | ICD-10-CM | POA: Diagnosis not present

## 2017-12-17 DIAGNOSIS — K219 Gastro-esophageal reflux disease without esophagitis: Secondary | ICD-10-CM | POA: Diagnosis not present

## 2017-12-17 DIAGNOSIS — I10 Essential (primary) hypertension: Secondary | ICD-10-CM | POA: Diagnosis not present

## 2017-12-17 DIAGNOSIS — E8881 Metabolic syndrome: Secondary | ICD-10-CM | POA: Diagnosis not present

## 2017-12-17 DIAGNOSIS — G6289 Other specified polyneuropathies: Secondary | ICD-10-CM | POA: Diagnosis not present

## 2017-12-17 DIAGNOSIS — E1122 Type 2 diabetes mellitus with diabetic chronic kidney disease: Secondary | ICD-10-CM | POA: Diagnosis not present

## 2017-12-17 DIAGNOSIS — Z1212 Encounter for screening for malignant neoplasm of rectum: Secondary | ICD-10-CM | POA: Diagnosis not present

## 2017-12-17 DIAGNOSIS — E1140 Type 2 diabetes mellitus with diabetic neuropathy, unspecified: Secondary | ICD-10-CM | POA: Diagnosis not present

## 2017-12-17 DIAGNOSIS — E039 Hypothyroidism, unspecified: Secondary | ICD-10-CM | POA: Diagnosis not present

## 2017-12-17 DIAGNOSIS — Z0000 Encounter for general adult medical examination without abnormal findings: Secondary | ICD-10-CM | POA: Diagnosis not present

## 2017-12-17 DIAGNOSIS — E782 Mixed hyperlipidemia: Secondary | ICD-10-CM | POA: Diagnosis not present

## 2017-12-17 DIAGNOSIS — Z23 Encounter for immunization: Secondary | ICD-10-CM | POA: Diagnosis not present

## 2017-12-22 DIAGNOSIS — I10 Essential (primary) hypertension: Secondary | ICD-10-CM | POA: Diagnosis not present

## 2017-12-22 DIAGNOSIS — E1142 Type 2 diabetes mellitus with diabetic polyneuropathy: Secondary | ICD-10-CM | POA: Diagnosis not present

## 2017-12-22 DIAGNOSIS — F329 Major depressive disorder, single episode, unspecified: Secondary | ICD-10-CM | POA: Diagnosis not present

## 2017-12-22 DIAGNOSIS — M5136 Other intervertebral disc degeneration, lumbar region: Secondary | ICD-10-CM | POA: Diagnosis not present

## 2017-12-22 DIAGNOSIS — M1990 Unspecified osteoarthritis, unspecified site: Secondary | ICD-10-CM | POA: Diagnosis not present

## 2017-12-23 DIAGNOSIS — R300 Dysuria: Secondary | ICD-10-CM | POA: Diagnosis not present

## 2017-12-23 DIAGNOSIS — M25561 Pain in right knee: Secondary | ICD-10-CM | POA: Diagnosis not present

## 2017-12-23 DIAGNOSIS — Z9889 Other specified postprocedural states: Secondary | ICD-10-CM | POA: Diagnosis not present

## 2017-12-23 DIAGNOSIS — M25562 Pain in left knee: Secondary | ICD-10-CM | POA: Diagnosis not present

## 2017-12-23 DIAGNOSIS — M170 Bilateral primary osteoarthritis of knee: Secondary | ICD-10-CM | POA: Diagnosis not present

## 2017-12-28 DIAGNOSIS — L851 Acquired keratosis [keratoderma] palmaris et plantaris: Secondary | ICD-10-CM | POA: Diagnosis not present

## 2017-12-28 DIAGNOSIS — B351 Tinea unguium: Secondary | ICD-10-CM | POA: Diagnosis not present

## 2017-12-28 DIAGNOSIS — E1140 Type 2 diabetes mellitus with diabetic neuropathy, unspecified: Secondary | ICD-10-CM | POA: Diagnosis not present

## 2017-12-28 DIAGNOSIS — L03032 Cellulitis of left toe: Secondary | ICD-10-CM | POA: Diagnosis not present

## 2017-12-28 DIAGNOSIS — M79673 Pain in unspecified foot: Secondary | ICD-10-CM | POA: Diagnosis not present

## 2018-01-02 DIAGNOSIS — R300 Dysuria: Secondary | ICD-10-CM | POA: Diagnosis not present

## 2018-01-06 DIAGNOSIS — E1142 Type 2 diabetes mellitus with diabetic polyneuropathy: Secondary | ICD-10-CM | POA: Diagnosis not present

## 2018-01-06 DIAGNOSIS — F329 Major depressive disorder, single episode, unspecified: Secondary | ICD-10-CM | POA: Diagnosis not present

## 2018-01-06 DIAGNOSIS — M1990 Unspecified osteoarthritis, unspecified site: Secondary | ICD-10-CM | POA: Diagnosis not present

## 2018-01-06 DIAGNOSIS — M5136 Other intervertebral disc degeneration, lumbar region: Secondary | ICD-10-CM | POA: Diagnosis not present

## 2018-01-06 DIAGNOSIS — I10 Essential (primary) hypertension: Secondary | ICD-10-CM | POA: Diagnosis not present

## 2018-01-16 DIAGNOSIS — G4761 Periodic limb movement disorder: Secondary | ICD-10-CM | POA: Diagnosis not present

## 2018-01-16 DIAGNOSIS — G4733 Obstructive sleep apnea (adult) (pediatric): Secondary | ICD-10-CM | POA: Diagnosis not present

## 2018-01-16 DIAGNOSIS — R0902 Hypoxemia: Secondary | ICD-10-CM | POA: Diagnosis not present

## 2018-01-18 DIAGNOSIS — L97509 Non-pressure chronic ulcer of other part of unspecified foot with unspecified severity: Secondary | ICD-10-CM | POA: Diagnosis not present

## 2018-01-18 DIAGNOSIS — E1140 Type 2 diabetes mellitus with diabetic neuropathy, unspecified: Secondary | ICD-10-CM | POA: Diagnosis not present

## 2018-01-22 DIAGNOSIS — T148XXA Other injury of unspecified body region, initial encounter: Secondary | ICD-10-CM | POA: Diagnosis not present

## 2018-01-22 DIAGNOSIS — M79606 Pain in leg, unspecified: Secondary | ICD-10-CM | POA: Diagnosis not present

## 2018-01-23 DIAGNOSIS — E1165 Type 2 diabetes mellitus with hyperglycemia: Secondary | ICD-10-CM | POA: Diagnosis present

## 2018-01-23 DIAGNOSIS — D509 Iron deficiency anemia, unspecified: Secondary | ICD-10-CM | POA: Diagnosis present

## 2018-01-23 DIAGNOSIS — R6510 Systemic inflammatory response syndrome (SIRS) of non-infectious origin without acute organ dysfunction: Secondary | ICD-10-CM | POA: Diagnosis not present

## 2018-01-23 DIAGNOSIS — R296 Repeated falls: Secondary | ICD-10-CM | POA: Diagnosis present

## 2018-01-23 DIAGNOSIS — L03116 Cellulitis of left lower limb: Secondary | ICD-10-CM | POA: Diagnosis not present

## 2018-01-23 DIAGNOSIS — Z888 Allergy status to other drugs, medicaments and biological substances status: Secondary | ICD-10-CM | POA: Diagnosis not present

## 2018-01-23 DIAGNOSIS — I872 Venous insufficiency (chronic) (peripheral): Secondary | ICD-10-CM | POA: Diagnosis present

## 2018-01-23 DIAGNOSIS — N3000 Acute cystitis without hematuria: Secondary | ICD-10-CM | POA: Diagnosis not present

## 2018-01-23 DIAGNOSIS — E1142 Type 2 diabetes mellitus with diabetic polyneuropathy: Secondary | ICD-10-CM | POA: Diagnosis present

## 2018-01-23 DIAGNOSIS — G4733 Obstructive sleep apnea (adult) (pediatric): Secondary | ICD-10-CM | POA: Diagnosis present

## 2018-01-23 DIAGNOSIS — N390 Urinary tract infection, site not specified: Secondary | ICD-10-CM | POA: Diagnosis not present

## 2018-01-23 DIAGNOSIS — A4150 Gram-negative sepsis, unspecified: Secondary | ICD-10-CM | POA: Diagnosis not present

## 2018-01-23 DIAGNOSIS — L03115 Cellulitis of right lower limb: Secondary | ICD-10-CM | POA: Diagnosis not present

## 2018-01-23 DIAGNOSIS — R739 Hyperglycemia, unspecified: Secondary | ICD-10-CM | POA: Diagnosis not present

## 2018-01-23 DIAGNOSIS — I10 Essential (primary) hypertension: Secondary | ICD-10-CM | POA: Diagnosis present

## 2018-01-23 DIAGNOSIS — Z794 Long term (current) use of insulin: Secondary | ICD-10-CM | POA: Diagnosis not present

## 2018-01-23 DIAGNOSIS — R0902 Hypoxemia: Secondary | ICD-10-CM | POA: Diagnosis present

## 2018-01-23 DIAGNOSIS — M170 Bilateral primary osteoarthritis of knee: Secondary | ICD-10-CM | POA: Diagnosis present

## 2018-01-23 DIAGNOSIS — B9620 Unspecified Escherichia coli [E. coli] as the cause of diseases classified elsewhere: Secondary | ICD-10-CM | POA: Diagnosis present

## 2018-01-23 DIAGNOSIS — K219 Gastro-esophageal reflux disease without esophagitis: Secondary | ICD-10-CM | POA: Diagnosis present

## 2018-01-23 DIAGNOSIS — Z79899 Other long term (current) drug therapy: Secondary | ICD-10-CM | POA: Diagnosis not present

## 2018-01-23 DIAGNOSIS — E039 Hypothyroidism, unspecified: Secondary | ICD-10-CM | POA: Diagnosis present

## 2018-01-23 DIAGNOSIS — Z6841 Body Mass Index (BMI) 40.0 and over, adult: Secondary | ICD-10-CM | POA: Diagnosis not present

## 2018-01-23 DIAGNOSIS — Z9181 History of falling: Secondary | ICD-10-CM | POA: Diagnosis not present

## 2018-01-23 DIAGNOSIS — Z882 Allergy status to sulfonamides status: Secondary | ICD-10-CM | POA: Diagnosis not present

## 2018-01-23 DIAGNOSIS — M6281 Muscle weakness (generalized): Secondary | ICD-10-CM | POA: Diagnosis not present

## 2018-01-23 DIAGNOSIS — F339 Major depressive disorder, recurrent, unspecified: Secondary | ICD-10-CM | POA: Diagnosis not present

## 2018-01-23 DIAGNOSIS — E11628 Type 2 diabetes mellitus with other skin complications: Secondary | ICD-10-CM | POA: Diagnosis not present

## 2018-01-23 DIAGNOSIS — M5136 Other intervertebral disc degeneration, lumbar region: Secondary | ICD-10-CM | POA: Diagnosis present

## 2018-01-27 DIAGNOSIS — G4733 Obstructive sleep apnea (adult) (pediatric): Secondary | ICD-10-CM | POA: Diagnosis not present

## 2018-01-27 DIAGNOSIS — N390 Urinary tract infection, site not specified: Secondary | ICD-10-CM | POA: Diagnosis not present

## 2018-01-27 DIAGNOSIS — M170 Bilateral primary osteoarthritis of knee: Secondary | ICD-10-CM | POA: Diagnosis not present

## 2018-01-27 DIAGNOSIS — E039 Hypothyroidism, unspecified: Secondary | ICD-10-CM | POA: Diagnosis not present

## 2018-01-27 DIAGNOSIS — R296 Repeated falls: Secondary | ICD-10-CM | POA: Diagnosis not present

## 2018-01-27 DIAGNOSIS — M5136 Other intervertebral disc degeneration, lumbar region: Secondary | ICD-10-CM | POA: Diagnosis not present

## 2018-01-27 DIAGNOSIS — Z794 Long term (current) use of insulin: Secondary | ICD-10-CM | POA: Diagnosis not present

## 2018-01-27 DIAGNOSIS — B9620 Unspecified Escherichia coli [E. coli] as the cause of diseases classified elsewhere: Secondary | ICD-10-CM | POA: Diagnosis not present

## 2018-01-27 DIAGNOSIS — A4150 Gram-negative sepsis, unspecified: Secondary | ICD-10-CM | POA: Diagnosis not present

## 2018-01-27 DIAGNOSIS — F339 Major depressive disorder, recurrent, unspecified: Secondary | ICD-10-CM | POA: Diagnosis not present

## 2018-01-27 DIAGNOSIS — K219 Gastro-esophageal reflux disease without esophagitis: Secondary | ICD-10-CM | POA: Diagnosis not present

## 2018-01-27 DIAGNOSIS — I10 Essential (primary) hypertension: Secondary | ICD-10-CM | POA: Diagnosis not present

## 2018-01-27 DIAGNOSIS — L03115 Cellulitis of right lower limb: Secondary | ICD-10-CM | POA: Diagnosis not present

## 2018-01-27 DIAGNOSIS — I872 Venous insufficiency (chronic) (peripheral): Secondary | ICD-10-CM | POA: Diagnosis not present

## 2018-01-27 DIAGNOSIS — E1142 Type 2 diabetes mellitus with diabetic polyneuropathy: Secondary | ICD-10-CM | POA: Diagnosis not present

## 2018-01-27 DIAGNOSIS — R6510 Systemic inflammatory response syndrome (SIRS) of non-infectious origin without acute organ dysfunction: Secondary | ICD-10-CM | POA: Diagnosis not present

## 2018-01-27 DIAGNOSIS — L03116 Cellulitis of left lower limb: Secondary | ICD-10-CM | POA: Diagnosis not present

## 2018-01-27 DIAGNOSIS — R0902 Hypoxemia: Secondary | ICD-10-CM | POA: Diagnosis not present

## 2018-01-27 DIAGNOSIS — M6281 Muscle weakness (generalized): Secondary | ICD-10-CM | POA: Diagnosis not present

## 2018-01-27 DIAGNOSIS — Z9181 History of falling: Secondary | ICD-10-CM | POA: Diagnosis not present

## 2018-01-31 DIAGNOSIS — A4150 Gram-negative sepsis, unspecified: Secondary | ICD-10-CM | POA: Diagnosis not present

## 2018-01-31 DIAGNOSIS — R296 Repeated falls: Secondary | ICD-10-CM | POA: Diagnosis not present

## 2018-02-13 DIAGNOSIS — A4150 Gram-negative sepsis, unspecified: Secondary | ICD-10-CM | POA: Diagnosis not present

## 2018-02-13 DIAGNOSIS — R296 Repeated falls: Secondary | ICD-10-CM | POA: Diagnosis not present

## 2018-03-04 DIAGNOSIS — Z87440 Personal history of urinary (tract) infections: Secondary | ICD-10-CM | POA: Diagnosis not present

## 2018-03-04 DIAGNOSIS — L97521 Non-pressure chronic ulcer of other part of left foot limited to breakdown of skin: Secondary | ICD-10-CM | POA: Diagnosis not present

## 2018-03-04 DIAGNOSIS — G4733 Obstructive sleep apnea (adult) (pediatric): Secondary | ICD-10-CM | POA: Diagnosis not present

## 2018-03-04 DIAGNOSIS — Z794 Long term (current) use of insulin: Secondary | ICD-10-CM | POA: Diagnosis not present

## 2018-03-04 DIAGNOSIS — I10 Essential (primary) hypertension: Secondary | ICD-10-CM | POA: Diagnosis not present

## 2018-03-04 DIAGNOSIS — R296 Repeated falls: Secondary | ICD-10-CM | POA: Diagnosis not present

## 2018-03-04 DIAGNOSIS — Z79899 Other long term (current) drug therapy: Secondary | ICD-10-CM | POA: Diagnosis not present

## 2018-03-04 DIAGNOSIS — M170 Bilateral primary osteoarthritis of knee: Secondary | ICD-10-CM | POA: Diagnosis not present

## 2018-03-04 DIAGNOSIS — I872 Venous insufficiency (chronic) (peripheral): Secondary | ICD-10-CM | POA: Diagnosis not present

## 2018-03-04 DIAGNOSIS — M5136 Other intervertebral disc degeneration, lumbar region: Secondary | ICD-10-CM | POA: Diagnosis not present

## 2018-03-04 DIAGNOSIS — D509 Iron deficiency anemia, unspecified: Secondary | ICD-10-CM | POA: Diagnosis not present

## 2018-03-04 DIAGNOSIS — Z6838 Body mass index (BMI) 38.0-38.9, adult: Secondary | ICD-10-CM | POA: Diagnosis not present

## 2018-03-04 DIAGNOSIS — F331 Major depressive disorder, recurrent, moderate: Secondary | ICD-10-CM | POA: Diagnosis not present

## 2018-03-04 DIAGNOSIS — Z9181 History of falling: Secondary | ICD-10-CM | POA: Diagnosis not present

## 2018-03-04 DIAGNOSIS — E1142 Type 2 diabetes mellitus with diabetic polyneuropathy: Secondary | ICD-10-CM | POA: Diagnosis not present

## 2018-03-04 DIAGNOSIS — Z4800 Encounter for change or removal of nonsurgical wound dressing: Secondary | ICD-10-CM | POA: Diagnosis not present

## 2018-03-04 DIAGNOSIS — K219 Gastro-esophageal reflux disease without esophagitis: Secondary | ICD-10-CM | POA: Diagnosis not present

## 2018-03-08 DIAGNOSIS — I872 Venous insufficiency (chronic) (peripheral): Secondary | ICD-10-CM | POA: Diagnosis not present

## 2018-03-08 DIAGNOSIS — E1142 Type 2 diabetes mellitus with diabetic polyneuropathy: Secondary | ICD-10-CM | POA: Diagnosis not present

## 2018-03-08 DIAGNOSIS — Z87440 Personal history of urinary (tract) infections: Secondary | ICD-10-CM | POA: Diagnosis not present

## 2018-03-08 DIAGNOSIS — L97509 Non-pressure chronic ulcer of other part of unspecified foot with unspecified severity: Secondary | ICD-10-CM | POA: Diagnosis not present

## 2018-03-08 DIAGNOSIS — L97521 Non-pressure chronic ulcer of other part of left foot limited to breakdown of skin: Secondary | ICD-10-CM | POA: Diagnosis not present

## 2018-03-08 DIAGNOSIS — E1140 Type 2 diabetes mellitus with diabetic neuropathy, unspecified: Secondary | ICD-10-CM | POA: Diagnosis not present

## 2018-03-08 DIAGNOSIS — R296 Repeated falls: Secondary | ICD-10-CM | POA: Diagnosis not present

## 2018-03-08 DIAGNOSIS — M170 Bilateral primary osteoarthritis of knee: Secondary | ICD-10-CM | POA: Diagnosis not present

## 2018-03-08 DIAGNOSIS — B351 Tinea unguium: Secondary | ICD-10-CM | POA: Diagnosis not present

## 2018-03-09 DIAGNOSIS — M170 Bilateral primary osteoarthritis of knee: Secondary | ICD-10-CM | POA: Diagnosis not present

## 2018-03-09 DIAGNOSIS — E1142 Type 2 diabetes mellitus with diabetic polyneuropathy: Secondary | ICD-10-CM | POA: Diagnosis not present

## 2018-03-09 DIAGNOSIS — Z87440 Personal history of urinary (tract) infections: Secondary | ICD-10-CM | POA: Diagnosis not present

## 2018-03-09 DIAGNOSIS — I872 Venous insufficiency (chronic) (peripheral): Secondary | ICD-10-CM | POA: Diagnosis not present

## 2018-03-09 DIAGNOSIS — R296 Repeated falls: Secondary | ICD-10-CM | POA: Diagnosis not present

## 2018-03-09 DIAGNOSIS — L97521 Non-pressure chronic ulcer of other part of left foot limited to breakdown of skin: Secondary | ICD-10-CM | POA: Diagnosis not present

## 2018-03-10 DIAGNOSIS — R296 Repeated falls: Secondary | ICD-10-CM | POA: Diagnosis not present

## 2018-03-10 DIAGNOSIS — E1142 Type 2 diabetes mellitus with diabetic polyneuropathy: Secondary | ICD-10-CM | POA: Diagnosis not present

## 2018-03-10 DIAGNOSIS — Z87440 Personal history of urinary (tract) infections: Secondary | ICD-10-CM | POA: Diagnosis not present

## 2018-03-10 DIAGNOSIS — M170 Bilateral primary osteoarthritis of knee: Secondary | ICD-10-CM | POA: Diagnosis not present

## 2018-03-10 DIAGNOSIS — I872 Venous insufficiency (chronic) (peripheral): Secondary | ICD-10-CM | POA: Diagnosis not present

## 2018-03-10 DIAGNOSIS — L97521 Non-pressure chronic ulcer of other part of left foot limited to breakdown of skin: Secondary | ICD-10-CM | POA: Diagnosis not present

## 2018-03-11 DIAGNOSIS — W1839XA Other fall on same level, initial encounter: Secondary | ICD-10-CM | POA: Diagnosis not present

## 2018-03-11 DIAGNOSIS — E039 Hypothyroidism, unspecified: Secondary | ICD-10-CM | POA: Diagnosis not present

## 2018-03-11 DIAGNOSIS — Z79899 Other long term (current) drug therapy: Secondary | ICD-10-CM | POA: Diagnosis not present

## 2018-03-11 DIAGNOSIS — S0990XA Unspecified injury of head, initial encounter: Secondary | ICD-10-CM | POA: Diagnosis not present

## 2018-03-11 DIAGNOSIS — Z794 Long term (current) use of insulin: Secondary | ICD-10-CM | POA: Diagnosis not present

## 2018-03-11 DIAGNOSIS — K219 Gastro-esophageal reflux disease without esophagitis: Secondary | ICD-10-CM | POA: Diagnosis not present

## 2018-03-11 DIAGNOSIS — E1142 Type 2 diabetes mellitus with diabetic polyneuropathy: Secondary | ICD-10-CM | POA: Diagnosis not present

## 2018-03-11 DIAGNOSIS — S300XXA Contusion of lower back and pelvis, initial encounter: Secondary | ICD-10-CM | POA: Diagnosis not present

## 2018-03-11 DIAGNOSIS — N390 Urinary tract infection, site not specified: Secondary | ICD-10-CM | POA: Diagnosis not present

## 2018-03-11 DIAGNOSIS — I872 Venous insufficiency (chronic) (peripheral): Secondary | ICD-10-CM | POA: Diagnosis not present

## 2018-03-11 DIAGNOSIS — L97521 Non-pressure chronic ulcer of other part of left foot limited to breakdown of skin: Secondary | ICD-10-CM | POA: Diagnosis not present

## 2018-03-11 DIAGNOSIS — R296 Repeated falls: Secondary | ICD-10-CM | POA: Diagnosis not present

## 2018-03-11 DIAGNOSIS — R531 Weakness: Secondary | ICD-10-CM | POA: Diagnosis not present

## 2018-03-11 DIAGNOSIS — R404 Transient alteration of awareness: Secondary | ICD-10-CM | POA: Diagnosis not present

## 2018-03-11 DIAGNOSIS — Z87440 Personal history of urinary (tract) infections: Secondary | ICD-10-CM | POA: Diagnosis not present

## 2018-03-11 DIAGNOSIS — S3992XA Unspecified injury of lower back, initial encounter: Secondary | ICD-10-CM | POA: Diagnosis not present

## 2018-03-11 DIAGNOSIS — M533 Sacrococcygeal disorders, not elsewhere classified: Secondary | ICD-10-CM | POA: Diagnosis not present

## 2018-03-11 DIAGNOSIS — M170 Bilateral primary osteoarthritis of knee: Secondary | ICD-10-CM | POA: Diagnosis not present

## 2018-03-11 DIAGNOSIS — E119 Type 2 diabetes mellitus without complications: Secondary | ICD-10-CM | POA: Diagnosis not present

## 2018-03-11 DIAGNOSIS — S0003XA Contusion of scalp, initial encounter: Secondary | ICD-10-CM | POA: Diagnosis not present

## 2018-03-11 DIAGNOSIS — I10 Essential (primary) hypertension: Secondary | ICD-10-CM | POA: Diagnosis not present

## 2018-03-11 DIAGNOSIS — Z87891 Personal history of nicotine dependence: Secondary | ICD-10-CM | POA: Diagnosis not present

## 2018-03-15 DIAGNOSIS — E1142 Type 2 diabetes mellitus with diabetic polyneuropathy: Secondary | ICD-10-CM | POA: Diagnosis not present

## 2018-03-15 DIAGNOSIS — Z87440 Personal history of urinary (tract) infections: Secondary | ICD-10-CM | POA: Diagnosis not present

## 2018-03-15 DIAGNOSIS — I872 Venous insufficiency (chronic) (peripheral): Secondary | ICD-10-CM | POA: Diagnosis not present

## 2018-03-15 DIAGNOSIS — R296 Repeated falls: Secondary | ICD-10-CM | POA: Diagnosis not present

## 2018-03-15 DIAGNOSIS — L97521 Non-pressure chronic ulcer of other part of left foot limited to breakdown of skin: Secondary | ICD-10-CM | POA: Diagnosis not present

## 2018-03-15 DIAGNOSIS — M170 Bilateral primary osteoarthritis of knee: Secondary | ICD-10-CM | POA: Diagnosis not present

## 2018-03-16 DIAGNOSIS — Z87440 Personal history of urinary (tract) infections: Secondary | ICD-10-CM | POA: Diagnosis not present

## 2018-03-16 DIAGNOSIS — E1142 Type 2 diabetes mellitus with diabetic polyneuropathy: Secondary | ICD-10-CM | POA: Diagnosis not present

## 2018-03-16 DIAGNOSIS — L97521 Non-pressure chronic ulcer of other part of left foot limited to breakdown of skin: Secondary | ICD-10-CM | POA: Diagnosis not present

## 2018-03-16 DIAGNOSIS — M170 Bilateral primary osteoarthritis of knee: Secondary | ICD-10-CM | POA: Diagnosis not present

## 2018-03-16 DIAGNOSIS — R296 Repeated falls: Secondary | ICD-10-CM | POA: Diagnosis not present

## 2018-03-16 DIAGNOSIS — I872 Venous insufficiency (chronic) (peripheral): Secondary | ICD-10-CM | POA: Diagnosis not present

## 2018-03-18 DIAGNOSIS — E782 Mixed hyperlipidemia: Secondary | ICD-10-CM | POA: Diagnosis not present

## 2018-03-18 DIAGNOSIS — Z6837 Body mass index (BMI) 37.0-37.9, adult: Secondary | ICD-10-CM | POA: Diagnosis not present

## 2018-03-18 DIAGNOSIS — Z87440 Personal history of urinary (tract) infections: Secondary | ICD-10-CM | POA: Diagnosis not present

## 2018-03-18 DIAGNOSIS — I10 Essential (primary) hypertension: Secondary | ICD-10-CM | POA: Diagnosis not present

## 2018-03-18 DIAGNOSIS — L97521 Non-pressure chronic ulcer of other part of left foot limited to breakdown of skin: Secondary | ICD-10-CM | POA: Diagnosis not present

## 2018-03-18 DIAGNOSIS — I872 Venous insufficiency (chronic) (peripheral): Secondary | ICD-10-CM | POA: Diagnosis not present

## 2018-03-18 DIAGNOSIS — E6609 Other obesity due to excess calories: Secondary | ICD-10-CM | POA: Diagnosis not present

## 2018-03-18 DIAGNOSIS — E1122 Type 2 diabetes mellitus with diabetic chronic kidney disease: Secondary | ICD-10-CM | POA: Diagnosis not present

## 2018-03-18 DIAGNOSIS — R300 Dysuria: Secondary | ICD-10-CM | POA: Diagnosis not present

## 2018-03-18 DIAGNOSIS — R296 Repeated falls: Secondary | ICD-10-CM | POA: Diagnosis not present

## 2018-03-18 DIAGNOSIS — E1142 Type 2 diabetes mellitus with diabetic polyneuropathy: Secondary | ICD-10-CM | POA: Diagnosis not present

## 2018-03-18 DIAGNOSIS — E1140 Type 2 diabetes mellitus with diabetic neuropathy, unspecified: Secondary | ICD-10-CM | POA: Diagnosis not present

## 2018-03-18 DIAGNOSIS — M170 Bilateral primary osteoarthritis of knee: Secondary | ICD-10-CM | POA: Diagnosis not present

## 2018-03-18 DIAGNOSIS — E039 Hypothyroidism, unspecified: Secondary | ICD-10-CM | POA: Diagnosis not present

## 2018-03-19 ENCOUNTER — Other Ambulatory Visit: Payer: Self-pay

## 2018-03-19 DIAGNOSIS — M170 Bilateral primary osteoarthritis of knee: Secondary | ICD-10-CM | POA: Diagnosis not present

## 2018-03-19 DIAGNOSIS — L97521 Non-pressure chronic ulcer of other part of left foot limited to breakdown of skin: Secondary | ICD-10-CM | POA: Diagnosis not present

## 2018-03-19 DIAGNOSIS — I872 Venous insufficiency (chronic) (peripheral): Secondary | ICD-10-CM | POA: Diagnosis not present

## 2018-03-19 DIAGNOSIS — R296 Repeated falls: Secondary | ICD-10-CM | POA: Diagnosis not present

## 2018-03-19 DIAGNOSIS — E1142 Type 2 diabetes mellitus with diabetic polyneuropathy: Secondary | ICD-10-CM | POA: Diagnosis not present

## 2018-03-19 DIAGNOSIS — Z87440 Personal history of urinary (tract) infections: Secondary | ICD-10-CM | POA: Diagnosis not present

## 2018-03-19 NOTE — Patient Outreach (Signed)
[facility]) Care Management  03/19/2018  Kathryn Ryan 08/17/1941 [id number]   Referral Date: 4/112/19 Referral Source: MD Referral Date of Admission: 01/26/18 Date of Discharge: 03/02/18 Facility:  [facility] Insurance:  Medicare  Outreach attempt # 1 Spoke with patient.  She is able to verify HIPAA.  Patient reports she is doing ok.  Discussed reason for referral. She states that Dr. Quillian Quince really did not seem concerned about her diabetes but was thrilled with her weight loss.  Patient admits to multiple falls and after questioning patient found that she was recently discharged from [facility]. Patient currently has advanced home care for therapy per patient.  Patient has also had multiple falls since leaving [facility]. Patient states she cannot count them all up.    Social: Patient lives alone with no support per patient. She has a brother who lives in [place] who she talks with on the phone and a son who lives in [place], [place].  Patient independent with care and still drives per patient.  She also wears a medical alert at all times per patient.  Conditions: Patient admits to diabetes, HTN,neuropathy, and increased cholesterol.  Patient reports that her sugars run from 79-150 range.  Her last A1c was 6.9.    Medications: Patient able to review her medications.  Patient has no questions concerning her medications.    Appointments: Patient saw Dr. Quillian Quince for a post rehab visit on yesterday.  Consent: RN CM reviewed [facility] services with patient.  Patient agreeable to community nurse for transition of care and multiple falls.  Patient also agreeable to social work to help with assistance in the home  Plan: RN CM will refer to community nurse for transition of care and multiple falls.   RN CM will refer to social work to help with assistance in the home.    Jone Baseman, RN, MSN [facility] Care Management Care Management  Coordinator Direct Line [phone] Toll Free: [phone]  Fax: [phone]

## 2018-03-22 ENCOUNTER — Other Ambulatory Visit: Payer: Self-pay | Admitting: *Deleted

## 2018-03-22 DIAGNOSIS — M170 Bilateral primary osteoarthritis of knee: Secondary | ICD-10-CM | POA: Diagnosis not present

## 2018-03-22 DIAGNOSIS — E1142 Type 2 diabetes mellitus with diabetic polyneuropathy: Secondary | ICD-10-CM | POA: Diagnosis not present

## 2018-03-22 DIAGNOSIS — Z87440 Personal history of urinary (tract) infections: Secondary | ICD-10-CM | POA: Diagnosis not present

## 2018-03-22 DIAGNOSIS — L97521 Non-pressure chronic ulcer of other part of left foot limited to breakdown of skin: Secondary | ICD-10-CM | POA: Diagnosis not present

## 2018-03-22 DIAGNOSIS — R296 Repeated falls: Secondary | ICD-10-CM | POA: Diagnosis not present

## 2018-03-22 DIAGNOSIS — I872 Venous insufficiency (chronic) (peripheral): Secondary | ICD-10-CM | POA: Diagnosis not present

## 2018-03-22 NOTE — Patient Outreach (Signed)
Telephone call to pt to schedule initial home visit, spoke with pt, HIPAA verified, initial home visit scheduled for next week. Pt prefers afternoon visits.  PLAN See pt for initial home visit next week  Jacqlyn Larsen [facility], [place] Coordinator [phone]

## 2018-03-23 DIAGNOSIS — E1142 Type 2 diabetes mellitus with diabetic polyneuropathy: Secondary | ICD-10-CM | POA: Diagnosis not present

## 2018-03-23 DIAGNOSIS — M170 Bilateral primary osteoarthritis of knee: Secondary | ICD-10-CM | POA: Diagnosis not present

## 2018-03-23 DIAGNOSIS — I872 Venous insufficiency (chronic) (peripheral): Secondary | ICD-10-CM | POA: Diagnosis not present

## 2018-03-23 DIAGNOSIS — Z87440 Personal history of urinary (tract) infections: Secondary | ICD-10-CM | POA: Diagnosis not present

## 2018-03-23 DIAGNOSIS — R296 Repeated falls: Secondary | ICD-10-CM | POA: Diagnosis not present

## 2018-03-23 DIAGNOSIS — L97521 Non-pressure chronic ulcer of other part of left foot limited to breakdown of skin: Secondary | ICD-10-CM | POA: Diagnosis not present

## 2018-03-25 DIAGNOSIS — M170 Bilateral primary osteoarthritis of knee: Secondary | ICD-10-CM | POA: Diagnosis not present

## 2018-03-25 DIAGNOSIS — I872 Venous insufficiency (chronic) (peripheral): Secondary | ICD-10-CM | POA: Diagnosis not present

## 2018-03-25 DIAGNOSIS — E1142 Type 2 diabetes mellitus with diabetic polyneuropathy: Secondary | ICD-10-CM | POA: Diagnosis not present

## 2018-03-25 DIAGNOSIS — Z87440 Personal history of urinary (tract) infections: Secondary | ICD-10-CM | POA: Diagnosis not present

## 2018-03-25 DIAGNOSIS — L97521 Non-pressure chronic ulcer of other part of left foot limited to breakdown of skin: Secondary | ICD-10-CM | POA: Diagnosis not present

## 2018-03-25 DIAGNOSIS — R296 Repeated falls: Secondary | ICD-10-CM | POA: Diagnosis not present

## 2018-03-26 ENCOUNTER — Other Ambulatory Visit: Payer: Self-pay

## 2018-03-26 ENCOUNTER — Other Ambulatory Visit: Payer: Self-pay | Admitting: *Deleted

## 2018-03-26 DIAGNOSIS — I872 Venous insufficiency (chronic) (peripheral): Secondary | ICD-10-CM | POA: Diagnosis not present

## 2018-03-26 DIAGNOSIS — M170 Bilateral primary osteoarthritis of knee: Secondary | ICD-10-CM | POA: Diagnosis not present

## 2018-03-26 DIAGNOSIS — Z87440 Personal history of urinary (tract) infections: Secondary | ICD-10-CM | POA: Diagnosis not present

## 2018-03-26 DIAGNOSIS — L97521 Non-pressure chronic ulcer of other part of left foot limited to breakdown of skin: Secondary | ICD-10-CM | POA: Diagnosis not present

## 2018-03-26 DIAGNOSIS — R296 Repeated falls: Secondary | ICD-10-CM | POA: Diagnosis not present

## 2018-03-26 DIAGNOSIS — E1142 Type 2 diabetes mellitus with diabetic polyneuropathy: Secondary | ICD-10-CM | POA: Diagnosis not present

## 2018-03-26 NOTE — Patient Outreach (Signed)
Telephone call to pt for transition of care week 2, no answer to telephone and no option to leave voicemail.  PLAN See pt for initial home visit next week  Jacqlyn Larsen [facility], [place] Coordinator [phone]

## 2018-03-26 NOTE — Patient Outreach (Signed)
[facility]) Care Management  03/26/2018  Kathryn Ryan 1941/01/22 [id number]   SW called patient regarding social work referral received on 03/19/18.  SW informed patient that she will accompany CMRN to initial home visit/assessment scheduled for 04/01/18 in order to determine social work needs.   Ronn Melena, BSW Social Worker [phone]

## 2018-03-30 DIAGNOSIS — E1142 Type 2 diabetes mellitus with diabetic polyneuropathy: Secondary | ICD-10-CM | POA: Diagnosis not present

## 2018-03-30 DIAGNOSIS — M170 Bilateral primary osteoarthritis of knee: Secondary | ICD-10-CM | POA: Diagnosis not present

## 2018-03-30 DIAGNOSIS — Z87440 Personal history of urinary (tract) infections: Secondary | ICD-10-CM | POA: Diagnosis not present

## 2018-03-30 DIAGNOSIS — R296 Repeated falls: Secondary | ICD-10-CM | POA: Diagnosis not present

## 2018-03-30 DIAGNOSIS — I872 Venous insufficiency (chronic) (peripheral): Secondary | ICD-10-CM | POA: Diagnosis not present

## 2018-03-30 DIAGNOSIS — L97521 Non-pressure chronic ulcer of other part of left foot limited to breakdown of skin: Secondary | ICD-10-CM | POA: Diagnosis not present

## 2018-04-01 ENCOUNTER — Other Ambulatory Visit: Payer: Self-pay

## 2018-04-01 ENCOUNTER — Other Ambulatory Visit: Payer: Self-pay | Admitting: *Deleted

## 2018-04-01 ENCOUNTER — Encounter: Payer: Self-pay | Admitting: *Deleted

## 2018-04-01 DIAGNOSIS — R296 Repeated falls: Secondary | ICD-10-CM | POA: Diagnosis not present

## 2018-04-01 DIAGNOSIS — M170 Bilateral primary osteoarthritis of knee: Secondary | ICD-10-CM | POA: Diagnosis not present

## 2018-04-01 DIAGNOSIS — E1142 Type 2 diabetes mellitus with diabetic polyneuropathy: Secondary | ICD-10-CM | POA: Diagnosis not present

## 2018-04-01 DIAGNOSIS — I872 Venous insufficiency (chronic) (peripheral): Secondary | ICD-10-CM | POA: Diagnosis not present

## 2018-04-01 DIAGNOSIS — Z87440 Personal history of urinary (tract) infections: Secondary | ICD-10-CM | POA: Diagnosis not present

## 2018-04-01 DIAGNOSIS — L97521 Non-pressure chronic ulcer of other part of left foot limited to breakdown of skin: Secondary | ICD-10-CM | POA: Diagnosis not present

## 2018-04-01 NOTE — Patient Outreach (Signed)
[place] [facility]) Care Management   04/01/2018  Kathryn Ryan 09-17-1941 [id number]  Kathryn Ryan is an 77 y.o. female  Subjective: Initial home visit with pt, HIPAA verified, pt reports she has had approximately 40 falls in the past year, pt states she has had home health PT in the past, uses walker and other assistive devices but not at all times, Pt sleeps late until after lunch and " eats when I get ready"  Pt states she does not check CBG daily and does not record.  Objective:   Vitals:   04/01/18 1324  BP: 118/60  Pulse: (!) 106  Resp: 18  SpO2: 97%  Weight: 223 lb (101.2 kg)  Height: 1.676 m ('5\' 6"' )   ROS  Physical Exam  Constitutional: She is oriented to person, place, and time. She appears well-developed and well-nourished.  HENT:  Head: Normocephalic.  Neck: Normal range of motion. Neck supple.  Cardiovascular: Regular rhythm.  HR 106  Respiratory: Effort normal and breath sounds normal.  GI: Soft. Bowel sounds are normal.  Musculoskeletal: Normal range of motion. She exhibits edema.  Dependent edema lower extremities bil  Neurological: She is alert and oriented to person, place, and time.  Skin: Skin is warm and dry.  Pt has ulcer to left great toe, dressing not intact, wound bed pink/ whitish in color.  Psychiatric: She has a normal mood and affect. Her behavior is normal. Thought content normal.    Encounter Medications:   Outpatient Encounter Medications as of 04/01/2018  Medication Sig Note  . busPIRone (BUSPAR) 15 MG tablet Take 15 mg by mouth 2 (two) times daily.   . clonazePAM (KLONOPIN) 1 MG tablet Take 1 mg by mouth 3 (three) times daily as needed.   Mariane Baumgarten Sodium (COLACE PO) Take 2 capsules by mouth daily as needed.    . DULoxetine (CYMBALTA) 60 MG capsule Take 60 mg by mouth 2 (two) times daily.   Marland Kitchen ibuprofen (ADVIL,MOTRIN) 200 MG tablet Take 200 mg by mouth every 6 (six) hours as needed.   . insulin glargine (LANTUS) 100  UNIT/ML injection Inject 120 Units into the skin at bedtime. 03/19/2018: Patient taking a total of 160 units at bedtime.  Marland Kitchen levothyroxine (SYNTHROID, LEVOTHROID) 125 MCG tablet Take 1 tablet by mouth daily. 03/19/2018: Patient taking 150 mcg QD.  Marland Kitchen losartan-hydrochlorothiazide (HYZAAR) 100-12.5 MG tablet Take 1 tablet by mouth daily. 07/14/2016: Received from: External Pharmacy  . metFORMIN (GLUCOPHAGE-XR) 500 MG 24 hr tablet Take 500 mg by mouth daily. 07/14/2016: Received from: [place]: metformin ER 500 mg tablet,extended release 24 hr 1 Tablet(s) PO daily  . metFORMIN (GLUCOPHAGE-XR) 500 MG 24 hr tablet Take 500 mg by mouth daily.   . pregabalin (LYRICA) 150 MG capsule Take 300 mg by mouth 2 (two) times daily.  03/19/2018: Patient taking 200 mg BID  . RABEprazole (ACIPHEX) 20 MG tablet Take 20 mg by mouth daily.    No facility-administered encounter medications on file as of 04/01/2018.     Functional Status:   In your present state of health, do you have any difficulty performing the following activities: 04/01/2018  Hearing? N  Vision? N  Difficulty concentrating or making decisions? N  Walking or climbing stairs? Y  Dressing or bathing? N  Doing errands, shopping? N  Preparing Food and eating ? N  Using the Toilet? N  In the past six months, have you accidently leaked urine? Y  Do you have  problems with loss of bowel control? N  Managing your Medications? N  Managing your Finances? N  Housekeeping or managing your Housekeeping? Y  Some recent data might be hidden    Fall/Depression Screening:    Fall Risk  04/01/2018 05/22/2014  Falls in the past year? Yes No  Number falls in past yr: 2 or more -  Injury with Fall? Yes -  Risk Factor Category  High Fall Risk -  Risk for fall due to : History of fall(s);Medication side effect Impaired balance/gait  Follow up Falls evaluation completed;Education provided;Falls prevention discussed -   PHQ 2/9 Scores  04/01/2018 03/19/2018 05/22/2014  PHQ - 2 Score 0 0 0    Assessment:  RN CM reviewed medications with pt. Pt is taking 3 buspar at night instead of taking TID, RN CM ask pt to take medication as prescribed. RN CM called Dr. Arcola Jansky office, spoke with Marolyn Hammock, reported HR today 106 and pt states " it's high like that all the time", faxed initial home visit and barrier letter to Dr. Olena Heckle.  During visit pt reported to RN CM that she does not have PT and at end of visit home health PT came in and states she has been working with pt for awhile now and pt has shown improvement (as pt was falling daily) PT informed that pt also has home health RN assisting with wound care,  Pt to follow up with podiatrist 04/05/18.  Pt made it clear that she does not want another level of care at this time and wishes to remain in her home as long as possible, patient's son does not live nearby.  Pt has in home care 2 days per week/ several hours per day.  Pt is wearing Life Alert bracelet.  THN CM Care Plan Problem One     Most Recent Value  Care Plan Problem One  Pt high risk for falls  Role Documenting the Problem One  Care Management [place] for Problem One  Active  THN Long Term Goal   Pt will demonstrate improved self care for falls and diabetes management within 60 days  THN Long Term Goal Start Date  04/01/18  Interventions for Problem One Long Term Goal  RN CM gave [facility] calendar, EMMI handouts and reviewed with pt , gave 24 hour nurse line magnet, completed falls risk assessment, reviewed safety precautions  THN CM Short Term Goal #1   pt will work with PT and complete prescribed exercises to increase endurance within 30 days  THN CM Short Term Goal #1 Start Date  04/01/18  Interventions for Short Term Goal #1  RN CM met with home health PT at patient's home, plan of care is to work on strengthening, endurance to prevent falls, PT states pt does not always use her walker as she should.  THN CM Short  Term Goal #2   Pt will verbalize/ demonstrate safety precautions within 30 days  THN CM Short Term Goal #2 Start Date  04/01/18  Interventions for Short Term Goal #2  RN CM reviewed safety precautions (keeping pathways clear, always use walker, be mindful of area rugs, always ask for assistance as needed, do not ambulate if dizzy)    [facility] CM Care Plan Problem Two     Most Recent Value  Care Plan Problem Two  Knowledge deficit related to diabetes  Role Documenting the Problem Two  Care Management Coordinator  Care Plan for Problem Two  Active  [facility]  CM Short Term Goal #1   pt will check CBG daily and record in [facility] calendar  [facility] CM Short Term Goal #1 Start Date  04/01/18  Interventions for Short Term Goal #2   RN CM ask pt to record CBG every day and record, take log to MD appointments  THN CM Short Term Goal #2   Pt will verbalize/ utilize plate method within 30 days  THN CM Short Term Goal #2 Start Date  04/01/18  Interventions for Short Term Goal #2  RN CM reviewed plate method and carbohydrate counting with pt, reviewed foods high in carbs to be mindful of and incorporate with meal planning approximately 3 carbs per meal    THN CM Care Plan Problem Three     Most Recent Value  Care Plan Problem Three  Ulcer to left great toe  Role Documenting the Problem Three  Care Management Coordinator  Care Plan for Problem Three  Active  THN CM Short Term Goal #1   Pt will show improvement in wound healing within 30 days  THN CM Short Term Goal #1 Start Date  04/01/18  Interventions for Short Term Goal #1  RN CM ask pt to work with home health RN providing wound care, ask pt to keep bandage intact, ulcer is open to air today with no bandage,  RNCM reviewed importance of keeping CBG within normal limits for optimal wound healing, reviewed signs/ symptoms infection      Plan: see pt for home visit next month Collaborate with home health Assess CBG log Follow up podiatrist visit/ any changes  made  Jacqlyn Larsen [facility], [place] [phone]

## 2018-04-02 ENCOUNTER — Encounter: Payer: Self-pay | Admitting: *Deleted

## 2018-04-02 DIAGNOSIS — R300 Dysuria: Secondary | ICD-10-CM | POA: Diagnosis not present

## 2018-04-02 NOTE — Patient Outreach (Signed)
[facility]) Care Management  04/01/2018  Kathryn Ryan Dec 14, 1940 [id number]  Social work referral received on 03/19/18 from [place], [place]ore Nan, for community resources of in-home aide and possibly Meals on Wheels.  BSW visited patient at her home along with [place], Jacqlyn Larsen.  Patient currently has in-home assistance through [facility] for a total of four hours per week.  The assistance that is being provided is for light housekeeping.  Patient denies needing assistance with ADL's.  Patient reported that she would like to have more hours of in-home assistance but cannot afford more hours at the current rate through [place].  Patient reported that she previously had someone coming to her home multiple times a week to complete household chores but this person recently had an injury and will not be able to come for at least another four weeks.  BSW talked with patient about Companion Services through [facility] as the rate for this service is less than rate at [employer]. Patient said she would like to think about this option BSW also talked with patient about getting her on the wait list for [facility] [place].   BSW asked patient about the need for food resources.  Patient denies needing these resources as she still drives, shops, and cooks for herself.  She has the financial means to provide food for herself. BSW and RNCM talked with patient about options for socialization as she reported feeling lonely at times.   In response to social works needs identified during home visits, BSW mailed information to patient for in-home care services and programs in [place] for socialization. No other social work needs have been identified at this time.  BSW will follow up with patient next week to ensure receipt of this information.   Ronn Melena, BSW Social Worker [phone]

## 2018-04-05 DIAGNOSIS — L97529 Non-pressure chronic ulcer of other part of left foot with unspecified severity: Secondary | ICD-10-CM | POA: Diagnosis not present

## 2018-04-05 DIAGNOSIS — E11621 Type 2 diabetes mellitus with foot ulcer: Secondary | ICD-10-CM | POA: Diagnosis not present

## 2018-04-05 DIAGNOSIS — E1140 Type 2 diabetes mellitus with diabetic neuropathy, unspecified: Secondary | ICD-10-CM | POA: Diagnosis not present

## 2018-04-06 ENCOUNTER — Ambulatory Visit: Payer: Self-pay

## 2018-04-06 DIAGNOSIS — Z87440 Personal history of urinary (tract) infections: Secondary | ICD-10-CM | POA: Diagnosis not present

## 2018-04-06 DIAGNOSIS — E1142 Type 2 diabetes mellitus with diabetic polyneuropathy: Secondary | ICD-10-CM | POA: Diagnosis not present

## 2018-04-06 DIAGNOSIS — R296 Repeated falls: Secondary | ICD-10-CM | POA: Diagnosis not present

## 2018-04-06 DIAGNOSIS — L97521 Non-pressure chronic ulcer of other part of left foot limited to breakdown of skin: Secondary | ICD-10-CM | POA: Diagnosis not present

## 2018-04-06 DIAGNOSIS — M170 Bilateral primary osteoarthritis of knee: Secondary | ICD-10-CM | POA: Diagnosis not present

## 2018-04-06 DIAGNOSIS — I872 Venous insufficiency (chronic) (peripheral): Secondary | ICD-10-CM | POA: Diagnosis not present

## 2018-04-07 DIAGNOSIS — Z87440 Personal history of urinary (tract) infections: Secondary | ICD-10-CM | POA: Diagnosis not present

## 2018-04-07 DIAGNOSIS — L97521 Non-pressure chronic ulcer of other part of left foot limited to breakdown of skin: Secondary | ICD-10-CM | POA: Diagnosis not present

## 2018-04-07 DIAGNOSIS — M170 Bilateral primary osteoarthritis of knee: Secondary | ICD-10-CM | POA: Diagnosis not present

## 2018-04-07 DIAGNOSIS — I872 Venous insufficiency (chronic) (peripheral): Secondary | ICD-10-CM | POA: Diagnosis not present

## 2018-04-07 DIAGNOSIS — R296 Repeated falls: Secondary | ICD-10-CM | POA: Diagnosis not present

## 2018-04-07 DIAGNOSIS — E1142 Type 2 diabetes mellitus with diabetic polyneuropathy: Secondary | ICD-10-CM | POA: Diagnosis not present

## 2018-04-08 ENCOUNTER — Ambulatory Visit: Payer: Self-pay

## 2018-04-08 ENCOUNTER — Other Ambulatory Visit: Payer: Self-pay

## 2018-04-08 NOTE — Patient Outreach (Signed)
[place] [facility]) Care Management  04/08/2018  Kathryn Ryan 10/01/1941 [id number]   BSW attempted to contact patient to follow up on community resources mailed to her last week.  Patient did not answer and no voicemail.  BSW will make second attempt within 3-4 business days.  Unsuccessful outreach letter mailed.   Ronn Melena, BSW Social Worker [phone]

## 2018-04-09 DIAGNOSIS — R296 Repeated falls: Secondary | ICD-10-CM | POA: Diagnosis not present

## 2018-04-09 DIAGNOSIS — I872 Venous insufficiency (chronic) (peripheral): Secondary | ICD-10-CM | POA: Diagnosis not present

## 2018-04-09 DIAGNOSIS — M170 Bilateral primary osteoarthritis of knee: Secondary | ICD-10-CM | POA: Diagnosis not present

## 2018-04-09 DIAGNOSIS — E1142 Type 2 diabetes mellitus with diabetic polyneuropathy: Secondary | ICD-10-CM | POA: Diagnosis not present

## 2018-04-09 DIAGNOSIS — L97521 Non-pressure chronic ulcer of other part of left foot limited to breakdown of skin: Secondary | ICD-10-CM | POA: Diagnosis not present

## 2018-04-09 DIAGNOSIS — Z87440 Personal history of urinary (tract) infections: Secondary | ICD-10-CM | POA: Diagnosis not present

## 2018-04-13 ENCOUNTER — Other Ambulatory Visit: Payer: Self-pay

## 2018-04-13 DIAGNOSIS — I872 Venous insufficiency (chronic) (peripheral): Secondary | ICD-10-CM | POA: Diagnosis not present

## 2018-04-13 DIAGNOSIS — M170 Bilateral primary osteoarthritis of knee: Secondary | ICD-10-CM | POA: Diagnosis not present

## 2018-04-13 DIAGNOSIS — E1142 Type 2 diabetes mellitus with diabetic polyneuropathy: Secondary | ICD-10-CM | POA: Diagnosis not present

## 2018-04-13 DIAGNOSIS — R296 Repeated falls: Secondary | ICD-10-CM | POA: Diagnosis not present

## 2018-04-13 DIAGNOSIS — Z87440 Personal history of urinary (tract) infections: Secondary | ICD-10-CM | POA: Diagnosis not present

## 2018-04-13 DIAGNOSIS — L97521 Non-pressure chronic ulcer of other part of left foot limited to breakdown of skin: Secondary | ICD-10-CM | POA: Diagnosis not present

## 2018-04-13 NOTE — Patient Outreach (Signed)
[place] [facility]) Care Management  04/13/2018  Kathryn Ryan [birth year] [id number]  BSW made second outreach attempt to ensure that patient received community resources mailed after last home visit.  Patient reported that she did not receive resources and asked that they be mailed again.  BSW mailed resources again.  BSW will follow up next week to ensure receipt.    Ronn Melena, BSW Social Worker [phone]

## 2018-04-15 DIAGNOSIS — L97521 Non-pressure chronic ulcer of other part of left foot limited to breakdown of skin: Secondary | ICD-10-CM | POA: Diagnosis not present

## 2018-04-15 DIAGNOSIS — E1142 Type 2 diabetes mellitus with diabetic polyneuropathy: Secondary | ICD-10-CM | POA: Diagnosis not present

## 2018-04-15 DIAGNOSIS — Z87440 Personal history of urinary (tract) infections: Secondary | ICD-10-CM | POA: Diagnosis not present

## 2018-04-15 DIAGNOSIS — M170 Bilateral primary osteoarthritis of knee: Secondary | ICD-10-CM | POA: Diagnosis not present

## 2018-04-15 DIAGNOSIS — I872 Venous insufficiency (chronic) (peripheral): Secondary | ICD-10-CM | POA: Diagnosis not present

## 2018-04-15 DIAGNOSIS — R296 Repeated falls: Secondary | ICD-10-CM | POA: Diagnosis not present

## 2018-04-20 ENCOUNTER — Other Ambulatory Visit: Payer: Self-pay

## 2018-04-20 NOTE — Patient Outreach (Signed)
[place] [facility]) Care Management  04/20/2018  Kathryn Ryan 07/23/1941 [id number]   BSW spoke with patient who ensured receipt of resources mailed.  Social work discipline closure due to no other social work needs identified.    Ronn Melena, BSW Social Worker [phone]

## 2018-04-22 ENCOUNTER — Other Ambulatory Visit: Payer: Self-pay | Admitting: *Deleted

## 2018-04-22 NOTE — Patient Outreach (Signed)
RN CM called pt to remind her of today's scheduled home visit, spoke with pt, HIPAA verified, pt reports " oh no, not today, I don't want anyone here today"  Pt requests RN CM call back near end of the month to reschedule home visit.  PLAN Call pt near end of May  Jacqlyn Larsen [facility], [place] Coordinator [phone]

## 2018-04-22 NOTE — Patient Outreach (Signed)
Late entry- 04/08/18- Telephone call for transition of care week 4, no answer to telephone and no option to leave voicemail.  PLAN See pt for home visit in May  Jacqlyn Larsen [facility], [place] Coordinator [phone]

## 2018-04-28 DIAGNOSIS — L97521 Non-pressure chronic ulcer of other part of left foot limited to breakdown of skin: Secondary | ICD-10-CM | POA: Diagnosis not present

## 2018-04-28 DIAGNOSIS — I872 Venous insufficiency (chronic) (peripheral): Secondary | ICD-10-CM | POA: Diagnosis not present

## 2018-04-28 DIAGNOSIS — M170 Bilateral primary osteoarthritis of knee: Secondary | ICD-10-CM | POA: Diagnosis not present

## 2018-04-28 DIAGNOSIS — R296 Repeated falls: Secondary | ICD-10-CM | POA: Diagnosis not present

## 2018-04-28 DIAGNOSIS — E1142 Type 2 diabetes mellitus with diabetic polyneuropathy: Secondary | ICD-10-CM | POA: Diagnosis not present

## 2018-04-28 DIAGNOSIS — Z87440 Personal history of urinary (tract) infections: Secondary | ICD-10-CM | POA: Diagnosis not present

## 2018-05-04 ENCOUNTER — Other Ambulatory Visit: Payer: Self-pay | Admitting: *Deleted

## 2018-05-04 NOTE — Patient Outreach (Signed)
Telephone call to pt to reschedule home visit, no answer to telephone [phone], no option to leave voicemail. Unsuccessful outreach letter mailed to pt home.  PLAN Outreach pt in 3-4 business days  Jacqlyn Larsen [facility], [place] [phone]

## 2018-05-06 ENCOUNTER — Other Ambulatory Visit: Payer: Self-pay | Admitting: *Deleted

## 2018-05-06 NOTE — Patient Outreach (Signed)
Telephone outreach for co-worker, Jacqlyn Larsen, RN who is out on leave. I called but did not reach Mrs. Kathryn Ryan. She doesn't have an answering machine and I could not leave a message. Almyra Free will be attempting to reach her next week.  Kathryn Ryan. Myrtie Neither, MSN, [facility] Gerontological Nurse Practitioner [facility] Care Management [phone]

## 2018-05-07 ENCOUNTER — Ambulatory Visit: Payer: Self-pay | Admitting: *Deleted

## 2018-05-07 DIAGNOSIS — E222 Syndrome of inappropriate secretion of antidiuretic hormone: Secondary | ICD-10-CM | POA: Diagnosis not present

## 2018-05-07 DIAGNOSIS — F339 Major depressive disorder, recurrent, unspecified: Secondary | ICD-10-CM | POA: Diagnosis not present

## 2018-05-07 DIAGNOSIS — L97521 Non-pressure chronic ulcer of other part of left foot limited to breakdown of skin: Secondary | ICD-10-CM | POA: Diagnosis not present

## 2018-05-07 DIAGNOSIS — M170 Bilateral primary osteoarthritis of knee: Secondary | ICD-10-CM | POA: Diagnosis not present

## 2018-05-07 DIAGNOSIS — N3000 Acute cystitis without hematuria: Secondary | ICD-10-CM | POA: Diagnosis not present

## 2018-05-07 DIAGNOSIS — N390 Urinary tract infection, site not specified: Secondary | ICD-10-CM | POA: Diagnosis not present

## 2018-05-07 DIAGNOSIS — D500 Iron deficiency anemia secondary to blood loss (chronic): Secondary | ICD-10-CM | POA: Diagnosis not present

## 2018-05-07 DIAGNOSIS — L97529 Non-pressure chronic ulcer of other part of left foot with unspecified severity: Secondary | ICD-10-CM | POA: Diagnosis not present

## 2018-05-07 NOTE — Patient Outreach (Signed)
Telephone outreach was unsuccessful. Unable to leave a message. I will advise assigned care manager of inability to contact this member.  Kathryn Ryan. Myrtie Neither, MSN, [facility] Gerontological Nurse Practitioner [facility] Care Management [phone]

## 2018-05-08 DIAGNOSIS — E11610 Type 2 diabetes mellitus with diabetic neuropathic arthropathy: Secondary | ICD-10-CM | POA: Diagnosis present

## 2018-05-08 DIAGNOSIS — G4733 Obstructive sleep apnea (adult) (pediatric): Secondary | ICD-10-CM | POA: Diagnosis present

## 2018-05-08 DIAGNOSIS — R296 Repeated falls: Secondary | ICD-10-CM | POA: Diagnosis not present

## 2018-05-08 DIAGNOSIS — E785 Hyperlipidemia, unspecified: Secondary | ICD-10-CM | POA: Diagnosis present

## 2018-05-08 DIAGNOSIS — M47896 Other spondylosis, lumbar region: Secondary | ICD-10-CM | POA: Diagnosis present

## 2018-05-08 DIAGNOSIS — S90416A Abrasion, unspecified lesser toe(s), initial encounter: Secondary | ICD-10-CM | POA: Diagnosis present

## 2018-05-08 DIAGNOSIS — F339 Major depressive disorder, recurrent, unspecified: Secondary | ICD-10-CM | POA: Diagnosis present

## 2018-05-08 DIAGNOSIS — Z90710 Acquired absence of both cervix and uterus: Secondary | ICD-10-CM | POA: Diagnosis not present

## 2018-05-08 DIAGNOSIS — M6281 Muscle weakness (generalized): Secondary | ICD-10-CM | POA: Diagnosis not present

## 2018-05-08 DIAGNOSIS — D509 Iron deficiency anemia, unspecified: Secondary | ICD-10-CM | POA: Diagnosis not present

## 2018-05-08 DIAGNOSIS — E222 Syndrome of inappropriate secretion of antidiuretic hormone: Secondary | ICD-10-CM | POA: Diagnosis present

## 2018-05-08 DIAGNOSIS — Z9119 Patient's noncompliance with other medical treatment and regimen: Secondary | ICD-10-CM | POA: Diagnosis not present

## 2018-05-08 DIAGNOSIS — M5136 Other intervertebral disc degeneration, lumbar region: Secondary | ICD-10-CM | POA: Diagnosis not present

## 2018-05-08 DIAGNOSIS — D500 Iron deficiency anemia secondary to blood loss (chronic): Secondary | ICD-10-CM | POA: Diagnosis present

## 2018-05-08 DIAGNOSIS — I10 Essential (primary) hypertension: Secondary | ICD-10-CM | POA: Diagnosis present

## 2018-05-08 DIAGNOSIS — I878 Other specified disorders of veins: Secondary | ICD-10-CM | POA: Diagnosis present

## 2018-05-08 DIAGNOSIS — K219 Gastro-esophageal reflux disease without esophagitis: Secondary | ICD-10-CM | POA: Diagnosis present

## 2018-05-08 DIAGNOSIS — N390 Urinary tract infection, site not specified: Secondary | ICD-10-CM | POA: Diagnosis present

## 2018-05-08 DIAGNOSIS — B9620 Unspecified Escherichia coli [E. coli] as the cause of diseases classified elsewhere: Secondary | ICD-10-CM | POA: Diagnosis present

## 2018-05-08 DIAGNOSIS — R2689 Other abnormalities of gait and mobility: Secondary | ICD-10-CM | POA: Diagnosis not present

## 2018-05-08 DIAGNOSIS — Z9049 Acquired absence of other specified parts of digestive tract: Secondary | ICD-10-CM | POA: Diagnosis not present

## 2018-05-08 DIAGNOSIS — M170 Bilateral primary osteoarthritis of knee: Secondary | ICD-10-CM | POA: Diagnosis present

## 2018-05-08 DIAGNOSIS — Z9181 History of falling: Secondary | ICD-10-CM | POA: Diagnosis not present

## 2018-05-08 DIAGNOSIS — E1142 Type 2 diabetes mellitus with diabetic polyneuropathy: Secondary | ICD-10-CM | POA: Diagnosis present

## 2018-05-08 DIAGNOSIS — Z794 Long term (current) use of insulin: Secondary | ICD-10-CM | POA: Diagnosis not present

## 2018-05-08 DIAGNOSIS — E039 Hypothyroidism, unspecified: Secondary | ICD-10-CM | POA: Diagnosis present

## 2018-05-08 DIAGNOSIS — E871 Hypo-osmolality and hyponatremia: Secondary | ICD-10-CM | POA: Diagnosis not present

## 2018-05-11 DIAGNOSIS — R2689 Other abnormalities of gait and mobility: Secondary | ICD-10-CM | POA: Diagnosis not present

## 2018-05-11 DIAGNOSIS — E1142 Type 2 diabetes mellitus with diabetic polyneuropathy: Secondary | ICD-10-CM | POA: Diagnosis not present

## 2018-05-11 DIAGNOSIS — I10 Essential (primary) hypertension: Secondary | ICD-10-CM | POA: Diagnosis not present

## 2018-05-11 DIAGNOSIS — D509 Iron deficiency anemia, unspecified: Secondary | ICD-10-CM | POA: Diagnosis not present

## 2018-05-11 DIAGNOSIS — E871 Hypo-osmolality and hyponatremia: Secondary | ICD-10-CM | POA: Diagnosis not present

## 2018-05-11 DIAGNOSIS — F339 Major depressive disorder, recurrent, unspecified: Secondary | ICD-10-CM | POA: Diagnosis not present

## 2018-05-11 DIAGNOSIS — Z794 Long term (current) use of insulin: Secondary | ICD-10-CM | POA: Diagnosis not present

## 2018-05-11 DIAGNOSIS — M6281 Muscle weakness (generalized): Secondary | ICD-10-CM | POA: Diagnosis not present

## 2018-05-11 DIAGNOSIS — M170 Bilateral primary osteoarthritis of knee: Secondary | ICD-10-CM | POA: Diagnosis not present

## 2018-05-11 DIAGNOSIS — B9620 Unspecified Escherichia coli [E. coli] as the cause of diseases classified elsewhere: Secondary | ICD-10-CM | POA: Diagnosis not present

## 2018-05-11 DIAGNOSIS — R296 Repeated falls: Secondary | ICD-10-CM | POA: Diagnosis not present

## 2018-05-11 DIAGNOSIS — N390 Urinary tract infection, site not specified: Secondary | ICD-10-CM | POA: Diagnosis not present

## 2018-05-11 DIAGNOSIS — E039 Hypothyroidism, unspecified: Secondary | ICD-10-CM | POA: Diagnosis not present

## 2018-05-11 DIAGNOSIS — K219 Gastro-esophageal reflux disease without esophagitis: Secondary | ICD-10-CM | POA: Diagnosis not present

## 2018-05-11 DIAGNOSIS — M5136 Other intervertebral disc degeneration, lumbar region: Secondary | ICD-10-CM | POA: Diagnosis not present

## 2018-05-12 ENCOUNTER — Other Ambulatory Visit: Payer: Self-pay | Admitting: *Deleted

## 2018-05-12 ENCOUNTER — Encounter: Payer: Self-pay | Admitting: *Deleted

## 2018-05-12 NOTE — Patient Outreach (Signed)
Telephone call to pt (third attempt), no answer to telephone and no option to leave voicemail.  Pt did not respond to letter mailed to her home.  RN CM faxed case closure letter to Dr. Gar Ponto and mailed case closure letter to patient's home.  PLAN Case closed today  Jacqlyn Larsen [facility], BSN [place] Coordinator [phone]

## 2018-05-14 DIAGNOSIS — E871 Hypo-osmolality and hyponatremia: Secondary | ICD-10-CM | POA: Diagnosis not present

## 2018-05-14 DIAGNOSIS — R296 Repeated falls: Secondary | ICD-10-CM | POA: Diagnosis not present

## 2018-05-14 DIAGNOSIS — N390 Urinary tract infection, site not specified: Secondary | ICD-10-CM | POA: Diagnosis not present

## 2018-05-15 DIAGNOSIS — Z79899 Other long term (current) drug therapy: Secondary | ICD-10-CM | POA: Diagnosis not present

## 2018-05-15 DIAGNOSIS — L97521 Non-pressure chronic ulcer of other part of left foot limited to breakdown of skin: Secondary | ICD-10-CM | POA: Diagnosis not present

## 2018-05-15 DIAGNOSIS — E1142 Type 2 diabetes mellitus with diabetic polyneuropathy: Secondary | ICD-10-CM | POA: Diagnosis not present

## 2018-05-15 DIAGNOSIS — K219 Gastro-esophageal reflux disease without esophagitis: Secondary | ICD-10-CM | POA: Diagnosis not present

## 2018-05-15 DIAGNOSIS — E039 Hypothyroidism, unspecified: Secondary | ICD-10-CM | POA: Diagnosis not present

## 2018-05-15 DIAGNOSIS — M170 Bilateral primary osteoarthritis of knee: Secondary | ICD-10-CM | POA: Diagnosis not present

## 2018-05-15 DIAGNOSIS — G4733 Obstructive sleep apnea (adult) (pediatric): Secondary | ICD-10-CM | POA: Diagnosis not present

## 2018-05-15 DIAGNOSIS — I10 Essential (primary) hypertension: Secondary | ICD-10-CM | POA: Diagnosis not present

## 2018-05-15 DIAGNOSIS — M5136 Other intervertebral disc degeneration, lumbar region: Secondary | ICD-10-CM | POA: Diagnosis not present

## 2018-05-15 DIAGNOSIS — D509 Iron deficiency anemia, unspecified: Secondary | ICD-10-CM | POA: Diagnosis not present

## 2018-05-15 DIAGNOSIS — F331 Major depressive disorder, recurrent, moderate: Secondary | ICD-10-CM | POA: Diagnosis not present

## 2018-05-15 DIAGNOSIS — I872 Venous insufficiency (chronic) (peripheral): Secondary | ICD-10-CM | POA: Diagnosis not present

## 2018-05-15 DIAGNOSIS — Z794 Long term (current) use of insulin: Secondary | ICD-10-CM | POA: Diagnosis not present

## 2018-05-15 DIAGNOSIS — Z87440 Personal history of urinary (tract) infections: Secondary | ICD-10-CM | POA: Diagnosis not present

## 2018-05-15 DIAGNOSIS — Z4800 Encounter for change or removal of nonsurgical wound dressing: Secondary | ICD-10-CM | POA: Diagnosis not present

## 2018-05-15 DIAGNOSIS — Z6839 Body mass index (BMI) 39.0-39.9, adult: Secondary | ICD-10-CM | POA: Diagnosis not present

## 2018-05-15 DIAGNOSIS — L97511 Non-pressure chronic ulcer of other part of right foot limited to breakdown of skin: Secondary | ICD-10-CM | POA: Diagnosis not present

## 2018-05-17 DIAGNOSIS — L851 Acquired keratosis [keratoderma] palmaris et plantaris: Secondary | ICD-10-CM | POA: Diagnosis not present

## 2018-05-17 DIAGNOSIS — L97519 Non-pressure chronic ulcer of other part of right foot with unspecified severity: Secondary | ICD-10-CM | POA: Diagnosis not present

## 2018-05-17 DIAGNOSIS — M79673 Pain in unspecified foot: Secondary | ICD-10-CM | POA: Diagnosis not present

## 2018-05-17 DIAGNOSIS — B351 Tinea unguium: Secondary | ICD-10-CM | POA: Diagnosis not present

## 2018-05-17 DIAGNOSIS — L97509 Non-pressure chronic ulcer of other part of unspecified foot with unspecified severity: Secondary | ICD-10-CM | POA: Diagnosis not present

## 2018-05-17 DIAGNOSIS — E1140 Type 2 diabetes mellitus with diabetic neuropathy, unspecified: Secondary | ICD-10-CM | POA: Diagnosis not present

## 2018-05-18 DIAGNOSIS — E1142 Type 2 diabetes mellitus with diabetic polyneuropathy: Secondary | ICD-10-CM | POA: Diagnosis not present

## 2018-05-18 DIAGNOSIS — M170 Bilateral primary osteoarthritis of knee: Secondary | ICD-10-CM | POA: Diagnosis not present

## 2018-05-18 DIAGNOSIS — L97521 Non-pressure chronic ulcer of other part of left foot limited to breakdown of skin: Secondary | ICD-10-CM | POA: Diagnosis not present

## 2018-05-18 DIAGNOSIS — M5136 Other intervertebral disc degeneration, lumbar region: Secondary | ICD-10-CM | POA: Diagnosis not present

## 2018-05-18 DIAGNOSIS — L97511 Non-pressure chronic ulcer of other part of right foot limited to breakdown of skin: Secondary | ICD-10-CM | POA: Diagnosis not present

## 2018-05-18 DIAGNOSIS — D509 Iron deficiency anemia, unspecified: Secondary | ICD-10-CM | POA: Diagnosis not present

## 2018-05-19 DIAGNOSIS — M5136 Other intervertebral disc degeneration, lumbar region: Secondary | ICD-10-CM | POA: Diagnosis not present

## 2018-05-19 DIAGNOSIS — L97521 Non-pressure chronic ulcer of other part of left foot limited to breakdown of skin: Secondary | ICD-10-CM | POA: Diagnosis not present

## 2018-05-19 DIAGNOSIS — M170 Bilateral primary osteoarthritis of knee: Secondary | ICD-10-CM | POA: Diagnosis not present

## 2018-05-19 DIAGNOSIS — E871 Hypo-osmolality and hyponatremia: Secondary | ICD-10-CM | POA: Diagnosis not present

## 2018-05-19 DIAGNOSIS — E1142 Type 2 diabetes mellitus with diabetic polyneuropathy: Secondary | ICD-10-CM | POA: Diagnosis not present

## 2018-05-19 DIAGNOSIS — L97511 Non-pressure chronic ulcer of other part of right foot limited to breakdown of skin: Secondary | ICD-10-CM | POA: Diagnosis not present

## 2018-05-19 DIAGNOSIS — E782 Mixed hyperlipidemia: Secondary | ICD-10-CM | POA: Diagnosis not present

## 2018-05-19 DIAGNOSIS — D509 Iron deficiency anemia, unspecified: Secondary | ICD-10-CM | POA: Diagnosis not present

## 2018-05-19 DIAGNOSIS — Z6837 Body mass index (BMI) 37.0-37.9, adult: Secondary | ICD-10-CM | POA: Diagnosis not present

## 2018-05-19 DIAGNOSIS — E1140 Type 2 diabetes mellitus with diabetic neuropathy, unspecified: Secondary | ICD-10-CM | POA: Diagnosis not present

## 2018-05-19 DIAGNOSIS — E1122 Type 2 diabetes mellitus with diabetic chronic kidney disease: Secondary | ICD-10-CM | POA: Diagnosis not present

## 2018-05-20 DIAGNOSIS — L97511 Non-pressure chronic ulcer of other part of right foot limited to breakdown of skin: Secondary | ICD-10-CM | POA: Diagnosis not present

## 2018-05-20 DIAGNOSIS — M170 Bilateral primary osteoarthritis of knee: Secondary | ICD-10-CM | POA: Diagnosis not present

## 2018-05-20 DIAGNOSIS — L97521 Non-pressure chronic ulcer of other part of left foot limited to breakdown of skin: Secondary | ICD-10-CM | POA: Diagnosis not present

## 2018-05-20 DIAGNOSIS — D509 Iron deficiency anemia, unspecified: Secondary | ICD-10-CM | POA: Diagnosis not present

## 2018-05-20 DIAGNOSIS — M5136 Other intervertebral disc degeneration, lumbar region: Secondary | ICD-10-CM | POA: Diagnosis not present

## 2018-05-20 DIAGNOSIS — E1142 Type 2 diabetes mellitus with diabetic polyneuropathy: Secondary | ICD-10-CM | POA: Diagnosis not present

## 2018-05-21 DIAGNOSIS — L97521 Non-pressure chronic ulcer of other part of left foot limited to breakdown of skin: Secondary | ICD-10-CM | POA: Diagnosis not present

## 2018-05-21 DIAGNOSIS — E1142 Type 2 diabetes mellitus with diabetic polyneuropathy: Secondary | ICD-10-CM | POA: Diagnosis not present

## 2018-05-21 DIAGNOSIS — M170 Bilateral primary osteoarthritis of knee: Secondary | ICD-10-CM | POA: Diagnosis not present

## 2018-05-21 DIAGNOSIS — L97511 Non-pressure chronic ulcer of other part of right foot limited to breakdown of skin: Secondary | ICD-10-CM | POA: Diagnosis not present

## 2018-05-21 DIAGNOSIS — D509 Iron deficiency anemia, unspecified: Secondary | ICD-10-CM | POA: Diagnosis not present

## 2018-05-21 DIAGNOSIS — M5136 Other intervertebral disc degeneration, lumbar region: Secondary | ICD-10-CM | POA: Diagnosis not present

## 2018-05-24 DIAGNOSIS — M170 Bilateral primary osteoarthritis of knee: Secondary | ICD-10-CM | POA: Diagnosis not present

## 2018-05-24 DIAGNOSIS — E1142 Type 2 diabetes mellitus with diabetic polyneuropathy: Secondary | ICD-10-CM | POA: Diagnosis not present

## 2018-05-24 DIAGNOSIS — D509 Iron deficiency anemia, unspecified: Secondary | ICD-10-CM | POA: Diagnosis not present

## 2018-05-24 DIAGNOSIS — E11621 Type 2 diabetes mellitus with foot ulcer: Secondary | ICD-10-CM | POA: Diagnosis not present

## 2018-05-24 DIAGNOSIS — E1140 Type 2 diabetes mellitus with diabetic neuropathy, unspecified: Secondary | ICD-10-CM | POA: Diagnosis not present

## 2018-05-24 DIAGNOSIS — L97511 Non-pressure chronic ulcer of other part of right foot limited to breakdown of skin: Secondary | ICD-10-CM | POA: Diagnosis not present

## 2018-05-24 DIAGNOSIS — L97529 Non-pressure chronic ulcer of other part of left foot with unspecified severity: Secondary | ICD-10-CM | POA: Diagnosis not present

## 2018-05-24 DIAGNOSIS — L97521 Non-pressure chronic ulcer of other part of left foot limited to breakdown of skin: Secondary | ICD-10-CM | POA: Diagnosis not present

## 2018-05-24 DIAGNOSIS — L97519 Non-pressure chronic ulcer of other part of right foot with unspecified severity: Secondary | ICD-10-CM | POA: Diagnosis not present

## 2018-05-24 DIAGNOSIS — M5136 Other intervertebral disc degeneration, lumbar region: Secondary | ICD-10-CM | POA: Diagnosis not present

## 2018-05-25 DIAGNOSIS — D509 Iron deficiency anemia, unspecified: Secondary | ICD-10-CM | POA: Diagnosis not present

## 2018-05-25 DIAGNOSIS — E1142 Type 2 diabetes mellitus with diabetic polyneuropathy: Secondary | ICD-10-CM | POA: Diagnosis not present

## 2018-05-25 DIAGNOSIS — M170 Bilateral primary osteoarthritis of knee: Secondary | ICD-10-CM | POA: Diagnosis not present

## 2018-05-25 DIAGNOSIS — L97521 Non-pressure chronic ulcer of other part of left foot limited to breakdown of skin: Secondary | ICD-10-CM | POA: Diagnosis not present

## 2018-05-25 DIAGNOSIS — M5136 Other intervertebral disc degeneration, lumbar region: Secondary | ICD-10-CM | POA: Diagnosis not present

## 2018-05-25 DIAGNOSIS — L97511 Non-pressure chronic ulcer of other part of right foot limited to breakdown of skin: Secondary | ICD-10-CM | POA: Diagnosis not present

## 2018-05-27 DIAGNOSIS — L97511 Non-pressure chronic ulcer of other part of right foot limited to breakdown of skin: Secondary | ICD-10-CM | POA: Diagnosis not present

## 2018-05-27 DIAGNOSIS — N390 Urinary tract infection, site not specified: Secondary | ICD-10-CM | POA: Diagnosis not present

## 2018-05-27 DIAGNOSIS — L97521 Non-pressure chronic ulcer of other part of left foot limited to breakdown of skin: Secondary | ICD-10-CM | POA: Diagnosis not present

## 2018-05-27 DIAGNOSIS — E1142 Type 2 diabetes mellitus with diabetic polyneuropathy: Secondary | ICD-10-CM | POA: Diagnosis not present

## 2018-05-27 DIAGNOSIS — M5136 Other intervertebral disc degeneration, lumbar region: Secondary | ICD-10-CM | POA: Diagnosis not present

## 2018-05-27 DIAGNOSIS — M170 Bilateral primary osteoarthritis of knee: Secondary | ICD-10-CM | POA: Diagnosis not present

## 2018-05-27 DIAGNOSIS — D509 Iron deficiency anemia, unspecified: Secondary | ICD-10-CM | POA: Diagnosis not present

## 2018-05-28 DIAGNOSIS — E1142 Type 2 diabetes mellitus with diabetic polyneuropathy: Secondary | ICD-10-CM | POA: Diagnosis not present

## 2018-05-28 DIAGNOSIS — L97521 Non-pressure chronic ulcer of other part of left foot limited to breakdown of skin: Secondary | ICD-10-CM | POA: Diagnosis not present

## 2018-05-28 DIAGNOSIS — M170 Bilateral primary osteoarthritis of knee: Secondary | ICD-10-CM | POA: Diagnosis not present

## 2018-05-28 DIAGNOSIS — L97511 Non-pressure chronic ulcer of other part of right foot limited to breakdown of skin: Secondary | ICD-10-CM | POA: Diagnosis not present

## 2018-05-28 DIAGNOSIS — D509 Iron deficiency anemia, unspecified: Secondary | ICD-10-CM | POA: Diagnosis not present

## 2018-05-28 DIAGNOSIS — M5136 Other intervertebral disc degeneration, lumbar region: Secondary | ICD-10-CM | POA: Diagnosis not present

## 2018-05-31 DIAGNOSIS — M170 Bilateral primary osteoarthritis of knee: Secondary | ICD-10-CM | POA: Diagnosis not present

## 2018-05-31 DIAGNOSIS — D509 Iron deficiency anemia, unspecified: Secondary | ICD-10-CM | POA: Diagnosis not present

## 2018-05-31 DIAGNOSIS — L97521 Non-pressure chronic ulcer of other part of left foot limited to breakdown of skin: Secondary | ICD-10-CM | POA: Diagnosis not present

## 2018-05-31 DIAGNOSIS — L97511 Non-pressure chronic ulcer of other part of right foot limited to breakdown of skin: Secondary | ICD-10-CM | POA: Diagnosis not present

## 2018-05-31 DIAGNOSIS — M5136 Other intervertebral disc degeneration, lumbar region: Secondary | ICD-10-CM | POA: Diagnosis not present

## 2018-05-31 DIAGNOSIS — E1142 Type 2 diabetes mellitus with diabetic polyneuropathy: Secondary | ICD-10-CM | POA: Diagnosis not present

## 2018-06-01 DIAGNOSIS — M170 Bilateral primary osteoarthritis of knee: Secondary | ICD-10-CM | POA: Diagnosis not present

## 2018-06-01 DIAGNOSIS — L97511 Non-pressure chronic ulcer of other part of right foot limited to breakdown of skin: Secondary | ICD-10-CM | POA: Diagnosis not present

## 2018-06-01 DIAGNOSIS — E1142 Type 2 diabetes mellitus with diabetic polyneuropathy: Secondary | ICD-10-CM | POA: Diagnosis not present

## 2018-06-01 DIAGNOSIS — D509 Iron deficiency anemia, unspecified: Secondary | ICD-10-CM | POA: Diagnosis not present

## 2018-06-01 DIAGNOSIS — L97521 Non-pressure chronic ulcer of other part of left foot limited to breakdown of skin: Secondary | ICD-10-CM | POA: Diagnosis not present

## 2018-06-01 DIAGNOSIS — M5136 Other intervertebral disc degeneration, lumbar region: Secondary | ICD-10-CM | POA: Diagnosis not present

## 2018-06-02 DIAGNOSIS — E1142 Type 2 diabetes mellitus with diabetic polyneuropathy: Secondary | ICD-10-CM | POA: Diagnosis not present

## 2018-06-02 DIAGNOSIS — D509 Iron deficiency anemia, unspecified: Secondary | ICD-10-CM | POA: Diagnosis not present

## 2018-06-02 DIAGNOSIS — L97521 Non-pressure chronic ulcer of other part of left foot limited to breakdown of skin: Secondary | ICD-10-CM | POA: Diagnosis not present

## 2018-06-02 DIAGNOSIS — M170 Bilateral primary osteoarthritis of knee: Secondary | ICD-10-CM | POA: Diagnosis not present

## 2018-06-02 DIAGNOSIS — L97511 Non-pressure chronic ulcer of other part of right foot limited to breakdown of skin: Secondary | ICD-10-CM | POA: Diagnosis not present

## 2018-06-02 DIAGNOSIS — M5136 Other intervertebral disc degeneration, lumbar region: Secondary | ICD-10-CM | POA: Diagnosis not present

## 2018-06-03 DIAGNOSIS — L97511 Non-pressure chronic ulcer of other part of right foot limited to breakdown of skin: Secondary | ICD-10-CM | POA: Diagnosis not present

## 2018-06-03 DIAGNOSIS — M5136 Other intervertebral disc degeneration, lumbar region: Secondary | ICD-10-CM | POA: Diagnosis not present

## 2018-06-03 DIAGNOSIS — M170 Bilateral primary osteoarthritis of knee: Secondary | ICD-10-CM | POA: Diagnosis not present

## 2018-06-03 DIAGNOSIS — D509 Iron deficiency anemia, unspecified: Secondary | ICD-10-CM | POA: Diagnosis not present

## 2018-06-03 DIAGNOSIS — E1142 Type 2 diabetes mellitus with diabetic polyneuropathy: Secondary | ICD-10-CM | POA: Diagnosis not present

## 2018-06-03 DIAGNOSIS — L97521 Non-pressure chronic ulcer of other part of left foot limited to breakdown of skin: Secondary | ICD-10-CM | POA: Diagnosis not present

## 2018-06-04 DIAGNOSIS — L97521 Non-pressure chronic ulcer of other part of left foot limited to breakdown of skin: Secondary | ICD-10-CM | POA: Diagnosis not present

## 2018-06-04 DIAGNOSIS — L97511 Non-pressure chronic ulcer of other part of right foot limited to breakdown of skin: Secondary | ICD-10-CM | POA: Diagnosis not present

## 2018-06-04 DIAGNOSIS — M5136 Other intervertebral disc degeneration, lumbar region: Secondary | ICD-10-CM | POA: Diagnosis not present

## 2018-06-04 DIAGNOSIS — E1142 Type 2 diabetes mellitus with diabetic polyneuropathy: Secondary | ICD-10-CM | POA: Diagnosis not present

## 2018-06-04 DIAGNOSIS — D509 Iron deficiency anemia, unspecified: Secondary | ICD-10-CM | POA: Diagnosis not present

## 2018-06-04 DIAGNOSIS — M170 Bilateral primary osteoarthritis of knee: Secondary | ICD-10-CM | POA: Diagnosis not present

## 2018-06-07 DIAGNOSIS — L97529 Non-pressure chronic ulcer of other part of left foot with unspecified severity: Secondary | ICD-10-CM | POA: Diagnosis not present

## 2018-06-07 DIAGNOSIS — E871 Hypo-osmolality and hyponatremia: Secondary | ICD-10-CM | POA: Diagnosis not present

## 2018-06-07 DIAGNOSIS — E1122 Type 2 diabetes mellitus with diabetic chronic kidney disease: Secondary | ICD-10-CM | POA: Diagnosis not present

## 2018-06-07 DIAGNOSIS — E1140 Type 2 diabetes mellitus with diabetic neuropathy, unspecified: Secondary | ICD-10-CM | POA: Diagnosis not present

## 2018-06-07 DIAGNOSIS — E782 Mixed hyperlipidemia: Secondary | ICD-10-CM | POA: Diagnosis not present

## 2018-06-07 DIAGNOSIS — Z9189 Other specified personal risk factors, not elsewhere classified: Secondary | ICD-10-CM | POA: Diagnosis not present

## 2018-06-07 DIAGNOSIS — K210 Gastro-esophageal reflux disease with esophagitis: Secondary | ICD-10-CM | POA: Diagnosis not present

## 2018-06-07 DIAGNOSIS — K7581 Nonalcoholic steatohepatitis (NASH): Secondary | ICD-10-CM | POA: Diagnosis not present

## 2018-06-07 DIAGNOSIS — I10 Essential (primary) hypertension: Secondary | ICD-10-CM | POA: Diagnosis not present

## 2018-06-07 DIAGNOSIS — D649 Anemia, unspecified: Secondary | ICD-10-CM | POA: Diagnosis not present

## 2018-06-07 DIAGNOSIS — L97519 Non-pressure chronic ulcer of other part of right foot with unspecified severity: Secondary | ICD-10-CM | POA: Diagnosis not present

## 2018-06-07 DIAGNOSIS — E8881 Metabolic syndrome: Secondary | ICD-10-CM | POA: Diagnosis not present

## 2018-06-07 DIAGNOSIS — E039 Hypothyroidism, unspecified: Secondary | ICD-10-CM | POA: Diagnosis not present

## 2018-06-07 DIAGNOSIS — E11621 Type 2 diabetes mellitus with foot ulcer: Secondary | ICD-10-CM | POA: Diagnosis not present

## 2018-06-07 DIAGNOSIS — N183 Chronic kidney disease, stage 3 (moderate): Secondary | ICD-10-CM | POA: Diagnosis not present

## 2018-06-08 DIAGNOSIS — L97521 Non-pressure chronic ulcer of other part of left foot limited to breakdown of skin: Secondary | ICD-10-CM | POA: Diagnosis not present

## 2018-06-08 DIAGNOSIS — E1142 Type 2 diabetes mellitus with diabetic polyneuropathy: Secondary | ICD-10-CM | POA: Diagnosis not present

## 2018-06-08 DIAGNOSIS — D509 Iron deficiency anemia, unspecified: Secondary | ICD-10-CM | POA: Diagnosis not present

## 2018-06-08 DIAGNOSIS — M170 Bilateral primary osteoarthritis of knee: Secondary | ICD-10-CM | POA: Diagnosis not present

## 2018-06-08 DIAGNOSIS — L97511 Non-pressure chronic ulcer of other part of right foot limited to breakdown of skin: Secondary | ICD-10-CM | POA: Diagnosis not present

## 2018-06-08 DIAGNOSIS — M5136 Other intervertebral disc degeneration, lumbar region: Secondary | ICD-10-CM | POA: Diagnosis not present

## 2018-06-09 DIAGNOSIS — L97511 Non-pressure chronic ulcer of other part of right foot limited to breakdown of skin: Secondary | ICD-10-CM | POA: Diagnosis not present

## 2018-06-09 DIAGNOSIS — M170 Bilateral primary osteoarthritis of knee: Secondary | ICD-10-CM | POA: Diagnosis not present

## 2018-06-09 DIAGNOSIS — D509 Iron deficiency anemia, unspecified: Secondary | ICD-10-CM | POA: Diagnosis not present

## 2018-06-09 DIAGNOSIS — L97521 Non-pressure chronic ulcer of other part of left foot limited to breakdown of skin: Secondary | ICD-10-CM | POA: Diagnosis not present

## 2018-06-09 DIAGNOSIS — M5136 Other intervertebral disc degeneration, lumbar region: Secondary | ICD-10-CM | POA: Diagnosis not present

## 2018-06-09 DIAGNOSIS — E1142 Type 2 diabetes mellitus with diabetic polyneuropathy: Secondary | ICD-10-CM | POA: Diagnosis not present

## 2018-06-11 DIAGNOSIS — M170 Bilateral primary osteoarthritis of knee: Secondary | ICD-10-CM | POA: Diagnosis not present

## 2018-06-11 DIAGNOSIS — M5136 Other intervertebral disc degeneration, lumbar region: Secondary | ICD-10-CM | POA: Diagnosis not present

## 2018-06-11 DIAGNOSIS — D509 Iron deficiency anemia, unspecified: Secondary | ICD-10-CM | POA: Diagnosis not present

## 2018-06-11 DIAGNOSIS — L97511 Non-pressure chronic ulcer of other part of right foot limited to breakdown of skin: Secondary | ICD-10-CM | POA: Diagnosis not present

## 2018-06-11 DIAGNOSIS — E1142 Type 2 diabetes mellitus with diabetic polyneuropathy: Secondary | ICD-10-CM | POA: Diagnosis not present

## 2018-06-11 DIAGNOSIS — L97521 Non-pressure chronic ulcer of other part of left foot limited to breakdown of skin: Secondary | ICD-10-CM | POA: Diagnosis not present

## 2018-06-13 DIAGNOSIS — E782 Mixed hyperlipidemia: Secondary | ICD-10-CM | POA: Diagnosis not present

## 2018-06-13 DIAGNOSIS — E1140 Type 2 diabetes mellitus with diabetic neuropathy, unspecified: Secondary | ICD-10-CM | POA: Diagnosis not present

## 2018-06-13 DIAGNOSIS — E871 Hypo-osmolality and hyponatremia: Secondary | ICD-10-CM | POA: Diagnosis not present

## 2018-06-13 DIAGNOSIS — Z6837 Body mass index (BMI) 37.0-37.9, adult: Secondary | ICD-10-CM | POA: Diagnosis not present

## 2018-06-13 DIAGNOSIS — E1122 Type 2 diabetes mellitus with diabetic chronic kidney disease: Secondary | ICD-10-CM | POA: Diagnosis not present

## 2018-06-14 DIAGNOSIS — L97529 Non-pressure chronic ulcer of other part of left foot with unspecified severity: Secondary | ICD-10-CM | POA: Diagnosis not present

## 2018-06-14 DIAGNOSIS — E1140 Type 2 diabetes mellitus with diabetic neuropathy, unspecified: Secondary | ICD-10-CM | POA: Diagnosis not present

## 2018-06-14 DIAGNOSIS — L97519 Non-pressure chronic ulcer of other part of right foot with unspecified severity: Secondary | ICD-10-CM | POA: Diagnosis not present

## 2018-06-14 DIAGNOSIS — E11621 Type 2 diabetes mellitus with foot ulcer: Secondary | ICD-10-CM | POA: Diagnosis not present

## 2018-06-16 DIAGNOSIS — E1142 Type 2 diabetes mellitus with diabetic polyneuropathy: Secondary | ICD-10-CM | POA: Diagnosis not present

## 2018-06-16 DIAGNOSIS — M170 Bilateral primary osteoarthritis of knee: Secondary | ICD-10-CM | POA: Diagnosis not present

## 2018-06-16 DIAGNOSIS — L97521 Non-pressure chronic ulcer of other part of left foot limited to breakdown of skin: Secondary | ICD-10-CM | POA: Diagnosis not present

## 2018-06-16 DIAGNOSIS — D509 Iron deficiency anemia, unspecified: Secondary | ICD-10-CM | POA: Diagnosis not present

## 2018-06-16 DIAGNOSIS — M5136 Other intervertebral disc degeneration, lumbar region: Secondary | ICD-10-CM | POA: Diagnosis not present

## 2018-06-16 DIAGNOSIS — L97511 Non-pressure chronic ulcer of other part of right foot limited to breakdown of skin: Secondary | ICD-10-CM | POA: Diagnosis not present

## 2018-06-17 DIAGNOSIS — Z87440 Personal history of urinary (tract) infections: Secondary | ICD-10-CM | POA: Diagnosis not present

## 2018-06-17 DIAGNOSIS — E1142 Type 2 diabetes mellitus with diabetic polyneuropathy: Secondary | ICD-10-CM | POA: Diagnosis not present

## 2018-06-17 DIAGNOSIS — M5136 Other intervertebral disc degeneration, lumbar region: Secondary | ICD-10-CM | POA: Diagnosis not present

## 2018-06-17 DIAGNOSIS — L97511 Non-pressure chronic ulcer of other part of right foot limited to breakdown of skin: Secondary | ICD-10-CM | POA: Diagnosis not present

## 2018-06-17 DIAGNOSIS — D509 Iron deficiency anemia, unspecified: Secondary | ICD-10-CM | POA: Diagnosis not present

## 2018-06-17 DIAGNOSIS — M170 Bilateral primary osteoarthritis of knee: Secondary | ICD-10-CM | POA: Diagnosis not present

## 2018-06-17 DIAGNOSIS — L97521 Non-pressure chronic ulcer of other part of left foot limited to breakdown of skin: Secondary | ICD-10-CM | POA: Diagnosis not present

## 2018-06-17 DIAGNOSIS — N390 Urinary tract infection, site not specified: Secondary | ICD-10-CM | POA: Diagnosis not present

## 2018-06-18 DIAGNOSIS — D509 Iron deficiency anemia, unspecified: Secondary | ICD-10-CM | POA: Diagnosis not present

## 2018-06-18 DIAGNOSIS — L97521 Non-pressure chronic ulcer of other part of left foot limited to breakdown of skin: Secondary | ICD-10-CM | POA: Diagnosis not present

## 2018-06-18 DIAGNOSIS — M5136 Other intervertebral disc degeneration, lumbar region: Secondary | ICD-10-CM | POA: Diagnosis not present

## 2018-06-18 DIAGNOSIS — M170 Bilateral primary osteoarthritis of knee: Secondary | ICD-10-CM | POA: Diagnosis not present

## 2018-06-18 DIAGNOSIS — E1142 Type 2 diabetes mellitus with diabetic polyneuropathy: Secondary | ICD-10-CM | POA: Diagnosis not present

## 2018-06-18 DIAGNOSIS — L97511 Non-pressure chronic ulcer of other part of right foot limited to breakdown of skin: Secondary | ICD-10-CM | POA: Diagnosis not present

## 2018-06-23 DIAGNOSIS — M170 Bilateral primary osteoarthritis of knee: Secondary | ICD-10-CM | POA: Diagnosis not present

## 2018-06-23 DIAGNOSIS — L97511 Non-pressure chronic ulcer of other part of right foot limited to breakdown of skin: Secondary | ICD-10-CM | POA: Diagnosis not present

## 2018-06-23 DIAGNOSIS — M5136 Other intervertebral disc degeneration, lumbar region: Secondary | ICD-10-CM | POA: Diagnosis not present

## 2018-06-23 DIAGNOSIS — E1142 Type 2 diabetes mellitus with diabetic polyneuropathy: Secondary | ICD-10-CM | POA: Diagnosis not present

## 2018-06-23 DIAGNOSIS — D509 Iron deficiency anemia, unspecified: Secondary | ICD-10-CM | POA: Diagnosis not present

## 2018-06-23 DIAGNOSIS — L97521 Non-pressure chronic ulcer of other part of left foot limited to breakdown of skin: Secondary | ICD-10-CM | POA: Diagnosis not present

## 2018-06-28 DIAGNOSIS — E1140 Type 2 diabetes mellitus with diabetic neuropathy, unspecified: Secondary | ICD-10-CM | POA: Diagnosis not present

## 2018-06-28 DIAGNOSIS — L97529 Non-pressure chronic ulcer of other part of left foot with unspecified severity: Secondary | ICD-10-CM | POA: Diagnosis not present

## 2018-06-28 DIAGNOSIS — E11621 Type 2 diabetes mellitus with foot ulcer: Secondary | ICD-10-CM | POA: Diagnosis not present

## 2018-06-28 DIAGNOSIS — L97519 Non-pressure chronic ulcer of other part of right foot with unspecified severity: Secondary | ICD-10-CM | POA: Diagnosis not present

## 2018-07-06 DIAGNOSIS — I10 Essential (primary) hypertension: Secondary | ICD-10-CM | POA: Diagnosis not present

## 2018-07-06 DIAGNOSIS — E782 Mixed hyperlipidemia: Secondary | ICD-10-CM | POA: Diagnosis not present

## 2018-07-06 DIAGNOSIS — K219 Gastro-esophageal reflux disease without esophagitis: Secondary | ICD-10-CM | POA: Diagnosis not present

## 2018-08-04 DIAGNOSIS — E8881 Metabolic syndrome: Secondary | ICD-10-CM | POA: Diagnosis not present

## 2018-08-04 DIAGNOSIS — E1122 Type 2 diabetes mellitus with diabetic chronic kidney disease: Secondary | ICD-10-CM | POA: Diagnosis not present

## 2018-08-04 DIAGNOSIS — I10 Essential (primary) hypertension: Secondary | ICD-10-CM | POA: Diagnosis not present

## 2018-08-04 DIAGNOSIS — E039 Hypothyroidism, unspecified: Secondary | ICD-10-CM | POA: Diagnosis not present

## 2018-08-04 DIAGNOSIS — E782 Mixed hyperlipidemia: Secondary | ICD-10-CM | POA: Diagnosis not present

## 2018-08-04 DIAGNOSIS — N183 Chronic kidney disease, stage 3 (moderate): Secondary | ICD-10-CM | POA: Diagnosis not present

## 2018-08-04 DIAGNOSIS — Z6838 Body mass index (BMI) 38.0-38.9, adult: Secondary | ICD-10-CM | POA: Diagnosis not present

## 2018-08-04 DIAGNOSIS — E1140 Type 2 diabetes mellitus with diabetic neuropathy, unspecified: Secondary | ICD-10-CM | POA: Diagnosis not present

## 2018-08-11 DIAGNOSIS — E1140 Type 2 diabetes mellitus with diabetic neuropathy, unspecified: Secondary | ICD-10-CM | POA: Diagnosis not present

## 2018-08-11 DIAGNOSIS — L97529 Non-pressure chronic ulcer of other part of left foot with unspecified severity: Secondary | ICD-10-CM | POA: Diagnosis not present

## 2018-08-11 DIAGNOSIS — L97519 Non-pressure chronic ulcer of other part of right foot with unspecified severity: Secondary | ICD-10-CM | POA: Diagnosis not present

## 2018-08-23 DIAGNOSIS — L97519 Non-pressure chronic ulcer of other part of right foot with unspecified severity: Secondary | ICD-10-CM | POA: Diagnosis not present

## 2018-08-23 DIAGNOSIS — E1140 Type 2 diabetes mellitus with diabetic neuropathy, unspecified: Secondary | ICD-10-CM | POA: Diagnosis not present

## 2018-08-23 DIAGNOSIS — L97529 Non-pressure chronic ulcer of other part of left foot with unspecified severity: Secondary | ICD-10-CM | POA: Diagnosis not present

## 2018-08-24 DIAGNOSIS — M869 Osteomyelitis, unspecified: Secondary | ICD-10-CM | POA: Diagnosis not present

## 2018-08-24 DIAGNOSIS — B9620 Unspecified Escherichia coli [E. coli] as the cause of diseases classified elsewhere: Secondary | ICD-10-CM | POA: Diagnosis present

## 2018-08-24 DIAGNOSIS — R4182 Altered mental status, unspecified: Secondary | ICD-10-CM | POA: Diagnosis present

## 2018-08-24 DIAGNOSIS — E1165 Type 2 diabetes mellitus with hyperglycemia: Secondary | ICD-10-CM | POA: Diagnosis not present

## 2018-08-24 DIAGNOSIS — S91102A Unspecified open wound of left great toe without damage to nail, initial encounter: Secondary | ICD-10-CM | POA: Diagnosis not present

## 2018-08-24 DIAGNOSIS — R5381 Other malaise: Secondary | ICD-10-CM | POA: Diagnosis not present

## 2018-08-24 DIAGNOSIS — F3340 Major depressive disorder, recurrent, in remission, unspecified: Secondary | ICD-10-CM | POA: Diagnosis not present

## 2018-08-24 DIAGNOSIS — M86172 Other acute osteomyelitis, left ankle and foot: Secondary | ICD-10-CM | POA: Diagnosis not present

## 2018-08-24 DIAGNOSIS — E039 Hypothyroidism, unspecified: Secondary | ICD-10-CM | POA: Diagnosis present

## 2018-08-24 DIAGNOSIS — I10 Essential (primary) hypertension: Secondary | ICD-10-CM | POA: Diagnosis present

## 2018-08-24 DIAGNOSIS — K219 Gastro-esophageal reflux disease without esophagitis: Secondary | ICD-10-CM | POA: Diagnosis present

## 2018-08-24 DIAGNOSIS — Z79899 Other long term (current) drug therapy: Secondary | ICD-10-CM | POA: Diagnosis not present

## 2018-08-24 DIAGNOSIS — W19XXXA Unspecified fall, initial encounter: Secondary | ICD-10-CM | POA: Diagnosis not present

## 2018-08-24 DIAGNOSIS — D509 Iron deficiency anemia, unspecified: Secondary | ICD-10-CM | POA: Diagnosis present

## 2018-08-24 DIAGNOSIS — E872 Acidosis: Secondary | ICD-10-CM | POA: Diagnosis not present

## 2018-08-24 DIAGNOSIS — Z6838 Body mass index (BMI) 38.0-38.9, adult: Secondary | ICD-10-CM | POA: Diagnosis not present

## 2018-08-24 DIAGNOSIS — E1369 Other specified diabetes mellitus with other specified complication: Secondary | ICD-10-CM | POA: Diagnosis not present

## 2018-08-24 DIAGNOSIS — S299XXA Unspecified injury of thorax, initial encounter: Secondary | ICD-10-CM | POA: Diagnosis not present

## 2018-08-24 DIAGNOSIS — Z9119 Patient's noncompliance with other medical treatment and regimen: Secondary | ICD-10-CM | POA: Diagnosis not present

## 2018-08-24 DIAGNOSIS — J9602 Acute respiratory failure with hypercapnia: Secondary | ICD-10-CM | POA: Diagnosis not present

## 2018-08-24 DIAGNOSIS — Z794 Long term (current) use of insulin: Secondary | ICD-10-CM | POA: Diagnosis not present

## 2018-08-24 DIAGNOSIS — Z87440 Personal history of urinary (tract) infections: Secondary | ICD-10-CM | POA: Diagnosis not present

## 2018-08-24 DIAGNOSIS — E11621 Type 2 diabetes mellitus with foot ulcer: Secondary | ICD-10-CM | POA: Diagnosis not present

## 2018-08-24 DIAGNOSIS — E785 Hyperlipidemia, unspecified: Secondary | ICD-10-CM | POA: Diagnosis present

## 2018-08-24 DIAGNOSIS — L97512 Non-pressure chronic ulcer of other part of right foot with fat layer exposed: Secondary | ICD-10-CM | POA: Diagnosis not present

## 2018-08-24 DIAGNOSIS — L97519 Non-pressure chronic ulcer of other part of right foot with unspecified severity: Secondary | ICD-10-CM | POA: Diagnosis not present

## 2018-08-24 DIAGNOSIS — M5136 Other intervertebral disc degeneration, lumbar region: Secondary | ICD-10-CM | POA: Diagnosis present

## 2018-08-24 DIAGNOSIS — E662 Morbid (severe) obesity with alveolar hypoventilation: Secondary | ICD-10-CM | POA: Diagnosis not present

## 2018-08-24 DIAGNOSIS — R0902 Hypoxemia: Secondary | ICD-10-CM | POA: Diagnosis not present

## 2018-08-24 DIAGNOSIS — N390 Urinary tract infection, site not specified: Secondary | ICD-10-CM | POA: Diagnosis not present

## 2018-08-24 DIAGNOSIS — E11610 Type 2 diabetes mellitus with diabetic neuropathic arthropathy: Secondary | ICD-10-CM | POA: Diagnosis present

## 2018-08-24 DIAGNOSIS — E1142 Type 2 diabetes mellitus with diabetic polyneuropathy: Secondary | ICD-10-CM | POA: Diagnosis not present

## 2018-08-24 DIAGNOSIS — R531 Weakness: Secondary | ICD-10-CM | POA: Diagnosis not present

## 2018-08-24 DIAGNOSIS — Z87891 Personal history of nicotine dependence: Secondary | ICD-10-CM | POA: Diagnosis not present

## 2018-08-24 DIAGNOSIS — Z743 Need for continuous supervision: Secondary | ICD-10-CM | POA: Diagnosis not present

## 2018-08-24 DIAGNOSIS — R296 Repeated falls: Secondary | ICD-10-CM | POA: Diagnosis not present

## 2018-08-24 DIAGNOSIS — Z23 Encounter for immunization: Secondary | ICD-10-CM | POA: Diagnosis not present

## 2018-08-24 DIAGNOSIS — E1169 Type 2 diabetes mellitus with other specified complication: Secondary | ICD-10-CM | POA: Diagnosis present

## 2018-09-02 DIAGNOSIS — R5381 Other malaise: Secondary | ICD-10-CM | POA: Diagnosis not present

## 2018-09-06 DIAGNOSIS — S299XXA Unspecified injury of thorax, initial encounter: Secondary | ICD-10-CM | POA: Diagnosis not present

## 2018-09-06 DIAGNOSIS — R0902 Hypoxemia: Secondary | ICD-10-CM | POA: Diagnosis not present

## 2018-09-06 DIAGNOSIS — R739 Hyperglycemia, unspecified: Secondary | ICD-10-CM | POA: Diagnosis not present

## 2018-09-06 DIAGNOSIS — R319 Hematuria, unspecified: Secondary | ICD-10-CM | POA: Diagnosis not present

## 2018-09-06 DIAGNOSIS — E1165 Type 2 diabetes mellitus with hyperglycemia: Secondary | ICD-10-CM | POA: Diagnosis not present

## 2018-09-06 DIAGNOSIS — R0689 Other abnormalities of breathing: Secondary | ICD-10-CM | POA: Diagnosis not present

## 2018-09-06 DIAGNOSIS — M869 Osteomyelitis, unspecified: Secondary | ICD-10-CM | POA: Diagnosis not present

## 2018-09-06 DIAGNOSIS — F3340 Major depressive disorder, recurrent, in remission, unspecified: Secondary | ICD-10-CM | POA: Diagnosis not present

## 2018-09-06 DIAGNOSIS — G4733 Obstructive sleep apnea (adult) (pediatric): Secondary | ICD-10-CM | POA: Diagnosis not present

## 2018-09-06 DIAGNOSIS — E662 Morbid (severe) obesity with alveolar hypoventilation: Secondary | ICD-10-CM | POA: Diagnosis not present

## 2018-09-06 DIAGNOSIS — R531 Weakness: Secondary | ICD-10-CM | POA: Diagnosis not present

## 2018-09-06 DIAGNOSIS — N390 Urinary tract infection, site not specified: Secondary | ICD-10-CM | POA: Diagnosis not present

## 2018-09-06 DIAGNOSIS — W19XXXA Unspecified fall, initial encounter: Secondary | ICD-10-CM | POA: Diagnosis not present

## 2018-09-09 DIAGNOSIS — E11610 Type 2 diabetes mellitus with diabetic neuropathic arthropathy: Secondary | ICD-10-CM | POA: Diagnosis present

## 2018-09-09 DIAGNOSIS — J9692 Respiratory failure, unspecified with hypercapnia: Secondary | ICD-10-CM | POA: Diagnosis not present

## 2018-09-09 DIAGNOSIS — F331 Major depressive disorder, recurrent, moderate: Secondary | ICD-10-CM | POA: Diagnosis not present

## 2018-09-09 DIAGNOSIS — R0689 Other abnormalities of breathing: Secondary | ICD-10-CM | POA: Diagnosis not present

## 2018-09-09 DIAGNOSIS — M869 Osteomyelitis, unspecified: Secondary | ICD-10-CM | POA: Diagnosis present

## 2018-09-09 DIAGNOSIS — Z6836 Body mass index (BMI) 36.0-36.9, adult: Secondary | ICD-10-CM | POA: Diagnosis not present

## 2018-09-09 DIAGNOSIS — D509 Iron deficiency anemia, unspecified: Secondary | ICD-10-CM | POA: Diagnosis present

## 2018-09-09 DIAGNOSIS — E662 Morbid (severe) obesity with alveolar hypoventilation: Secondary | ICD-10-CM | POA: Diagnosis present

## 2018-09-09 DIAGNOSIS — Z87891 Personal history of nicotine dependence: Secondary | ICD-10-CM | POA: Diagnosis not present

## 2018-09-09 DIAGNOSIS — I10 Essential (primary) hypertension: Secondary | ICD-10-CM | POA: Diagnosis present

## 2018-09-09 DIAGNOSIS — E785 Hyperlipidemia, unspecified: Secondary | ICD-10-CM | POA: Diagnosis present

## 2018-09-09 DIAGNOSIS — W19XXXA Unspecified fall, initial encounter: Secondary | ICD-10-CM | POA: Diagnosis not present

## 2018-09-09 DIAGNOSIS — E11621 Type 2 diabetes mellitus with foot ulcer: Secondary | ICD-10-CM | POA: Diagnosis present

## 2018-09-09 DIAGNOSIS — N390 Urinary tract infection, site not specified: Secondary | ICD-10-CM | POA: Diagnosis present

## 2018-09-09 DIAGNOSIS — F3340 Major depressive disorder, recurrent, in remission, unspecified: Secondary | ICD-10-CM | POA: Diagnosis present

## 2018-09-09 DIAGNOSIS — Z87440 Personal history of urinary (tract) infections: Secondary | ICD-10-CM | POA: Diagnosis not present

## 2018-09-09 DIAGNOSIS — E1142 Type 2 diabetes mellitus with diabetic polyneuropathy: Secondary | ICD-10-CM | POA: Diagnosis present

## 2018-09-09 DIAGNOSIS — R296 Repeated falls: Secondary | ICD-10-CM | POA: Diagnosis present

## 2018-09-09 DIAGNOSIS — E1169 Type 2 diabetes mellitus with other specified complication: Secondary | ICD-10-CM | POA: Diagnosis present

## 2018-09-09 DIAGNOSIS — S91102A Unspecified open wound of left great toe without damage to nail, initial encounter: Secondary | ICD-10-CM | POA: Diagnosis not present

## 2018-09-09 DIAGNOSIS — R262 Difficulty in walking, not elsewhere classified: Secondary | ICD-10-CM | POA: Diagnosis not present

## 2018-09-09 DIAGNOSIS — D631 Anemia in chronic kidney disease: Secondary | ICD-10-CM | POA: Diagnosis not present

## 2018-09-09 DIAGNOSIS — S299XXA Unspecified injury of thorax, initial encounter: Secondary | ICD-10-CM | POA: Diagnosis not present

## 2018-09-09 DIAGNOSIS — Z794 Long term (current) use of insulin: Secondary | ICD-10-CM | POA: Diagnosis not present

## 2018-09-09 DIAGNOSIS — Z79899 Other long term (current) drug therapy: Secondary | ICD-10-CM | POA: Diagnosis not present

## 2018-09-09 DIAGNOSIS — B9620 Unspecified Escherichia coli [E. coli] as the cause of diseases classified elsewhere: Secondary | ICD-10-CM | POA: Diagnosis present

## 2018-09-09 DIAGNOSIS — M6281 Muscle weakness (generalized): Secondary | ICD-10-CM | POA: Diagnosis not present

## 2018-09-09 DIAGNOSIS — E1369 Other specified diabetes mellitus with other specified complication: Secondary | ICD-10-CM | POA: Diagnosis not present

## 2018-09-09 DIAGNOSIS — R0902 Hypoxemia: Secondary | ICD-10-CM | POA: Diagnosis present

## 2018-09-09 DIAGNOSIS — K219 Gastro-esophageal reflux disease without esophagitis: Secondary | ICD-10-CM | POA: Diagnosis present

## 2018-09-09 DIAGNOSIS — E1140 Type 2 diabetes mellitus with diabetic neuropathy, unspecified: Secondary | ICD-10-CM | POA: Diagnosis not present

## 2018-09-09 DIAGNOSIS — E876 Hypokalemia: Secondary | ICD-10-CM | POA: Diagnosis present

## 2018-09-09 DIAGNOSIS — L97529 Non-pressure chronic ulcer of other part of left foot with unspecified severity: Secondary | ICD-10-CM | POA: Diagnosis present

## 2018-09-09 DIAGNOSIS — E039 Hypothyroidism, unspecified: Secondary | ICD-10-CM | POA: Diagnosis present

## 2018-09-15 DIAGNOSIS — M6281 Muscle weakness (generalized): Secondary | ICD-10-CM | POA: Diagnosis not present

## 2018-09-15 DIAGNOSIS — Z794 Long term (current) use of insulin: Secondary | ICD-10-CM | POA: Diagnosis not present

## 2018-09-15 DIAGNOSIS — S91102A Unspecified open wound of left great toe without damage to nail, initial encounter: Secondary | ICD-10-CM | POA: Diagnosis not present

## 2018-09-15 DIAGNOSIS — K219 Gastro-esophageal reflux disease without esophagitis: Secondary | ICD-10-CM | POA: Diagnosis not present

## 2018-09-15 DIAGNOSIS — Z23 Encounter for immunization: Secondary | ICD-10-CM | POA: Diagnosis not present

## 2018-09-15 DIAGNOSIS — E1142 Type 2 diabetes mellitus with diabetic polyneuropathy: Secondary | ICD-10-CM | POA: Diagnosis not present

## 2018-09-15 DIAGNOSIS — F331 Major depressive disorder, recurrent, moderate: Secondary | ICD-10-CM | POA: Diagnosis not present

## 2018-09-15 DIAGNOSIS — J9692 Respiratory failure, unspecified with hypercapnia: Secondary | ICD-10-CM | POA: Diagnosis not present

## 2018-09-15 DIAGNOSIS — N390 Urinary tract infection, site not specified: Secondary | ICD-10-CM | POA: Diagnosis not present

## 2018-09-15 DIAGNOSIS — Z87440 Personal history of urinary (tract) infections: Secondary | ICD-10-CM | POA: Diagnosis not present

## 2018-09-15 DIAGNOSIS — M869 Osteomyelitis, unspecified: Secondary | ICD-10-CM | POA: Diagnosis not present

## 2018-09-15 DIAGNOSIS — R296 Repeated falls: Secondary | ICD-10-CM | POA: Diagnosis not present

## 2018-09-15 DIAGNOSIS — E1369 Other specified diabetes mellitus with other specified complication: Secondary | ICD-10-CM | POA: Diagnosis not present

## 2018-09-15 DIAGNOSIS — D631 Anemia in chronic kidney disease: Secondary | ICD-10-CM | POA: Diagnosis not present

## 2018-09-15 DIAGNOSIS — E1140 Type 2 diabetes mellitus with diabetic neuropathy, unspecified: Secondary | ICD-10-CM | POA: Diagnosis not present

## 2018-09-15 DIAGNOSIS — R262 Difficulty in walking, not elsewhere classified: Secondary | ICD-10-CM | POA: Diagnosis not present

## 2018-09-15 DIAGNOSIS — I10 Essential (primary) hypertension: Secondary | ICD-10-CM | POA: Diagnosis not present

## 2018-09-15 DIAGNOSIS — E876 Hypokalemia: Secondary | ICD-10-CM | POA: Diagnosis not present

## 2018-09-15 DIAGNOSIS — E039 Hypothyroidism, unspecified: Secondary | ICD-10-CM | POA: Diagnosis not present

## 2018-09-15 DIAGNOSIS — G629 Polyneuropathy, unspecified: Secondary | ICD-10-CM | POA: Diagnosis not present

## 2018-09-17 DIAGNOSIS — M869 Osteomyelitis, unspecified: Secondary | ICD-10-CM | POA: Diagnosis not present

## 2018-09-17 DIAGNOSIS — E1140 Type 2 diabetes mellitus with diabetic neuropathy, unspecified: Secondary | ICD-10-CM | POA: Diagnosis not present

## 2018-09-17 DIAGNOSIS — G629 Polyneuropathy, unspecified: Secondary | ICD-10-CM | POA: Diagnosis not present

## 2018-09-17 DIAGNOSIS — I10 Essential (primary) hypertension: Secondary | ICD-10-CM | POA: Diagnosis not present

## 2018-09-23 ENCOUNTER — Other Ambulatory Visit: Payer: Self-pay | Admitting: *Deleted

## 2018-09-23 DIAGNOSIS — M869 Osteomyelitis, unspecified: Secondary | ICD-10-CM | POA: Diagnosis not present

## 2018-09-23 DIAGNOSIS — J9692 Respiratory failure, unspecified with hypercapnia: Secondary | ICD-10-CM | POA: Diagnosis not present

## 2018-09-23 DIAGNOSIS — K219 Gastro-esophageal reflux disease without esophagitis: Secondary | ICD-10-CM | POA: Diagnosis not present

## 2018-09-23 NOTE — Patient Outreach (Signed)
[facility]) Care Management  09/23/2018  Kathryn Ryan 1941/01/07 [id number]  Met with patient at facility [facility]). Patient reports she has had a lot of falls, she also has diabetes that is under "good control". She states she wants to go home soon, but is unsure if she can go back to [facility].  She states they provided meals and she had a private room.  She reports her son Louie Casa wants [facility] to assess her but she is not sold on the idea but does want to get out of the facility soon and this may be the best way to discharge at this time.   I reviewed [place] management services with patient and that we had attempted to reach her in the past unsuccessfully. Requested patient phone number but she states "I don't know what it is".  RNCM left [facility] care management packet and RNCM card.   Patient reports she does not have any care management needs at this time as she anticipates going to ALF. RNCM explained that patient son could contact RNCM if any questions or needs arise in the future.   Met with Philis Pique, SW, she states that patient may go to ALF but she did contact [facility] about reassessing patient but the person who can assess is on vacation and will not be back until next week.  RNCM explained patient is eligible for [facility] CM services and that information was left with patient.   Left packet and RNCM contact. Plan to collaborate with [facility] care team. Will sign off at this time.  Royetta Crochet. Laymond Purser, RN, BSN, [place] [phone]) Business Cell  [phone]) Toll Free Office

## 2018-09-29 DIAGNOSIS — D631 Anemia in chronic kidney disease: Secondary | ICD-10-CM | POA: Diagnosis not present

## 2018-09-29 DIAGNOSIS — I10 Essential (primary) hypertension: Secondary | ICD-10-CM | POA: Diagnosis not present

## 2018-09-29 DIAGNOSIS — M869 Osteomyelitis, unspecified: Secondary | ICD-10-CM | POA: Diagnosis not present

## 2018-09-29 DIAGNOSIS — E039 Hypothyroidism, unspecified: Secondary | ICD-10-CM | POA: Diagnosis not present

## 2018-10-02 DIAGNOSIS — Z6835 Body mass index (BMI) 35.0-35.9, adult: Secondary | ICD-10-CM | POA: Diagnosis not present

## 2018-10-02 DIAGNOSIS — Z981 Arthrodesis status: Secondary | ICD-10-CM | POA: Diagnosis not present

## 2018-10-02 DIAGNOSIS — G4733 Obstructive sleep apnea (adult) (pediatric): Secondary | ICD-10-CM | POA: Diagnosis not present

## 2018-10-02 DIAGNOSIS — M869 Osteomyelitis, unspecified: Secondary | ICD-10-CM | POA: Diagnosis not present

## 2018-10-02 DIAGNOSIS — D509 Iron deficiency anemia, unspecified: Secondary | ICD-10-CM | POA: Diagnosis not present

## 2018-10-02 DIAGNOSIS — Z87440 Personal history of urinary (tract) infections: Secondary | ICD-10-CM | POA: Diagnosis not present

## 2018-10-02 DIAGNOSIS — Z794 Long term (current) use of insulin: Secondary | ICD-10-CM | POA: Diagnosis not present

## 2018-10-02 DIAGNOSIS — Z8781 Personal history of (healed) traumatic fracture: Secondary | ICD-10-CM | POA: Diagnosis not present

## 2018-10-02 DIAGNOSIS — E1142 Type 2 diabetes mellitus with diabetic polyneuropathy: Secondary | ICD-10-CM | POA: Diagnosis not present

## 2018-10-02 DIAGNOSIS — E662 Morbid (severe) obesity with alveolar hypoventilation: Secondary | ICD-10-CM | POA: Diagnosis not present

## 2018-10-02 DIAGNOSIS — F331 Major depressive disorder, recurrent, moderate: Secondary | ICD-10-CM | POA: Diagnosis not present

## 2018-10-02 DIAGNOSIS — Z4800 Encounter for change or removal of nonsurgical wound dressing: Secondary | ICD-10-CM | POA: Diagnosis not present

## 2018-10-02 DIAGNOSIS — Z9181 History of falling: Secondary | ICD-10-CM | POA: Diagnosis not present

## 2018-10-02 DIAGNOSIS — R296 Repeated falls: Secondary | ICD-10-CM | POA: Diagnosis not present

## 2018-10-02 DIAGNOSIS — E11610 Type 2 diabetes mellitus with diabetic neuropathic arthropathy: Secondary | ICD-10-CM | POA: Diagnosis not present

## 2018-10-02 DIAGNOSIS — E1169 Type 2 diabetes mellitus with other specified complication: Secondary | ICD-10-CM | POA: Diagnosis not present

## 2018-10-02 DIAGNOSIS — I10 Essential (primary) hypertension: Secondary | ICD-10-CM | POA: Diagnosis not present

## 2018-10-02 DIAGNOSIS — Z8739 Personal history of other diseases of the musculoskeletal system and connective tissue: Secondary | ICD-10-CM | POA: Diagnosis not present

## 2018-10-02 DIAGNOSIS — M5136 Other intervertebral disc degeneration, lumbar region: Secondary | ICD-10-CM | POA: Diagnosis not present

## 2018-10-05 DIAGNOSIS — E1169 Type 2 diabetes mellitus with other specified complication: Secondary | ICD-10-CM | POA: Diagnosis not present

## 2018-10-05 DIAGNOSIS — I10 Essential (primary) hypertension: Secondary | ICD-10-CM | POA: Diagnosis not present

## 2018-10-05 DIAGNOSIS — M869 Osteomyelitis, unspecified: Secondary | ICD-10-CM | POA: Diagnosis not present

## 2018-10-05 DIAGNOSIS — E11610 Type 2 diabetes mellitus with diabetic neuropathic arthropathy: Secondary | ICD-10-CM | POA: Diagnosis not present

## 2018-10-05 DIAGNOSIS — M5136 Other intervertebral disc degeneration, lumbar region: Secondary | ICD-10-CM | POA: Diagnosis not present

## 2018-10-05 DIAGNOSIS — E1142 Type 2 diabetes mellitus with diabetic polyneuropathy: Secondary | ICD-10-CM | POA: Diagnosis not present

## 2018-10-09 DIAGNOSIS — E1142 Type 2 diabetes mellitus with diabetic polyneuropathy: Secondary | ICD-10-CM | POA: Diagnosis not present

## 2018-10-09 DIAGNOSIS — M869 Osteomyelitis, unspecified: Secondary | ICD-10-CM | POA: Diagnosis not present

## 2018-10-09 DIAGNOSIS — M5136 Other intervertebral disc degeneration, lumbar region: Secondary | ICD-10-CM | POA: Diagnosis not present

## 2018-10-09 DIAGNOSIS — I10 Essential (primary) hypertension: Secondary | ICD-10-CM | POA: Diagnosis not present

## 2018-10-09 DIAGNOSIS — E11610 Type 2 diabetes mellitus with diabetic neuropathic arthropathy: Secondary | ICD-10-CM | POA: Diagnosis not present

## 2018-10-09 DIAGNOSIS — E1169 Type 2 diabetes mellitus with other specified complication: Secondary | ICD-10-CM | POA: Diagnosis not present

## 2018-10-11 DIAGNOSIS — E1142 Type 2 diabetes mellitus with diabetic polyneuropathy: Secondary | ICD-10-CM | POA: Diagnosis not present

## 2018-10-11 DIAGNOSIS — E11610 Type 2 diabetes mellitus with diabetic neuropathic arthropathy: Secondary | ICD-10-CM | POA: Diagnosis not present

## 2018-10-11 DIAGNOSIS — M869 Osteomyelitis, unspecified: Secondary | ICD-10-CM | POA: Diagnosis not present

## 2018-10-11 DIAGNOSIS — I10 Essential (primary) hypertension: Secondary | ICD-10-CM | POA: Diagnosis not present

## 2018-10-11 DIAGNOSIS — M5136 Other intervertebral disc degeneration, lumbar region: Secondary | ICD-10-CM | POA: Diagnosis not present

## 2018-10-11 DIAGNOSIS — E1169 Type 2 diabetes mellitus with other specified complication: Secondary | ICD-10-CM | POA: Diagnosis not present

## 2018-10-12 DIAGNOSIS — S91102D Unspecified open wound of left great toe without damage to nail, subsequent encounter: Secondary | ICD-10-CM | POA: Diagnosis not present

## 2018-10-14 DIAGNOSIS — M5136 Other intervertebral disc degeneration, lumbar region: Secondary | ICD-10-CM | POA: Diagnosis not present

## 2018-10-14 DIAGNOSIS — M869 Osteomyelitis, unspecified: Secondary | ICD-10-CM | POA: Diagnosis not present

## 2018-10-14 DIAGNOSIS — E1142 Type 2 diabetes mellitus with diabetic polyneuropathy: Secondary | ICD-10-CM | POA: Diagnosis not present

## 2018-10-14 DIAGNOSIS — I10 Essential (primary) hypertension: Secondary | ICD-10-CM | POA: Diagnosis not present

## 2018-10-14 DIAGNOSIS — E11610 Type 2 diabetes mellitus with diabetic neuropathic arthropathy: Secondary | ICD-10-CM | POA: Diagnosis not present

## 2018-10-14 DIAGNOSIS — E1169 Type 2 diabetes mellitus with other specified complication: Secondary | ICD-10-CM | POA: Diagnosis not present

## 2018-10-15 DIAGNOSIS — M869 Osteomyelitis, unspecified: Secondary | ICD-10-CM | POA: Diagnosis not present

## 2018-10-15 DIAGNOSIS — E1169 Type 2 diabetes mellitus with other specified complication: Secondary | ICD-10-CM | POA: Diagnosis not present

## 2018-10-15 DIAGNOSIS — E1142 Type 2 diabetes mellitus with diabetic polyneuropathy: Secondary | ICD-10-CM | POA: Diagnosis not present

## 2018-10-15 DIAGNOSIS — E11610 Type 2 diabetes mellitus with diabetic neuropathic arthropathy: Secondary | ICD-10-CM | POA: Diagnosis not present

## 2018-10-15 DIAGNOSIS — I10 Essential (primary) hypertension: Secondary | ICD-10-CM | POA: Diagnosis not present

## 2018-10-15 DIAGNOSIS — M5136 Other intervertebral disc degeneration, lumbar region: Secondary | ICD-10-CM | POA: Diagnosis not present

## 2018-10-19 DIAGNOSIS — E1169 Type 2 diabetes mellitus with other specified complication: Secondary | ICD-10-CM | POA: Diagnosis not present

## 2018-10-19 DIAGNOSIS — E1142 Type 2 diabetes mellitus with diabetic polyneuropathy: Secondary | ICD-10-CM | POA: Diagnosis not present

## 2018-10-19 DIAGNOSIS — I10 Essential (primary) hypertension: Secondary | ICD-10-CM | POA: Diagnosis not present

## 2018-10-19 DIAGNOSIS — M5136 Other intervertebral disc degeneration, lumbar region: Secondary | ICD-10-CM | POA: Diagnosis not present

## 2018-10-19 DIAGNOSIS — M869 Osteomyelitis, unspecified: Secondary | ICD-10-CM | POA: Diagnosis not present

## 2018-10-19 DIAGNOSIS — E11610 Type 2 diabetes mellitus with diabetic neuropathic arthropathy: Secondary | ICD-10-CM | POA: Diagnosis not present

## 2018-10-20 DIAGNOSIS — M869 Osteomyelitis, unspecified: Secondary | ICD-10-CM | POA: Diagnosis not present

## 2018-10-20 DIAGNOSIS — M5136 Other intervertebral disc degeneration, lumbar region: Secondary | ICD-10-CM | POA: Diagnosis not present

## 2018-10-20 DIAGNOSIS — I10 Essential (primary) hypertension: Secondary | ICD-10-CM | POA: Diagnosis not present

## 2018-10-20 DIAGNOSIS — E11610 Type 2 diabetes mellitus with diabetic neuropathic arthropathy: Secondary | ICD-10-CM | POA: Diagnosis not present

## 2018-10-20 DIAGNOSIS — E1142 Type 2 diabetes mellitus with diabetic polyneuropathy: Secondary | ICD-10-CM | POA: Diagnosis not present

## 2018-10-20 DIAGNOSIS — E1169 Type 2 diabetes mellitus with other specified complication: Secondary | ICD-10-CM | POA: Diagnosis not present

## 2018-10-21 DIAGNOSIS — E11610 Type 2 diabetes mellitus with diabetic neuropathic arthropathy: Secondary | ICD-10-CM | POA: Diagnosis not present

## 2018-10-21 DIAGNOSIS — E1142 Type 2 diabetes mellitus with diabetic polyneuropathy: Secondary | ICD-10-CM | POA: Diagnosis not present

## 2018-10-21 DIAGNOSIS — I10 Essential (primary) hypertension: Secondary | ICD-10-CM | POA: Diagnosis not present

## 2018-10-21 DIAGNOSIS — E1169 Type 2 diabetes mellitus with other specified complication: Secondary | ICD-10-CM | POA: Diagnosis not present

## 2018-10-21 DIAGNOSIS — M5136 Other intervertebral disc degeneration, lumbar region: Secondary | ICD-10-CM | POA: Diagnosis not present

## 2018-10-21 DIAGNOSIS — M869 Osteomyelitis, unspecified: Secondary | ICD-10-CM | POA: Diagnosis not present

## 2018-10-26 DIAGNOSIS — E1142 Type 2 diabetes mellitus with diabetic polyneuropathy: Secondary | ICD-10-CM | POA: Diagnosis not present

## 2018-10-26 DIAGNOSIS — I10 Essential (primary) hypertension: Secondary | ICD-10-CM | POA: Diagnosis not present

## 2018-10-26 DIAGNOSIS — E1169 Type 2 diabetes mellitus with other specified complication: Secondary | ICD-10-CM | POA: Diagnosis not present

## 2018-10-26 DIAGNOSIS — M869 Osteomyelitis, unspecified: Secondary | ICD-10-CM | POA: Diagnosis not present

## 2018-10-26 DIAGNOSIS — E11610 Type 2 diabetes mellitus with diabetic neuropathic arthropathy: Secondary | ICD-10-CM | POA: Diagnosis not present

## 2018-10-26 DIAGNOSIS — M5136 Other intervertebral disc degeneration, lumbar region: Secondary | ICD-10-CM | POA: Diagnosis not present

## 2018-10-27 DIAGNOSIS — I10 Essential (primary) hypertension: Secondary | ICD-10-CM | POA: Diagnosis not present

## 2018-10-27 DIAGNOSIS — E1169 Type 2 diabetes mellitus with other specified complication: Secondary | ICD-10-CM | POA: Diagnosis not present

## 2018-10-27 DIAGNOSIS — M5136 Other intervertebral disc degeneration, lumbar region: Secondary | ICD-10-CM | POA: Diagnosis not present

## 2018-10-27 DIAGNOSIS — E11610 Type 2 diabetes mellitus with diabetic neuropathic arthropathy: Secondary | ICD-10-CM | POA: Diagnosis not present

## 2018-10-27 DIAGNOSIS — E1142 Type 2 diabetes mellitus with diabetic polyneuropathy: Secondary | ICD-10-CM | POA: Diagnosis not present

## 2018-10-27 DIAGNOSIS — M869 Osteomyelitis, unspecified: Secondary | ICD-10-CM | POA: Diagnosis not present

## 2018-10-29 DIAGNOSIS — M869 Osteomyelitis, unspecified: Secondary | ICD-10-CM | POA: Diagnosis not present

## 2018-10-29 DIAGNOSIS — E1142 Type 2 diabetes mellitus with diabetic polyneuropathy: Secondary | ICD-10-CM | POA: Diagnosis not present

## 2018-10-29 DIAGNOSIS — M5136 Other intervertebral disc degeneration, lumbar region: Secondary | ICD-10-CM | POA: Diagnosis not present

## 2018-10-29 DIAGNOSIS — E11610 Type 2 diabetes mellitus with diabetic neuropathic arthropathy: Secondary | ICD-10-CM | POA: Diagnosis not present

## 2018-10-29 DIAGNOSIS — I10 Essential (primary) hypertension: Secondary | ICD-10-CM | POA: Diagnosis not present

## 2018-10-29 DIAGNOSIS — E1169 Type 2 diabetes mellitus with other specified complication: Secondary | ICD-10-CM | POA: Diagnosis not present

## 2018-10-31 DIAGNOSIS — E1142 Type 2 diabetes mellitus with diabetic polyneuropathy: Secondary | ICD-10-CM | POA: Diagnosis not present

## 2018-10-31 DIAGNOSIS — E11621 Type 2 diabetes mellitus with foot ulcer: Secondary | ICD-10-CM | POA: Diagnosis not present

## 2018-10-31 DIAGNOSIS — G6289 Other specified polyneuropathies: Secondary | ICD-10-CM | POA: Diagnosis not present

## 2018-10-31 DIAGNOSIS — L97509 Non-pressure chronic ulcer of other part of unspecified foot with unspecified severity: Secondary | ICD-10-CM | POA: Diagnosis not present

## 2018-10-31 DIAGNOSIS — Z6837 Body mass index (BMI) 37.0-37.9, adult: Secondary | ICD-10-CM | POA: Diagnosis not present

## 2018-11-02 DIAGNOSIS — M869 Osteomyelitis, unspecified: Secondary | ICD-10-CM | POA: Diagnosis not present

## 2018-11-02 DIAGNOSIS — E1142 Type 2 diabetes mellitus with diabetic polyneuropathy: Secondary | ICD-10-CM | POA: Diagnosis not present

## 2018-11-02 DIAGNOSIS — M5136 Other intervertebral disc degeneration, lumbar region: Secondary | ICD-10-CM | POA: Diagnosis not present

## 2018-11-02 DIAGNOSIS — R399 Unspecified symptoms and signs involving the genitourinary system: Secondary | ICD-10-CM | POA: Diagnosis not present

## 2018-11-02 DIAGNOSIS — E11610 Type 2 diabetes mellitus with diabetic neuropathic arthropathy: Secondary | ICD-10-CM | POA: Diagnosis not present

## 2018-11-02 DIAGNOSIS — I10 Essential (primary) hypertension: Secondary | ICD-10-CM | POA: Diagnosis not present

## 2018-11-02 DIAGNOSIS — E1169 Type 2 diabetes mellitus with other specified complication: Secondary | ICD-10-CM | POA: Diagnosis not present

## 2018-11-03 DIAGNOSIS — E1169 Type 2 diabetes mellitus with other specified complication: Secondary | ICD-10-CM | POA: Diagnosis not present

## 2018-11-03 DIAGNOSIS — M869 Osteomyelitis, unspecified: Secondary | ICD-10-CM | POA: Diagnosis not present

## 2018-11-03 DIAGNOSIS — M5136 Other intervertebral disc degeneration, lumbar region: Secondary | ICD-10-CM | POA: Diagnosis not present

## 2018-11-03 DIAGNOSIS — L84 Corns and callosities: Secondary | ICD-10-CM | POA: Diagnosis not present

## 2018-11-03 DIAGNOSIS — I10 Essential (primary) hypertension: Secondary | ICD-10-CM | POA: Diagnosis not present

## 2018-11-03 DIAGNOSIS — E1142 Type 2 diabetes mellitus with diabetic polyneuropathy: Secondary | ICD-10-CM | POA: Diagnosis not present

## 2018-11-03 DIAGNOSIS — E1151 Type 2 diabetes mellitus with diabetic peripheral angiopathy without gangrene: Secondary | ICD-10-CM | POA: Diagnosis not present

## 2018-11-03 DIAGNOSIS — E11610 Type 2 diabetes mellitus with diabetic neuropathic arthropathy: Secondary | ICD-10-CM | POA: Diagnosis not present

## 2018-11-10 DIAGNOSIS — M5136 Other intervertebral disc degeneration, lumbar region: Secondary | ICD-10-CM | POA: Diagnosis not present

## 2018-11-10 DIAGNOSIS — I10 Essential (primary) hypertension: Secondary | ICD-10-CM | POA: Diagnosis not present

## 2018-11-10 DIAGNOSIS — E1169 Type 2 diabetes mellitus with other specified complication: Secondary | ICD-10-CM | POA: Diagnosis not present

## 2018-11-10 DIAGNOSIS — M869 Osteomyelitis, unspecified: Secondary | ICD-10-CM | POA: Diagnosis not present

## 2018-11-10 DIAGNOSIS — L97529 Non-pressure chronic ulcer of other part of left foot with unspecified severity: Secondary | ICD-10-CM | POA: Diagnosis not present

## 2018-11-10 DIAGNOSIS — E1140 Type 2 diabetes mellitus with diabetic neuropathy, unspecified: Secondary | ICD-10-CM | POA: Diagnosis not present

## 2018-11-10 DIAGNOSIS — M86672 Other chronic osteomyelitis, left ankle and foot: Secondary | ICD-10-CM | POA: Diagnosis not present

## 2018-11-10 DIAGNOSIS — E1142 Type 2 diabetes mellitus with diabetic polyneuropathy: Secondary | ICD-10-CM | POA: Diagnosis not present

## 2018-11-10 DIAGNOSIS — E11610 Type 2 diabetes mellitus with diabetic neuropathic arthropathy: Secondary | ICD-10-CM | POA: Diagnosis not present

## 2018-11-19 DIAGNOSIS — E1169 Type 2 diabetes mellitus with other specified complication: Secondary | ICD-10-CM | POA: Diagnosis not present

## 2018-11-19 DIAGNOSIS — M869 Osteomyelitis, unspecified: Secondary | ICD-10-CM | POA: Diagnosis not present

## 2018-11-19 DIAGNOSIS — E11610 Type 2 diabetes mellitus with diabetic neuropathic arthropathy: Secondary | ICD-10-CM | POA: Diagnosis not present

## 2018-11-19 DIAGNOSIS — I10 Essential (primary) hypertension: Secondary | ICD-10-CM | POA: Diagnosis not present

## 2018-11-19 DIAGNOSIS — E1142 Type 2 diabetes mellitus with diabetic polyneuropathy: Secondary | ICD-10-CM | POA: Diagnosis not present

## 2018-11-19 DIAGNOSIS — M5136 Other intervertebral disc degeneration, lumbar region: Secondary | ICD-10-CM | POA: Diagnosis not present

## 2018-11-23 DIAGNOSIS — I10 Essential (primary) hypertension: Secondary | ICD-10-CM | POA: Diagnosis not present

## 2018-11-23 DIAGNOSIS — E11610 Type 2 diabetes mellitus with diabetic neuropathic arthropathy: Secondary | ICD-10-CM | POA: Diagnosis not present

## 2018-11-23 DIAGNOSIS — E1169 Type 2 diabetes mellitus with other specified complication: Secondary | ICD-10-CM | POA: Diagnosis not present

## 2018-11-23 DIAGNOSIS — M869 Osteomyelitis, unspecified: Secondary | ICD-10-CM | POA: Diagnosis not present

## 2018-11-23 DIAGNOSIS — M5136 Other intervertebral disc degeneration, lumbar region: Secondary | ICD-10-CM | POA: Diagnosis not present

## 2018-11-23 DIAGNOSIS — E1142 Type 2 diabetes mellitus with diabetic polyneuropathy: Secondary | ICD-10-CM | POA: Diagnosis not present

## 2018-11-25 DIAGNOSIS — Z4800 Encounter for change or removal of nonsurgical wound dressing: Secondary | ICD-10-CM | POA: Diagnosis not present

## 2018-11-25 DIAGNOSIS — E1142 Type 2 diabetes mellitus with diabetic polyneuropathy: Secondary | ICD-10-CM | POA: Diagnosis not present

## 2018-11-25 DIAGNOSIS — Z87440 Personal history of urinary (tract) infections: Secondary | ICD-10-CM | POA: Diagnosis not present

## 2018-11-25 DIAGNOSIS — E11621 Type 2 diabetes mellitus with foot ulcer: Secondary | ICD-10-CM | POA: Diagnosis not present

## 2018-11-25 DIAGNOSIS — L97529 Non-pressure chronic ulcer of other part of left foot with unspecified severity: Secondary | ICD-10-CM | POA: Diagnosis not present

## 2018-11-25 DIAGNOSIS — Z6835 Body mass index (BMI) 35.0-35.9, adult: Secondary | ICD-10-CM | POA: Diagnosis not present

## 2018-11-25 DIAGNOSIS — E662 Morbid (severe) obesity with alveolar hypoventilation: Secondary | ICD-10-CM | POA: Diagnosis not present

## 2018-11-25 DIAGNOSIS — Z794 Long term (current) use of insulin: Secondary | ICD-10-CM | POA: Diagnosis not present

## 2018-11-25 DIAGNOSIS — F329 Major depressive disorder, single episode, unspecified: Secondary | ICD-10-CM | POA: Diagnosis not present

## 2018-11-25 DIAGNOSIS — Z9181 History of falling: Secondary | ICD-10-CM | POA: Diagnosis not present

## 2018-11-25 DIAGNOSIS — I10 Essential (primary) hypertension: Secondary | ICD-10-CM | POA: Diagnosis not present

## 2018-11-26 DIAGNOSIS — N183 Chronic kidney disease, stage 3 (moderate): Secondary | ICD-10-CM | POA: Diagnosis not present

## 2018-11-26 DIAGNOSIS — E1142 Type 2 diabetes mellitus with diabetic polyneuropathy: Secondary | ICD-10-CM | POA: Diagnosis not present

## 2018-11-26 DIAGNOSIS — E871 Hypo-osmolality and hyponatremia: Secondary | ICD-10-CM | POA: Diagnosis not present

## 2018-11-26 DIAGNOSIS — E782 Mixed hyperlipidemia: Secondary | ICD-10-CM | POA: Diagnosis not present

## 2018-11-26 DIAGNOSIS — E039 Hypothyroidism, unspecified: Secondary | ICD-10-CM | POA: Diagnosis not present

## 2018-11-26 DIAGNOSIS — E8881 Metabolic syndrome: Secondary | ICD-10-CM | POA: Diagnosis not present

## 2018-11-26 DIAGNOSIS — I10 Essential (primary) hypertension: Secondary | ICD-10-CM | POA: Diagnosis not present

## 2018-11-26 DIAGNOSIS — K210 Gastro-esophageal reflux disease with esophagitis: Secondary | ICD-10-CM | POA: Diagnosis not present

## 2018-11-26 DIAGNOSIS — D649 Anemia, unspecified: Secondary | ICD-10-CM | POA: Diagnosis not present

## 2018-11-29 DIAGNOSIS — I10 Essential (primary) hypertension: Secondary | ICD-10-CM | POA: Diagnosis not present

## 2018-11-29 DIAGNOSIS — E039 Hypothyroidism, unspecified: Secondary | ICD-10-CM | POA: Diagnosis not present

## 2018-11-29 DIAGNOSIS — E1142 Type 2 diabetes mellitus with diabetic polyneuropathy: Secondary | ICD-10-CM | POA: Diagnosis not present

## 2018-11-29 DIAGNOSIS — L97529 Non-pressure chronic ulcer of other part of left foot with unspecified severity: Secondary | ICD-10-CM | POA: Diagnosis not present

## 2018-11-29 DIAGNOSIS — E662 Morbid (severe) obesity with alveolar hypoventilation: Secondary | ICD-10-CM | POA: Diagnosis not present

## 2018-11-29 DIAGNOSIS — E8881 Metabolic syndrome: Secondary | ICD-10-CM | POA: Diagnosis not present

## 2018-11-29 DIAGNOSIS — F329 Major depressive disorder, single episode, unspecified: Secondary | ICD-10-CM | POA: Diagnosis not present

## 2018-11-29 DIAGNOSIS — E782 Mixed hyperlipidemia: Secondary | ICD-10-CM | POA: Diagnosis not present

## 2018-11-29 DIAGNOSIS — E11621 Type 2 diabetes mellitus with foot ulcer: Secondary | ICD-10-CM | POA: Diagnosis not present

## 2018-11-29 DIAGNOSIS — E1122 Type 2 diabetes mellitus with diabetic chronic kidney disease: Secondary | ICD-10-CM | POA: Diagnosis not present

## 2018-11-29 DIAGNOSIS — Z6837 Body mass index (BMI) 37.0-37.9, adult: Secondary | ICD-10-CM | POA: Diagnosis not present

## 2018-11-29 DIAGNOSIS — K7581 Nonalcoholic steatohepatitis (NASH): Secondary | ICD-10-CM | POA: Diagnosis not present

## 2018-11-29 DIAGNOSIS — E1140 Type 2 diabetes mellitus with diabetic neuropathy, unspecified: Secondary | ICD-10-CM | POA: Diagnosis not present

## 2018-12-02 DIAGNOSIS — I10 Essential (primary) hypertension: Secondary | ICD-10-CM | POA: Diagnosis not present

## 2018-12-02 DIAGNOSIS — E662 Morbid (severe) obesity with alveolar hypoventilation: Secondary | ICD-10-CM | POA: Diagnosis not present

## 2018-12-02 DIAGNOSIS — L97529 Non-pressure chronic ulcer of other part of left foot with unspecified severity: Secondary | ICD-10-CM | POA: Diagnosis not present

## 2018-12-02 DIAGNOSIS — E11621 Type 2 diabetes mellitus with foot ulcer: Secondary | ICD-10-CM | POA: Diagnosis not present

## 2018-12-02 DIAGNOSIS — F329 Major depressive disorder, single episode, unspecified: Secondary | ICD-10-CM | POA: Diagnosis not present

## 2018-12-02 DIAGNOSIS — E1142 Type 2 diabetes mellitus with diabetic polyneuropathy: Secondary | ICD-10-CM | POA: Diagnosis not present

## 2018-12-06 DIAGNOSIS — E662 Morbid (severe) obesity with alveolar hypoventilation: Secondary | ICD-10-CM | POA: Diagnosis not present

## 2018-12-06 DIAGNOSIS — E11621 Type 2 diabetes mellitus with foot ulcer: Secondary | ICD-10-CM | POA: Diagnosis not present

## 2018-12-06 DIAGNOSIS — I10 Essential (primary) hypertension: Secondary | ICD-10-CM | POA: Diagnosis not present

## 2018-12-06 DIAGNOSIS — F329 Major depressive disorder, single episode, unspecified: Secondary | ICD-10-CM | POA: Diagnosis not present

## 2018-12-06 DIAGNOSIS — L97529 Non-pressure chronic ulcer of other part of left foot with unspecified severity: Secondary | ICD-10-CM | POA: Diagnosis not present

## 2018-12-06 DIAGNOSIS — E1142 Type 2 diabetes mellitus with diabetic polyneuropathy: Secondary | ICD-10-CM | POA: Diagnosis not present

## 2018-12-09 DIAGNOSIS — E1142 Type 2 diabetes mellitus with diabetic polyneuropathy: Secondary | ICD-10-CM | POA: Diagnosis not present

## 2018-12-09 DIAGNOSIS — E662 Morbid (severe) obesity with alveolar hypoventilation: Secondary | ICD-10-CM | POA: Diagnosis not present

## 2018-12-09 DIAGNOSIS — F329 Major depressive disorder, single episode, unspecified: Secondary | ICD-10-CM | POA: Diagnosis not present

## 2018-12-09 DIAGNOSIS — I10 Essential (primary) hypertension: Secondary | ICD-10-CM | POA: Diagnosis not present

## 2018-12-09 DIAGNOSIS — E11621 Type 2 diabetes mellitus with foot ulcer: Secondary | ICD-10-CM | POA: Diagnosis not present

## 2018-12-09 DIAGNOSIS — L97529 Non-pressure chronic ulcer of other part of left foot with unspecified severity: Secondary | ICD-10-CM | POA: Diagnosis not present

## 2018-12-13 DIAGNOSIS — E11621 Type 2 diabetes mellitus with foot ulcer: Secondary | ICD-10-CM | POA: Diagnosis not present

## 2018-12-13 DIAGNOSIS — I10 Essential (primary) hypertension: Secondary | ICD-10-CM | POA: Diagnosis not present

## 2018-12-13 DIAGNOSIS — E662 Morbid (severe) obesity with alveolar hypoventilation: Secondary | ICD-10-CM | POA: Diagnosis not present

## 2018-12-13 DIAGNOSIS — E1142 Type 2 diabetes mellitus with diabetic polyneuropathy: Secondary | ICD-10-CM | POA: Diagnosis not present

## 2018-12-13 DIAGNOSIS — L97529 Non-pressure chronic ulcer of other part of left foot with unspecified severity: Secondary | ICD-10-CM | POA: Diagnosis not present

## 2018-12-13 DIAGNOSIS — F329 Major depressive disorder, single episode, unspecified: Secondary | ICD-10-CM | POA: Diagnosis not present

## 2018-12-16 DIAGNOSIS — I10 Essential (primary) hypertension: Secondary | ICD-10-CM | POA: Diagnosis not present

## 2018-12-16 DIAGNOSIS — F329 Major depressive disorder, single episode, unspecified: Secondary | ICD-10-CM | POA: Diagnosis not present

## 2018-12-16 DIAGNOSIS — E11621 Type 2 diabetes mellitus with foot ulcer: Secondary | ICD-10-CM | POA: Diagnosis not present

## 2018-12-16 DIAGNOSIS — E662 Morbid (severe) obesity with alveolar hypoventilation: Secondary | ICD-10-CM | POA: Diagnosis not present

## 2018-12-16 DIAGNOSIS — L97529 Non-pressure chronic ulcer of other part of left foot with unspecified severity: Secondary | ICD-10-CM | POA: Diagnosis not present

## 2018-12-16 DIAGNOSIS — E1142 Type 2 diabetes mellitus with diabetic polyneuropathy: Secondary | ICD-10-CM | POA: Diagnosis not present

## 2018-12-20 DIAGNOSIS — I10 Essential (primary) hypertension: Secondary | ICD-10-CM | POA: Diagnosis not present

## 2018-12-20 DIAGNOSIS — L97529 Non-pressure chronic ulcer of other part of left foot with unspecified severity: Secondary | ICD-10-CM | POA: Diagnosis not present

## 2018-12-20 DIAGNOSIS — E1142 Type 2 diabetes mellitus with diabetic polyneuropathy: Secondary | ICD-10-CM | POA: Diagnosis not present

## 2018-12-20 DIAGNOSIS — E11621 Type 2 diabetes mellitus with foot ulcer: Secondary | ICD-10-CM | POA: Diagnosis not present

## 2018-12-20 DIAGNOSIS — F329 Major depressive disorder, single episode, unspecified: Secondary | ICD-10-CM | POA: Diagnosis not present

## 2018-12-20 DIAGNOSIS — E662 Morbid (severe) obesity with alveolar hypoventilation: Secondary | ICD-10-CM | POA: Diagnosis not present

## 2018-12-21 DIAGNOSIS — E039 Hypothyroidism, unspecified: Secondary | ICD-10-CM | POA: Diagnosis not present

## 2018-12-21 DIAGNOSIS — Z79899 Other long term (current) drug therapy: Secondary | ICD-10-CM | POA: Diagnosis not present

## 2018-12-21 DIAGNOSIS — K219 Gastro-esophageal reflux disease without esophagitis: Secondary | ICD-10-CM | POA: Diagnosis not present

## 2018-12-21 DIAGNOSIS — L97528 Non-pressure chronic ulcer of other part of left foot with other specified severity: Secondary | ICD-10-CM | POA: Diagnosis not present

## 2018-12-21 DIAGNOSIS — M869 Osteomyelitis, unspecified: Secondary | ICD-10-CM | POA: Diagnosis not present

## 2018-12-21 DIAGNOSIS — E1140 Type 2 diabetes mellitus with diabetic neuropathy, unspecified: Secondary | ICD-10-CM | POA: Diagnosis not present

## 2018-12-21 DIAGNOSIS — E11621 Type 2 diabetes mellitus with foot ulcer: Secondary | ICD-10-CM | POA: Diagnosis not present

## 2018-12-21 DIAGNOSIS — M810 Age-related osteoporosis without current pathological fracture: Secondary | ICD-10-CM | POA: Diagnosis not present

## 2018-12-21 DIAGNOSIS — I10 Essential (primary) hypertension: Secondary | ICD-10-CM | POA: Diagnosis not present

## 2018-12-21 DIAGNOSIS — Z794 Long term (current) use of insulin: Secondary | ICD-10-CM | POA: Diagnosis not present

## 2018-12-21 DIAGNOSIS — M86172 Other acute osteomyelitis, left ankle and foot: Secondary | ICD-10-CM | POA: Diagnosis not present

## 2018-12-21 DIAGNOSIS — M5186 Other intervertebral disc disorders, lumbar region: Secondary | ICD-10-CM | POA: Diagnosis not present

## 2018-12-22 DIAGNOSIS — E1142 Type 2 diabetes mellitus with diabetic polyneuropathy: Secondary | ICD-10-CM | POA: Diagnosis not present

## 2018-12-22 DIAGNOSIS — L97529 Non-pressure chronic ulcer of other part of left foot with unspecified severity: Secondary | ICD-10-CM | POA: Diagnosis not present

## 2018-12-22 DIAGNOSIS — E11621 Type 2 diabetes mellitus with foot ulcer: Secondary | ICD-10-CM | POA: Diagnosis not present

## 2018-12-22 DIAGNOSIS — E662 Morbid (severe) obesity with alveolar hypoventilation: Secondary | ICD-10-CM | POA: Diagnosis not present

## 2018-12-22 DIAGNOSIS — I10 Essential (primary) hypertension: Secondary | ICD-10-CM | POA: Diagnosis not present

## 2018-12-22 DIAGNOSIS — F329 Major depressive disorder, single episode, unspecified: Secondary | ICD-10-CM | POA: Diagnosis not present

## 2018-12-24 DIAGNOSIS — L97529 Non-pressure chronic ulcer of other part of left foot with unspecified severity: Secondary | ICD-10-CM | POA: Diagnosis not present

## 2018-12-24 DIAGNOSIS — E11621 Type 2 diabetes mellitus with foot ulcer: Secondary | ICD-10-CM | POA: Diagnosis not present

## 2018-12-27 DIAGNOSIS — F329 Major depressive disorder, single episode, unspecified: Secondary | ICD-10-CM | POA: Diagnosis not present

## 2018-12-27 DIAGNOSIS — E11621 Type 2 diabetes mellitus with foot ulcer: Secondary | ICD-10-CM | POA: Diagnosis not present

## 2018-12-27 DIAGNOSIS — L97529 Non-pressure chronic ulcer of other part of left foot with unspecified severity: Secondary | ICD-10-CM | POA: Diagnosis not present

## 2018-12-27 DIAGNOSIS — I10 Essential (primary) hypertension: Secondary | ICD-10-CM | POA: Diagnosis not present

## 2018-12-27 DIAGNOSIS — E662 Morbid (severe) obesity with alveolar hypoventilation: Secondary | ICD-10-CM | POA: Diagnosis not present

## 2018-12-27 DIAGNOSIS — E1142 Type 2 diabetes mellitus with diabetic polyneuropathy: Secondary | ICD-10-CM | POA: Diagnosis not present

## 2018-12-29 DIAGNOSIS — I10 Essential (primary) hypertension: Secondary | ICD-10-CM | POA: Diagnosis not present

## 2018-12-29 DIAGNOSIS — E1142 Type 2 diabetes mellitus with diabetic polyneuropathy: Secondary | ICD-10-CM | POA: Diagnosis not present

## 2018-12-29 DIAGNOSIS — F329 Major depressive disorder, single episode, unspecified: Secondary | ICD-10-CM | POA: Diagnosis not present

## 2018-12-29 DIAGNOSIS — L97529 Non-pressure chronic ulcer of other part of left foot with unspecified severity: Secondary | ICD-10-CM | POA: Diagnosis not present

## 2018-12-29 DIAGNOSIS — E662 Morbid (severe) obesity with alveolar hypoventilation: Secondary | ICD-10-CM | POA: Diagnosis not present

## 2018-12-29 DIAGNOSIS — E11621 Type 2 diabetes mellitus with foot ulcer: Secondary | ICD-10-CM | POA: Diagnosis not present

## 2018-12-31 DIAGNOSIS — E662 Morbid (severe) obesity with alveolar hypoventilation: Secondary | ICD-10-CM | POA: Diagnosis not present

## 2018-12-31 DIAGNOSIS — I10 Essential (primary) hypertension: Secondary | ICD-10-CM | POA: Diagnosis not present

## 2018-12-31 DIAGNOSIS — E11621 Type 2 diabetes mellitus with foot ulcer: Secondary | ICD-10-CM | POA: Diagnosis not present

## 2018-12-31 DIAGNOSIS — E1142 Type 2 diabetes mellitus with diabetic polyneuropathy: Secondary | ICD-10-CM | POA: Diagnosis not present

## 2018-12-31 DIAGNOSIS — F329 Major depressive disorder, single episode, unspecified: Secondary | ICD-10-CM | POA: Diagnosis not present

## 2018-12-31 DIAGNOSIS — L97529 Non-pressure chronic ulcer of other part of left foot with unspecified severity: Secondary | ICD-10-CM | POA: Diagnosis not present

## 2019-01-03 DIAGNOSIS — L97529 Non-pressure chronic ulcer of other part of left foot with unspecified severity: Secondary | ICD-10-CM | POA: Diagnosis not present

## 2019-01-03 DIAGNOSIS — E662 Morbid (severe) obesity with alveolar hypoventilation: Secondary | ICD-10-CM | POA: Diagnosis not present

## 2019-01-03 DIAGNOSIS — F329 Major depressive disorder, single episode, unspecified: Secondary | ICD-10-CM | POA: Diagnosis not present

## 2019-01-03 DIAGNOSIS — E1142 Type 2 diabetes mellitus with diabetic polyneuropathy: Secondary | ICD-10-CM | POA: Diagnosis not present

## 2019-01-03 DIAGNOSIS — E11621 Type 2 diabetes mellitus with foot ulcer: Secondary | ICD-10-CM | POA: Diagnosis not present

## 2019-01-03 DIAGNOSIS — I10 Essential (primary) hypertension: Secondary | ICD-10-CM | POA: Diagnosis not present

## 2019-01-04 DIAGNOSIS — E039 Hypothyroidism, unspecified: Secondary | ICD-10-CM | POA: Diagnosis not present

## 2019-01-04 DIAGNOSIS — L97528 Non-pressure chronic ulcer of other part of left foot with other specified severity: Secondary | ICD-10-CM | POA: Diagnosis not present

## 2019-01-04 DIAGNOSIS — L97523 Non-pressure chronic ulcer of other part of left foot with necrosis of muscle: Secondary | ICD-10-CM | POA: Diagnosis not present

## 2019-01-04 DIAGNOSIS — I10 Essential (primary) hypertension: Secondary | ICD-10-CM | POA: Diagnosis not present

## 2019-01-04 DIAGNOSIS — M869 Osteomyelitis, unspecified: Secondary | ICD-10-CM | POA: Diagnosis not present

## 2019-01-04 DIAGNOSIS — E1140 Type 2 diabetes mellitus with diabetic neuropathy, unspecified: Secondary | ICD-10-CM | POA: Diagnosis not present

## 2019-01-04 DIAGNOSIS — E11621 Type 2 diabetes mellitus with foot ulcer: Secondary | ICD-10-CM | POA: Diagnosis not present

## 2019-01-04 DIAGNOSIS — M868X7 Other osteomyelitis, ankle and foot: Secondary | ICD-10-CM | POA: Diagnosis not present

## 2019-01-04 DIAGNOSIS — L03032 Cellulitis of left toe: Secondary | ICD-10-CM | POA: Diagnosis not present

## 2019-01-05 DIAGNOSIS — E11621 Type 2 diabetes mellitus with foot ulcer: Secondary | ICD-10-CM | POA: Diagnosis not present

## 2019-01-05 DIAGNOSIS — E1142 Type 2 diabetes mellitus with diabetic polyneuropathy: Secondary | ICD-10-CM | POA: Diagnosis not present

## 2019-01-05 DIAGNOSIS — L97529 Non-pressure chronic ulcer of other part of left foot with unspecified severity: Secondary | ICD-10-CM | POA: Diagnosis not present

## 2019-01-05 DIAGNOSIS — F329 Major depressive disorder, single episode, unspecified: Secondary | ICD-10-CM | POA: Diagnosis not present

## 2019-01-05 DIAGNOSIS — E662 Morbid (severe) obesity with alveolar hypoventilation: Secondary | ICD-10-CM | POA: Diagnosis not present

## 2019-01-05 DIAGNOSIS — I10 Essential (primary) hypertension: Secondary | ICD-10-CM | POA: Diagnosis not present

## 2019-01-07 DIAGNOSIS — E1142 Type 2 diabetes mellitus with diabetic polyneuropathy: Secondary | ICD-10-CM | POA: Diagnosis not present

## 2019-01-07 DIAGNOSIS — K7581 Nonalcoholic steatohepatitis (NASH): Secondary | ICD-10-CM | POA: Diagnosis not present

## 2019-01-07 DIAGNOSIS — E871 Hypo-osmolality and hyponatremia: Secondary | ICD-10-CM | POA: Diagnosis not present

## 2019-01-07 DIAGNOSIS — K210 Gastro-esophageal reflux disease with esophagitis: Secondary | ICD-10-CM | POA: Diagnosis not present

## 2019-01-07 DIAGNOSIS — N183 Chronic kidney disease, stage 3 (moderate): Secondary | ICD-10-CM | POA: Diagnosis not present

## 2019-01-07 DIAGNOSIS — E662 Morbid (severe) obesity with alveolar hypoventilation: Secondary | ICD-10-CM | POA: Diagnosis not present

## 2019-01-07 DIAGNOSIS — E039 Hypothyroidism, unspecified: Secondary | ICD-10-CM | POA: Diagnosis not present

## 2019-01-07 DIAGNOSIS — F329 Major depressive disorder, single episode, unspecified: Secondary | ICD-10-CM | POA: Diagnosis not present

## 2019-01-07 DIAGNOSIS — E8881 Metabolic syndrome: Secondary | ICD-10-CM | POA: Diagnosis not present

## 2019-01-07 DIAGNOSIS — E782 Mixed hyperlipidemia: Secondary | ICD-10-CM | POA: Diagnosis not present

## 2019-01-07 DIAGNOSIS — L97529 Non-pressure chronic ulcer of other part of left foot with unspecified severity: Secondary | ICD-10-CM | POA: Diagnosis not present

## 2019-01-07 DIAGNOSIS — I10 Essential (primary) hypertension: Secondary | ICD-10-CM | POA: Diagnosis not present

## 2019-01-07 DIAGNOSIS — D649 Anemia, unspecified: Secondary | ICD-10-CM | POA: Diagnosis not present

## 2019-01-07 DIAGNOSIS — E11621 Type 2 diabetes mellitus with foot ulcer: Secondary | ICD-10-CM | POA: Diagnosis not present

## 2019-01-10 DIAGNOSIS — Z0001 Encounter for general adult medical examination with abnormal findings: Secondary | ICD-10-CM | POA: Diagnosis not present

## 2019-01-10 DIAGNOSIS — F329 Major depressive disorder, single episode, unspecified: Secondary | ICD-10-CM | POA: Diagnosis not present

## 2019-01-10 DIAGNOSIS — E11621 Type 2 diabetes mellitus with foot ulcer: Secondary | ICD-10-CM | POA: Diagnosis not present

## 2019-01-10 DIAGNOSIS — Z6838 Body mass index (BMI) 38.0-38.9, adult: Secondary | ICD-10-CM | POA: Diagnosis not present

## 2019-01-10 DIAGNOSIS — I10 Essential (primary) hypertension: Secondary | ICD-10-CM | POA: Diagnosis not present

## 2019-01-10 DIAGNOSIS — E1142 Type 2 diabetes mellitus with diabetic polyneuropathy: Secondary | ICD-10-CM | POA: Diagnosis not present

## 2019-01-10 DIAGNOSIS — E662 Morbid (severe) obesity with alveolar hypoventilation: Secondary | ICD-10-CM | POA: Diagnosis not present

## 2019-01-10 DIAGNOSIS — R300 Dysuria: Secondary | ICD-10-CM | POA: Diagnosis not present

## 2019-01-10 DIAGNOSIS — L97529 Non-pressure chronic ulcer of other part of left foot with unspecified severity: Secondary | ICD-10-CM | POA: Diagnosis not present

## 2019-01-12 DIAGNOSIS — E662 Morbid (severe) obesity with alveolar hypoventilation: Secondary | ICD-10-CM | POA: Diagnosis not present

## 2019-01-12 DIAGNOSIS — E11621 Type 2 diabetes mellitus with foot ulcer: Secondary | ICD-10-CM | POA: Diagnosis not present

## 2019-01-12 DIAGNOSIS — E1142 Type 2 diabetes mellitus with diabetic polyneuropathy: Secondary | ICD-10-CM | POA: Diagnosis not present

## 2019-01-12 DIAGNOSIS — F329 Major depressive disorder, single episode, unspecified: Secondary | ICD-10-CM | POA: Diagnosis not present

## 2019-01-12 DIAGNOSIS — I10 Essential (primary) hypertension: Secondary | ICD-10-CM | POA: Diagnosis not present

## 2019-01-12 DIAGNOSIS — L97529 Non-pressure chronic ulcer of other part of left foot with unspecified severity: Secondary | ICD-10-CM | POA: Diagnosis not present

## 2019-01-14 DIAGNOSIS — I10 Essential (primary) hypertension: Secondary | ICD-10-CM | POA: Diagnosis not present

## 2019-01-14 DIAGNOSIS — E662 Morbid (severe) obesity with alveolar hypoventilation: Secondary | ICD-10-CM | POA: Diagnosis not present

## 2019-01-14 DIAGNOSIS — E1142 Type 2 diabetes mellitus with diabetic polyneuropathy: Secondary | ICD-10-CM | POA: Diagnosis not present

## 2019-01-14 DIAGNOSIS — L97529 Non-pressure chronic ulcer of other part of left foot with unspecified severity: Secondary | ICD-10-CM | POA: Diagnosis not present

## 2019-01-14 DIAGNOSIS — E11621 Type 2 diabetes mellitus with foot ulcer: Secondary | ICD-10-CM | POA: Diagnosis not present

## 2019-01-14 DIAGNOSIS — F329 Major depressive disorder, single episode, unspecified: Secondary | ICD-10-CM | POA: Diagnosis not present

## 2019-01-17 DIAGNOSIS — L97529 Non-pressure chronic ulcer of other part of left foot with unspecified severity: Secondary | ICD-10-CM | POA: Diagnosis not present

## 2019-01-17 DIAGNOSIS — I10 Essential (primary) hypertension: Secondary | ICD-10-CM | POA: Diagnosis not present

## 2019-01-17 DIAGNOSIS — F329 Major depressive disorder, single episode, unspecified: Secondary | ICD-10-CM | POA: Diagnosis not present

## 2019-01-17 DIAGNOSIS — E1142 Type 2 diabetes mellitus with diabetic polyneuropathy: Secondary | ICD-10-CM | POA: Diagnosis not present

## 2019-01-17 DIAGNOSIS — E662 Morbid (severe) obesity with alveolar hypoventilation: Secondary | ICD-10-CM | POA: Diagnosis not present

## 2019-01-17 DIAGNOSIS — E11621 Type 2 diabetes mellitus with foot ulcer: Secondary | ICD-10-CM | POA: Diagnosis not present

## 2019-01-19 DIAGNOSIS — F329 Major depressive disorder, single episode, unspecified: Secondary | ICD-10-CM | POA: Diagnosis not present

## 2019-01-19 DIAGNOSIS — E11621 Type 2 diabetes mellitus with foot ulcer: Secondary | ICD-10-CM | POA: Diagnosis not present

## 2019-01-19 DIAGNOSIS — E1142 Type 2 diabetes mellitus with diabetic polyneuropathy: Secondary | ICD-10-CM | POA: Diagnosis not present

## 2019-01-19 DIAGNOSIS — I10 Essential (primary) hypertension: Secondary | ICD-10-CM | POA: Diagnosis not present

## 2019-01-19 DIAGNOSIS — L97529 Non-pressure chronic ulcer of other part of left foot with unspecified severity: Secondary | ICD-10-CM | POA: Diagnosis not present

## 2019-01-19 DIAGNOSIS — E662 Morbid (severe) obesity with alveolar hypoventilation: Secondary | ICD-10-CM | POA: Diagnosis not present

## 2019-01-21 DIAGNOSIS — F329 Major depressive disorder, single episode, unspecified: Secondary | ICD-10-CM | POA: Diagnosis not present

## 2019-01-21 DIAGNOSIS — E1142 Type 2 diabetes mellitus with diabetic polyneuropathy: Secondary | ICD-10-CM | POA: Diagnosis not present

## 2019-01-21 DIAGNOSIS — I10 Essential (primary) hypertension: Secondary | ICD-10-CM | POA: Diagnosis not present

## 2019-01-21 DIAGNOSIS — L97529 Non-pressure chronic ulcer of other part of left foot with unspecified severity: Secondary | ICD-10-CM | POA: Diagnosis not present

## 2019-01-21 DIAGNOSIS — E662 Morbid (severe) obesity with alveolar hypoventilation: Secondary | ICD-10-CM | POA: Diagnosis not present

## 2019-01-21 DIAGNOSIS — E11621 Type 2 diabetes mellitus with foot ulcer: Secondary | ICD-10-CM | POA: Diagnosis not present

## 2019-01-24 DIAGNOSIS — Z6835 Body mass index (BMI) 35.0-35.9, adult: Secondary | ICD-10-CM | POA: Diagnosis not present

## 2019-01-24 DIAGNOSIS — Z794 Long term (current) use of insulin: Secondary | ICD-10-CM | POA: Diagnosis not present

## 2019-01-24 DIAGNOSIS — Z87440 Personal history of urinary (tract) infections: Secondary | ICD-10-CM | POA: Diagnosis not present

## 2019-01-24 DIAGNOSIS — Z4800 Encounter for change or removal of nonsurgical wound dressing: Secondary | ICD-10-CM | POA: Diagnosis not present

## 2019-01-24 DIAGNOSIS — F329 Major depressive disorder, single episode, unspecified: Secondary | ICD-10-CM | POA: Diagnosis not present

## 2019-01-24 DIAGNOSIS — E1142 Type 2 diabetes mellitus with diabetic polyneuropathy: Secondary | ICD-10-CM | POA: Diagnosis not present

## 2019-01-24 DIAGNOSIS — Z9181 History of falling: Secondary | ICD-10-CM | POA: Diagnosis not present

## 2019-01-24 DIAGNOSIS — E11621 Type 2 diabetes mellitus with foot ulcer: Secondary | ICD-10-CM | POA: Diagnosis not present

## 2019-01-24 DIAGNOSIS — I10 Essential (primary) hypertension: Secondary | ICD-10-CM | POA: Diagnosis not present

## 2019-01-24 DIAGNOSIS — E662 Morbid (severe) obesity with alveolar hypoventilation: Secondary | ICD-10-CM | POA: Diagnosis not present

## 2019-01-24 DIAGNOSIS — L97529 Non-pressure chronic ulcer of other part of left foot with unspecified severity: Secondary | ICD-10-CM | POA: Diagnosis not present

## 2019-01-25 DIAGNOSIS — L97912 Non-pressure chronic ulcer of unspecified part of right lower leg with fat layer exposed: Secondary | ICD-10-CM | POA: Diagnosis not present

## 2019-01-25 DIAGNOSIS — E669 Obesity, unspecified: Secondary | ICD-10-CM | POA: Diagnosis not present

## 2019-01-25 DIAGNOSIS — E11621 Type 2 diabetes mellitus with foot ulcer: Secondary | ICD-10-CM | POA: Diagnosis not present

## 2019-01-25 DIAGNOSIS — L97523 Non-pressure chronic ulcer of other part of left foot with necrosis of muscle: Secondary | ICD-10-CM | POA: Diagnosis not present

## 2019-01-25 DIAGNOSIS — E119 Type 2 diabetes mellitus without complications: Secondary | ICD-10-CM | POA: Diagnosis not present

## 2019-01-25 DIAGNOSIS — L97528 Non-pressure chronic ulcer of other part of left foot with other specified severity: Secondary | ICD-10-CM | POA: Diagnosis not present

## 2019-01-26 DIAGNOSIS — L84 Corns and callosities: Secondary | ICD-10-CM | POA: Diagnosis not present

## 2019-01-26 DIAGNOSIS — E1051 Type 1 diabetes mellitus with diabetic peripheral angiopathy without gangrene: Secondary | ICD-10-CM | POA: Diagnosis not present

## 2019-01-26 DIAGNOSIS — F329 Major depressive disorder, single episode, unspecified: Secondary | ICD-10-CM | POA: Diagnosis not present

## 2019-01-26 DIAGNOSIS — I10 Essential (primary) hypertension: Secondary | ICD-10-CM | POA: Diagnosis not present

## 2019-01-26 DIAGNOSIS — L97529 Non-pressure chronic ulcer of other part of left foot with unspecified severity: Secondary | ICD-10-CM | POA: Diagnosis not present

## 2019-01-26 DIAGNOSIS — E11621 Type 2 diabetes mellitus with foot ulcer: Secondary | ICD-10-CM | POA: Diagnosis not present

## 2019-01-26 DIAGNOSIS — E662 Morbid (severe) obesity with alveolar hypoventilation: Secondary | ICD-10-CM | POA: Diagnosis not present

## 2019-01-26 DIAGNOSIS — E1142 Type 2 diabetes mellitus with diabetic polyneuropathy: Secondary | ICD-10-CM | POA: Diagnosis not present

## 2019-01-28 DIAGNOSIS — L97529 Non-pressure chronic ulcer of other part of left foot with unspecified severity: Secondary | ICD-10-CM | POA: Diagnosis not present

## 2019-01-28 DIAGNOSIS — E11621 Type 2 diabetes mellitus with foot ulcer: Secondary | ICD-10-CM | POA: Diagnosis not present

## 2019-01-28 DIAGNOSIS — F329 Major depressive disorder, single episode, unspecified: Secondary | ICD-10-CM | POA: Diagnosis not present

## 2019-01-28 DIAGNOSIS — E1142 Type 2 diabetes mellitus with diabetic polyneuropathy: Secondary | ICD-10-CM | POA: Diagnosis not present

## 2019-01-28 DIAGNOSIS — E662 Morbid (severe) obesity with alveolar hypoventilation: Secondary | ICD-10-CM | POA: Diagnosis not present

## 2019-01-28 DIAGNOSIS — I10 Essential (primary) hypertension: Secondary | ICD-10-CM | POA: Diagnosis not present

## 2019-02-01 DIAGNOSIS — I10 Essential (primary) hypertension: Secondary | ICD-10-CM | POA: Diagnosis not present

## 2019-02-01 DIAGNOSIS — L97529 Non-pressure chronic ulcer of other part of left foot with unspecified severity: Secondary | ICD-10-CM | POA: Diagnosis not present

## 2019-02-01 DIAGNOSIS — E662 Morbid (severe) obesity with alveolar hypoventilation: Secondary | ICD-10-CM | POA: Diagnosis not present

## 2019-02-01 DIAGNOSIS — E11621 Type 2 diabetes mellitus with foot ulcer: Secondary | ICD-10-CM | POA: Diagnosis not present

## 2019-02-01 DIAGNOSIS — F329 Major depressive disorder, single episode, unspecified: Secondary | ICD-10-CM | POA: Diagnosis not present

## 2019-02-01 DIAGNOSIS — E1142 Type 2 diabetes mellitus with diabetic polyneuropathy: Secondary | ICD-10-CM | POA: Diagnosis not present

## 2019-02-03 DIAGNOSIS — I10 Essential (primary) hypertension: Secondary | ICD-10-CM | POA: Diagnosis not present

## 2019-02-03 DIAGNOSIS — E662 Morbid (severe) obesity with alveolar hypoventilation: Secondary | ICD-10-CM | POA: Diagnosis not present

## 2019-02-03 DIAGNOSIS — E1142 Type 2 diabetes mellitus with diabetic polyneuropathy: Secondary | ICD-10-CM | POA: Diagnosis not present

## 2019-02-03 DIAGNOSIS — L97529 Non-pressure chronic ulcer of other part of left foot with unspecified severity: Secondary | ICD-10-CM | POA: Diagnosis not present

## 2019-02-03 DIAGNOSIS — F329 Major depressive disorder, single episode, unspecified: Secondary | ICD-10-CM | POA: Diagnosis not present

## 2019-02-03 DIAGNOSIS — R300 Dysuria: Secondary | ICD-10-CM | POA: Diagnosis not present

## 2019-02-03 DIAGNOSIS — E11621 Type 2 diabetes mellitus with foot ulcer: Secondary | ICD-10-CM | POA: Diagnosis not present

## 2019-02-07 DIAGNOSIS — M170 Bilateral primary osteoarthritis of knee: Secondary | ICD-10-CM | POA: Diagnosis not present

## 2019-02-08 DIAGNOSIS — L97529 Non-pressure chronic ulcer of other part of left foot with unspecified severity: Secondary | ICD-10-CM | POA: Diagnosis not present

## 2019-02-08 DIAGNOSIS — F329 Major depressive disorder, single episode, unspecified: Secondary | ICD-10-CM | POA: Diagnosis not present

## 2019-02-08 DIAGNOSIS — E1142 Type 2 diabetes mellitus with diabetic polyneuropathy: Secondary | ICD-10-CM | POA: Diagnosis not present

## 2019-02-08 DIAGNOSIS — E11621 Type 2 diabetes mellitus with foot ulcer: Secondary | ICD-10-CM | POA: Diagnosis not present

## 2019-02-08 DIAGNOSIS — E662 Morbid (severe) obesity with alveolar hypoventilation: Secondary | ICD-10-CM | POA: Diagnosis not present

## 2019-02-08 DIAGNOSIS — I10 Essential (primary) hypertension: Secondary | ICD-10-CM | POA: Diagnosis not present

## 2019-02-11 DIAGNOSIS — L97529 Non-pressure chronic ulcer of other part of left foot with unspecified severity: Secondary | ICD-10-CM | POA: Diagnosis not present

## 2019-02-11 DIAGNOSIS — I10 Essential (primary) hypertension: Secondary | ICD-10-CM | POA: Diagnosis not present

## 2019-02-11 DIAGNOSIS — E1142 Type 2 diabetes mellitus with diabetic polyneuropathy: Secondary | ICD-10-CM | POA: Diagnosis not present

## 2019-02-11 DIAGNOSIS — E11621 Type 2 diabetes mellitus with foot ulcer: Secondary | ICD-10-CM | POA: Diagnosis not present

## 2019-02-11 DIAGNOSIS — F329 Major depressive disorder, single episode, unspecified: Secondary | ICD-10-CM | POA: Diagnosis not present

## 2019-02-11 DIAGNOSIS — E662 Morbid (severe) obesity with alveolar hypoventilation: Secondary | ICD-10-CM | POA: Diagnosis not present

## 2019-02-15 DIAGNOSIS — E1142 Type 2 diabetes mellitus with diabetic polyneuropathy: Secondary | ICD-10-CM | POA: Diagnosis not present

## 2019-02-15 DIAGNOSIS — E11621 Type 2 diabetes mellitus with foot ulcer: Secondary | ICD-10-CM | POA: Diagnosis not present

## 2019-02-15 DIAGNOSIS — I10 Essential (primary) hypertension: Secondary | ICD-10-CM | POA: Diagnosis not present

## 2019-02-15 DIAGNOSIS — E662 Morbid (severe) obesity with alveolar hypoventilation: Secondary | ICD-10-CM | POA: Diagnosis not present

## 2019-02-15 DIAGNOSIS — L97529 Non-pressure chronic ulcer of other part of left foot with unspecified severity: Secondary | ICD-10-CM | POA: Diagnosis not present

## 2019-02-15 DIAGNOSIS — F329 Major depressive disorder, single episode, unspecified: Secondary | ICD-10-CM | POA: Diagnosis not present

## 2019-02-18 DIAGNOSIS — F329 Major depressive disorder, single episode, unspecified: Secondary | ICD-10-CM | POA: Diagnosis not present

## 2019-02-18 DIAGNOSIS — L97529 Non-pressure chronic ulcer of other part of left foot with unspecified severity: Secondary | ICD-10-CM | POA: Diagnosis not present

## 2019-02-18 DIAGNOSIS — E1142 Type 2 diabetes mellitus with diabetic polyneuropathy: Secondary | ICD-10-CM | POA: Diagnosis not present

## 2019-02-18 DIAGNOSIS — I10 Essential (primary) hypertension: Secondary | ICD-10-CM | POA: Diagnosis not present

## 2019-02-18 DIAGNOSIS — E11621 Type 2 diabetes mellitus with foot ulcer: Secondary | ICD-10-CM | POA: Diagnosis not present

## 2019-02-18 DIAGNOSIS — E662 Morbid (severe) obesity with alveolar hypoventilation: Secondary | ICD-10-CM | POA: Diagnosis not present

## 2019-02-23 ENCOUNTER — Encounter (INDEPENDENT_AMBULATORY_CARE_PROVIDER_SITE_OTHER): Payer: Self-pay | Admitting: Orthopaedic Surgery

## 2019-02-23 ENCOUNTER — Ambulatory Visit (INDEPENDENT_AMBULATORY_CARE_PROVIDER_SITE_OTHER): Payer: Medicare Other

## 2019-02-23 ENCOUNTER — Ambulatory Visit (INDEPENDENT_AMBULATORY_CARE_PROVIDER_SITE_OTHER): Payer: Medicare Other | Admitting: Orthopaedic Surgery

## 2019-02-23 ENCOUNTER — Other Ambulatory Visit: Payer: Self-pay

## 2019-02-23 VITALS — BP 132/71 | HR 105 | Ht 66.0 in | Wt 230.0 lb

## 2019-02-23 DIAGNOSIS — G8929 Other chronic pain: Secondary | ICD-10-CM

## 2019-02-23 DIAGNOSIS — E1165 Type 2 diabetes mellitus with hyperglycemia: Secondary | ICD-10-CM

## 2019-02-23 DIAGNOSIS — M1711 Unilateral primary osteoarthritis, right knee: Secondary | ICD-10-CM | POA: Diagnosis not present

## 2019-02-23 DIAGNOSIS — I10 Essential (primary) hypertension: Secondary | ICD-10-CM | POA: Diagnosis not present

## 2019-02-23 DIAGNOSIS — M179 Osteoarthritis of knee, unspecified: Secondary | ICD-10-CM | POA: Insufficient documentation

## 2019-02-23 DIAGNOSIS — M25561 Pain in right knee: Secondary | ICD-10-CM

## 2019-02-23 DIAGNOSIS — M009 Pyogenic arthritis, unspecified: Secondary | ICD-10-CM | POA: Diagnosis not present

## 2019-02-23 DIAGNOSIS — E039 Hypothyroidism, unspecified: Secondary | ICD-10-CM

## 2019-02-23 MED ORDER — SODIUM HYALURONATE (VISCOSUP) 20 MG/2ML IX SOSY
20.0000 mg | PREFILLED_SYRINGE | INTRA_ARTICULAR | Status: AC | PRN
  Administered 2019-02-23: 20 mg via INTRA_ARTICULAR

## 2019-02-23 MED ORDER — LIDOCAINE HCL 1 % IJ SOLN
2.0000 mL | INTRAMUSCULAR | Status: AC | PRN
  Administered 2019-02-23: 2 mL

## 2019-02-23 NOTE — Progress Notes (Signed)
Office Visit Note   Patient: Kathryn Ryan           Date of Birth: 06/03/1941           MRN: [id number] Visit Date: 02/23/2019              Requested by: Caryl Bis, MD [place],  [id number] PCP: Caryl Bis, MD   Assessment & Plan: Visit Diagnoses:  1. Chronic pain of right knee   2. Unilateral primary osteoarthritis, right knee     Plan: Advanced finish the series osteoarthritis right knee.  Long discussion regarding diagnosis and treatment options including knee replacement.  I think she might be a good candidate for Visco supplementation.  I initiated the first Euflexxa injection today and will follow her weekly for the next 2 weeks to finish the series  Follow-Up Instructions: Return in about 1 week (around 03/02/2019).   Orders:  Orders Placed This Encounter  Procedures   Large Joint Inj: R knee   XR KNEE 3 VIEW RIGHT   No orders of the defined types were placed in this encounter.     Procedures: Large Joint Inj: R knee on 02/23/2019 11:56 AM Indications: pain and joint swelling Details: 25 G 1.5 in needle  Arthrogram: No  Medications: 2 mL lidocaine 1 %; 20 mg Sodium Hyaluronate 20 MG/2ML Outcome: tolerated well, no immediate complications Procedure, treatment alternatives, risks and benefits explained, specific risks discussed. Consent was given by the patient. Immediately prior to procedure a time out was called to verify the correct patient, procedure, equipment, support staff and site/side marked as required. Patient was prepped and draped in the usual sterile fashion.       Clinical Data: No additional findings.   Subjective: Chief Complaint  Patient presents with   Right Knee - Pain  Patient presents today with right knee pain. She said that it has hurt for months. The pain is located anteriorly and worse with weightbearing. She said that it swells and pops. She takes Ibuprofen as needed. She had a cortisone injection in her  right knee 1 month ago with Dr.Case. Kathryn Ryan lives in an assisted living facility and has had some difficulty walking any distances.  She does use a cane.  She also has been experiencing some swelling and "popping".  She is seeking another opinion  HPI.  Review of Systems   Objective: Vital Signs: BP 132/71    Pulse (!) 105    Ht 5\' 6"  (1.676 m)    Wt 230 lb (104.3 kg)    BMI 37.12 kg/m   Physical Exam Constitutional:      Appearance: She is well-developed.  Eyes:     Pupils: Pupils are equal, round, and reactive to light.  Pulmonary:     Effort: Pulmonary effort is normal.  Skin:    General: Skin is warm and dry.  Neurological:     Mental Status: She is alert and oriented to person, place, and time.  Psychiatric:        Behavior: Behavior normal.     Ortho Exam awake alert and oriented x3.  Comfortable sitting.  BMI was 37.  Very minimal effusion right knee.  No instability.  Increased varus.  Full extension and flexed almost 220 degrees.  No calf pain.  Does have an abrasion along the anterior tibia that she sustained several weeks ago.  She is diabetic but does not have any evidence of infection.  Specialty Comments:  No specialty comments available.  Imaging: Xr Knee 3 View Right  Result Date: 02/23/2019 Films of the right knee were obtained in 3 projections standing.  There are tricompartmental degenerative changes predominantly in the medial compartment where there is near bone-on-bone, subchondral sclerosis and subchondral cyst formation particularly on the femur.  There is small peripheral osteophytes.  Approximately 4 degrees of varus.  Minor degenerative changes in the lateral compartment and the patellofemoral joint.  Films are consistent with advanced osteoarthritis    PMFS History: Patient Active Problem List   Diagnosis Date Noted   Chronic pain of right knee 02/23/2019   Unilateral primary osteoarthritis, right knee 02/23/2019   Diabetes ([place])  08/04/2017   Cellulitis of left ankle 04/25/2014   Septic arthritis ([place]) 04/25/2014   Acute osteomyelitis ([place]) 04/25/2014   Hyperglycemia 04/25/2014   HTN (hypertension) 04/25/2014   Infected hardware in left leg ([place]) 04/25/2014   Past Medical History:  Diagnosis Date   Anxiety    Diabetes ([place]) 08/04/2017   Diabetes mellitus without complication ([place])    new onset diabetic    GERD (gastroesophageal reflux disease)    H/O hiatal hernia    Hypertension    Hypothyroidism    Lumbar radiculopathy    Neuropathy    non related to DM-not sure why   Neuropathy    non DM related   Spinal stenosis of lumbar region     Family History  Problem Relation Age of Onset   Breast cancer Mother    Heart disease Father        PPM    Past Surgical History:  Procedure Laterality Date   ABDOMINAL HYSTERECTOMY     BACK SURGERY     BREAST SURGERY     bilateral lumpectomies   BUNIONECTOMY     bilateral   CHOLECYSTECTOMY     HARDWARE REMOVAL Left 04/26/2014   Procedure: HARDWARE REMOVAL LEFT ANKLE;  Surgeon: Wylene Simmer, MD;  Location: [facility];  Service: Orthopedics;  Laterality: Left;   I&D EXTREMITY Left 04/26/2014   Procedure: IRRIGATION AND DEBRIDEMENT LEFT ANKLE;  Surgeon: Wylene Simmer, MD;  Location: [facility];  Service: Orthopedics;  Laterality: Left;   IRRIGATION AND DEBRIDEMENT ABSCESS Left 04/26/2014   LEFT OOPHORECTOMY     PERIPHERALLY INSERTED CENTRAL CATHETER INSERTION  04/26/2014   rt upper arm   TONSILLECTOMY     6 yrs   Social History   Occupational History   Not on file  Tobacco Use   Smoking status: Former Smoker    Packs/day: 2.00    Years: 25.00    Pack years: 50.00    Types: Cigarettes    Start date: 12/17/1968    Last attempt to quit: 02/23/1995    Years since quitting: 24.0   Smokeless tobacco: Never Used  Substance and Sexual Activity   Alcohol use: No   Drug use: No   Sexual activity: Not on file

## 2019-03-02 ENCOUNTER — Ambulatory Visit (INDEPENDENT_AMBULATORY_CARE_PROVIDER_SITE_OTHER): Payer: Medicare Other | Admitting: Orthopaedic Surgery

## 2019-03-02 ENCOUNTER — Other Ambulatory Visit: Payer: Self-pay

## 2019-07-12 DIAGNOSIS — R300 Dysuria: Secondary | ICD-10-CM | POA: Diagnosis not present

## 2019-07-28 DIAGNOSIS — R296 Repeated falls: Secondary | ICD-10-CM | POA: Diagnosis not present

## 2019-07-28 DIAGNOSIS — R300 Dysuria: Secondary | ICD-10-CM | POA: Diagnosis not present

## 2019-08-09 DIAGNOSIS — L84 Corns and callosities: Secondary | ICD-10-CM | POA: Diagnosis not present

## 2019-08-09 DIAGNOSIS — E1051 Type 1 diabetes mellitus with diabetic peripheral angiopathy without gangrene: Secondary | ICD-10-CM | POA: Diagnosis not present

## 2019-09-01 DIAGNOSIS — R300 Dysuria: Secondary | ICD-10-CM | POA: Diagnosis not present

## 2019-09-07 DIAGNOSIS — E785 Hyperlipidemia, unspecified: Secondary | ICD-10-CM | POA: Diagnosis not present

## 2019-09-07 DIAGNOSIS — I10 Essential (primary) hypertension: Secondary | ICD-10-CM | POA: Diagnosis not present

## 2019-09-16 DIAGNOSIS — M170 Bilateral primary osteoarthritis of knee: Secondary | ICD-10-CM | POA: Diagnosis not present

## 2019-09-16 DIAGNOSIS — M5410 Radiculopathy, site unspecified: Secondary | ICD-10-CM | POA: Diagnosis not present

## 2019-10-07 DIAGNOSIS — K2100 Gastro-esophageal reflux disease with esophagitis, without bleeding: Secondary | ICD-10-CM | POA: Diagnosis not present

## 2019-10-07 DIAGNOSIS — I10 Essential (primary) hypertension: Secondary | ICD-10-CM | POA: Diagnosis not present

## 2019-10-18 DIAGNOSIS — L84 Corns and callosities: Secondary | ICD-10-CM | POA: Diagnosis not present

## 2019-10-18 DIAGNOSIS — E1051 Type 1 diabetes mellitus with diabetic peripheral angiopathy without gangrene: Secondary | ICD-10-CM | POA: Diagnosis not present

## 2019-12-12 DIAGNOSIS — Z23 Encounter for immunization: Secondary | ICD-10-CM | POA: Diagnosis not present

## 2019-12-16 DIAGNOSIS — M170 Bilateral primary osteoarthritis of knee: Secondary | ICD-10-CM | POA: Diagnosis not present

## 2020-01-06 DIAGNOSIS — I10 Essential (primary) hypertension: Secondary | ICD-10-CM | POA: Diagnosis not present

## 2020-01-06 DIAGNOSIS — E7849 Other hyperlipidemia: Secondary | ICD-10-CM | POA: Diagnosis not present

## 2020-01-09 DIAGNOSIS — Z23 Encounter for immunization: Secondary | ICD-10-CM | POA: Diagnosis not present

## 2020-02-01 DIAGNOSIS — E1140 Type 2 diabetes mellitus with diabetic neuropathy, unspecified: Secondary | ICD-10-CM | POA: Diagnosis not present

## 2020-02-01 DIAGNOSIS — K219 Gastro-esophageal reflux disease without esophagitis: Secondary | ICD-10-CM | POA: Diagnosis not present

## 2020-02-01 DIAGNOSIS — N183 Chronic kidney disease, stage 3 unspecified: Secondary | ICD-10-CM | POA: Diagnosis not present

## 2020-02-01 DIAGNOSIS — K7581 Nonalcoholic steatohepatitis (NASH): Secondary | ICD-10-CM | POA: Diagnosis not present

## 2020-02-01 DIAGNOSIS — E782 Mixed hyperlipidemia: Secondary | ICD-10-CM | POA: Diagnosis not present

## 2020-02-01 DIAGNOSIS — E11621 Type 2 diabetes mellitus with foot ulcer: Secondary | ICD-10-CM | POA: Diagnosis not present

## 2020-02-01 DIAGNOSIS — Z23 Encounter for immunization: Secondary | ICD-10-CM | POA: Diagnosis not present

## 2020-02-01 DIAGNOSIS — R4582 Worries: Secondary | ICD-10-CM | POA: Diagnosis not present

## 2020-02-01 DIAGNOSIS — G6289 Other specified polyneuropathies: Secondary | ICD-10-CM | POA: Diagnosis not present

## 2020-02-01 DIAGNOSIS — E1122 Type 2 diabetes mellitus with diabetic chronic kidney disease: Secondary | ICD-10-CM | POA: Diagnosis not present

## 2020-02-01 DIAGNOSIS — E8881 Metabolic syndrome: Secondary | ICD-10-CM | POA: Diagnosis not present

## 2020-02-01 DIAGNOSIS — M810 Age-related osteoporosis without current pathological fracture: Secondary | ICD-10-CM | POA: Diagnosis not present

## 2020-02-01 DIAGNOSIS — Z1212 Encounter for screening for malignant neoplasm of rectum: Secondary | ICD-10-CM | POA: Diagnosis not present

## 2020-02-01 DIAGNOSIS — Z0001 Encounter for general adult medical examination with abnormal findings: Secondary | ICD-10-CM | POA: Diagnosis not present

## 2020-02-01 DIAGNOSIS — I10 Essential (primary) hypertension: Secondary | ICD-10-CM | POA: Diagnosis not present

## 2020-02-01 DIAGNOSIS — E039 Hypothyroidism, unspecified: Secondary | ICD-10-CM | POA: Diagnosis not present

## 2020-02-03 DIAGNOSIS — I10 Essential (primary) hypertension: Secondary | ICD-10-CM | POA: Diagnosis not present

## 2020-02-03 DIAGNOSIS — E7849 Other hyperlipidemia: Secondary | ICD-10-CM | POA: Diagnosis not present

## 2020-03-16 DIAGNOSIS — M170 Bilateral primary osteoarthritis of knee: Secondary | ICD-10-CM | POA: Diagnosis not present

## 2020-03-23 DIAGNOSIS — R309 Painful micturition, unspecified: Secondary | ICD-10-CM | POA: Diagnosis not present

## 2020-03-30 DIAGNOSIS — M1611 Unilateral primary osteoarthritis, right hip: Secondary | ICD-10-CM | POA: Diagnosis not present

## 2020-03-30 DIAGNOSIS — M25551 Pain in right hip: Secondary | ICD-10-CM | POA: Diagnosis not present

## 2020-04-01 DIAGNOSIS — W19XXXA Unspecified fall, initial encounter: Secondary | ICD-10-CM | POA: Diagnosis not present

## 2020-04-01 DIAGNOSIS — N839 Noninflammatory disorder of ovary, fallopian tube and broad ligament, unspecified: Secondary | ICD-10-CM | POA: Diagnosis not present

## 2020-04-01 DIAGNOSIS — W1839XA Other fall on same level, initial encounter: Secondary | ICD-10-CM | POA: Diagnosis not present

## 2020-04-01 DIAGNOSIS — S199XXA Unspecified injury of neck, initial encounter: Secondary | ICD-10-CM | POA: Diagnosis not present

## 2020-04-01 DIAGNOSIS — S76811A Strain of other specified muscles, fascia and tendons at thigh level, right thigh, initial encounter: Secondary | ICD-10-CM | POA: Diagnosis not present

## 2020-04-01 DIAGNOSIS — R0902 Hypoxemia: Secondary | ICD-10-CM | POA: Diagnosis not present

## 2020-04-01 DIAGNOSIS — M5136 Other intervertebral disc degeneration, lumbar region: Secondary | ICD-10-CM | POA: Diagnosis not present

## 2020-04-01 DIAGNOSIS — S79911A Unspecified injury of right hip, initial encounter: Secondary | ICD-10-CM | POA: Diagnosis not present

## 2020-04-01 DIAGNOSIS — Z20822 Contact with and (suspected) exposure to covid-19: Secondary | ICD-10-CM | POA: Diagnosis not present

## 2020-04-01 DIAGNOSIS — S7001XA Contusion of right hip, initial encounter: Secondary | ICD-10-CM | POA: Diagnosis not present

## 2020-04-01 DIAGNOSIS — R42 Dizziness and giddiness: Secondary | ICD-10-CM | POA: Diagnosis not present

## 2020-04-01 DIAGNOSIS — M79641 Pain in right hand: Secondary | ICD-10-CM | POA: Diagnosis not present

## 2020-04-01 DIAGNOSIS — R52 Pain, unspecified: Secondary | ICD-10-CM | POA: Diagnosis not present

## 2020-04-01 DIAGNOSIS — H8110 Benign paroxysmal vertigo, unspecified ear: Secondary | ICD-10-CM | POA: Diagnosis not present

## 2020-04-01 DIAGNOSIS — M48061 Spinal stenosis, lumbar region without neurogenic claudication: Secondary | ICD-10-CM | POA: Diagnosis not present

## 2020-04-01 DIAGNOSIS — M25551 Pain in right hip: Secondary | ICD-10-CM | POA: Diagnosis not present

## 2020-04-02 DIAGNOSIS — H8110 Benign paroxysmal vertigo, unspecified ear: Secondary | ICD-10-CM | POA: Diagnosis not present

## 2020-04-02 DIAGNOSIS — E1142 Type 2 diabetes mellitus with diabetic polyneuropathy: Secondary | ICD-10-CM | POA: Diagnosis not present

## 2020-04-02 DIAGNOSIS — M25551 Pain in right hip: Secondary | ICD-10-CM | POA: Diagnosis not present

## 2020-04-02 DIAGNOSIS — R296 Repeated falls: Secondary | ICD-10-CM | POA: Diagnosis not present

## 2020-04-02 DIAGNOSIS — N83201 Unspecified ovarian cyst, right side: Secondary | ICD-10-CM | POA: Diagnosis not present

## 2020-04-03 DIAGNOSIS — R296 Repeated falls: Secondary | ICD-10-CM | POA: Diagnosis not present

## 2020-04-03 DIAGNOSIS — E1142 Type 2 diabetes mellitus with diabetic polyneuropathy: Secondary | ICD-10-CM | POA: Diagnosis not present

## 2020-04-03 DIAGNOSIS — H8110 Benign paroxysmal vertigo, unspecified ear: Secondary | ICD-10-CM | POA: Diagnosis not present

## 2020-04-04 DIAGNOSIS — N83201 Unspecified ovarian cyst, right side: Secondary | ICD-10-CM | POA: Diagnosis not present

## 2020-04-04 DIAGNOSIS — E1142 Type 2 diabetes mellitus with diabetic polyneuropathy: Secondary | ICD-10-CM | POA: Diagnosis not present

## 2020-04-04 DIAGNOSIS — R296 Repeated falls: Secondary | ICD-10-CM | POA: Diagnosis not present

## 2020-04-04 DIAGNOSIS — H8110 Benign paroxysmal vertigo, unspecified ear: Secondary | ICD-10-CM | POA: Diagnosis not present

## 2020-04-04 DIAGNOSIS — M79604 Pain in right leg: Secondary | ICD-10-CM | POA: Diagnosis not present

## 2020-04-05 DIAGNOSIS — G4733 Obstructive sleep apnea (adult) (pediatric): Secondary | ICD-10-CM | POA: Diagnosis present

## 2020-04-05 DIAGNOSIS — Z87440 Personal history of urinary (tract) infections: Secondary | ICD-10-CM | POA: Diagnosis not present

## 2020-04-05 DIAGNOSIS — I129 Hypertensive chronic kidney disease with stage 1 through stage 4 chronic kidney disease, or unspecified chronic kidney disease: Secondary | ICD-10-CM | POA: Diagnosis present

## 2020-04-05 DIAGNOSIS — M545 Low back pain: Secondary | ICD-10-CM | POA: Diagnosis not present

## 2020-04-05 DIAGNOSIS — Z9119 Patient's noncompliance with other medical treatment and regimen: Secondary | ICD-10-CM | POA: Diagnosis not present

## 2020-04-05 DIAGNOSIS — Z6838 Body mass index (BMI) 38.0-38.9, adult: Secondary | ICD-10-CM | POA: Diagnosis not present

## 2020-04-05 DIAGNOSIS — S76811A Strain of other specified muscles, fascia and tendons at thigh level, right thigh, initial encounter: Secondary | ICD-10-CM | POA: Diagnosis present

## 2020-04-05 DIAGNOSIS — S3992XA Unspecified injury of lower back, initial encounter: Secondary | ICD-10-CM | POA: Diagnosis not present

## 2020-04-05 DIAGNOSIS — E1142 Type 2 diabetes mellitus with diabetic polyneuropathy: Secondary | ICD-10-CM | POA: Diagnosis present

## 2020-04-05 DIAGNOSIS — K219 Gastro-esophageal reflux disease without esophagitis: Secondary | ICD-10-CM | POA: Diagnosis present

## 2020-04-05 DIAGNOSIS — M1990 Unspecified osteoarthritis, unspecified site: Secondary | ICD-10-CM | POA: Diagnosis present

## 2020-04-05 DIAGNOSIS — N189 Chronic kidney disease, unspecified: Secondary | ICD-10-CM | POA: Diagnosis present

## 2020-04-05 DIAGNOSIS — H8110 Benign paroxysmal vertigo, unspecified ear: Secondary | ICD-10-CM | POA: Diagnosis present

## 2020-04-05 DIAGNOSIS — Z794 Long term (current) use of insulin: Secondary | ICD-10-CM | POA: Diagnosis not present

## 2020-04-05 DIAGNOSIS — E1122 Type 2 diabetes mellitus with diabetic chronic kidney disease: Secondary | ICD-10-CM | POA: Diagnosis present

## 2020-04-05 DIAGNOSIS — R296 Repeated falls: Secondary | ICD-10-CM | POA: Diagnosis present

## 2020-04-05 DIAGNOSIS — Z87891 Personal history of nicotine dependence: Secondary | ICD-10-CM | POA: Diagnosis not present

## 2020-04-05 DIAGNOSIS — E039 Hypothyroidism, unspecified: Secondary | ICD-10-CM | POA: Diagnosis present

## 2020-04-05 DIAGNOSIS — D631 Anemia in chronic kidney disease: Secondary | ICD-10-CM | POA: Diagnosis present

## 2020-04-05 DIAGNOSIS — M5136 Other intervertebral disc degeneration, lumbar region: Secondary | ICD-10-CM | POA: Diagnosis present

## 2020-04-05 DIAGNOSIS — Z981 Arthrodesis status: Secondary | ICD-10-CM | POA: Diagnosis not present

## 2020-04-05 DIAGNOSIS — D509 Iron deficiency anemia, unspecified: Secondary | ICD-10-CM | POA: Diagnosis present

## 2020-04-05 DIAGNOSIS — Z20822 Contact with and (suspected) exposure to covid-19: Secondary | ICD-10-CM | POA: Diagnosis present

## 2020-04-05 DIAGNOSIS — N839 Noninflammatory disorder of ovary, fallopian tube and broad ligament, unspecified: Secondary | ICD-10-CM | POA: Diagnosis present

## 2020-04-05 DIAGNOSIS — E785 Hyperlipidemia, unspecified: Secondary | ICD-10-CM | POA: Diagnosis present

## 2020-04-05 DIAGNOSIS — M48061 Spinal stenosis, lumbar region without neurogenic claudication: Secondary | ICD-10-CM | POA: Diagnosis present

## 2020-04-05 DIAGNOSIS — M25551 Pain in right hip: Secondary | ICD-10-CM | POA: Diagnosis not present

## 2020-04-07 DIAGNOSIS — Z87891 Personal history of nicotine dependence: Secondary | ICD-10-CM | POA: Diagnosis not present

## 2020-04-07 DIAGNOSIS — E1165 Type 2 diabetes mellitus with hyperglycemia: Secondary | ICD-10-CM | POA: Diagnosis not present

## 2020-04-07 DIAGNOSIS — D649 Anemia, unspecified: Secondary | ICD-10-CM | POA: Diagnosis not present

## 2020-04-08 DIAGNOSIS — N189 Chronic kidney disease, unspecified: Secondary | ICD-10-CM | POA: Diagnosis not present

## 2020-04-08 DIAGNOSIS — S76811D Strain of other specified muscles, fascia and tendons at thigh level, right thigh, subsequent encounter: Secondary | ICD-10-CM | POA: Diagnosis not present

## 2020-04-08 DIAGNOSIS — I129 Hypertensive chronic kidney disease with stage 1 through stage 4 chronic kidney disease, or unspecified chronic kidney disease: Secondary | ICD-10-CM | POA: Diagnosis not present

## 2020-04-08 DIAGNOSIS — Z87440 Personal history of urinary (tract) infections: Secondary | ICD-10-CM | POA: Diagnosis not present

## 2020-04-08 DIAGNOSIS — D631 Anemia in chronic kidney disease: Secondary | ICD-10-CM | POA: Diagnosis not present

## 2020-04-08 DIAGNOSIS — M5136 Other intervertebral disc degeneration, lumbar region: Secondary | ICD-10-CM | POA: Diagnosis not present

## 2020-04-08 DIAGNOSIS — S76011D Strain of muscle, fascia and tendon of right hip, subsequent encounter: Secondary | ICD-10-CM | POA: Diagnosis not present

## 2020-04-08 DIAGNOSIS — Z8781 Personal history of (healed) traumatic fracture: Secondary | ICD-10-CM | POA: Diagnosis not present

## 2020-04-08 DIAGNOSIS — Z794 Long term (current) use of insulin: Secondary | ICD-10-CM | POA: Diagnosis not present

## 2020-04-08 DIAGNOSIS — E1122 Type 2 diabetes mellitus with diabetic chronic kidney disease: Secondary | ICD-10-CM | POA: Diagnosis not present

## 2020-04-08 DIAGNOSIS — Z6838 Body mass index (BMI) 38.0-38.9, adult: Secondary | ICD-10-CM | POA: Diagnosis not present

## 2020-04-08 DIAGNOSIS — E039 Hypothyroidism, unspecified: Secondary | ICD-10-CM | POA: Diagnosis not present

## 2020-04-08 DIAGNOSIS — W19XXXD Unspecified fall, subsequent encounter: Secondary | ICD-10-CM | POA: Diagnosis not present

## 2020-04-08 DIAGNOSIS — D509 Iron deficiency anemia, unspecified: Secondary | ICD-10-CM | POA: Diagnosis not present

## 2020-04-08 DIAGNOSIS — E1140 Type 2 diabetes mellitus with diabetic neuropathy, unspecified: Secondary | ICD-10-CM | POA: Diagnosis not present

## 2020-04-08 DIAGNOSIS — E1165 Type 2 diabetes mellitus with hyperglycemia: Secondary | ICD-10-CM | POA: Diagnosis not present

## 2020-04-08 DIAGNOSIS — D3911 Neoplasm of uncertain behavior of right ovary: Secondary | ICD-10-CM | POA: Diagnosis not present

## 2020-04-08 DIAGNOSIS — Z8631 Personal history of diabetic foot ulcer: Secondary | ICD-10-CM | POA: Diagnosis not present

## 2020-04-08 DIAGNOSIS — M48061 Spinal stenosis, lumbar region without neurogenic claudication: Secondary | ICD-10-CM | POA: Diagnosis not present

## 2020-04-08 DIAGNOSIS — M1990 Unspecified osteoarthritis, unspecified site: Secondary | ICD-10-CM | POA: Diagnosis not present

## 2020-04-08 DIAGNOSIS — M47812 Spondylosis without myelopathy or radiculopathy, cervical region: Secondary | ICD-10-CM | POA: Diagnosis not present

## 2020-04-08 DIAGNOSIS — G4733 Obstructive sleep apnea (adult) (pediatric): Secondary | ICD-10-CM | POA: Diagnosis not present

## 2020-04-09 DIAGNOSIS — E1140 Type 2 diabetes mellitus with diabetic neuropathy, unspecified: Secondary | ICD-10-CM | POA: Diagnosis not present

## 2020-04-09 DIAGNOSIS — M47812 Spondylosis without myelopathy or radiculopathy, cervical region: Secondary | ICD-10-CM | POA: Diagnosis not present

## 2020-04-09 DIAGNOSIS — M5136 Other intervertebral disc degeneration, lumbar region: Secondary | ICD-10-CM | POA: Diagnosis not present

## 2020-04-09 DIAGNOSIS — M48061 Spinal stenosis, lumbar region without neurogenic claudication: Secondary | ICD-10-CM | POA: Diagnosis not present

## 2020-04-09 DIAGNOSIS — S76011D Strain of muscle, fascia and tendon of right hip, subsequent encounter: Secondary | ICD-10-CM | POA: Diagnosis not present

## 2020-04-09 DIAGNOSIS — S76811D Strain of other specified muscles, fascia and tendons at thigh level, right thigh, subsequent encounter: Secondary | ICD-10-CM | POA: Diagnosis not present

## 2020-04-10 DIAGNOSIS — M5136 Other intervertebral disc degeneration, lumbar region: Secondary | ICD-10-CM | POA: Diagnosis not present

## 2020-04-10 DIAGNOSIS — M47812 Spondylosis without myelopathy or radiculopathy, cervical region: Secondary | ICD-10-CM | POA: Diagnosis not present

## 2020-04-10 DIAGNOSIS — M48061 Spinal stenosis, lumbar region without neurogenic claudication: Secondary | ICD-10-CM | POA: Diagnosis not present

## 2020-04-10 DIAGNOSIS — E1140 Type 2 diabetes mellitus with diabetic neuropathy, unspecified: Secondary | ICD-10-CM | POA: Diagnosis not present

## 2020-04-10 DIAGNOSIS — S76011D Strain of muscle, fascia and tendon of right hip, subsequent encounter: Secondary | ICD-10-CM | POA: Diagnosis not present

## 2020-04-10 DIAGNOSIS — S76811D Strain of other specified muscles, fascia and tendons at thigh level, right thigh, subsequent encounter: Secondary | ICD-10-CM | POA: Diagnosis not present

## 2020-04-11 DIAGNOSIS — S76811D Strain of other specified muscles, fascia and tendons at thigh level, right thigh, subsequent encounter: Secondary | ICD-10-CM | POA: Diagnosis not present

## 2020-04-11 DIAGNOSIS — M48061 Spinal stenosis, lumbar region without neurogenic claudication: Secondary | ICD-10-CM | POA: Diagnosis not present

## 2020-04-11 DIAGNOSIS — M47812 Spondylosis without myelopathy or radiculopathy, cervical region: Secondary | ICD-10-CM | POA: Diagnosis not present

## 2020-04-11 DIAGNOSIS — S76011D Strain of muscle, fascia and tendon of right hip, subsequent encounter: Secondary | ICD-10-CM | POA: Diagnosis not present

## 2020-04-11 DIAGNOSIS — E1140 Type 2 diabetes mellitus with diabetic neuropathy, unspecified: Secondary | ICD-10-CM | POA: Diagnosis not present

## 2020-04-11 DIAGNOSIS — M5136 Other intervertebral disc degeneration, lumbar region: Secondary | ICD-10-CM | POA: Diagnosis not present

## 2020-04-17 DIAGNOSIS — E1140 Type 2 diabetes mellitus with diabetic neuropathy, unspecified: Secondary | ICD-10-CM | POA: Diagnosis not present

## 2020-04-17 DIAGNOSIS — M47812 Spondylosis without myelopathy or radiculopathy, cervical region: Secondary | ICD-10-CM | POA: Diagnosis not present

## 2020-04-17 DIAGNOSIS — M48061 Spinal stenosis, lumbar region without neurogenic claudication: Secondary | ICD-10-CM | POA: Diagnosis not present

## 2020-04-17 DIAGNOSIS — M5136 Other intervertebral disc degeneration, lumbar region: Secondary | ICD-10-CM | POA: Diagnosis not present

## 2020-04-17 DIAGNOSIS — S76011D Strain of muscle, fascia and tendon of right hip, subsequent encounter: Secondary | ICD-10-CM | POA: Diagnosis not present

## 2020-04-17 DIAGNOSIS — S76811D Strain of other specified muscles, fascia and tendons at thigh level, right thigh, subsequent encounter: Secondary | ICD-10-CM | POA: Diagnosis not present

## 2020-04-18 DIAGNOSIS — S76811D Strain of other specified muscles, fascia and tendons at thigh level, right thigh, subsequent encounter: Secondary | ICD-10-CM | POA: Diagnosis not present

## 2020-04-18 DIAGNOSIS — M48061 Spinal stenosis, lumbar region without neurogenic claudication: Secondary | ICD-10-CM | POA: Diagnosis not present

## 2020-04-18 DIAGNOSIS — S76011D Strain of muscle, fascia and tendon of right hip, subsequent encounter: Secondary | ICD-10-CM | POA: Diagnosis not present

## 2020-04-18 DIAGNOSIS — M5136 Other intervertebral disc degeneration, lumbar region: Secondary | ICD-10-CM | POA: Diagnosis not present

## 2020-04-18 DIAGNOSIS — E1140 Type 2 diabetes mellitus with diabetic neuropathy, unspecified: Secondary | ICD-10-CM | POA: Diagnosis not present

## 2020-04-18 DIAGNOSIS — M47812 Spondylosis without myelopathy or radiculopathy, cervical region: Secondary | ICD-10-CM | POA: Diagnosis not present

## 2020-04-19 DIAGNOSIS — M5136 Other intervertebral disc degeneration, lumbar region: Secondary | ICD-10-CM | POA: Diagnosis not present

## 2020-04-19 DIAGNOSIS — S76811D Strain of other specified muscles, fascia and tendons at thigh level, right thigh, subsequent encounter: Secondary | ICD-10-CM | POA: Diagnosis not present

## 2020-04-19 DIAGNOSIS — S76011D Strain of muscle, fascia and tendon of right hip, subsequent encounter: Secondary | ICD-10-CM | POA: Diagnosis not present

## 2020-04-19 DIAGNOSIS — M48061 Spinal stenosis, lumbar region without neurogenic claudication: Secondary | ICD-10-CM | POA: Diagnosis not present

## 2020-04-19 DIAGNOSIS — M47812 Spondylosis without myelopathy or radiculopathy, cervical region: Secondary | ICD-10-CM | POA: Diagnosis not present

## 2020-04-19 DIAGNOSIS — E1140 Type 2 diabetes mellitus with diabetic neuropathy, unspecified: Secondary | ICD-10-CM | POA: Diagnosis not present

## 2020-04-20 DIAGNOSIS — M5136 Other intervertebral disc degeneration, lumbar region: Secondary | ICD-10-CM | POA: Diagnosis not present

## 2020-04-20 DIAGNOSIS — M48061 Spinal stenosis, lumbar region without neurogenic claudication: Secondary | ICD-10-CM | POA: Diagnosis not present

## 2020-04-20 DIAGNOSIS — E1140 Type 2 diabetes mellitus with diabetic neuropathy, unspecified: Secondary | ICD-10-CM | POA: Diagnosis not present

## 2020-04-20 DIAGNOSIS — S76811D Strain of other specified muscles, fascia and tendons at thigh level, right thigh, subsequent encounter: Secondary | ICD-10-CM | POA: Diagnosis not present

## 2020-04-20 DIAGNOSIS — M47812 Spondylosis without myelopathy or radiculopathy, cervical region: Secondary | ICD-10-CM | POA: Diagnosis not present

## 2020-04-20 DIAGNOSIS — S76011D Strain of muscle, fascia and tendon of right hip, subsequent encounter: Secondary | ICD-10-CM | POA: Diagnosis not present

## 2020-04-24 DIAGNOSIS — M48061 Spinal stenosis, lumbar region without neurogenic claudication: Secondary | ICD-10-CM | POA: Diagnosis not present

## 2020-04-24 DIAGNOSIS — S76011D Strain of muscle, fascia and tendon of right hip, subsequent encounter: Secondary | ICD-10-CM | POA: Diagnosis not present

## 2020-04-24 DIAGNOSIS — E1140 Type 2 diabetes mellitus with diabetic neuropathy, unspecified: Secondary | ICD-10-CM | POA: Diagnosis not present

## 2020-04-24 DIAGNOSIS — S76811D Strain of other specified muscles, fascia and tendons at thigh level, right thigh, subsequent encounter: Secondary | ICD-10-CM | POA: Diagnosis not present

## 2020-04-24 DIAGNOSIS — M47812 Spondylosis without myelopathy or radiculopathy, cervical region: Secondary | ICD-10-CM | POA: Diagnosis not present

## 2020-04-24 DIAGNOSIS — M5136 Other intervertebral disc degeneration, lumbar region: Secondary | ICD-10-CM | POA: Diagnosis not present

## 2020-04-26 DIAGNOSIS — S76011D Strain of muscle, fascia and tendon of right hip, subsequent encounter: Secondary | ICD-10-CM | POA: Diagnosis not present

## 2020-04-26 DIAGNOSIS — E1140 Type 2 diabetes mellitus with diabetic neuropathy, unspecified: Secondary | ICD-10-CM | POA: Diagnosis not present

## 2020-04-26 DIAGNOSIS — M5136 Other intervertebral disc degeneration, lumbar region: Secondary | ICD-10-CM | POA: Diagnosis not present

## 2020-04-26 DIAGNOSIS — M47812 Spondylosis without myelopathy or radiculopathy, cervical region: Secondary | ICD-10-CM | POA: Diagnosis not present

## 2020-04-26 DIAGNOSIS — M48061 Spinal stenosis, lumbar region without neurogenic claudication: Secondary | ICD-10-CM | POA: Diagnosis not present

## 2020-04-26 DIAGNOSIS — S76811D Strain of other specified muscles, fascia and tendons at thigh level, right thigh, subsequent encounter: Secondary | ICD-10-CM | POA: Diagnosis not present

## 2020-04-27 DIAGNOSIS — M5136 Other intervertebral disc degeneration, lumbar region: Secondary | ICD-10-CM | POA: Diagnosis not present

## 2020-04-27 DIAGNOSIS — M48061 Spinal stenosis, lumbar region without neurogenic claudication: Secondary | ICD-10-CM | POA: Diagnosis not present

## 2020-04-27 DIAGNOSIS — M47812 Spondylosis without myelopathy or radiculopathy, cervical region: Secondary | ICD-10-CM | POA: Diagnosis not present

## 2020-04-27 DIAGNOSIS — S76011D Strain of muscle, fascia and tendon of right hip, subsequent encounter: Secondary | ICD-10-CM | POA: Diagnosis not present

## 2020-04-27 DIAGNOSIS — S76811D Strain of other specified muscles, fascia and tendons at thigh level, right thigh, subsequent encounter: Secondary | ICD-10-CM | POA: Diagnosis not present

## 2020-04-27 DIAGNOSIS — E1140 Type 2 diabetes mellitus with diabetic neuropathy, unspecified: Secondary | ICD-10-CM | POA: Diagnosis not present

## 2020-05-01 DIAGNOSIS — S76011D Strain of muscle, fascia and tendon of right hip, subsequent encounter: Secondary | ICD-10-CM | POA: Diagnosis not present

## 2020-05-01 DIAGNOSIS — S76811D Strain of other specified muscles, fascia and tendons at thigh level, right thigh, subsequent encounter: Secondary | ICD-10-CM | POA: Diagnosis not present

## 2020-05-01 DIAGNOSIS — M48061 Spinal stenosis, lumbar region without neurogenic claudication: Secondary | ICD-10-CM | POA: Diagnosis not present

## 2020-05-01 DIAGNOSIS — M47812 Spondylosis without myelopathy or radiculopathy, cervical region: Secondary | ICD-10-CM | POA: Diagnosis not present

## 2020-05-01 DIAGNOSIS — E1140 Type 2 diabetes mellitus with diabetic neuropathy, unspecified: Secondary | ICD-10-CM | POA: Diagnosis not present

## 2020-05-01 DIAGNOSIS — M5136 Other intervertebral disc degeneration, lumbar region: Secondary | ICD-10-CM | POA: Diagnosis not present

## 2020-05-02 DIAGNOSIS — E1142 Type 2 diabetes mellitus with diabetic polyneuropathy: Secondary | ICD-10-CM | POA: Diagnosis not present

## 2020-05-02 DIAGNOSIS — F331 Major depressive disorder, recurrent, moderate: Secondary | ICD-10-CM | POA: Diagnosis not present

## 2020-05-04 DIAGNOSIS — E1140 Type 2 diabetes mellitus with diabetic neuropathy, unspecified: Secondary | ICD-10-CM | POA: Diagnosis not present

## 2020-05-04 DIAGNOSIS — M5136 Other intervertebral disc degeneration, lumbar region: Secondary | ICD-10-CM | POA: Diagnosis not present

## 2020-05-04 DIAGNOSIS — S76811D Strain of other specified muscles, fascia and tendons at thigh level, right thigh, subsequent encounter: Secondary | ICD-10-CM | POA: Diagnosis not present

## 2020-05-04 DIAGNOSIS — M48061 Spinal stenosis, lumbar region without neurogenic claudication: Secondary | ICD-10-CM | POA: Diagnosis not present

## 2020-05-04 DIAGNOSIS — S76011D Strain of muscle, fascia and tendon of right hip, subsequent encounter: Secondary | ICD-10-CM | POA: Diagnosis not present

## 2020-05-04 DIAGNOSIS — M47812 Spondylosis without myelopathy or radiculopathy, cervical region: Secondary | ICD-10-CM | POA: Diagnosis not present

## 2020-05-06 DIAGNOSIS — M5136 Other intervertebral disc degeneration, lumbar region: Secondary | ICD-10-CM | POA: Diagnosis not present

## 2020-05-06 DIAGNOSIS — Z6837 Body mass index (BMI) 37.0-37.9, adult: Secondary | ICD-10-CM | POA: Diagnosis not present

## 2020-05-06 DIAGNOSIS — M1611 Unilateral primary osteoarthritis, right hip: Secondary | ICD-10-CM | POA: Diagnosis not present

## 2020-05-06 DIAGNOSIS — H8110 Benign paroxysmal vertigo, unspecified ear: Secondary | ICD-10-CM | POA: Diagnosis not present

## 2020-05-07 DIAGNOSIS — E7849 Other hyperlipidemia: Secondary | ICD-10-CM | POA: Diagnosis not present

## 2020-05-07 DIAGNOSIS — N183 Chronic kidney disease, stage 3 unspecified: Secondary | ICD-10-CM | POA: Diagnosis not present

## 2020-05-07 DIAGNOSIS — I129 Hypertensive chronic kidney disease with stage 1 through stage 4 chronic kidney disease, or unspecified chronic kidney disease: Secondary | ICD-10-CM | POA: Diagnosis not present

## 2020-05-07 DIAGNOSIS — E1122 Type 2 diabetes mellitus with diabetic chronic kidney disease: Secondary | ICD-10-CM | POA: Diagnosis not present

## 2020-05-08 DIAGNOSIS — D3911 Neoplasm of uncertain behavior of right ovary: Secondary | ICD-10-CM | POA: Diagnosis not present

## 2020-05-08 DIAGNOSIS — G4733 Obstructive sleep apnea (adult) (pediatric): Secondary | ICD-10-CM | POA: Diagnosis not present

## 2020-05-08 DIAGNOSIS — Z6838 Body mass index (BMI) 38.0-38.9, adult: Secondary | ICD-10-CM | POA: Diagnosis not present

## 2020-05-08 DIAGNOSIS — M5136 Other intervertebral disc degeneration, lumbar region: Secondary | ICD-10-CM | POA: Diagnosis not present

## 2020-05-08 DIAGNOSIS — E1140 Type 2 diabetes mellitus with diabetic neuropathy, unspecified: Secondary | ICD-10-CM | POA: Diagnosis not present

## 2020-05-08 DIAGNOSIS — S76811D Strain of other specified muscles, fascia and tendons at thigh level, right thigh, subsequent encounter: Secondary | ICD-10-CM | POA: Diagnosis not present

## 2020-05-08 DIAGNOSIS — M1990 Unspecified osteoarthritis, unspecified site: Secondary | ICD-10-CM | POA: Diagnosis not present

## 2020-05-08 DIAGNOSIS — Z8631 Personal history of diabetic foot ulcer: Secondary | ICD-10-CM | POA: Diagnosis not present

## 2020-05-08 DIAGNOSIS — M47812 Spondylosis without myelopathy or radiculopathy, cervical region: Secondary | ICD-10-CM | POA: Diagnosis not present

## 2020-05-08 DIAGNOSIS — E1122 Type 2 diabetes mellitus with diabetic chronic kidney disease: Secondary | ICD-10-CM | POA: Diagnosis not present

## 2020-05-08 DIAGNOSIS — D631 Anemia in chronic kidney disease: Secondary | ICD-10-CM | POA: Diagnosis not present

## 2020-05-08 DIAGNOSIS — E039 Hypothyroidism, unspecified: Secondary | ICD-10-CM | POA: Diagnosis not present

## 2020-05-08 DIAGNOSIS — D509 Iron deficiency anemia, unspecified: Secondary | ICD-10-CM | POA: Diagnosis not present

## 2020-05-08 DIAGNOSIS — Z87440 Personal history of urinary (tract) infections: Secondary | ICD-10-CM | POA: Diagnosis not present

## 2020-05-08 DIAGNOSIS — I129 Hypertensive chronic kidney disease with stage 1 through stage 4 chronic kidney disease, or unspecified chronic kidney disease: Secondary | ICD-10-CM | POA: Diagnosis not present

## 2020-05-08 DIAGNOSIS — E1165 Type 2 diabetes mellitus with hyperglycemia: Secondary | ICD-10-CM | POA: Diagnosis not present

## 2020-05-08 DIAGNOSIS — Z794 Long term (current) use of insulin: Secondary | ICD-10-CM | POA: Diagnosis not present

## 2020-05-08 DIAGNOSIS — M48061 Spinal stenosis, lumbar region without neurogenic claudication: Secondary | ICD-10-CM | POA: Diagnosis not present

## 2020-05-08 DIAGNOSIS — S76011D Strain of muscle, fascia and tendon of right hip, subsequent encounter: Secondary | ICD-10-CM | POA: Diagnosis not present

## 2020-05-08 DIAGNOSIS — N189 Chronic kidney disease, unspecified: Secondary | ICD-10-CM | POA: Diagnosis not present

## 2020-05-08 DIAGNOSIS — W19XXXD Unspecified fall, subsequent encounter: Secondary | ICD-10-CM | POA: Diagnosis not present

## 2020-05-08 DIAGNOSIS — Z8781 Personal history of (healed) traumatic fracture: Secondary | ICD-10-CM | POA: Diagnosis not present

## 2020-06-02 DIAGNOSIS — Q998 Other specified chromosome abnormalities: Secondary | ICD-10-CM | POA: Diagnosis not present

## 2020-06-02 DIAGNOSIS — R52 Pain, unspecified: Secondary | ICD-10-CM | POA: Diagnosis not present

## 2020-06-02 DIAGNOSIS — R1110 Vomiting, unspecified: Secondary | ICD-10-CM | POA: Diagnosis not present

## 2020-06-02 DIAGNOSIS — R32 Unspecified urinary incontinence: Secondary | ICD-10-CM | POA: Diagnosis not present

## 2020-06-18 DIAGNOSIS — M170 Bilateral primary osteoarthritis of knee: Secondary | ICD-10-CM | POA: Diagnosis not present

## 2020-06-19 DIAGNOSIS — E1051 Type 1 diabetes mellitus with diabetic peripheral angiopathy without gangrene: Secondary | ICD-10-CM | POA: Diagnosis not present

## 2020-06-19 DIAGNOSIS — L84 Corns and callosities: Secondary | ICD-10-CM | POA: Diagnosis not present

## 2020-07-06 DIAGNOSIS — E1122 Type 2 diabetes mellitus with diabetic chronic kidney disease: Secondary | ICD-10-CM | POA: Diagnosis not present

## 2020-07-06 DIAGNOSIS — I129 Hypertensive chronic kidney disease with stage 1 through stage 4 chronic kidney disease, or unspecified chronic kidney disease: Secondary | ICD-10-CM | POA: Diagnosis not present

## 2020-07-06 DIAGNOSIS — N183 Chronic kidney disease, stage 3 unspecified: Secondary | ICD-10-CM | POA: Diagnosis not present

## 2020-07-06 DIAGNOSIS — E7849 Other hyperlipidemia: Secondary | ICD-10-CM | POA: Diagnosis not present

## 2020-08-06 DIAGNOSIS — I10 Essential (primary) hypertension: Secondary | ICD-10-CM | POA: Diagnosis not present

## 2020-08-06 DIAGNOSIS — E1142 Type 2 diabetes mellitus with diabetic polyneuropathy: Secondary | ICD-10-CM | POA: Diagnosis not present

## 2020-08-06 DIAGNOSIS — K219 Gastro-esophageal reflux disease without esophagitis: Secondary | ICD-10-CM | POA: Diagnosis not present

## 2020-08-06 DIAGNOSIS — E039 Hypothyroidism, unspecified: Secondary | ICD-10-CM | POA: Diagnosis not present

## 2020-08-06 DIAGNOSIS — E8881 Metabolic syndrome: Secondary | ICD-10-CM | POA: Diagnosis not present

## 2020-08-06 DIAGNOSIS — E782 Mixed hyperlipidemia: Secondary | ICD-10-CM | POA: Diagnosis not present

## 2020-08-06 DIAGNOSIS — E1122 Type 2 diabetes mellitus with diabetic chronic kidney disease: Secondary | ICD-10-CM | POA: Diagnosis not present

## 2020-08-06 DIAGNOSIS — N183 Chronic kidney disease, stage 3 unspecified: Secondary | ICD-10-CM | POA: Diagnosis not present

## 2020-08-07 DIAGNOSIS — E7849 Other hyperlipidemia: Secondary | ICD-10-CM | POA: Diagnosis not present

## 2020-08-07 DIAGNOSIS — I129 Hypertensive chronic kidney disease with stage 1 through stage 4 chronic kidney disease, or unspecified chronic kidney disease: Secondary | ICD-10-CM | POA: Diagnosis not present

## 2020-08-07 DIAGNOSIS — E1122 Type 2 diabetes mellitus with diabetic chronic kidney disease: Secondary | ICD-10-CM | POA: Diagnosis not present

## 2020-08-07 DIAGNOSIS — N183 Chronic kidney disease, stage 3 unspecified: Secondary | ICD-10-CM | POA: Diagnosis not present

## 2020-08-10 DIAGNOSIS — M810 Age-related osteoporosis without current pathological fracture: Secondary | ICD-10-CM | POA: Diagnosis not present

## 2020-08-10 DIAGNOSIS — Z23 Encounter for immunization: Secondary | ICD-10-CM | POA: Diagnosis not present

## 2020-08-10 DIAGNOSIS — K219 Gastro-esophageal reflux disease without esophagitis: Secondary | ICD-10-CM | POA: Diagnosis not present

## 2020-08-10 DIAGNOSIS — Z1212 Encounter for screening for malignant neoplasm of rectum: Secondary | ICD-10-CM | POA: Diagnosis not present

## 2020-08-10 DIAGNOSIS — E1122 Type 2 diabetes mellitus with diabetic chronic kidney disease: Secondary | ICD-10-CM | POA: Diagnosis not present

## 2020-08-10 DIAGNOSIS — N183 Chronic kidney disease, stage 3 unspecified: Secondary | ICD-10-CM | POA: Diagnosis not present

## 2020-08-10 DIAGNOSIS — E8881 Metabolic syndrome: Secondary | ICD-10-CM | POA: Diagnosis not present

## 2020-08-10 DIAGNOSIS — E11621 Type 2 diabetes mellitus with foot ulcer: Secondary | ICD-10-CM | POA: Diagnosis not present

## 2020-08-10 DIAGNOSIS — E782 Mixed hyperlipidemia: Secondary | ICD-10-CM | POA: Diagnosis not present

## 2020-08-10 DIAGNOSIS — E039 Hypothyroidism, unspecified: Secondary | ICD-10-CM | POA: Diagnosis not present

## 2020-08-10 DIAGNOSIS — R4582 Worries: Secondary | ICD-10-CM | POA: Diagnosis not present

## 2020-08-10 DIAGNOSIS — I10 Essential (primary) hypertension: Secondary | ICD-10-CM | POA: Diagnosis not present

## 2020-08-10 DIAGNOSIS — K7581 Nonalcoholic steatohepatitis (NASH): Secondary | ICD-10-CM | POA: Diagnosis not present

## 2020-08-10 DIAGNOSIS — G6289 Other specified polyneuropathies: Secondary | ICD-10-CM | POA: Diagnosis not present

## 2020-08-10 DIAGNOSIS — E1140 Type 2 diabetes mellitus with diabetic neuropathy, unspecified: Secondary | ICD-10-CM | POA: Diagnosis not present

## 2020-08-28 DIAGNOSIS — Z23 Encounter for immunization: Secondary | ICD-10-CM | POA: Diagnosis not present

## 2020-09-05 DIAGNOSIS — E1051 Type 1 diabetes mellitus with diabetic peripheral angiopathy without gangrene: Secondary | ICD-10-CM | POA: Diagnosis not present

## 2020-09-05 DIAGNOSIS — L84 Corns and callosities: Secondary | ICD-10-CM | POA: Diagnosis not present

## 2020-09-06 DIAGNOSIS — E7849 Other hyperlipidemia: Secondary | ICD-10-CM | POA: Diagnosis not present

## 2020-09-06 DIAGNOSIS — I129 Hypertensive chronic kidney disease with stage 1 through stage 4 chronic kidney disease, or unspecified chronic kidney disease: Secondary | ICD-10-CM | POA: Diagnosis not present

## 2020-09-06 DIAGNOSIS — E1122 Type 2 diabetes mellitus with diabetic chronic kidney disease: Secondary | ICD-10-CM | POA: Diagnosis not present

## 2020-09-06 DIAGNOSIS — N183 Chronic kidney disease, stage 3 unspecified: Secondary | ICD-10-CM | POA: Diagnosis not present

## 2020-09-19 DIAGNOSIS — M170 Bilateral primary osteoarthritis of knee: Secondary | ICD-10-CM | POA: Diagnosis not present

## 2020-10-06 DIAGNOSIS — E1122 Type 2 diabetes mellitus with diabetic chronic kidney disease: Secondary | ICD-10-CM | POA: Diagnosis not present

## 2020-10-06 DIAGNOSIS — N183 Chronic kidney disease, stage 3 unspecified: Secondary | ICD-10-CM | POA: Diagnosis not present

## 2020-10-06 DIAGNOSIS — E7849 Other hyperlipidemia: Secondary | ICD-10-CM | POA: Diagnosis not present

## 2020-10-06 DIAGNOSIS — I129 Hypertensive chronic kidney disease with stage 1 through stage 4 chronic kidney disease, or unspecified chronic kidney disease: Secondary | ICD-10-CM | POA: Diagnosis not present

## 2020-10-19 DIAGNOSIS — Z23 Encounter for immunization: Secondary | ICD-10-CM | POA: Diagnosis not present

## 2020-10-29 DIAGNOSIS — W19XXXA Unspecified fall, initial encounter: Secondary | ICD-10-CM | POA: Diagnosis not present

## 2020-10-29 DIAGNOSIS — M25562 Pain in left knee: Secondary | ICD-10-CM | POA: Diagnosis not present

## 2020-11-06 DIAGNOSIS — E7849 Other hyperlipidemia: Secondary | ICD-10-CM | POA: Diagnosis not present

## 2020-11-06 DIAGNOSIS — I129 Hypertensive chronic kidney disease with stage 1 through stage 4 chronic kidney disease, or unspecified chronic kidney disease: Secondary | ICD-10-CM | POA: Diagnosis not present

## 2020-11-06 DIAGNOSIS — E1122 Type 2 diabetes mellitus with diabetic chronic kidney disease: Secondary | ICD-10-CM | POA: Diagnosis not present

## 2020-11-06 DIAGNOSIS — N183 Chronic kidney disease, stage 3 unspecified: Secondary | ICD-10-CM | POA: Diagnosis not present

## 2020-11-12 DIAGNOSIS — M1712 Unilateral primary osteoarthritis, left knee: Secondary | ICD-10-CM | POA: Diagnosis not present

## 2020-11-12 DIAGNOSIS — M7989 Other specified soft tissue disorders: Secondary | ICD-10-CM | POA: Diagnosis not present

## 2020-11-13 DIAGNOSIS — R296 Repeated falls: Secondary | ICD-10-CM | POA: Diagnosis not present

## 2020-11-13 DIAGNOSIS — M1712 Unilateral primary osteoarthritis, left knee: Secondary | ICD-10-CM | POA: Diagnosis not present

## 2020-11-13 DIAGNOSIS — Z6838 Body mass index (BMI) 38.0-38.9, adult: Secondary | ICD-10-CM | POA: Diagnosis not present

## 2020-11-13 DIAGNOSIS — L03116 Cellulitis of left lower limb: Secondary | ICD-10-CM | POA: Diagnosis not present

## 2020-11-20 DIAGNOSIS — L84 Corns and callosities: Secondary | ICD-10-CM | POA: Diagnosis not present

## 2020-11-20 DIAGNOSIS — E1051 Type 1 diabetes mellitus with diabetic peripheral angiopathy without gangrene: Secondary | ICD-10-CM | POA: Diagnosis not present

## 2020-11-22 DIAGNOSIS — Z794 Long term (current) use of insulin: Secondary | ICD-10-CM | POA: Diagnosis not present

## 2020-11-22 DIAGNOSIS — F321 Major depressive disorder, single episode, moderate: Secondary | ICD-10-CM | POA: Insufficient documentation

## 2020-11-22 DIAGNOSIS — E279 Disorder of adrenal gland, unspecified: Secondary | ICD-10-CM | POA: Diagnosis not present

## 2020-11-22 DIAGNOSIS — B349 Viral infection, unspecified: Secondary | ICD-10-CM | POA: Diagnosis not present

## 2020-11-22 DIAGNOSIS — N2889 Other specified disorders of kidney and ureter: Secondary | ICD-10-CM | POA: Insufficient documentation

## 2020-11-22 DIAGNOSIS — K760 Fatty (change of) liver, not elsewhere classified: Secondary | ICD-10-CM | POA: Diagnosis not present

## 2020-11-22 DIAGNOSIS — J90 Pleural effusion, not elsewhere classified: Secondary | ICD-10-CM | POA: Diagnosis not present

## 2020-11-22 DIAGNOSIS — Z6838 Body mass index (BMI) 38.0-38.9, adult: Secondary | ICD-10-CM | POA: Diagnosis not present

## 2020-11-22 DIAGNOSIS — R1110 Vomiting, unspecified: Secondary | ICD-10-CM | POA: Diagnosis not present

## 2020-11-22 DIAGNOSIS — N289 Disorder of kidney and ureter, unspecified: Secondary | ICD-10-CM | POA: Diagnosis not present

## 2020-11-22 DIAGNOSIS — R0689 Other abnormalities of breathing: Secondary | ICD-10-CM | POA: Diagnosis not present

## 2020-11-22 DIAGNOSIS — R110 Nausea: Secondary | ICD-10-CM | POA: Diagnosis not present

## 2020-11-22 DIAGNOSIS — K219 Gastro-esophageal reflux disease without esophagitis: Secondary | ICD-10-CM | POA: Diagnosis not present

## 2020-11-22 DIAGNOSIS — E872 Acidosis: Secondary | ICD-10-CM | POA: Diagnosis not present

## 2020-11-22 DIAGNOSIS — R000 Tachycardia, unspecified: Secondary | ICD-10-CM | POA: Diagnosis not present

## 2020-11-22 DIAGNOSIS — R0602 Shortness of breath: Secondary | ICD-10-CM | POA: Diagnosis not present

## 2020-11-22 DIAGNOSIS — E782 Mixed hyperlipidemia: Secondary | ICD-10-CM | POA: Diagnosis not present

## 2020-11-22 DIAGNOSIS — Z593 Problems related to living in residential institution: Secondary | ICD-10-CM | POA: Diagnosis not present

## 2020-11-22 DIAGNOSIS — E039 Hypothyroidism, unspecified: Secondary | ICD-10-CM | POA: Diagnosis not present

## 2020-11-22 DIAGNOSIS — R0902 Hypoxemia: Secondary | ICD-10-CM | POA: Diagnosis not present

## 2020-11-22 DIAGNOSIS — Z882 Allergy status to sulfonamides status: Secondary | ICD-10-CM | POA: Diagnosis not present

## 2020-11-22 DIAGNOSIS — Z79899 Other long term (current) drug therapy: Secondary | ICD-10-CM | POA: Diagnosis not present

## 2020-11-22 DIAGNOSIS — I700 Atherosclerosis of aorta: Secondary | ICD-10-CM | POA: Diagnosis not present

## 2020-11-22 DIAGNOSIS — Z20822 Contact with and (suspected) exposure to covid-19: Secondary | ICD-10-CM | POA: Diagnosis not present

## 2020-11-22 DIAGNOSIS — R112 Nausea with vomiting, unspecified: Secondary | ICD-10-CM | POA: Diagnosis not present

## 2020-11-22 DIAGNOSIS — E1142 Type 2 diabetes mellitus with diabetic polyneuropathy: Secondary | ICD-10-CM | POA: Diagnosis not present

## 2020-11-22 DIAGNOSIS — Z7901 Long term (current) use of anticoagulants: Secondary | ICD-10-CM | POA: Diagnosis not present

## 2020-11-22 DIAGNOSIS — R161 Splenomegaly, not elsewhere classified: Secondary | ICD-10-CM | POA: Diagnosis not present

## 2020-11-22 DIAGNOSIS — R7402 Elevation of levels of lactic acid dehydrogenase (LDH): Secondary | ICD-10-CM | POA: Diagnosis not present

## 2020-11-22 DIAGNOSIS — R109 Unspecified abdominal pain: Secondary | ICD-10-CM | POA: Diagnosis not present

## 2020-11-22 DIAGNOSIS — Z87891 Personal history of nicotine dependence: Secondary | ICD-10-CM | POA: Diagnosis not present

## 2020-11-22 DIAGNOSIS — I10 Essential (primary) hypertension: Secondary | ICD-10-CM | POA: Diagnosis not present

## 2020-11-22 DIAGNOSIS — B340 Adenovirus infection, unspecified: Secondary | ICD-10-CM | POA: Diagnosis not present

## 2020-11-22 DIAGNOSIS — E278 Other specified disorders of adrenal gland: Secondary | ICD-10-CM | POA: Insufficient documentation

## 2020-11-23 DIAGNOSIS — N281 Cyst of kidney, acquired: Secondary | ICD-10-CM | POA: Diagnosis not present

## 2020-11-23 DIAGNOSIS — R161 Splenomegaly, not elsewhere classified: Secondary | ICD-10-CM | POA: Diagnosis not present

## 2020-11-23 DIAGNOSIS — K6389 Other specified diseases of intestine: Secondary | ICD-10-CM | POA: Diagnosis not present

## 2020-11-23 DIAGNOSIS — E278 Other specified disorders of adrenal gland: Secondary | ICD-10-CM | POA: Diagnosis not present

## 2020-11-24 DIAGNOSIS — R0902 Hypoxemia: Secondary | ICD-10-CM | POA: Diagnosis not present

## 2020-12-24 DIAGNOSIS — D3502 Benign neoplasm of left adrenal gland: Secondary | ICD-10-CM | POA: Diagnosis not present

## 2020-12-24 DIAGNOSIS — B349 Viral infection, unspecified: Secondary | ICD-10-CM | POA: Diagnosis not present

## 2020-12-24 DIAGNOSIS — I700 Atherosclerosis of aorta: Secondary | ICD-10-CM | POA: Diagnosis not present

## 2021-02-05 DIAGNOSIS — L84 Corns and callosities: Secondary | ICD-10-CM | POA: Diagnosis not present

## 2021-02-05 DIAGNOSIS — E1051 Type 1 diabetes mellitus with diabetic peripheral angiopathy without gangrene: Secondary | ICD-10-CM | POA: Diagnosis not present

## 2021-02-20 DIAGNOSIS — E11621 Type 2 diabetes mellitus with foot ulcer: Secondary | ICD-10-CM | POA: Diagnosis not present

## 2021-02-20 DIAGNOSIS — K219 Gastro-esophageal reflux disease without esophagitis: Secondary | ICD-10-CM | POA: Diagnosis not present

## 2021-02-20 DIAGNOSIS — E7849 Other hyperlipidemia: Secondary | ICD-10-CM | POA: Diagnosis not present

## 2021-02-20 DIAGNOSIS — E1142 Type 2 diabetes mellitus with diabetic polyneuropathy: Secondary | ICD-10-CM | POA: Diagnosis not present

## 2021-02-20 DIAGNOSIS — K7581 Nonalcoholic steatohepatitis (NASH): Secondary | ICD-10-CM | POA: Diagnosis not present

## 2021-02-20 DIAGNOSIS — E039 Hypothyroidism, unspecified: Secondary | ICD-10-CM | POA: Diagnosis not present

## 2021-02-20 DIAGNOSIS — E8881 Metabolic syndrome: Secondary | ICD-10-CM | POA: Diagnosis not present

## 2021-02-20 DIAGNOSIS — E1122 Type 2 diabetes mellitus with diabetic chronic kidney disease: Secondary | ICD-10-CM | POA: Diagnosis not present

## 2021-02-20 DIAGNOSIS — N183 Chronic kidney disease, stage 3 unspecified: Secondary | ICD-10-CM | POA: Diagnosis not present

## 2021-02-20 DIAGNOSIS — E782 Mixed hyperlipidemia: Secondary | ICD-10-CM | POA: Diagnosis not present

## 2021-02-27 DIAGNOSIS — Z1389 Encounter for screening for other disorder: Secondary | ICD-10-CM | POA: Diagnosis not present

## 2021-02-27 DIAGNOSIS — E11621 Type 2 diabetes mellitus with foot ulcer: Secondary | ICD-10-CM | POA: Diagnosis not present

## 2021-02-27 DIAGNOSIS — E1122 Type 2 diabetes mellitus with diabetic chronic kidney disease: Secondary | ICD-10-CM | POA: Diagnosis not present

## 2021-02-27 DIAGNOSIS — Z0001 Encounter for general adult medical examination with abnormal findings: Secondary | ICD-10-CM | POA: Diagnosis not present

## 2021-02-27 DIAGNOSIS — R4582 Worries: Secondary | ICD-10-CM | POA: Diagnosis not present

## 2021-02-27 DIAGNOSIS — Z1331 Encounter for screening for depression: Secondary | ICD-10-CM | POA: Diagnosis not present

## 2021-02-27 DIAGNOSIS — E7849 Other hyperlipidemia: Secondary | ICD-10-CM | POA: Diagnosis not present

## 2021-02-27 DIAGNOSIS — I10 Essential (primary) hypertension: Secondary | ICD-10-CM | POA: Diagnosis not present

## 2021-03-15 DIAGNOSIS — C44301 Unspecified malignant neoplasm of skin of nose: Secondary | ICD-10-CM | POA: Diagnosis not present

## 2021-03-15 DIAGNOSIS — L989 Disorder of the skin and subcutaneous tissue, unspecified: Secondary | ICD-10-CM | POA: Diagnosis not present

## 2021-03-15 DIAGNOSIS — D0439 Carcinoma in situ of skin of other parts of face: Secondary | ICD-10-CM | POA: Diagnosis not present

## 2021-04-08 DIAGNOSIS — M170 Bilateral primary osteoarthritis of knee: Secondary | ICD-10-CM | POA: Diagnosis not present

## 2021-04-30 DIAGNOSIS — L84 Corns and callosities: Secondary | ICD-10-CM | POA: Diagnosis not present

## 2021-04-30 DIAGNOSIS — E1051 Type 1 diabetes mellitus with diabetic peripheral angiopathy without gangrene: Secondary | ICD-10-CM | POA: Diagnosis not present

## 2021-05-06 DIAGNOSIS — I129 Hypertensive chronic kidney disease with stage 1 through stage 4 chronic kidney disease, or unspecified chronic kidney disease: Secondary | ICD-10-CM | POA: Diagnosis not present

## 2021-05-06 DIAGNOSIS — E1122 Type 2 diabetes mellitus with diabetic chronic kidney disease: Secondary | ICD-10-CM | POA: Diagnosis not present

## 2021-05-06 DIAGNOSIS — E7849 Other hyperlipidemia: Secondary | ICD-10-CM | POA: Diagnosis not present

## 2021-05-06 DIAGNOSIS — N183 Chronic kidney disease, stage 3 unspecified: Secondary | ICD-10-CM | POA: Diagnosis not present

## 2021-06-09 DIAGNOSIS — Z20822 Contact with and (suspected) exposure to covid-19: Secondary | ICD-10-CM | POA: Diagnosis not present

## 2021-06-11 DIAGNOSIS — Z20822 Contact with and (suspected) exposure to covid-19: Secondary | ICD-10-CM | POA: Diagnosis not present

## 2021-06-20 DIAGNOSIS — G6289 Other specified polyneuropathies: Secondary | ICD-10-CM | POA: Diagnosis not present

## 2021-06-20 DIAGNOSIS — E782 Mixed hyperlipidemia: Secondary | ICD-10-CM | POA: Diagnosis not present

## 2021-06-20 DIAGNOSIS — K7581 Nonalcoholic steatohepatitis (NASH): Secondary | ICD-10-CM | POA: Diagnosis not present

## 2021-06-20 DIAGNOSIS — Z23 Encounter for immunization: Secondary | ICD-10-CM | POA: Diagnosis not present

## 2021-06-20 DIAGNOSIS — E11621 Type 2 diabetes mellitus with foot ulcer: Secondary | ICD-10-CM | POA: Diagnosis not present

## 2021-06-20 DIAGNOSIS — E7849 Other hyperlipidemia: Secondary | ICD-10-CM | POA: Diagnosis not present

## 2021-06-20 DIAGNOSIS — I10 Essential (primary) hypertension: Secondary | ICD-10-CM | POA: Diagnosis not present

## 2021-06-20 DIAGNOSIS — E039 Hypothyroidism, unspecified: Secondary | ICD-10-CM | POA: Diagnosis not present

## 2021-06-20 DIAGNOSIS — E8881 Metabolic syndrome: Secondary | ICD-10-CM | POA: Diagnosis not present

## 2021-06-20 DIAGNOSIS — R4582 Worries: Secondary | ICD-10-CM | POA: Diagnosis not present

## 2021-06-20 DIAGNOSIS — Z1212 Encounter for screening for malignant neoplasm of rectum: Secondary | ICD-10-CM | POA: Diagnosis not present

## 2021-06-20 DIAGNOSIS — E1140 Type 2 diabetes mellitus with diabetic neuropathy, unspecified: Secondary | ICD-10-CM | POA: Diagnosis not present

## 2021-06-20 DIAGNOSIS — M810 Age-related osteoporosis without current pathological fracture: Secondary | ICD-10-CM | POA: Diagnosis not present

## 2021-06-20 DIAGNOSIS — E1122 Type 2 diabetes mellitus with diabetic chronic kidney disease: Secondary | ICD-10-CM | POA: Diagnosis not present

## 2021-06-20 DIAGNOSIS — R296 Repeated falls: Secondary | ICD-10-CM | POA: Diagnosis not present

## 2021-06-26 DIAGNOSIS — Z20822 Contact with and (suspected) exposure to covid-19: Secondary | ICD-10-CM | POA: Diagnosis not present

## 2021-07-07 DIAGNOSIS — I129 Hypertensive chronic kidney disease with stage 1 through stage 4 chronic kidney disease, or unspecified chronic kidney disease: Secondary | ICD-10-CM | POA: Diagnosis not present

## 2021-07-07 DIAGNOSIS — E7849 Other hyperlipidemia: Secondary | ICD-10-CM | POA: Diagnosis not present

## 2021-07-07 DIAGNOSIS — E1122 Type 2 diabetes mellitus with diabetic chronic kidney disease: Secondary | ICD-10-CM | POA: Diagnosis not present

## 2021-07-07 DIAGNOSIS — N183 Chronic kidney disease, stage 3 unspecified: Secondary | ICD-10-CM | POA: Diagnosis not present

## 2021-07-08 DIAGNOSIS — M170 Bilateral primary osteoarthritis of knee: Secondary | ICD-10-CM | POA: Diagnosis not present

## 2021-07-12 DIAGNOSIS — R300 Dysuria: Secondary | ICD-10-CM | POA: Diagnosis not present

## 2021-07-23 DIAGNOSIS — E1051 Type 1 diabetes mellitus with diabetic peripheral angiopathy without gangrene: Secondary | ICD-10-CM | POA: Diagnosis not present

## 2021-07-23 DIAGNOSIS — L84 Corns and callosities: Secondary | ICD-10-CM | POA: Diagnosis not present

## 2021-07-30 DIAGNOSIS — M5136 Other intervertebral disc degeneration, lumbar region: Secondary | ICD-10-CM | POA: Diagnosis not present

## 2021-07-30 DIAGNOSIS — E1122 Type 2 diabetes mellitus with diabetic chronic kidney disease: Secondary | ICD-10-CM | POA: Diagnosis not present

## 2021-07-30 DIAGNOSIS — F331 Major depressive disorder, recurrent, moderate: Secondary | ICD-10-CM | POA: Diagnosis not present

## 2021-07-30 DIAGNOSIS — E1142 Type 2 diabetes mellitus with diabetic polyneuropathy: Secondary | ICD-10-CM | POA: Diagnosis not present

## 2021-07-30 DIAGNOSIS — Z6838 Body mass index (BMI) 38.0-38.9, adult: Secondary | ICD-10-CM | POA: Diagnosis not present

## 2021-07-30 DIAGNOSIS — E669 Obesity, unspecified: Secondary | ICD-10-CM | POA: Diagnosis not present

## 2021-07-30 DIAGNOSIS — Z794 Long term (current) use of insulin: Secondary | ICD-10-CM | POA: Diagnosis not present

## 2021-07-30 DIAGNOSIS — Z9181 History of falling: Secondary | ICD-10-CM | POA: Diagnosis not present

## 2021-07-30 DIAGNOSIS — R296 Repeated falls: Secondary | ICD-10-CM | POA: Diagnosis not present

## 2021-07-30 DIAGNOSIS — I10 Essential (primary) hypertension: Secondary | ICD-10-CM | POA: Diagnosis not present

## 2021-07-30 DIAGNOSIS — M810 Age-related osteoporosis without current pathological fracture: Secondary | ICD-10-CM | POA: Diagnosis not present

## 2021-07-30 DIAGNOSIS — N189 Chronic kidney disease, unspecified: Secondary | ICD-10-CM | POA: Diagnosis not present

## 2021-07-30 DIAGNOSIS — M19072 Primary osteoarthritis, left ankle and foot: Secondary | ICD-10-CM | POA: Diagnosis not present

## 2021-08-01 DIAGNOSIS — M19072 Primary osteoarthritis, left ankle and foot: Secondary | ICD-10-CM | POA: Diagnosis not present

## 2021-08-01 DIAGNOSIS — E1122 Type 2 diabetes mellitus with diabetic chronic kidney disease: Secondary | ICD-10-CM | POA: Diagnosis not present

## 2021-08-01 DIAGNOSIS — E1142 Type 2 diabetes mellitus with diabetic polyneuropathy: Secondary | ICD-10-CM | POA: Diagnosis not present

## 2021-08-01 DIAGNOSIS — F331 Major depressive disorder, recurrent, moderate: Secondary | ICD-10-CM | POA: Diagnosis not present

## 2021-08-01 DIAGNOSIS — I10 Essential (primary) hypertension: Secondary | ICD-10-CM | POA: Diagnosis not present

## 2021-08-01 DIAGNOSIS — M5136 Other intervertebral disc degeneration, lumbar region: Secondary | ICD-10-CM | POA: Diagnosis not present

## 2021-08-05 DIAGNOSIS — I10 Essential (primary) hypertension: Secondary | ICD-10-CM | POA: Diagnosis not present

## 2021-08-05 DIAGNOSIS — E1142 Type 2 diabetes mellitus with diabetic polyneuropathy: Secondary | ICD-10-CM | POA: Diagnosis not present

## 2021-08-05 DIAGNOSIS — F331 Major depressive disorder, recurrent, moderate: Secondary | ICD-10-CM | POA: Diagnosis not present

## 2021-08-05 DIAGNOSIS — E1122 Type 2 diabetes mellitus with diabetic chronic kidney disease: Secondary | ICD-10-CM | POA: Diagnosis not present

## 2021-08-05 DIAGNOSIS — M19072 Primary osteoarthritis, left ankle and foot: Secondary | ICD-10-CM | POA: Diagnosis not present

## 2021-08-05 DIAGNOSIS — M5136 Other intervertebral disc degeneration, lumbar region: Secondary | ICD-10-CM | POA: Diagnosis not present

## 2021-08-07 DIAGNOSIS — E7849 Other hyperlipidemia: Secondary | ICD-10-CM | POA: Diagnosis not present

## 2021-08-07 DIAGNOSIS — E1122 Type 2 diabetes mellitus with diabetic chronic kidney disease: Secondary | ICD-10-CM | POA: Diagnosis not present

## 2021-08-07 DIAGNOSIS — N183 Chronic kidney disease, stage 3 unspecified: Secondary | ICD-10-CM | POA: Diagnosis not present

## 2021-08-07 DIAGNOSIS — I129 Hypertensive chronic kidney disease with stage 1 through stage 4 chronic kidney disease, or unspecified chronic kidney disease: Secondary | ICD-10-CM | POA: Diagnosis not present

## 2021-08-08 DIAGNOSIS — G622 Polyneuropathy due to other toxic agents: Secondary | ICD-10-CM | POA: Diagnosis not present

## 2021-08-08 DIAGNOSIS — I10 Essential (primary) hypertension: Secondary | ICD-10-CM | POA: Diagnosis not present

## 2021-08-08 DIAGNOSIS — F331 Major depressive disorder, recurrent, moderate: Secondary | ICD-10-CM | POA: Diagnosis not present

## 2021-08-08 DIAGNOSIS — E1142 Type 2 diabetes mellitus with diabetic polyneuropathy: Secondary | ICD-10-CM | POA: Diagnosis not present

## 2021-08-08 DIAGNOSIS — M19072 Primary osteoarthritis, left ankle and foot: Secondary | ICD-10-CM | POA: Diagnosis not present

## 2021-08-08 DIAGNOSIS — E1122 Type 2 diabetes mellitus with diabetic chronic kidney disease: Secondary | ICD-10-CM | POA: Diagnosis not present

## 2021-08-08 DIAGNOSIS — M5136 Other intervertebral disc degeneration, lumbar region: Secondary | ICD-10-CM | POA: Diagnosis not present

## 2021-08-08 DIAGNOSIS — Z23 Encounter for immunization: Secondary | ICD-10-CM | POA: Diagnosis not present

## 2021-08-08 DIAGNOSIS — Z6838 Body mass index (BMI) 38.0-38.9, adult: Secondary | ICD-10-CM | POA: Diagnosis not present

## 2021-08-14 DIAGNOSIS — F331 Major depressive disorder, recurrent, moderate: Secondary | ICD-10-CM | POA: Diagnosis not present

## 2021-08-14 DIAGNOSIS — M5136 Other intervertebral disc degeneration, lumbar region: Secondary | ICD-10-CM | POA: Diagnosis not present

## 2021-08-14 DIAGNOSIS — E1142 Type 2 diabetes mellitus with diabetic polyneuropathy: Secondary | ICD-10-CM | POA: Diagnosis not present

## 2021-08-14 DIAGNOSIS — I10 Essential (primary) hypertension: Secondary | ICD-10-CM | POA: Diagnosis not present

## 2021-08-14 DIAGNOSIS — E1122 Type 2 diabetes mellitus with diabetic chronic kidney disease: Secondary | ICD-10-CM | POA: Diagnosis not present

## 2021-08-14 DIAGNOSIS — M19072 Primary osteoarthritis, left ankle and foot: Secondary | ICD-10-CM | POA: Diagnosis not present

## 2021-08-17 DIAGNOSIS — M19072 Primary osteoarthritis, left ankle and foot: Secondary | ICD-10-CM | POA: Diagnosis not present

## 2021-08-17 DIAGNOSIS — E1142 Type 2 diabetes mellitus with diabetic polyneuropathy: Secondary | ICD-10-CM | POA: Diagnosis not present

## 2021-08-17 DIAGNOSIS — I10 Essential (primary) hypertension: Secondary | ICD-10-CM | POA: Diagnosis not present

## 2021-08-17 DIAGNOSIS — E1122 Type 2 diabetes mellitus with diabetic chronic kidney disease: Secondary | ICD-10-CM | POA: Diagnosis not present

## 2021-08-17 DIAGNOSIS — M5136 Other intervertebral disc degeneration, lumbar region: Secondary | ICD-10-CM | POA: Diagnosis not present

## 2021-08-17 DIAGNOSIS — F331 Major depressive disorder, recurrent, moderate: Secondary | ICD-10-CM | POA: Diagnosis not present

## 2021-08-20 DIAGNOSIS — M5136 Other intervertebral disc degeneration, lumbar region: Secondary | ICD-10-CM | POA: Diagnosis not present

## 2021-08-20 DIAGNOSIS — E1142 Type 2 diabetes mellitus with diabetic polyneuropathy: Secondary | ICD-10-CM | POA: Diagnosis not present

## 2021-08-20 DIAGNOSIS — L03116 Cellulitis of left lower limb: Secondary | ICD-10-CM | POA: Diagnosis not present

## 2021-08-20 DIAGNOSIS — I10 Essential (primary) hypertension: Secondary | ICD-10-CM | POA: Diagnosis not present

## 2021-08-20 DIAGNOSIS — F331 Major depressive disorder, recurrent, moderate: Secondary | ICD-10-CM | POA: Diagnosis not present

## 2021-08-20 DIAGNOSIS — M19072 Primary osteoarthritis, left ankle and foot: Secondary | ICD-10-CM | POA: Diagnosis not present

## 2021-08-20 DIAGNOSIS — E1122 Type 2 diabetes mellitus with diabetic chronic kidney disease: Secondary | ICD-10-CM | POA: Diagnosis not present

## 2021-08-26 DIAGNOSIS — M19072 Primary osteoarthritis, left ankle and foot: Secondary | ICD-10-CM | POA: Diagnosis not present

## 2021-08-26 DIAGNOSIS — E1122 Type 2 diabetes mellitus with diabetic chronic kidney disease: Secondary | ICD-10-CM | POA: Diagnosis not present

## 2021-08-26 DIAGNOSIS — M810 Age-related osteoporosis without current pathological fracture: Secondary | ICD-10-CM | POA: Diagnosis not present

## 2021-08-26 DIAGNOSIS — N189 Chronic kidney disease, unspecified: Secondary | ICD-10-CM | POA: Diagnosis not present

## 2021-08-26 DIAGNOSIS — R296 Repeated falls: Secondary | ICD-10-CM | POA: Diagnosis not present

## 2021-08-26 DIAGNOSIS — M5136 Other intervertebral disc degeneration, lumbar region: Secondary | ICD-10-CM | POA: Diagnosis not present

## 2021-08-26 DIAGNOSIS — E1142 Type 2 diabetes mellitus with diabetic polyneuropathy: Secondary | ICD-10-CM | POA: Diagnosis not present

## 2021-08-26 DIAGNOSIS — I10 Essential (primary) hypertension: Secondary | ICD-10-CM | POA: Diagnosis not present

## 2021-08-27 DIAGNOSIS — E1142 Type 2 diabetes mellitus with diabetic polyneuropathy: Secondary | ICD-10-CM | POA: Diagnosis not present

## 2021-08-27 DIAGNOSIS — F331 Major depressive disorder, recurrent, moderate: Secondary | ICD-10-CM | POA: Diagnosis not present

## 2021-08-27 DIAGNOSIS — M5136 Other intervertebral disc degeneration, lumbar region: Secondary | ICD-10-CM | POA: Diagnosis not present

## 2021-08-27 DIAGNOSIS — E1122 Type 2 diabetes mellitus with diabetic chronic kidney disease: Secondary | ICD-10-CM | POA: Diagnosis not present

## 2021-08-27 DIAGNOSIS — I10 Essential (primary) hypertension: Secondary | ICD-10-CM | POA: Diagnosis not present

## 2021-08-27 DIAGNOSIS — M19072 Primary osteoarthritis, left ankle and foot: Secondary | ICD-10-CM | POA: Diagnosis not present

## 2021-08-29 DIAGNOSIS — E1122 Type 2 diabetes mellitus with diabetic chronic kidney disease: Secondary | ICD-10-CM | POA: Diagnosis not present

## 2021-08-29 DIAGNOSIS — E1142 Type 2 diabetes mellitus with diabetic polyneuropathy: Secondary | ICD-10-CM | POA: Diagnosis not present

## 2021-08-29 DIAGNOSIS — N189 Chronic kidney disease, unspecified: Secondary | ICD-10-CM | POA: Diagnosis not present

## 2021-08-29 DIAGNOSIS — I10 Essential (primary) hypertension: Secondary | ICD-10-CM | POA: Diagnosis not present

## 2021-08-29 DIAGNOSIS — Z6838 Body mass index (BMI) 38.0-38.9, adult: Secondary | ICD-10-CM | POA: Diagnosis not present

## 2021-08-29 DIAGNOSIS — Z9181 History of falling: Secondary | ICD-10-CM | POA: Diagnosis not present

## 2021-08-29 DIAGNOSIS — Z794 Long term (current) use of insulin: Secondary | ICD-10-CM | POA: Diagnosis not present

## 2021-08-29 DIAGNOSIS — F331 Major depressive disorder, recurrent, moderate: Secondary | ICD-10-CM | POA: Diagnosis not present

## 2021-08-29 DIAGNOSIS — E669 Obesity, unspecified: Secondary | ICD-10-CM | POA: Diagnosis not present

## 2021-08-29 DIAGNOSIS — M810 Age-related osteoporosis without current pathological fracture: Secondary | ICD-10-CM | POA: Diagnosis not present

## 2021-08-29 DIAGNOSIS — M19072 Primary osteoarthritis, left ankle and foot: Secondary | ICD-10-CM | POA: Diagnosis not present

## 2021-08-29 DIAGNOSIS — R296 Repeated falls: Secondary | ICD-10-CM | POA: Diagnosis not present

## 2021-08-29 DIAGNOSIS — M5136 Other intervertebral disc degeneration, lumbar region: Secondary | ICD-10-CM | POA: Diagnosis not present

## 2021-09-03 DIAGNOSIS — M5136 Other intervertebral disc degeneration, lumbar region: Secondary | ICD-10-CM | POA: Diagnosis not present

## 2021-09-03 DIAGNOSIS — F331 Major depressive disorder, recurrent, moderate: Secondary | ICD-10-CM | POA: Diagnosis not present

## 2021-09-03 DIAGNOSIS — I10 Essential (primary) hypertension: Secondary | ICD-10-CM | POA: Diagnosis not present

## 2021-09-03 DIAGNOSIS — M19072 Primary osteoarthritis, left ankle and foot: Secondary | ICD-10-CM | POA: Diagnosis not present

## 2021-09-03 DIAGNOSIS — E1142 Type 2 diabetes mellitus with diabetic polyneuropathy: Secondary | ICD-10-CM | POA: Diagnosis not present

## 2021-09-03 DIAGNOSIS — E1122 Type 2 diabetes mellitus with diabetic chronic kidney disease: Secondary | ICD-10-CM | POA: Diagnosis not present

## 2021-09-06 DIAGNOSIS — I10 Essential (primary) hypertension: Secondary | ICD-10-CM | POA: Diagnosis not present

## 2021-09-06 DIAGNOSIS — E782 Mixed hyperlipidemia: Secondary | ICD-10-CM | POA: Diagnosis not present

## 2021-09-06 DIAGNOSIS — F321 Major depressive disorder, single episode, moderate: Secondary | ICD-10-CM | POA: Diagnosis not present

## 2021-09-06 DIAGNOSIS — Z79899 Other long term (current) drug therapy: Secondary | ICD-10-CM | POA: Diagnosis not present

## 2021-09-06 DIAGNOSIS — Z87891 Personal history of nicotine dependence: Secondary | ICD-10-CM | POA: Diagnosis not present

## 2021-09-06 DIAGNOSIS — M5030 Other cervical disc degeneration, unspecified cervical region: Secondary | ICD-10-CM | POA: Diagnosis not present

## 2021-09-06 DIAGNOSIS — F419 Anxiety disorder, unspecified: Secondary | ICD-10-CM | POA: Diagnosis not present

## 2021-09-06 DIAGNOSIS — E079 Disorder of thyroid, unspecified: Secondary | ICD-10-CM | POA: Diagnosis not present

## 2021-09-06 DIAGNOSIS — K219 Gastro-esophageal reflux disease without esophagitis: Secondary | ICD-10-CM | POA: Diagnosis not present

## 2021-09-06 DIAGNOSIS — R519 Headache, unspecified: Secondary | ICD-10-CM | POA: Diagnosis not present

## 2021-09-06 DIAGNOSIS — E1140 Type 2 diabetes mellitus with diabetic neuropathy, unspecified: Secondary | ICD-10-CM | POA: Diagnosis not present

## 2021-09-06 DIAGNOSIS — R9082 White matter disease, unspecified: Secondary | ICD-10-CM | POA: Diagnosis not present

## 2021-09-06 DIAGNOSIS — S0990XA Unspecified injury of head, initial encounter: Secondary | ICD-10-CM | POA: Diagnosis not present

## 2021-09-06 DIAGNOSIS — I700 Atherosclerosis of aorta: Secondary | ICD-10-CM | POA: Diagnosis not present

## 2021-09-06 DIAGNOSIS — W07XXXA Fall from chair, initial encounter: Secondary | ICD-10-CM | POA: Diagnosis not present

## 2021-09-06 DIAGNOSIS — W19XXXA Unspecified fall, initial encounter: Secondary | ICD-10-CM | POA: Diagnosis not present

## 2021-09-06 DIAGNOSIS — Z794 Long term (current) use of insulin: Secondary | ICD-10-CM | POA: Diagnosis not present

## 2021-09-06 DIAGNOSIS — Z7984 Long term (current) use of oral hypoglycemic drugs: Secondary | ICD-10-CM | POA: Diagnosis not present

## 2021-09-06 DIAGNOSIS — S0101XA Laceration without foreign body of scalp, initial encounter: Secondary | ICD-10-CM | POA: Diagnosis not present

## 2021-09-09 DIAGNOSIS — M19072 Primary osteoarthritis, left ankle and foot: Secondary | ICD-10-CM | POA: Diagnosis not present

## 2021-09-09 DIAGNOSIS — F331 Major depressive disorder, recurrent, moderate: Secondary | ICD-10-CM | POA: Diagnosis not present

## 2021-09-09 DIAGNOSIS — E1142 Type 2 diabetes mellitus with diabetic polyneuropathy: Secondary | ICD-10-CM | POA: Diagnosis not present

## 2021-09-09 DIAGNOSIS — I10 Essential (primary) hypertension: Secondary | ICD-10-CM | POA: Diagnosis not present

## 2021-09-09 DIAGNOSIS — E1122 Type 2 diabetes mellitus with diabetic chronic kidney disease: Secondary | ICD-10-CM | POA: Diagnosis not present

## 2021-09-09 DIAGNOSIS — M5136 Other intervertebral disc degeneration, lumbar region: Secondary | ICD-10-CM | POA: Diagnosis not present

## 2021-09-10 DIAGNOSIS — M25562 Pain in left knee: Secondary | ICD-10-CM | POA: Diagnosis not present

## 2021-09-11 DIAGNOSIS — F331 Major depressive disorder, recurrent, moderate: Secondary | ICD-10-CM | POA: Diagnosis not present

## 2021-09-11 DIAGNOSIS — M5136 Other intervertebral disc degeneration, lumbar region: Secondary | ICD-10-CM | POA: Diagnosis not present

## 2021-09-11 DIAGNOSIS — I10 Essential (primary) hypertension: Secondary | ICD-10-CM | POA: Diagnosis not present

## 2021-09-11 DIAGNOSIS — E1122 Type 2 diabetes mellitus with diabetic chronic kidney disease: Secondary | ICD-10-CM | POA: Diagnosis not present

## 2021-09-11 DIAGNOSIS — E1142 Type 2 diabetes mellitus with diabetic polyneuropathy: Secondary | ICD-10-CM | POA: Diagnosis not present

## 2021-09-11 DIAGNOSIS — M19072 Primary osteoarthritis, left ankle and foot: Secondary | ICD-10-CM | POA: Diagnosis not present

## 2021-09-16 DIAGNOSIS — M19072 Primary osteoarthritis, left ankle and foot: Secondary | ICD-10-CM | POA: Diagnosis not present

## 2021-09-16 DIAGNOSIS — S0101XD Laceration without foreign body of scalp, subsequent encounter: Secondary | ICD-10-CM | POA: Diagnosis not present

## 2021-09-16 DIAGNOSIS — M5136 Other intervertebral disc degeneration, lumbar region: Secondary | ICD-10-CM | POA: Diagnosis not present

## 2021-09-16 DIAGNOSIS — I10 Essential (primary) hypertension: Secondary | ICD-10-CM | POA: Diagnosis not present

## 2021-09-16 DIAGNOSIS — E1142 Type 2 diabetes mellitus with diabetic polyneuropathy: Secondary | ICD-10-CM | POA: Diagnosis not present

## 2021-09-16 DIAGNOSIS — X58XXXD Exposure to other specified factors, subsequent encounter: Secondary | ICD-10-CM | POA: Diagnosis not present

## 2021-09-16 DIAGNOSIS — F331 Major depressive disorder, recurrent, moderate: Secondary | ICD-10-CM | POA: Diagnosis not present

## 2021-09-16 DIAGNOSIS — E1122 Type 2 diabetes mellitus with diabetic chronic kidney disease: Secondary | ICD-10-CM | POA: Diagnosis not present

## 2021-09-18 DIAGNOSIS — E1122 Type 2 diabetes mellitus with diabetic chronic kidney disease: Secondary | ICD-10-CM | POA: Diagnosis not present

## 2021-09-18 DIAGNOSIS — E1142 Type 2 diabetes mellitus with diabetic polyneuropathy: Secondary | ICD-10-CM | POA: Diagnosis not present

## 2021-09-18 DIAGNOSIS — R300 Dysuria: Secondary | ICD-10-CM | POA: Diagnosis not present

## 2021-09-18 DIAGNOSIS — M5136 Other intervertebral disc degeneration, lumbar region: Secondary | ICD-10-CM | POA: Diagnosis not present

## 2021-09-18 DIAGNOSIS — I10 Essential (primary) hypertension: Secondary | ICD-10-CM | POA: Diagnosis not present

## 2021-09-18 DIAGNOSIS — M19072 Primary osteoarthritis, left ankle and foot: Secondary | ICD-10-CM | POA: Diagnosis not present

## 2021-09-18 DIAGNOSIS — F331 Major depressive disorder, recurrent, moderate: Secondary | ICD-10-CM | POA: Diagnosis not present

## 2021-09-20 DIAGNOSIS — W19XXXA Unspecified fall, initial encounter: Secondary | ICD-10-CM | POA: Diagnosis not present

## 2021-09-20 DIAGNOSIS — M79662 Pain in left lower leg: Secondary | ICD-10-CM | POA: Diagnosis not present

## 2021-09-23 DIAGNOSIS — M19072 Primary osteoarthritis, left ankle and foot: Secondary | ICD-10-CM | POA: Diagnosis not present

## 2021-09-23 DIAGNOSIS — E1122 Type 2 diabetes mellitus with diabetic chronic kidney disease: Secondary | ICD-10-CM | POA: Diagnosis not present

## 2021-09-23 DIAGNOSIS — M5136 Other intervertebral disc degeneration, lumbar region: Secondary | ICD-10-CM | POA: Diagnosis not present

## 2021-09-23 DIAGNOSIS — I10 Essential (primary) hypertension: Secondary | ICD-10-CM | POA: Diagnosis not present

## 2021-09-23 DIAGNOSIS — F331 Major depressive disorder, recurrent, moderate: Secondary | ICD-10-CM | POA: Diagnosis not present

## 2021-09-23 DIAGNOSIS — E1142 Type 2 diabetes mellitus with diabetic polyneuropathy: Secondary | ICD-10-CM | POA: Diagnosis not present

## 2021-09-24 DIAGNOSIS — Z20822 Contact with and (suspected) exposure to covid-19: Secondary | ICD-10-CM | POA: Diagnosis not present

## 2021-09-26 DIAGNOSIS — M19072 Primary osteoarthritis, left ankle and foot: Secondary | ICD-10-CM | POA: Diagnosis not present

## 2021-09-26 DIAGNOSIS — E1122 Type 2 diabetes mellitus with diabetic chronic kidney disease: Secondary | ICD-10-CM | POA: Diagnosis not present

## 2021-09-26 DIAGNOSIS — I10 Essential (primary) hypertension: Secondary | ICD-10-CM | POA: Diagnosis not present

## 2021-09-26 DIAGNOSIS — E1142 Type 2 diabetes mellitus with diabetic polyneuropathy: Secondary | ICD-10-CM | POA: Diagnosis not present

## 2021-09-26 DIAGNOSIS — F331 Major depressive disorder, recurrent, moderate: Secondary | ICD-10-CM | POA: Diagnosis not present

## 2021-09-26 DIAGNOSIS — R300 Dysuria: Secondary | ICD-10-CM | POA: Diagnosis not present

## 2021-09-26 DIAGNOSIS — M5136 Other intervertebral disc degeneration, lumbar region: Secondary | ICD-10-CM | POA: Diagnosis not present

## 2021-10-07 DIAGNOSIS — I129 Hypertensive chronic kidney disease with stage 1 through stage 4 chronic kidney disease, or unspecified chronic kidney disease: Secondary | ICD-10-CM | POA: Diagnosis not present

## 2021-10-07 DIAGNOSIS — E7849 Other hyperlipidemia: Secondary | ICD-10-CM | POA: Diagnosis not present

## 2021-10-07 DIAGNOSIS — E1122 Type 2 diabetes mellitus with diabetic chronic kidney disease: Secondary | ICD-10-CM | POA: Diagnosis not present

## 2021-10-07 DIAGNOSIS — N183 Chronic kidney disease, stage 3 unspecified: Secondary | ICD-10-CM | POA: Diagnosis not present

## 2021-10-09 DIAGNOSIS — M25562 Pain in left knee: Secondary | ICD-10-CM | POA: Diagnosis not present

## 2021-10-09 DIAGNOSIS — M1712 Unilateral primary osteoarthritis, left knee: Secondary | ICD-10-CM | POA: Diagnosis not present

## 2021-10-09 DIAGNOSIS — M1711 Unilateral primary osteoarthritis, right knee: Secondary | ICD-10-CM | POA: Diagnosis not present

## 2021-10-09 DIAGNOSIS — W19XXXA Unspecified fall, initial encounter: Secondary | ICD-10-CM | POA: Diagnosis not present

## 2021-10-09 DIAGNOSIS — M25561 Pain in right knee: Secondary | ICD-10-CM | POA: Diagnosis not present

## 2021-10-16 DIAGNOSIS — M25561 Pain in right knee: Secondary | ICD-10-CM | POA: Insufficient documentation

## 2021-10-17 DIAGNOSIS — M1712 Unilateral primary osteoarthritis, left knee: Secondary | ICD-10-CM | POA: Diagnosis not present

## 2021-10-21 DIAGNOSIS — G6289 Other specified polyneuropathies: Secondary | ICD-10-CM | POA: Diagnosis not present

## 2021-10-21 DIAGNOSIS — R296 Repeated falls: Secondary | ICD-10-CM | POA: Diagnosis not present

## 2021-10-21 DIAGNOSIS — E1140 Type 2 diabetes mellitus with diabetic neuropathy, unspecified: Secondary | ICD-10-CM | POA: Diagnosis not present

## 2021-10-21 DIAGNOSIS — E8881 Metabolic syndrome: Secondary | ICD-10-CM | POA: Diagnosis not present

## 2021-10-21 DIAGNOSIS — E039 Hypothyroidism, unspecified: Secondary | ICD-10-CM | POA: Diagnosis not present

## 2021-10-21 DIAGNOSIS — I10 Essential (primary) hypertension: Secondary | ICD-10-CM | POA: Diagnosis not present

## 2021-10-21 DIAGNOSIS — K7581 Nonalcoholic steatohepatitis (NASH): Secondary | ICD-10-CM | POA: Diagnosis not present

## 2021-10-21 DIAGNOSIS — Z1212 Encounter for screening for malignant neoplasm of rectum: Secondary | ICD-10-CM | POA: Diagnosis not present

## 2021-10-21 DIAGNOSIS — E11621 Type 2 diabetes mellitus with foot ulcer: Secondary | ICD-10-CM | POA: Diagnosis not present

## 2021-10-21 DIAGNOSIS — R4582 Worries: Secondary | ICD-10-CM | POA: Diagnosis not present

## 2021-10-21 DIAGNOSIS — E1122 Type 2 diabetes mellitus with diabetic chronic kidney disease: Secondary | ICD-10-CM | POA: Diagnosis not present

## 2021-10-21 DIAGNOSIS — M810 Age-related osteoporosis without current pathological fracture: Secondary | ICD-10-CM | POA: Diagnosis not present

## 2021-10-21 DIAGNOSIS — E782 Mixed hyperlipidemia: Secondary | ICD-10-CM | POA: Diagnosis not present

## 2021-10-28 DIAGNOSIS — K5900 Constipation, unspecified: Secondary | ICD-10-CM | POA: Diagnosis not present

## 2021-10-28 DIAGNOSIS — G4700 Insomnia, unspecified: Secondary | ICD-10-CM | POA: Diagnosis not present

## 2021-10-28 DIAGNOSIS — F32A Depression, unspecified: Secondary | ICD-10-CM | POA: Diagnosis not present

## 2021-10-28 DIAGNOSIS — M79661 Pain in right lower leg: Secondary | ICD-10-CM | POA: Diagnosis not present

## 2021-10-28 DIAGNOSIS — E782 Mixed hyperlipidemia: Secondary | ICD-10-CM | POA: Diagnosis not present

## 2021-10-28 DIAGNOSIS — M19072 Primary osteoarthritis, left ankle and foot: Secondary | ICD-10-CM | POA: Diagnosis not present

## 2021-10-28 DIAGNOSIS — M810 Age-related osteoporosis without current pathological fracture: Secondary | ICD-10-CM | POA: Diagnosis not present

## 2021-10-28 DIAGNOSIS — K219 Gastro-esophageal reflux disease without esophagitis: Secondary | ICD-10-CM | POA: Diagnosis not present

## 2021-10-28 DIAGNOSIS — K7581 Nonalcoholic steatohepatitis (NASH): Secondary | ICD-10-CM | POA: Diagnosis not present

## 2021-10-28 DIAGNOSIS — R202 Paresthesia of skin: Secondary | ICD-10-CM | POA: Diagnosis not present

## 2021-10-28 DIAGNOSIS — Z6836 Body mass index (BMI) 36.0-36.9, adult: Secondary | ICD-10-CM | POA: Diagnosis not present

## 2021-10-28 DIAGNOSIS — M79662 Pain in left lower leg: Secondary | ICD-10-CM | POA: Diagnosis not present

## 2021-10-28 DIAGNOSIS — D2239 Melanocytic nevi of other parts of face: Secondary | ICD-10-CM | POA: Diagnosis not present

## 2021-10-28 DIAGNOSIS — Z7985 Long-term (current) use of injectable non-insulin antidiabetic drugs: Secondary | ICD-10-CM | POA: Diagnosis not present

## 2021-10-28 DIAGNOSIS — E1142 Type 2 diabetes mellitus with diabetic polyneuropathy: Secondary | ICD-10-CM | POA: Diagnosis not present

## 2021-10-28 DIAGNOSIS — M5136 Other intervertebral disc degeneration, lumbar region: Secondary | ICD-10-CM | POA: Diagnosis not present

## 2021-10-28 DIAGNOSIS — E1122 Type 2 diabetes mellitus with diabetic chronic kidney disease: Secondary | ICD-10-CM | POA: Diagnosis not present

## 2021-10-28 DIAGNOSIS — G4733 Obstructive sleep apnea (adult) (pediatric): Secondary | ICD-10-CM | POA: Diagnosis not present

## 2021-10-28 DIAGNOSIS — E039 Hypothyroidism, unspecified: Secondary | ICD-10-CM | POA: Diagnosis not present

## 2021-10-28 DIAGNOSIS — Z794 Long term (current) use of insulin: Secondary | ICD-10-CM | POA: Diagnosis not present

## 2021-10-28 DIAGNOSIS — E8881 Metabolic syndrome: Secondary | ICD-10-CM | POA: Diagnosis not present

## 2021-10-28 DIAGNOSIS — I129 Hypertensive chronic kidney disease with stage 1 through stage 4 chronic kidney disease, or unspecified chronic kidney disease: Secondary | ICD-10-CM | POA: Diagnosis not present

## 2021-10-28 DIAGNOSIS — R296 Repeated falls: Secondary | ICD-10-CM | POA: Diagnosis not present

## 2021-10-28 DIAGNOSIS — N189 Chronic kidney disease, unspecified: Secondary | ICD-10-CM | POA: Diagnosis not present

## 2021-11-02 DIAGNOSIS — R4182 Altered mental status, unspecified: Secondary | ICD-10-CM | POA: Diagnosis not present

## 2021-11-02 DIAGNOSIS — Z794 Long term (current) use of insulin: Secondary | ICD-10-CM | POA: Diagnosis not present

## 2021-11-02 DIAGNOSIS — R0902 Hypoxemia: Secondary | ICD-10-CM | POA: Diagnosis not present

## 2021-11-02 DIAGNOSIS — Z981 Arthrodesis status: Secondary | ICD-10-CM | POA: Diagnosis not present

## 2021-11-02 DIAGNOSIS — R410 Disorientation, unspecified: Secondary | ICD-10-CM | POA: Diagnosis not present

## 2021-11-02 DIAGNOSIS — S065X0A Traumatic subdural hemorrhage without loss of consciousness, initial encounter: Secondary | ICD-10-CM | POA: Diagnosis not present

## 2021-11-02 DIAGNOSIS — E1142 Type 2 diabetes mellitus with diabetic polyneuropathy: Secondary | ICD-10-CM | POA: Diagnosis not present

## 2021-11-02 DIAGNOSIS — W19XXXA Unspecified fall, initial encounter: Secondary | ICD-10-CM | POA: Diagnosis not present

## 2021-11-02 DIAGNOSIS — Z79899 Other long term (current) drug therapy: Secondary | ICD-10-CM | POA: Diagnosis not present

## 2021-11-02 DIAGNOSIS — R000 Tachycardia, unspecified: Secondary | ICD-10-CM | POA: Diagnosis not present

## 2021-11-02 DIAGNOSIS — Z043 Encounter for examination and observation following other accident: Secondary | ICD-10-CM | POA: Diagnosis not present

## 2021-11-02 DIAGNOSIS — U071 COVID-19: Secondary | ICD-10-CM | POA: Diagnosis not present

## 2021-11-02 DIAGNOSIS — Z7984 Long term (current) use of oral hypoglycemic drugs: Secondary | ICD-10-CM | POA: Diagnosis not present

## 2021-11-02 DIAGNOSIS — I10 Essential (primary) hypertension: Secondary | ICD-10-CM | POA: Diagnosis not present

## 2021-11-02 DIAGNOSIS — I517 Cardiomegaly: Secondary | ICD-10-CM | POA: Diagnosis not present

## 2021-11-02 DIAGNOSIS — N390 Urinary tract infection, site not specified: Secondary | ICD-10-CM | POA: Diagnosis not present

## 2021-11-02 DIAGNOSIS — W06XXXA Fall from bed, initial encounter: Secondary | ICD-10-CM | POA: Diagnosis not present

## 2021-11-02 DIAGNOSIS — E079 Disorder of thyroid, unspecified: Secondary | ICD-10-CM | POA: Diagnosis not present

## 2021-11-03 DIAGNOSIS — M170 Bilateral primary osteoarthritis of knee: Secondary | ICD-10-CM | POA: Diagnosis not present

## 2021-11-03 DIAGNOSIS — E538 Deficiency of other specified B group vitamins: Secondary | ICD-10-CM | POA: Diagnosis not present

## 2021-11-03 DIAGNOSIS — Z791 Long term (current) use of non-steroidal anti-inflammatories (NSAID): Secondary | ICD-10-CM | POA: Diagnosis not present

## 2021-11-03 DIAGNOSIS — J1282 Pneumonia due to coronavirus disease 2019: Secondary | ICD-10-CM | POA: Diagnosis not present

## 2021-11-03 DIAGNOSIS — E785 Hyperlipidemia, unspecified: Secondary | ICD-10-CM | POA: Diagnosis not present

## 2021-11-03 DIAGNOSIS — G935 Compression of brain: Secondary | ICD-10-CM | POA: Diagnosis not present

## 2021-11-03 DIAGNOSIS — D649 Anemia, unspecified: Secondary | ICD-10-CM | POA: Diagnosis not present

## 2021-11-03 DIAGNOSIS — F329 Major depressive disorder, single episode, unspecified: Secondary | ICD-10-CM | POA: Diagnosis not present

## 2021-11-03 DIAGNOSIS — D519 Vitamin B12 deficiency anemia, unspecified: Secondary | ICD-10-CM | POA: Diagnosis not present

## 2021-11-03 DIAGNOSIS — I951 Orthostatic hypotension: Secondary | ICD-10-CM | POA: Diagnosis not present

## 2021-11-03 DIAGNOSIS — R471 Dysarthria and anarthria: Secondary | ICD-10-CM | POA: Diagnosis not present

## 2021-11-03 DIAGNOSIS — I10 Essential (primary) hypertension: Secondary | ICD-10-CM | POA: Diagnosis not present

## 2021-11-03 DIAGNOSIS — U071 COVID-19: Secondary | ICD-10-CM | POA: Diagnosis not present

## 2021-11-03 DIAGNOSIS — Z20822 Contact with and (suspected) exposure to covid-19: Secondary | ICD-10-CM | POA: Diagnosis not present

## 2021-11-03 DIAGNOSIS — E039 Hypothyroidism, unspecified: Secondary | ICD-10-CM | POA: Diagnosis not present

## 2021-11-03 DIAGNOSIS — N3000 Acute cystitis without hematuria: Secondary | ICD-10-CM | POA: Diagnosis not present

## 2021-11-03 DIAGNOSIS — E871 Hypo-osmolality and hyponatremia: Secondary | ICD-10-CM | POA: Diagnosis not present

## 2021-11-03 DIAGNOSIS — G9340 Encephalopathy, unspecified: Secondary | ICD-10-CM | POA: Diagnosis not present

## 2021-11-03 DIAGNOSIS — R296 Repeated falls: Secondary | ICD-10-CM | POA: Diagnosis not present

## 2021-11-03 DIAGNOSIS — E1142 Type 2 diabetes mellitus with diabetic polyneuropathy: Secondary | ICD-10-CM | POA: Diagnosis not present

## 2021-11-03 DIAGNOSIS — K219 Gastro-esophageal reflux disease without esophagitis: Secondary | ICD-10-CM | POA: Diagnosis not present

## 2021-11-03 DIAGNOSIS — E1165 Type 2 diabetes mellitus with hyperglycemia: Secondary | ICD-10-CM | POA: Diagnosis not present

## 2021-11-03 DIAGNOSIS — Z66 Do not resuscitate: Secondary | ICD-10-CM | POA: Diagnosis not present

## 2021-11-03 DIAGNOSIS — S065XAA Traumatic subdural hemorrhage with loss of consciousness status unknown, initial encounter: Secondary | ICD-10-CM | POA: Diagnosis not present

## 2021-11-03 DIAGNOSIS — B964 Proteus (mirabilis) (morganii) as the cause of diseases classified elsewhere: Secondary | ICD-10-CM | POA: Diagnosis not present

## 2021-11-03 DIAGNOSIS — R2681 Unsteadiness on feet: Secondary | ICD-10-CM | POA: Diagnosis not present

## 2021-11-12 DIAGNOSIS — Z20822 Contact with and (suspected) exposure to covid-19: Secondary | ICD-10-CM | POA: Diagnosis not present

## 2021-11-13 DIAGNOSIS — I951 Orthostatic hypotension: Secondary | ICD-10-CM | POA: Insufficient documentation

## 2021-11-15 DIAGNOSIS — R202 Paresthesia of skin: Secondary | ICD-10-CM | POA: Diagnosis not present

## 2021-11-15 DIAGNOSIS — R296 Repeated falls: Secondary | ICD-10-CM | POA: Diagnosis not present

## 2021-11-15 DIAGNOSIS — M79661 Pain in right lower leg: Secondary | ICD-10-CM | POA: Diagnosis not present

## 2021-11-15 DIAGNOSIS — E1142 Type 2 diabetes mellitus with diabetic polyneuropathy: Secondary | ICD-10-CM | POA: Diagnosis not present

## 2021-11-15 DIAGNOSIS — M79662 Pain in left lower leg: Secondary | ICD-10-CM | POA: Diagnosis not present

## 2021-11-15 DIAGNOSIS — M19072 Primary osteoarthritis, left ankle and foot: Secondary | ICD-10-CM | POA: Diagnosis not present

## 2021-11-19 DIAGNOSIS — E1142 Type 2 diabetes mellitus with diabetic polyneuropathy: Secondary | ICD-10-CM | POA: Diagnosis not present

## 2021-11-19 DIAGNOSIS — R296 Repeated falls: Secondary | ICD-10-CM | POA: Diagnosis not present

## 2021-11-19 DIAGNOSIS — E1122 Type 2 diabetes mellitus with diabetic chronic kidney disease: Secondary | ICD-10-CM | POA: Diagnosis not present

## 2021-11-19 DIAGNOSIS — R202 Paresthesia of skin: Secondary | ICD-10-CM | POA: Diagnosis not present

## 2021-11-19 DIAGNOSIS — M19072 Primary osteoarthritis, left ankle and foot: Secondary | ICD-10-CM | POA: Diagnosis not present

## 2021-11-19 DIAGNOSIS — M79661 Pain in right lower leg: Secondary | ICD-10-CM | POA: Diagnosis not present

## 2021-11-19 DIAGNOSIS — E039 Hypothyroidism, unspecified: Secondary | ICD-10-CM | POA: Diagnosis not present

## 2021-11-19 DIAGNOSIS — M79662 Pain in left lower leg: Secondary | ICD-10-CM | POA: Diagnosis not present

## 2021-11-20 DIAGNOSIS — R296 Repeated falls: Secondary | ICD-10-CM | POA: Diagnosis not present

## 2021-11-20 DIAGNOSIS — E1142 Type 2 diabetes mellitus with diabetic polyneuropathy: Secondary | ICD-10-CM | POA: Diagnosis not present

## 2021-11-20 DIAGNOSIS — R202 Paresthesia of skin: Secondary | ICD-10-CM | POA: Diagnosis not present

## 2021-11-20 DIAGNOSIS — M79661 Pain in right lower leg: Secondary | ICD-10-CM | POA: Diagnosis not present

## 2021-11-20 DIAGNOSIS — M79662 Pain in left lower leg: Secondary | ICD-10-CM | POA: Diagnosis not present

## 2021-11-20 DIAGNOSIS — M19072 Primary osteoarthritis, left ankle and foot: Secondary | ICD-10-CM | POA: Diagnosis not present

## 2021-11-22 DIAGNOSIS — R296 Repeated falls: Secondary | ICD-10-CM | POA: Diagnosis not present

## 2021-11-22 DIAGNOSIS — R202 Paresthesia of skin: Secondary | ICD-10-CM | POA: Diagnosis not present

## 2021-11-22 DIAGNOSIS — M79661 Pain in right lower leg: Secondary | ICD-10-CM | POA: Diagnosis not present

## 2021-11-22 DIAGNOSIS — M79662 Pain in left lower leg: Secondary | ICD-10-CM | POA: Diagnosis not present

## 2021-11-22 DIAGNOSIS — M19072 Primary osteoarthritis, left ankle and foot: Secondary | ICD-10-CM | POA: Diagnosis not present

## 2021-11-22 DIAGNOSIS — E1142 Type 2 diabetes mellitus with diabetic polyneuropathy: Secondary | ICD-10-CM | POA: Diagnosis not present

## 2021-11-25 DIAGNOSIS — R202 Paresthesia of skin: Secondary | ICD-10-CM | POA: Diagnosis not present

## 2021-11-25 DIAGNOSIS — M79662 Pain in left lower leg: Secondary | ICD-10-CM | POA: Diagnosis not present

## 2021-11-25 DIAGNOSIS — E1142 Type 2 diabetes mellitus with diabetic polyneuropathy: Secondary | ICD-10-CM | POA: Diagnosis not present

## 2021-11-25 DIAGNOSIS — M19072 Primary osteoarthritis, left ankle and foot: Secondary | ICD-10-CM | POA: Diagnosis not present

## 2021-11-25 DIAGNOSIS — M79661 Pain in right lower leg: Secondary | ICD-10-CM | POA: Diagnosis not present

## 2021-11-25 DIAGNOSIS — R296 Repeated falls: Secondary | ICD-10-CM | POA: Diagnosis not present

## 2021-11-26 DIAGNOSIS — R296 Repeated falls: Secondary | ICD-10-CM | POA: Diagnosis not present

## 2021-11-26 DIAGNOSIS — M79661 Pain in right lower leg: Secondary | ICD-10-CM | POA: Diagnosis not present

## 2021-11-26 DIAGNOSIS — M79662 Pain in left lower leg: Secondary | ICD-10-CM | POA: Diagnosis not present

## 2021-11-26 DIAGNOSIS — E1142 Type 2 diabetes mellitus with diabetic polyneuropathy: Secondary | ICD-10-CM | POA: Diagnosis not present

## 2021-11-26 DIAGNOSIS — R202 Paresthesia of skin: Secondary | ICD-10-CM | POA: Diagnosis not present

## 2021-11-26 DIAGNOSIS — M19072 Primary osteoarthritis, left ankle and foot: Secondary | ICD-10-CM | POA: Diagnosis not present

## 2021-11-27 DIAGNOSIS — M79662 Pain in left lower leg: Secondary | ICD-10-CM | POA: Diagnosis not present

## 2021-11-27 DIAGNOSIS — M810 Age-related osteoporosis without current pathological fracture: Secondary | ICD-10-CM | POA: Diagnosis not present

## 2021-11-27 DIAGNOSIS — R296 Repeated falls: Secondary | ICD-10-CM | POA: Diagnosis not present

## 2021-11-27 DIAGNOSIS — M19072 Primary osteoarthritis, left ankle and foot: Secondary | ICD-10-CM | POA: Diagnosis not present

## 2021-11-27 DIAGNOSIS — N390 Urinary tract infection, site not specified: Secondary | ICD-10-CM | POA: Diagnosis not present

## 2021-11-27 DIAGNOSIS — M79661 Pain in right lower leg: Secondary | ICD-10-CM | POA: Diagnosis not present

## 2021-11-27 DIAGNOSIS — E1122 Type 2 diabetes mellitus with diabetic chronic kidney disease: Secondary | ICD-10-CM | POA: Diagnosis not present

## 2021-11-27 DIAGNOSIS — I951 Orthostatic hypotension: Secondary | ICD-10-CM | POA: Diagnosis not present

## 2021-11-27 DIAGNOSIS — E1165 Type 2 diabetes mellitus with hyperglycemia: Secondary | ICD-10-CM | POA: Diagnosis not present

## 2021-11-27 DIAGNOSIS — R2681 Unsteadiness on feet: Secondary | ICD-10-CM | POA: Diagnosis not present

## 2021-11-27 DIAGNOSIS — I129 Hypertensive chronic kidney disease with stage 1 through stage 4 chronic kidney disease, or unspecified chronic kidney disease: Secondary | ICD-10-CM | POA: Diagnosis not present

## 2021-11-27 DIAGNOSIS — N189 Chronic kidney disease, unspecified: Secondary | ICD-10-CM | POA: Diagnosis not present

## 2021-11-27 DIAGNOSIS — E782 Mixed hyperlipidemia: Secondary | ICD-10-CM | POA: Diagnosis not present

## 2021-11-27 DIAGNOSIS — B964 Proteus (mirabilis) (morganii) as the cause of diseases classified elsewhere: Secondary | ICD-10-CM | POA: Diagnosis not present

## 2021-11-27 DIAGNOSIS — E1142 Type 2 diabetes mellitus with diabetic polyneuropathy: Secondary | ICD-10-CM | POA: Diagnosis not present

## 2021-11-27 DIAGNOSIS — S065X0D Traumatic subdural hemorrhage without loss of consciousness, subsequent encounter: Secondary | ICD-10-CM | POA: Diagnosis not present

## 2021-11-27 DIAGNOSIS — E039 Hypothyroidism, unspecified: Secondary | ICD-10-CM | POA: Diagnosis not present

## 2021-11-27 DIAGNOSIS — G9340 Encephalopathy, unspecified: Secondary | ICD-10-CM | POA: Diagnosis not present

## 2021-11-27 DIAGNOSIS — F32A Depression, unspecified: Secondary | ICD-10-CM | POA: Diagnosis not present

## 2021-11-27 DIAGNOSIS — K219 Gastro-esophageal reflux disease without esophagitis: Secondary | ICD-10-CM | POA: Diagnosis not present

## 2021-11-27 DIAGNOSIS — E8881 Metabolic syndrome: Secondary | ICD-10-CM | POA: Diagnosis not present

## 2021-11-27 DIAGNOSIS — D519 Vitamin B12 deficiency anemia, unspecified: Secondary | ICD-10-CM | POA: Diagnosis not present

## 2021-11-27 DIAGNOSIS — M170 Bilateral primary osteoarthritis of knee: Secondary | ICD-10-CM | POA: Diagnosis not present

## 2021-11-27 DIAGNOSIS — U071 COVID-19: Secondary | ICD-10-CM | POA: Diagnosis not present

## 2021-11-27 DIAGNOSIS — R202 Paresthesia of skin: Secondary | ICD-10-CM | POA: Diagnosis not present

## 2021-11-29 DIAGNOSIS — S62317A Displaced fracture of base of fifth metacarpal bone. left hand, initial encounter for closed fracture: Secondary | ICD-10-CM | POA: Diagnosis not present

## 2021-12-03 DIAGNOSIS — G9340 Encephalopathy, unspecified: Secondary | ICD-10-CM | POA: Diagnosis not present

## 2021-12-03 DIAGNOSIS — B964 Proteus (mirabilis) (morganii) as the cause of diseases classified elsewhere: Secondary | ICD-10-CM | POA: Diagnosis not present

## 2021-12-03 DIAGNOSIS — M19072 Primary osteoarthritis, left ankle and foot: Secondary | ICD-10-CM | POA: Diagnosis not present

## 2021-12-03 DIAGNOSIS — M170 Bilateral primary osteoarthritis of knee: Secondary | ICD-10-CM | POA: Diagnosis not present

## 2021-12-03 DIAGNOSIS — S065X0D Traumatic subdural hemorrhage without loss of consciousness, subsequent encounter: Secondary | ICD-10-CM | POA: Diagnosis not present

## 2021-12-03 DIAGNOSIS — N390 Urinary tract infection, site not specified: Secondary | ICD-10-CM | POA: Diagnosis not present

## 2021-12-04 DIAGNOSIS — M19072 Primary osteoarthritis, left ankle and foot: Secondary | ICD-10-CM | POA: Diagnosis not present

## 2021-12-04 DIAGNOSIS — M170 Bilateral primary osteoarthritis of knee: Secondary | ICD-10-CM | POA: Diagnosis not present

## 2021-12-04 DIAGNOSIS — S065X0D Traumatic subdural hemorrhage without loss of consciousness, subsequent encounter: Secondary | ICD-10-CM | POA: Diagnosis not present

## 2021-12-04 DIAGNOSIS — N390 Urinary tract infection, site not specified: Secondary | ICD-10-CM | POA: Diagnosis not present

## 2021-12-04 DIAGNOSIS — B964 Proteus (mirabilis) (morganii) as the cause of diseases classified elsewhere: Secondary | ICD-10-CM | POA: Diagnosis not present

## 2021-12-04 DIAGNOSIS — G9340 Encephalopathy, unspecified: Secondary | ICD-10-CM | POA: Diagnosis not present

## 2021-12-06 DIAGNOSIS — S065X0D Traumatic subdural hemorrhage without loss of consciousness, subsequent encounter: Secondary | ICD-10-CM | POA: Diagnosis not present

## 2021-12-06 DIAGNOSIS — G9340 Encephalopathy, unspecified: Secondary | ICD-10-CM | POA: Diagnosis not present

## 2021-12-06 DIAGNOSIS — M170 Bilateral primary osteoarthritis of knee: Secondary | ICD-10-CM | POA: Diagnosis not present

## 2021-12-06 DIAGNOSIS — M19072 Primary osteoarthritis, left ankle and foot: Secondary | ICD-10-CM | POA: Diagnosis not present

## 2021-12-06 DIAGNOSIS — N390 Urinary tract infection, site not specified: Secondary | ICD-10-CM | POA: Diagnosis not present

## 2021-12-06 DIAGNOSIS — B964 Proteus (mirabilis) (morganii) as the cause of diseases classified elsewhere: Secondary | ICD-10-CM | POA: Diagnosis not present

## 2021-12-06 DIAGNOSIS — U071 COVID-19: Secondary | ICD-10-CM | POA: Diagnosis not present

## 2021-12-06 DIAGNOSIS — I951 Orthostatic hypotension: Secondary | ICD-10-CM | POA: Diagnosis not present

## 2021-12-09 DIAGNOSIS — E119 Type 2 diabetes mellitus without complications: Secondary | ICD-10-CM | POA: Diagnosis not present

## 2021-12-09 DIAGNOSIS — Z794 Long term (current) use of insulin: Secondary | ICD-10-CM | POA: Diagnosis not present

## 2021-12-09 DIAGNOSIS — Z111 Encounter for screening for respiratory tuberculosis: Secondary | ICD-10-CM | POA: Diagnosis not present

## 2021-12-10 DIAGNOSIS — E1140 Type 2 diabetes mellitus with diabetic neuropathy, unspecified: Secondary | ICD-10-CM | POA: Diagnosis not present

## 2021-12-10 DIAGNOSIS — E538 Deficiency of other specified B group vitamins: Secondary | ICD-10-CM | POA: Diagnosis not present

## 2021-12-10 DIAGNOSIS — E8881 Metabolic syndrome: Secondary | ICD-10-CM | POA: Diagnosis not present

## 2021-12-10 DIAGNOSIS — L03116 Cellulitis of left lower limb: Secondary | ICD-10-CM | POA: Diagnosis not present

## 2021-12-10 DIAGNOSIS — E1122 Type 2 diabetes mellitus with diabetic chronic kidney disease: Secondary | ICD-10-CM | POA: Diagnosis not present

## 2021-12-10 DIAGNOSIS — I10 Essential (primary) hypertension: Secondary | ICD-10-CM | POA: Diagnosis not present

## 2021-12-10 DIAGNOSIS — E7849 Other hyperlipidemia: Secondary | ICD-10-CM | POA: Diagnosis not present

## 2021-12-10 DIAGNOSIS — E11621 Type 2 diabetes mellitus with foot ulcer: Secondary | ICD-10-CM | POA: Diagnosis not present

## 2021-12-11 DIAGNOSIS — Z111 Encounter for screening for respiratory tuberculosis: Secondary | ICD-10-CM | POA: Diagnosis not present

## 2021-12-16 DIAGNOSIS — R32 Unspecified urinary incontinence: Secondary | ICD-10-CM | POA: Diagnosis not present

## 2021-12-16 DIAGNOSIS — R52 Pain, unspecified: Secondary | ICD-10-CM | POA: Diagnosis not present

## 2021-12-16 DIAGNOSIS — R1110 Vomiting, unspecified: Secondary | ICD-10-CM | POA: Diagnosis not present

## 2021-12-16 DIAGNOSIS — Q998 Other specified chromosome abnormalities: Secondary | ICD-10-CM | POA: Diagnosis not present

## 2022-01-01 DIAGNOSIS — Z20822 Contact with and (suspected) exposure to covid-19: Secondary | ICD-10-CM | POA: Diagnosis not present

## 2022-01-06 DIAGNOSIS — E1122 Type 2 diabetes mellitus with diabetic chronic kidney disease: Secondary | ICD-10-CM | POA: Diagnosis not present

## 2022-01-06 DIAGNOSIS — M6281 Muscle weakness (generalized): Secondary | ICD-10-CM | POA: Diagnosis not present

## 2022-01-06 DIAGNOSIS — I10 Essential (primary) hypertension: Secondary | ICD-10-CM | POA: Diagnosis not present

## 2022-01-06 DIAGNOSIS — M810 Age-related osteoporosis without current pathological fracture: Secondary | ICD-10-CM | POA: Diagnosis not present

## 2022-01-06 DIAGNOSIS — R269 Unspecified abnormalities of gait and mobility: Secondary | ICD-10-CM | POA: Diagnosis not present

## 2022-01-06 DIAGNOSIS — G6289 Other specified polyneuropathies: Secondary | ICD-10-CM | POA: Diagnosis not present

## 2022-01-06 DIAGNOSIS — R296 Repeated falls: Secondary | ICD-10-CM | POA: Diagnosis not present

## 2022-01-07 DIAGNOSIS — G6289 Other specified polyneuropathies: Secondary | ICD-10-CM | POA: Diagnosis not present

## 2022-01-07 DIAGNOSIS — E1122 Type 2 diabetes mellitus with diabetic chronic kidney disease: Secondary | ICD-10-CM | POA: Diagnosis not present

## 2022-01-07 DIAGNOSIS — I10 Essential (primary) hypertension: Secondary | ICD-10-CM | POA: Diagnosis not present

## 2022-01-07 DIAGNOSIS — M6281 Muscle weakness (generalized): Secondary | ICD-10-CM | POA: Diagnosis not present

## 2022-01-07 DIAGNOSIS — R296 Repeated falls: Secondary | ICD-10-CM | POA: Diagnosis not present

## 2022-01-08 DIAGNOSIS — I10 Essential (primary) hypertension: Secondary | ICD-10-CM | POA: Diagnosis not present

## 2022-01-08 DIAGNOSIS — E1122 Type 2 diabetes mellitus with diabetic chronic kidney disease: Secondary | ICD-10-CM | POA: Diagnosis not present

## 2022-01-08 DIAGNOSIS — R269 Unspecified abnormalities of gait and mobility: Secondary | ICD-10-CM | POA: Diagnosis not present

## 2022-01-08 DIAGNOSIS — G6289 Other specified polyneuropathies: Secondary | ICD-10-CM | POA: Diagnosis not present

## 2022-01-08 DIAGNOSIS — R296 Repeated falls: Secondary | ICD-10-CM | POA: Diagnosis not present

## 2022-01-08 DIAGNOSIS — M810 Age-related osteoporosis without current pathological fracture: Secondary | ICD-10-CM | POA: Diagnosis not present

## 2022-01-08 DIAGNOSIS — M6281 Muscle weakness (generalized): Secondary | ICD-10-CM | POA: Diagnosis not present

## 2022-01-09 DIAGNOSIS — E1122 Type 2 diabetes mellitus with diabetic chronic kidney disease: Secondary | ICD-10-CM | POA: Diagnosis not present

## 2022-01-09 DIAGNOSIS — I10 Essential (primary) hypertension: Secondary | ICD-10-CM | POA: Diagnosis not present

## 2022-01-09 DIAGNOSIS — R296 Repeated falls: Secondary | ICD-10-CM | POA: Diagnosis not present

## 2022-01-09 DIAGNOSIS — G6289 Other specified polyneuropathies: Secondary | ICD-10-CM | POA: Diagnosis not present

## 2022-01-09 DIAGNOSIS — M6281 Muscle weakness (generalized): Secondary | ICD-10-CM | POA: Diagnosis not present

## 2022-01-10 DIAGNOSIS — R296 Repeated falls: Secondary | ICD-10-CM | POA: Diagnosis not present

## 2022-01-10 DIAGNOSIS — E1122 Type 2 diabetes mellitus with diabetic chronic kidney disease: Secondary | ICD-10-CM | POA: Diagnosis not present

## 2022-01-10 DIAGNOSIS — M810 Age-related osteoporosis without current pathological fracture: Secondary | ICD-10-CM | POA: Diagnosis not present

## 2022-01-10 DIAGNOSIS — G6289 Other specified polyneuropathies: Secondary | ICD-10-CM | POA: Diagnosis not present

## 2022-01-10 DIAGNOSIS — M6281 Muscle weakness (generalized): Secondary | ICD-10-CM | POA: Diagnosis not present

## 2022-01-10 DIAGNOSIS — R269 Unspecified abnormalities of gait and mobility: Secondary | ICD-10-CM | POA: Diagnosis not present

## 2022-01-10 DIAGNOSIS — I10 Essential (primary) hypertension: Secondary | ICD-10-CM | POA: Diagnosis not present

## 2022-01-13 DIAGNOSIS — M79674 Pain in right toe(s): Secondary | ICD-10-CM | POA: Diagnosis not present

## 2022-01-13 DIAGNOSIS — E1140 Type 2 diabetes mellitus with diabetic neuropathy, unspecified: Secondary | ICD-10-CM | POA: Diagnosis not present

## 2022-01-13 DIAGNOSIS — L851 Acquired keratosis [keratoderma] palmaris et plantaris: Secondary | ICD-10-CM | POA: Diagnosis not present

## 2022-01-13 DIAGNOSIS — B351 Tinea unguium: Secondary | ICD-10-CM | POA: Diagnosis not present

## 2022-01-13 DIAGNOSIS — M79675 Pain in left toe(s): Secondary | ICD-10-CM | POA: Diagnosis not present

## 2022-01-14 DIAGNOSIS — M6281 Muscle weakness (generalized): Secondary | ICD-10-CM | POA: Diagnosis not present

## 2022-01-14 DIAGNOSIS — E7849 Other hyperlipidemia: Secondary | ICD-10-CM | POA: Diagnosis not present

## 2022-01-14 DIAGNOSIS — R269 Unspecified abnormalities of gait and mobility: Secondary | ICD-10-CM | POA: Diagnosis not present

## 2022-01-14 DIAGNOSIS — K2100 Gastro-esophageal reflux disease with esophagitis, without bleeding: Secondary | ICD-10-CM | POA: Diagnosis not present

## 2022-01-14 DIAGNOSIS — D519 Vitamin B12 deficiency anemia, unspecified: Secondary | ICD-10-CM | POA: Diagnosis not present

## 2022-01-14 DIAGNOSIS — N183 Chronic kidney disease, stage 3 unspecified: Secondary | ICD-10-CM | POA: Diagnosis not present

## 2022-01-14 DIAGNOSIS — E1122 Type 2 diabetes mellitus with diabetic chronic kidney disease: Secondary | ICD-10-CM | POA: Diagnosis not present

## 2022-01-14 DIAGNOSIS — R296 Repeated falls: Secondary | ICD-10-CM | POA: Diagnosis not present

## 2022-01-14 DIAGNOSIS — I10 Essential (primary) hypertension: Secondary | ICD-10-CM | POA: Diagnosis not present

## 2022-01-14 DIAGNOSIS — E559 Vitamin D deficiency, unspecified: Secondary | ICD-10-CM | POA: Diagnosis not present

## 2022-01-14 DIAGNOSIS — E538 Deficiency of other specified B group vitamins: Secondary | ICD-10-CM | POA: Diagnosis not present

## 2022-01-14 DIAGNOSIS — G6289 Other specified polyneuropathies: Secondary | ICD-10-CM | POA: Diagnosis not present

## 2022-01-14 DIAGNOSIS — M810 Age-related osteoporosis without current pathological fracture: Secondary | ICD-10-CM | POA: Diagnosis not present

## 2022-01-14 DIAGNOSIS — E039 Hypothyroidism, unspecified: Secondary | ICD-10-CM | POA: Diagnosis not present

## 2022-01-14 DIAGNOSIS — E11621 Type 2 diabetes mellitus with foot ulcer: Secondary | ICD-10-CM | POA: Diagnosis not present

## 2022-01-14 DIAGNOSIS — E782 Mixed hyperlipidemia: Secondary | ICD-10-CM | POA: Diagnosis not present

## 2022-01-16 DIAGNOSIS — M6281 Muscle weakness (generalized): Secondary | ICD-10-CM | POA: Diagnosis not present

## 2022-01-16 DIAGNOSIS — G6289 Other specified polyneuropathies: Secondary | ICD-10-CM | POA: Diagnosis not present

## 2022-01-16 DIAGNOSIS — M810 Age-related osteoporosis without current pathological fracture: Secondary | ICD-10-CM | POA: Diagnosis not present

## 2022-01-16 DIAGNOSIS — E1122 Type 2 diabetes mellitus with diabetic chronic kidney disease: Secondary | ICD-10-CM | POA: Diagnosis not present

## 2022-01-16 DIAGNOSIS — R296 Repeated falls: Secondary | ICD-10-CM | POA: Diagnosis not present

## 2022-01-16 DIAGNOSIS — I10 Essential (primary) hypertension: Secondary | ICD-10-CM | POA: Diagnosis not present

## 2022-01-16 DIAGNOSIS — R269 Unspecified abnormalities of gait and mobility: Secondary | ICD-10-CM | POA: Diagnosis not present

## 2022-01-17 DIAGNOSIS — R296 Repeated falls: Secondary | ICD-10-CM | POA: Diagnosis not present

## 2022-01-17 DIAGNOSIS — M810 Age-related osteoporosis without current pathological fracture: Secondary | ICD-10-CM | POA: Diagnosis not present

## 2022-01-17 DIAGNOSIS — M6281 Muscle weakness (generalized): Secondary | ICD-10-CM | POA: Diagnosis not present

## 2022-01-17 DIAGNOSIS — E1122 Type 2 diabetes mellitus with diabetic chronic kidney disease: Secondary | ICD-10-CM | POA: Diagnosis not present

## 2022-01-17 DIAGNOSIS — G6289 Other specified polyneuropathies: Secondary | ICD-10-CM | POA: Diagnosis not present

## 2022-01-17 DIAGNOSIS — I10 Essential (primary) hypertension: Secondary | ICD-10-CM | POA: Diagnosis not present

## 2022-01-17 DIAGNOSIS — R269 Unspecified abnormalities of gait and mobility: Secondary | ICD-10-CM | POA: Diagnosis not present

## 2022-01-20 DIAGNOSIS — R269 Unspecified abnormalities of gait and mobility: Secondary | ICD-10-CM | POA: Diagnosis not present

## 2022-01-20 DIAGNOSIS — G6289 Other specified polyneuropathies: Secondary | ICD-10-CM | POA: Diagnosis not present

## 2022-01-20 DIAGNOSIS — E1122 Type 2 diabetes mellitus with diabetic chronic kidney disease: Secondary | ICD-10-CM | POA: Diagnosis not present

## 2022-01-20 DIAGNOSIS — M810 Age-related osteoporosis without current pathological fracture: Secondary | ICD-10-CM | POA: Diagnosis not present

## 2022-01-20 DIAGNOSIS — R296 Repeated falls: Secondary | ICD-10-CM | POA: Diagnosis not present

## 2022-01-20 DIAGNOSIS — I10 Essential (primary) hypertension: Secondary | ICD-10-CM | POA: Diagnosis not present

## 2022-01-20 DIAGNOSIS — M6281 Muscle weakness (generalized): Secondary | ICD-10-CM | POA: Diagnosis not present

## 2022-01-21 DIAGNOSIS — E1122 Type 2 diabetes mellitus with diabetic chronic kidney disease: Secondary | ICD-10-CM | POA: Diagnosis not present

## 2022-01-21 DIAGNOSIS — I10 Essential (primary) hypertension: Secondary | ICD-10-CM | POA: Diagnosis not present

## 2022-01-21 DIAGNOSIS — G6289 Other specified polyneuropathies: Secondary | ICD-10-CM | POA: Diagnosis not present

## 2022-01-21 DIAGNOSIS — R296 Repeated falls: Secondary | ICD-10-CM | POA: Diagnosis not present

## 2022-01-21 DIAGNOSIS — M6281 Muscle weakness (generalized): Secondary | ICD-10-CM | POA: Diagnosis not present

## 2022-01-22 DIAGNOSIS — E538 Deficiency of other specified B group vitamins: Secondary | ICD-10-CM | POA: Diagnosis not present

## 2022-01-22 DIAGNOSIS — E1122 Type 2 diabetes mellitus with diabetic chronic kidney disease: Secondary | ICD-10-CM | POA: Diagnosis not present

## 2022-01-22 DIAGNOSIS — I10 Essential (primary) hypertension: Secondary | ICD-10-CM | POA: Diagnosis not present

## 2022-01-22 DIAGNOSIS — E8881 Metabolic syndrome: Secondary | ICD-10-CM | POA: Diagnosis not present

## 2022-01-22 DIAGNOSIS — E7849 Other hyperlipidemia: Secondary | ICD-10-CM | POA: Diagnosis not present

## 2022-01-22 DIAGNOSIS — E1140 Type 2 diabetes mellitus with diabetic neuropathy, unspecified: Secondary | ICD-10-CM | POA: Diagnosis not present

## 2022-01-22 DIAGNOSIS — E559 Vitamin D deficiency, unspecified: Secondary | ICD-10-CM | POA: Diagnosis not present

## 2022-01-22 DIAGNOSIS — E11621 Type 2 diabetes mellitus with foot ulcer: Secondary | ICD-10-CM | POA: Diagnosis not present

## 2022-01-23 DIAGNOSIS — I10 Essential (primary) hypertension: Secondary | ICD-10-CM | POA: Diagnosis not present

## 2022-01-23 DIAGNOSIS — R296 Repeated falls: Secondary | ICD-10-CM | POA: Diagnosis not present

## 2022-01-23 DIAGNOSIS — M6281 Muscle weakness (generalized): Secondary | ICD-10-CM | POA: Diagnosis not present

## 2022-01-23 DIAGNOSIS — R269 Unspecified abnormalities of gait and mobility: Secondary | ICD-10-CM | POA: Diagnosis not present

## 2022-01-23 DIAGNOSIS — M810 Age-related osteoporosis without current pathological fracture: Secondary | ICD-10-CM | POA: Diagnosis not present

## 2022-01-23 DIAGNOSIS — G6289 Other specified polyneuropathies: Secondary | ICD-10-CM | POA: Diagnosis not present

## 2022-01-23 DIAGNOSIS — E1122 Type 2 diabetes mellitus with diabetic chronic kidney disease: Secondary | ICD-10-CM | POA: Diagnosis not present

## 2022-01-24 DIAGNOSIS — G6289 Other specified polyneuropathies: Secondary | ICD-10-CM | POA: Diagnosis not present

## 2022-01-24 DIAGNOSIS — I10 Essential (primary) hypertension: Secondary | ICD-10-CM | POA: Diagnosis not present

## 2022-01-24 DIAGNOSIS — M6281 Muscle weakness (generalized): Secondary | ICD-10-CM | POA: Diagnosis not present

## 2022-01-24 DIAGNOSIS — R296 Repeated falls: Secondary | ICD-10-CM | POA: Diagnosis not present

## 2022-01-24 DIAGNOSIS — R269 Unspecified abnormalities of gait and mobility: Secondary | ICD-10-CM | POA: Diagnosis not present

## 2022-01-24 DIAGNOSIS — E1122 Type 2 diabetes mellitus with diabetic chronic kidney disease: Secondary | ICD-10-CM | POA: Diagnosis not present

## 2022-01-24 DIAGNOSIS — M810 Age-related osteoporosis without current pathological fracture: Secondary | ICD-10-CM | POA: Diagnosis not present

## 2022-01-27 DIAGNOSIS — G6289 Other specified polyneuropathies: Secondary | ICD-10-CM | POA: Diagnosis not present

## 2022-01-27 DIAGNOSIS — E1122 Type 2 diabetes mellitus with diabetic chronic kidney disease: Secondary | ICD-10-CM | POA: Diagnosis not present

## 2022-01-27 DIAGNOSIS — R269 Unspecified abnormalities of gait and mobility: Secondary | ICD-10-CM | POA: Diagnosis not present

## 2022-01-27 DIAGNOSIS — M810 Age-related osteoporosis without current pathological fracture: Secondary | ICD-10-CM | POA: Diagnosis not present

## 2022-01-27 DIAGNOSIS — R296 Repeated falls: Secondary | ICD-10-CM | POA: Diagnosis not present

## 2022-01-27 DIAGNOSIS — I10 Essential (primary) hypertension: Secondary | ICD-10-CM | POA: Diagnosis not present

## 2022-01-27 DIAGNOSIS — M6281 Muscle weakness (generalized): Secondary | ICD-10-CM | POA: Diagnosis not present

## 2022-01-28 ENCOUNTER — Other Ambulatory Visit (HOSPITAL_COMMUNITY): Payer: Medicare Other

## 2022-01-28 DIAGNOSIS — G6289 Other specified polyneuropathies: Secondary | ICD-10-CM | POA: Diagnosis not present

## 2022-01-28 DIAGNOSIS — R296 Repeated falls: Secondary | ICD-10-CM | POA: Diagnosis not present

## 2022-01-28 DIAGNOSIS — I10 Essential (primary) hypertension: Secondary | ICD-10-CM | POA: Diagnosis not present

## 2022-01-28 DIAGNOSIS — E1122 Type 2 diabetes mellitus with diabetic chronic kidney disease: Secondary | ICD-10-CM | POA: Diagnosis not present

## 2022-01-28 DIAGNOSIS — M6281 Muscle weakness (generalized): Secondary | ICD-10-CM | POA: Diagnosis not present

## 2022-01-30 DIAGNOSIS — M810 Age-related osteoporosis without current pathological fracture: Secondary | ICD-10-CM | POA: Diagnosis not present

## 2022-01-30 DIAGNOSIS — I10 Essential (primary) hypertension: Secondary | ICD-10-CM | POA: Diagnosis not present

## 2022-01-30 DIAGNOSIS — M6281 Muscle weakness (generalized): Secondary | ICD-10-CM | POA: Diagnosis not present

## 2022-01-30 DIAGNOSIS — R269 Unspecified abnormalities of gait and mobility: Secondary | ICD-10-CM | POA: Diagnosis not present

## 2022-01-30 DIAGNOSIS — E1122 Type 2 diabetes mellitus with diabetic chronic kidney disease: Secondary | ICD-10-CM | POA: Diagnosis not present

## 2022-01-30 DIAGNOSIS — G6289 Other specified polyneuropathies: Secondary | ICD-10-CM | POA: Diagnosis not present

## 2022-01-30 DIAGNOSIS — R296 Repeated falls: Secondary | ICD-10-CM | POA: Diagnosis not present

## 2022-01-31 DIAGNOSIS — I10 Essential (primary) hypertension: Secondary | ICD-10-CM | POA: Diagnosis not present

## 2022-01-31 DIAGNOSIS — M810 Age-related osteoporosis without current pathological fracture: Secondary | ICD-10-CM | POA: Diagnosis not present

## 2022-01-31 DIAGNOSIS — R296 Repeated falls: Secondary | ICD-10-CM | POA: Diagnosis not present

## 2022-01-31 DIAGNOSIS — R269 Unspecified abnormalities of gait and mobility: Secondary | ICD-10-CM | POA: Diagnosis not present

## 2022-01-31 DIAGNOSIS — E1122 Type 2 diabetes mellitus with diabetic chronic kidney disease: Secondary | ICD-10-CM | POA: Diagnosis not present

## 2022-01-31 DIAGNOSIS — G6289 Other specified polyneuropathies: Secondary | ICD-10-CM | POA: Diagnosis not present

## 2022-01-31 DIAGNOSIS — M6281 Muscle weakness (generalized): Secondary | ICD-10-CM | POA: Diagnosis not present

## 2022-02-03 DIAGNOSIS — G6289 Other specified polyneuropathies: Secondary | ICD-10-CM | POA: Diagnosis not present

## 2022-02-03 DIAGNOSIS — E1122 Type 2 diabetes mellitus with diabetic chronic kidney disease: Secondary | ICD-10-CM | POA: Diagnosis not present

## 2022-02-03 DIAGNOSIS — R296 Repeated falls: Secondary | ICD-10-CM | POA: Diagnosis not present

## 2022-02-03 DIAGNOSIS — M6281 Muscle weakness (generalized): Secondary | ICD-10-CM | POA: Diagnosis not present

## 2022-02-03 DIAGNOSIS — I10 Essential (primary) hypertension: Secondary | ICD-10-CM | POA: Diagnosis not present

## 2022-02-04 DIAGNOSIS — M810 Age-related osteoporosis without current pathological fracture: Secondary | ICD-10-CM | POA: Diagnosis not present

## 2022-02-04 DIAGNOSIS — R296 Repeated falls: Secondary | ICD-10-CM | POA: Diagnosis not present

## 2022-02-04 DIAGNOSIS — M6281 Muscle weakness (generalized): Secondary | ICD-10-CM | POA: Diagnosis not present

## 2022-02-04 DIAGNOSIS — R269 Unspecified abnormalities of gait and mobility: Secondary | ICD-10-CM | POA: Diagnosis not present

## 2022-02-04 DIAGNOSIS — I10 Essential (primary) hypertension: Secondary | ICD-10-CM | POA: Diagnosis not present

## 2022-02-04 DIAGNOSIS — G6289 Other specified polyneuropathies: Secondary | ICD-10-CM | POA: Diagnosis not present

## 2022-02-04 DIAGNOSIS — E1122 Type 2 diabetes mellitus with diabetic chronic kidney disease: Secondary | ICD-10-CM | POA: Diagnosis not present

## 2022-02-05 DIAGNOSIS — R296 Repeated falls: Secondary | ICD-10-CM | POA: Diagnosis not present

## 2022-02-05 DIAGNOSIS — G6289 Other specified polyneuropathies: Secondary | ICD-10-CM | POA: Diagnosis not present

## 2022-02-05 DIAGNOSIS — R269 Unspecified abnormalities of gait and mobility: Secondary | ICD-10-CM | POA: Diagnosis not present

## 2022-02-05 DIAGNOSIS — M6281 Muscle weakness (generalized): Secondary | ICD-10-CM | POA: Diagnosis not present

## 2022-02-05 DIAGNOSIS — M810 Age-related osteoporosis without current pathological fracture: Secondary | ICD-10-CM | POA: Diagnosis not present

## 2022-02-05 DIAGNOSIS — I10 Essential (primary) hypertension: Secondary | ICD-10-CM | POA: Diagnosis not present

## 2022-02-05 DIAGNOSIS — E1122 Type 2 diabetes mellitus with diabetic chronic kidney disease: Secondary | ICD-10-CM | POA: Diagnosis not present

## 2022-02-06 DIAGNOSIS — R296 Repeated falls: Secondary | ICD-10-CM | POA: Diagnosis not present

## 2022-02-06 DIAGNOSIS — R269 Unspecified abnormalities of gait and mobility: Secondary | ICD-10-CM | POA: Diagnosis not present

## 2022-02-06 DIAGNOSIS — M6281 Muscle weakness (generalized): Secondary | ICD-10-CM | POA: Diagnosis not present

## 2022-02-06 DIAGNOSIS — M810 Age-related osteoporosis without current pathological fracture: Secondary | ICD-10-CM | POA: Diagnosis not present

## 2022-02-06 DIAGNOSIS — E1122 Type 2 diabetes mellitus with diabetic chronic kidney disease: Secondary | ICD-10-CM | POA: Diagnosis not present

## 2022-02-06 DIAGNOSIS — I10 Essential (primary) hypertension: Secondary | ICD-10-CM | POA: Diagnosis not present

## 2022-02-06 DIAGNOSIS — G6289 Other specified polyneuropathies: Secondary | ICD-10-CM | POA: Diagnosis not present

## 2022-02-10 ENCOUNTER — Ambulatory Visit: Admit: 2022-02-10 | Payer: Medicare Other | Admitting: Orthopedic Surgery

## 2022-02-10 DIAGNOSIS — M1712 Unilateral primary osteoarthritis, left knee: Secondary | ICD-10-CM

## 2022-02-10 SURGERY — ARTHROPLASTY, KNEE, TOTAL
Anesthesia: Choice | Site: Knee | Laterality: Left

## 2022-02-11 DIAGNOSIS — E7849 Other hyperlipidemia: Secondary | ICD-10-CM | POA: Diagnosis not present

## 2022-02-11 DIAGNOSIS — R296 Repeated falls: Secondary | ICD-10-CM | POA: Diagnosis not present

## 2022-02-11 DIAGNOSIS — E11621 Type 2 diabetes mellitus with foot ulcer: Secondary | ICD-10-CM | POA: Diagnosis not present

## 2022-02-11 DIAGNOSIS — R269 Unspecified abnormalities of gait and mobility: Secondary | ICD-10-CM | POA: Diagnosis not present

## 2022-02-11 DIAGNOSIS — I10 Essential (primary) hypertension: Secondary | ICD-10-CM | POA: Diagnosis not present

## 2022-02-11 DIAGNOSIS — M6281 Muscle weakness (generalized): Secondary | ICD-10-CM | POA: Diagnosis not present

## 2022-02-11 DIAGNOSIS — E559 Vitamin D deficiency, unspecified: Secondary | ICD-10-CM | POA: Diagnosis not present

## 2022-02-11 DIAGNOSIS — G6289 Other specified polyneuropathies: Secondary | ICD-10-CM | POA: Diagnosis not present

## 2022-02-11 DIAGNOSIS — E538 Deficiency of other specified B group vitamins: Secondary | ICD-10-CM | POA: Diagnosis not present

## 2022-02-11 DIAGNOSIS — M810 Age-related osteoporosis without current pathological fracture: Secondary | ICD-10-CM | POA: Diagnosis not present

## 2022-02-11 DIAGNOSIS — E1140 Type 2 diabetes mellitus with diabetic neuropathy, unspecified: Secondary | ICD-10-CM | POA: Diagnosis not present

## 2022-02-11 DIAGNOSIS — M19012 Primary osteoarthritis, left shoulder: Secondary | ICD-10-CM | POA: Diagnosis not present

## 2022-02-11 DIAGNOSIS — E1122 Type 2 diabetes mellitus with diabetic chronic kidney disease: Secondary | ICD-10-CM | POA: Diagnosis not present

## 2022-02-13 DIAGNOSIS — I10 Essential (primary) hypertension: Secondary | ICD-10-CM | POA: Diagnosis not present

## 2022-02-13 DIAGNOSIS — R269 Unspecified abnormalities of gait and mobility: Secondary | ICD-10-CM | POA: Diagnosis not present

## 2022-02-13 DIAGNOSIS — R296 Repeated falls: Secondary | ICD-10-CM | POA: Diagnosis not present

## 2022-02-13 DIAGNOSIS — E1122 Type 2 diabetes mellitus with diabetic chronic kidney disease: Secondary | ICD-10-CM | POA: Diagnosis not present

## 2022-02-13 DIAGNOSIS — M6281 Muscle weakness (generalized): Secondary | ICD-10-CM | POA: Diagnosis not present

## 2022-02-13 DIAGNOSIS — M810 Age-related osteoporosis without current pathological fracture: Secondary | ICD-10-CM | POA: Diagnosis not present

## 2022-02-13 DIAGNOSIS — G6289 Other specified polyneuropathies: Secondary | ICD-10-CM | POA: Diagnosis not present

## 2022-02-14 DIAGNOSIS — M6281 Muscle weakness (generalized): Secondary | ICD-10-CM | POA: Diagnosis not present

## 2022-02-14 DIAGNOSIS — G6289 Other specified polyneuropathies: Secondary | ICD-10-CM | POA: Diagnosis not present

## 2022-02-14 DIAGNOSIS — R296 Repeated falls: Secondary | ICD-10-CM | POA: Diagnosis not present

## 2022-02-14 DIAGNOSIS — I10 Essential (primary) hypertension: Secondary | ICD-10-CM | POA: Diagnosis not present

## 2022-02-14 DIAGNOSIS — R269 Unspecified abnormalities of gait and mobility: Secondary | ICD-10-CM | POA: Diagnosis not present

## 2022-02-14 DIAGNOSIS — M810 Age-related osteoporosis without current pathological fracture: Secondary | ICD-10-CM | POA: Diagnosis not present

## 2022-02-14 DIAGNOSIS — E1122 Type 2 diabetes mellitus with diabetic chronic kidney disease: Secondary | ICD-10-CM | POA: Diagnosis not present

## 2022-02-17 DIAGNOSIS — Z20822 Contact with and (suspected) exposure to covid-19: Secondary | ICD-10-CM | POA: Diagnosis not present

## 2022-02-17 DIAGNOSIS — M1712 Unilateral primary osteoarthritis, left knee: Secondary | ICD-10-CM | POA: Diagnosis not present

## 2022-02-17 DIAGNOSIS — Z6834 Body mass index (BMI) 34.0-34.9, adult: Secondary | ICD-10-CM | POA: Diagnosis not present

## 2022-02-17 DIAGNOSIS — G9050 Complex regional pain syndrome I, unspecified: Secondary | ICD-10-CM | POA: Diagnosis not present

## 2022-02-19 DIAGNOSIS — M6281 Muscle weakness (generalized): Secondary | ICD-10-CM | POA: Diagnosis not present

## 2022-02-19 DIAGNOSIS — M810 Age-related osteoporosis without current pathological fracture: Secondary | ICD-10-CM | POA: Diagnosis not present

## 2022-02-19 DIAGNOSIS — G6289 Other specified polyneuropathies: Secondary | ICD-10-CM | POA: Diagnosis not present

## 2022-02-19 DIAGNOSIS — I10 Essential (primary) hypertension: Secondary | ICD-10-CM | POA: Diagnosis not present

## 2022-02-19 DIAGNOSIS — R296 Repeated falls: Secondary | ICD-10-CM | POA: Diagnosis not present

## 2022-02-19 DIAGNOSIS — E1122 Type 2 diabetes mellitus with diabetic chronic kidney disease: Secondary | ICD-10-CM | POA: Diagnosis not present

## 2022-02-19 DIAGNOSIS — R269 Unspecified abnormalities of gait and mobility: Secondary | ICD-10-CM | POA: Diagnosis not present

## 2022-02-20 DIAGNOSIS — I10 Essential (primary) hypertension: Secondary | ICD-10-CM | POA: Diagnosis not present

## 2022-02-20 DIAGNOSIS — M6281 Muscle weakness (generalized): Secondary | ICD-10-CM | POA: Diagnosis not present

## 2022-02-20 DIAGNOSIS — G6289 Other specified polyneuropathies: Secondary | ICD-10-CM | POA: Diagnosis not present

## 2022-02-20 DIAGNOSIS — R296 Repeated falls: Secondary | ICD-10-CM | POA: Diagnosis not present

## 2022-02-20 DIAGNOSIS — R269 Unspecified abnormalities of gait and mobility: Secondary | ICD-10-CM | POA: Diagnosis not present

## 2022-02-20 DIAGNOSIS — E1122 Type 2 diabetes mellitus with diabetic chronic kidney disease: Secondary | ICD-10-CM | POA: Diagnosis not present

## 2022-02-20 DIAGNOSIS — M810 Age-related osteoporosis without current pathological fracture: Secondary | ICD-10-CM | POA: Diagnosis not present

## 2022-02-21 DIAGNOSIS — G6289 Other specified polyneuropathies: Secondary | ICD-10-CM | POA: Diagnosis not present

## 2022-02-21 DIAGNOSIS — R269 Unspecified abnormalities of gait and mobility: Secondary | ICD-10-CM | POA: Diagnosis not present

## 2022-02-21 DIAGNOSIS — R296 Repeated falls: Secondary | ICD-10-CM | POA: Diagnosis not present

## 2022-02-21 DIAGNOSIS — M810 Age-related osteoporosis without current pathological fracture: Secondary | ICD-10-CM | POA: Diagnosis not present

## 2022-02-21 DIAGNOSIS — E1122 Type 2 diabetes mellitus with diabetic chronic kidney disease: Secondary | ICD-10-CM | POA: Diagnosis not present

## 2022-02-21 DIAGNOSIS — I10 Essential (primary) hypertension: Secondary | ICD-10-CM | POA: Diagnosis not present

## 2022-02-21 DIAGNOSIS — M6281 Muscle weakness (generalized): Secondary | ICD-10-CM | POA: Diagnosis not present

## 2022-02-24 DIAGNOSIS — E1122 Type 2 diabetes mellitus with diabetic chronic kidney disease: Secondary | ICD-10-CM | POA: Diagnosis not present

## 2022-02-24 DIAGNOSIS — I10 Essential (primary) hypertension: Secondary | ICD-10-CM | POA: Diagnosis not present

## 2022-02-24 DIAGNOSIS — M6281 Muscle weakness (generalized): Secondary | ICD-10-CM | POA: Diagnosis not present

## 2022-02-24 DIAGNOSIS — R296 Repeated falls: Secondary | ICD-10-CM | POA: Diagnosis not present

## 2022-02-24 DIAGNOSIS — G6289 Other specified polyneuropathies: Secondary | ICD-10-CM | POA: Diagnosis not present

## 2022-02-24 DIAGNOSIS — M810 Age-related osteoporosis without current pathological fracture: Secondary | ICD-10-CM | POA: Diagnosis not present

## 2022-02-24 DIAGNOSIS — R269 Unspecified abnormalities of gait and mobility: Secondary | ICD-10-CM | POA: Diagnosis not present

## 2022-02-25 DIAGNOSIS — E1122 Type 2 diabetes mellitus with diabetic chronic kidney disease: Secondary | ICD-10-CM | POA: Diagnosis not present

## 2022-02-25 DIAGNOSIS — R296 Repeated falls: Secondary | ICD-10-CM | POA: Diagnosis not present

## 2022-02-25 DIAGNOSIS — G6289 Other specified polyneuropathies: Secondary | ICD-10-CM | POA: Diagnosis not present

## 2022-02-25 DIAGNOSIS — I10 Essential (primary) hypertension: Secondary | ICD-10-CM | POA: Diagnosis not present

## 2022-02-25 DIAGNOSIS — M6281 Muscle weakness (generalized): Secondary | ICD-10-CM | POA: Diagnosis not present

## 2022-02-25 DIAGNOSIS — M1711 Unilateral primary osteoarthritis, right knee: Secondary | ICD-10-CM | POA: Diagnosis not present

## 2022-02-25 DIAGNOSIS — M810 Age-related osteoporosis without current pathological fracture: Secondary | ICD-10-CM | POA: Diagnosis not present

## 2022-02-25 DIAGNOSIS — Z6834 Body mass index (BMI) 34.0-34.9, adult: Secondary | ICD-10-CM | POA: Diagnosis not present

## 2022-02-26 DIAGNOSIS — M6281 Muscle weakness (generalized): Secondary | ICD-10-CM | POA: Diagnosis not present

## 2022-02-26 DIAGNOSIS — R296 Repeated falls: Secondary | ICD-10-CM | POA: Diagnosis not present

## 2022-02-26 DIAGNOSIS — R269 Unspecified abnormalities of gait and mobility: Secondary | ICD-10-CM | POA: Diagnosis not present

## 2022-02-26 DIAGNOSIS — E1122 Type 2 diabetes mellitus with diabetic chronic kidney disease: Secondary | ICD-10-CM | POA: Diagnosis not present

## 2022-02-26 DIAGNOSIS — M810 Age-related osteoporosis without current pathological fracture: Secondary | ICD-10-CM | POA: Diagnosis not present

## 2022-02-26 DIAGNOSIS — I10 Essential (primary) hypertension: Secondary | ICD-10-CM | POA: Diagnosis not present

## 2022-02-26 DIAGNOSIS — G6289 Other specified polyneuropathies: Secondary | ICD-10-CM | POA: Diagnosis not present

## 2022-02-27 DIAGNOSIS — M810 Age-related osteoporosis without current pathological fracture: Secondary | ICD-10-CM | POA: Diagnosis not present

## 2022-02-27 DIAGNOSIS — G6289 Other specified polyneuropathies: Secondary | ICD-10-CM | POA: Diagnosis not present

## 2022-02-27 DIAGNOSIS — M6281 Muscle weakness (generalized): Secondary | ICD-10-CM | POA: Diagnosis not present

## 2022-02-27 DIAGNOSIS — E1122 Type 2 diabetes mellitus with diabetic chronic kidney disease: Secondary | ICD-10-CM | POA: Diagnosis not present

## 2022-02-27 DIAGNOSIS — I10 Essential (primary) hypertension: Secondary | ICD-10-CM | POA: Diagnosis not present

## 2022-02-27 DIAGNOSIS — R269 Unspecified abnormalities of gait and mobility: Secondary | ICD-10-CM | POA: Diagnosis not present

## 2022-02-27 DIAGNOSIS — R296 Repeated falls: Secondary | ICD-10-CM | POA: Diagnosis not present

## 2022-03-03 DIAGNOSIS — R269 Unspecified abnormalities of gait and mobility: Secondary | ICD-10-CM | POA: Diagnosis not present

## 2022-03-03 DIAGNOSIS — R296 Repeated falls: Secondary | ICD-10-CM | POA: Diagnosis not present

## 2022-03-03 DIAGNOSIS — I10 Essential (primary) hypertension: Secondary | ICD-10-CM | POA: Diagnosis not present

## 2022-03-03 DIAGNOSIS — E1122 Type 2 diabetes mellitus with diabetic chronic kidney disease: Secondary | ICD-10-CM | POA: Diagnosis not present

## 2022-03-03 DIAGNOSIS — M6281 Muscle weakness (generalized): Secondary | ICD-10-CM | POA: Diagnosis not present

## 2022-03-03 DIAGNOSIS — G6289 Other specified polyneuropathies: Secondary | ICD-10-CM | POA: Diagnosis not present

## 2022-03-03 DIAGNOSIS — M810 Age-related osteoporosis without current pathological fracture: Secondary | ICD-10-CM | POA: Diagnosis not present

## 2022-03-04 DIAGNOSIS — I10 Essential (primary) hypertension: Secondary | ICD-10-CM | POA: Diagnosis not present

## 2022-03-04 DIAGNOSIS — G6289 Other specified polyneuropathies: Secondary | ICD-10-CM | POA: Diagnosis not present

## 2022-03-04 DIAGNOSIS — E1122 Type 2 diabetes mellitus with diabetic chronic kidney disease: Secondary | ICD-10-CM | POA: Diagnosis not present

## 2022-03-04 DIAGNOSIS — R296 Repeated falls: Secondary | ICD-10-CM | POA: Diagnosis not present

## 2022-03-04 DIAGNOSIS — M6281 Muscle weakness (generalized): Secondary | ICD-10-CM | POA: Diagnosis not present

## 2022-03-05 DIAGNOSIS — M6281 Muscle weakness (generalized): Secondary | ICD-10-CM | POA: Diagnosis not present

## 2022-03-05 DIAGNOSIS — E1122 Type 2 diabetes mellitus with diabetic chronic kidney disease: Secondary | ICD-10-CM | POA: Diagnosis not present

## 2022-03-05 DIAGNOSIS — I10 Essential (primary) hypertension: Secondary | ICD-10-CM | POA: Diagnosis not present

## 2022-03-05 DIAGNOSIS — G6289 Other specified polyneuropathies: Secondary | ICD-10-CM | POA: Diagnosis not present

## 2022-03-05 DIAGNOSIS — R296 Repeated falls: Secondary | ICD-10-CM | POA: Diagnosis not present

## 2022-03-05 DIAGNOSIS — M810 Age-related osteoporosis without current pathological fracture: Secondary | ICD-10-CM | POA: Diagnosis not present

## 2022-03-05 DIAGNOSIS — R269 Unspecified abnormalities of gait and mobility: Secondary | ICD-10-CM | POA: Diagnosis not present

## 2022-03-06 DIAGNOSIS — E1122 Type 2 diabetes mellitus with diabetic chronic kidney disease: Secondary | ICD-10-CM | POA: Diagnosis not present

## 2022-03-06 DIAGNOSIS — R296 Repeated falls: Secondary | ICD-10-CM | POA: Diagnosis not present

## 2022-03-06 DIAGNOSIS — G6289 Other specified polyneuropathies: Secondary | ICD-10-CM | POA: Diagnosis not present

## 2022-03-06 DIAGNOSIS — M6281 Muscle weakness (generalized): Secondary | ICD-10-CM | POA: Diagnosis not present

## 2022-03-06 DIAGNOSIS — I10 Essential (primary) hypertension: Secondary | ICD-10-CM | POA: Diagnosis not present

## 2022-03-07 DIAGNOSIS — M6281 Muscle weakness (generalized): Secondary | ICD-10-CM | POA: Diagnosis not present

## 2022-03-07 DIAGNOSIS — R269 Unspecified abnormalities of gait and mobility: Secondary | ICD-10-CM | POA: Diagnosis not present

## 2022-03-07 DIAGNOSIS — M810 Age-related osteoporosis without current pathological fracture: Secondary | ICD-10-CM | POA: Diagnosis not present

## 2022-03-07 DIAGNOSIS — G6289 Other specified polyneuropathies: Secondary | ICD-10-CM | POA: Diagnosis not present

## 2022-03-07 DIAGNOSIS — R296 Repeated falls: Secondary | ICD-10-CM | POA: Diagnosis not present

## 2022-03-07 DIAGNOSIS — I10 Essential (primary) hypertension: Secondary | ICD-10-CM | POA: Diagnosis not present

## 2022-03-07 DIAGNOSIS — E1122 Type 2 diabetes mellitus with diabetic chronic kidney disease: Secondary | ICD-10-CM | POA: Diagnosis not present

## 2022-03-07 DIAGNOSIS — Z20822 Contact with and (suspected) exposure to covid-19: Secondary | ICD-10-CM | POA: Diagnosis not present

## 2022-03-10 DIAGNOSIS — E1122 Type 2 diabetes mellitus with diabetic chronic kidney disease: Secondary | ICD-10-CM | POA: Diagnosis not present

## 2022-03-10 DIAGNOSIS — R296 Repeated falls: Secondary | ICD-10-CM | POA: Diagnosis not present

## 2022-03-10 DIAGNOSIS — R269 Unspecified abnormalities of gait and mobility: Secondary | ICD-10-CM | POA: Diagnosis not present

## 2022-03-10 DIAGNOSIS — M810 Age-related osteoporosis without current pathological fracture: Secondary | ICD-10-CM | POA: Diagnosis not present

## 2022-03-10 DIAGNOSIS — I10 Essential (primary) hypertension: Secondary | ICD-10-CM | POA: Diagnosis not present

## 2022-03-10 DIAGNOSIS — G6289 Other specified polyneuropathies: Secondary | ICD-10-CM | POA: Diagnosis not present

## 2022-03-10 DIAGNOSIS — M6281 Muscle weakness (generalized): Secondary | ICD-10-CM | POA: Diagnosis not present

## 2022-03-11 DIAGNOSIS — M6281 Muscle weakness (generalized): Secondary | ICD-10-CM | POA: Diagnosis not present

## 2022-03-11 DIAGNOSIS — I10 Essential (primary) hypertension: Secondary | ICD-10-CM | POA: Diagnosis not present

## 2022-03-11 DIAGNOSIS — R296 Repeated falls: Secondary | ICD-10-CM | POA: Diagnosis not present

## 2022-03-11 DIAGNOSIS — G6289 Other specified polyneuropathies: Secondary | ICD-10-CM | POA: Diagnosis not present

## 2022-03-11 DIAGNOSIS — E1122 Type 2 diabetes mellitus with diabetic chronic kidney disease: Secondary | ICD-10-CM | POA: Diagnosis not present

## 2022-03-12 DIAGNOSIS — G6289 Other specified polyneuropathies: Secondary | ICD-10-CM | POA: Diagnosis not present

## 2022-03-12 DIAGNOSIS — M810 Age-related osteoporosis without current pathological fracture: Secondary | ICD-10-CM | POA: Diagnosis not present

## 2022-03-12 DIAGNOSIS — R296 Repeated falls: Secondary | ICD-10-CM | POA: Diagnosis not present

## 2022-03-12 DIAGNOSIS — M6281 Muscle weakness (generalized): Secondary | ICD-10-CM | POA: Diagnosis not present

## 2022-03-12 DIAGNOSIS — I10 Essential (primary) hypertension: Secondary | ICD-10-CM | POA: Diagnosis not present

## 2022-03-12 DIAGNOSIS — R269 Unspecified abnormalities of gait and mobility: Secondary | ICD-10-CM | POA: Diagnosis not present

## 2022-03-12 DIAGNOSIS — E1122 Type 2 diabetes mellitus with diabetic chronic kidney disease: Secondary | ICD-10-CM | POA: Diagnosis not present

## 2022-03-13 DIAGNOSIS — M6281 Muscle weakness (generalized): Secondary | ICD-10-CM | POA: Diagnosis not present

## 2022-03-13 DIAGNOSIS — Z20822 Contact with and (suspected) exposure to covid-19: Secondary | ICD-10-CM | POA: Diagnosis not present

## 2022-03-13 DIAGNOSIS — G6289 Other specified polyneuropathies: Secondary | ICD-10-CM | POA: Diagnosis not present

## 2022-03-13 DIAGNOSIS — R296 Repeated falls: Secondary | ICD-10-CM | POA: Diagnosis not present

## 2022-03-13 DIAGNOSIS — I10 Essential (primary) hypertension: Secondary | ICD-10-CM | POA: Diagnosis not present

## 2022-03-13 DIAGNOSIS — E1122 Type 2 diabetes mellitus with diabetic chronic kidney disease: Secondary | ICD-10-CM | POA: Diagnosis not present

## 2022-03-14 DIAGNOSIS — R269 Unspecified abnormalities of gait and mobility: Secondary | ICD-10-CM | POA: Diagnosis not present

## 2022-03-14 DIAGNOSIS — G6289 Other specified polyneuropathies: Secondary | ICD-10-CM | POA: Diagnosis not present

## 2022-03-14 DIAGNOSIS — M6281 Muscle weakness (generalized): Secondary | ICD-10-CM | POA: Diagnosis not present

## 2022-03-14 DIAGNOSIS — I10 Essential (primary) hypertension: Secondary | ICD-10-CM | POA: Diagnosis not present

## 2022-03-14 DIAGNOSIS — R296 Repeated falls: Secondary | ICD-10-CM | POA: Diagnosis not present

## 2022-03-14 DIAGNOSIS — E1122 Type 2 diabetes mellitus with diabetic chronic kidney disease: Secondary | ICD-10-CM | POA: Diagnosis not present

## 2022-03-14 DIAGNOSIS — M810 Age-related osteoporosis without current pathological fracture: Secondary | ICD-10-CM | POA: Diagnosis not present

## 2022-03-18 DIAGNOSIS — R296 Repeated falls: Secondary | ICD-10-CM | POA: Diagnosis not present

## 2022-03-18 DIAGNOSIS — G6289 Other specified polyneuropathies: Secondary | ICD-10-CM | POA: Diagnosis not present

## 2022-03-18 DIAGNOSIS — E1122 Type 2 diabetes mellitus with diabetic chronic kidney disease: Secondary | ICD-10-CM | POA: Diagnosis not present

## 2022-03-18 DIAGNOSIS — I10 Essential (primary) hypertension: Secondary | ICD-10-CM | POA: Diagnosis not present

## 2022-03-18 DIAGNOSIS — M6281 Muscle weakness (generalized): Secondary | ICD-10-CM | POA: Diagnosis not present

## 2022-03-18 DIAGNOSIS — R269 Unspecified abnormalities of gait and mobility: Secondary | ICD-10-CM | POA: Diagnosis not present

## 2022-03-18 DIAGNOSIS — M810 Age-related osteoporosis without current pathological fracture: Secondary | ICD-10-CM | POA: Diagnosis not present

## 2022-03-18 DIAGNOSIS — E538 Deficiency of other specified B group vitamins: Secondary | ICD-10-CM | POA: Diagnosis not present

## 2022-03-19 DIAGNOSIS — I10 Essential (primary) hypertension: Secondary | ICD-10-CM | POA: Diagnosis not present

## 2022-03-19 DIAGNOSIS — M810 Age-related osteoporosis without current pathological fracture: Secondary | ICD-10-CM | POA: Diagnosis not present

## 2022-03-19 DIAGNOSIS — M6281 Muscle weakness (generalized): Secondary | ICD-10-CM | POA: Diagnosis not present

## 2022-03-19 DIAGNOSIS — G6289 Other specified polyneuropathies: Secondary | ICD-10-CM | POA: Diagnosis not present

## 2022-03-19 DIAGNOSIS — R269 Unspecified abnormalities of gait and mobility: Secondary | ICD-10-CM | POA: Diagnosis not present

## 2022-03-19 DIAGNOSIS — E1122 Type 2 diabetes mellitus with diabetic chronic kidney disease: Secondary | ICD-10-CM | POA: Diagnosis not present

## 2022-03-19 DIAGNOSIS — R296 Repeated falls: Secondary | ICD-10-CM | POA: Diagnosis not present

## 2022-03-20 DIAGNOSIS — Z20822 Contact with and (suspected) exposure to covid-19: Secondary | ICD-10-CM | POA: Diagnosis not present

## 2022-03-20 DIAGNOSIS — M6281 Muscle weakness (generalized): Secondary | ICD-10-CM | POA: Diagnosis not present

## 2022-03-20 DIAGNOSIS — R296 Repeated falls: Secondary | ICD-10-CM | POA: Diagnosis not present

## 2022-03-20 DIAGNOSIS — G6289 Other specified polyneuropathies: Secondary | ICD-10-CM | POA: Diagnosis not present

## 2022-03-20 DIAGNOSIS — I10 Essential (primary) hypertension: Secondary | ICD-10-CM | POA: Diagnosis not present

## 2022-03-20 DIAGNOSIS — E1122 Type 2 diabetes mellitus with diabetic chronic kidney disease: Secondary | ICD-10-CM | POA: Diagnosis not present

## 2022-03-21 DIAGNOSIS — Z20822 Contact with and (suspected) exposure to covid-19: Secondary | ICD-10-CM | POA: Diagnosis not present

## 2022-03-21 DIAGNOSIS — M6281 Muscle weakness (generalized): Secondary | ICD-10-CM | POA: Diagnosis not present

## 2022-03-21 DIAGNOSIS — R269 Unspecified abnormalities of gait and mobility: Secondary | ICD-10-CM | POA: Diagnosis not present

## 2022-03-21 DIAGNOSIS — M810 Age-related osteoporosis without current pathological fracture: Secondary | ICD-10-CM | POA: Diagnosis not present

## 2022-03-21 DIAGNOSIS — G6289 Other specified polyneuropathies: Secondary | ICD-10-CM | POA: Diagnosis not present

## 2022-03-21 DIAGNOSIS — I10 Essential (primary) hypertension: Secondary | ICD-10-CM | POA: Diagnosis not present

## 2022-03-21 DIAGNOSIS — R296 Repeated falls: Secondary | ICD-10-CM | POA: Diagnosis not present

## 2022-03-21 DIAGNOSIS — E1122 Type 2 diabetes mellitus with diabetic chronic kidney disease: Secondary | ICD-10-CM | POA: Diagnosis not present

## 2022-03-25 DIAGNOSIS — E1122 Type 2 diabetes mellitus with diabetic chronic kidney disease: Secondary | ICD-10-CM | POA: Diagnosis not present

## 2022-03-25 DIAGNOSIS — M6281 Muscle weakness (generalized): Secondary | ICD-10-CM | POA: Diagnosis not present

## 2022-03-25 DIAGNOSIS — I10 Essential (primary) hypertension: Secondary | ICD-10-CM | POA: Diagnosis not present

## 2022-03-25 DIAGNOSIS — M810 Age-related osteoporosis without current pathological fracture: Secondary | ICD-10-CM | POA: Diagnosis not present

## 2022-03-25 DIAGNOSIS — R269 Unspecified abnormalities of gait and mobility: Secondary | ICD-10-CM | POA: Diagnosis not present

## 2022-03-25 DIAGNOSIS — R296 Repeated falls: Secondary | ICD-10-CM | POA: Diagnosis not present

## 2022-03-25 DIAGNOSIS — G6289 Other specified polyneuropathies: Secondary | ICD-10-CM | POA: Diagnosis not present

## 2022-03-26 DIAGNOSIS — R296 Repeated falls: Secondary | ICD-10-CM | POA: Diagnosis not present

## 2022-03-26 DIAGNOSIS — M6281 Muscle weakness (generalized): Secondary | ICD-10-CM | POA: Diagnosis not present

## 2022-03-26 DIAGNOSIS — G6289 Other specified polyneuropathies: Secondary | ICD-10-CM | POA: Diagnosis not present

## 2022-03-26 DIAGNOSIS — E1122 Type 2 diabetes mellitus with diabetic chronic kidney disease: Secondary | ICD-10-CM | POA: Diagnosis not present

## 2022-03-26 DIAGNOSIS — I10 Essential (primary) hypertension: Secondary | ICD-10-CM | POA: Diagnosis not present

## 2022-03-27 DIAGNOSIS — I10 Essential (primary) hypertension: Secondary | ICD-10-CM | POA: Diagnosis not present

## 2022-03-27 DIAGNOSIS — R296 Repeated falls: Secondary | ICD-10-CM | POA: Diagnosis not present

## 2022-03-27 DIAGNOSIS — R269 Unspecified abnormalities of gait and mobility: Secondary | ICD-10-CM | POA: Diagnosis not present

## 2022-03-27 DIAGNOSIS — G6289 Other specified polyneuropathies: Secondary | ICD-10-CM | POA: Diagnosis not present

## 2022-03-27 DIAGNOSIS — M6281 Muscle weakness (generalized): Secondary | ICD-10-CM | POA: Diagnosis not present

## 2022-03-27 DIAGNOSIS — E1122 Type 2 diabetes mellitus with diabetic chronic kidney disease: Secondary | ICD-10-CM | POA: Diagnosis not present

## 2022-03-27 DIAGNOSIS — M810 Age-related osteoporosis without current pathological fracture: Secondary | ICD-10-CM | POA: Diagnosis not present

## 2022-03-31 DIAGNOSIS — L851 Acquired keratosis [keratoderma] palmaris et plantaris: Secondary | ICD-10-CM | POA: Diagnosis not present

## 2022-03-31 DIAGNOSIS — E1140 Type 2 diabetes mellitus with diabetic neuropathy, unspecified: Secondary | ICD-10-CM | POA: Diagnosis not present

## 2022-03-31 DIAGNOSIS — B351 Tinea unguium: Secondary | ICD-10-CM | POA: Diagnosis not present

## 2022-03-31 DIAGNOSIS — M79675 Pain in left toe(s): Secondary | ICD-10-CM | POA: Diagnosis not present

## 2022-03-31 DIAGNOSIS — M79674 Pain in right toe(s): Secondary | ICD-10-CM | POA: Diagnosis not present

## 2022-04-01 DIAGNOSIS — R269 Unspecified abnormalities of gait and mobility: Secondary | ICD-10-CM | POA: Diagnosis not present

## 2022-04-01 DIAGNOSIS — E1122 Type 2 diabetes mellitus with diabetic chronic kidney disease: Secondary | ICD-10-CM | POA: Diagnosis not present

## 2022-04-01 DIAGNOSIS — M810 Age-related osteoporosis without current pathological fracture: Secondary | ICD-10-CM | POA: Diagnosis not present

## 2022-04-01 DIAGNOSIS — I10 Essential (primary) hypertension: Secondary | ICD-10-CM | POA: Diagnosis not present

## 2022-04-01 DIAGNOSIS — G6289 Other specified polyneuropathies: Secondary | ICD-10-CM | POA: Diagnosis not present

## 2022-04-01 DIAGNOSIS — M6281 Muscle weakness (generalized): Secondary | ICD-10-CM | POA: Diagnosis not present

## 2022-04-01 DIAGNOSIS — Z20822 Contact with and (suspected) exposure to covid-19: Secondary | ICD-10-CM | POA: Diagnosis not present

## 2022-04-01 DIAGNOSIS — R296 Repeated falls: Secondary | ICD-10-CM | POA: Diagnosis not present

## 2022-04-02 DIAGNOSIS — R059 Cough, unspecified: Secondary | ICD-10-CM | POA: Diagnosis not present

## 2022-04-02 DIAGNOSIS — R051 Acute cough: Secondary | ICD-10-CM | POA: Diagnosis not present

## 2022-04-02 DIAGNOSIS — Z20822 Contact with and (suspected) exposure to covid-19: Secondary | ICD-10-CM | POA: Diagnosis not present

## 2022-04-03 DIAGNOSIS — R296 Repeated falls: Secondary | ICD-10-CM | POA: Diagnosis not present

## 2022-04-03 DIAGNOSIS — M6281 Muscle weakness (generalized): Secondary | ICD-10-CM | POA: Diagnosis not present

## 2022-04-03 DIAGNOSIS — E1122 Type 2 diabetes mellitus with diabetic chronic kidney disease: Secondary | ICD-10-CM | POA: Diagnosis not present

## 2022-04-03 DIAGNOSIS — I10 Essential (primary) hypertension: Secondary | ICD-10-CM | POA: Diagnosis not present

## 2022-04-03 DIAGNOSIS — G6289 Other specified polyneuropathies: Secondary | ICD-10-CM | POA: Diagnosis not present

## 2022-04-04 DIAGNOSIS — E1122 Type 2 diabetes mellitus with diabetic chronic kidney disease: Secondary | ICD-10-CM | POA: Diagnosis not present

## 2022-04-04 DIAGNOSIS — M6281 Muscle weakness (generalized): Secondary | ICD-10-CM | POA: Diagnosis not present

## 2022-04-04 DIAGNOSIS — R296 Repeated falls: Secondary | ICD-10-CM | POA: Diagnosis not present

## 2022-04-04 DIAGNOSIS — M810 Age-related osteoporosis without current pathological fracture: Secondary | ICD-10-CM | POA: Diagnosis not present

## 2022-04-04 DIAGNOSIS — G6289 Other specified polyneuropathies: Secondary | ICD-10-CM | POA: Diagnosis not present

## 2022-04-04 DIAGNOSIS — R269 Unspecified abnormalities of gait and mobility: Secondary | ICD-10-CM | POA: Diagnosis not present

## 2022-04-04 DIAGNOSIS — I10 Essential (primary) hypertension: Secondary | ICD-10-CM | POA: Diagnosis not present

## 2022-04-07 DIAGNOSIS — Z20822 Contact with and (suspected) exposure to covid-19: Secondary | ICD-10-CM | POA: Diagnosis not present

## 2022-04-08 DIAGNOSIS — Z20822 Contact with and (suspected) exposure to covid-19: Secondary | ICD-10-CM | POA: Diagnosis not present

## 2022-04-08 DIAGNOSIS — R296 Repeated falls: Secondary | ICD-10-CM | POA: Diagnosis not present

## 2022-04-08 DIAGNOSIS — I10 Essential (primary) hypertension: Secondary | ICD-10-CM | POA: Diagnosis not present

## 2022-04-08 DIAGNOSIS — M6281 Muscle weakness (generalized): Secondary | ICD-10-CM | POA: Diagnosis not present

## 2022-04-08 DIAGNOSIS — G6289 Other specified polyneuropathies: Secondary | ICD-10-CM | POA: Diagnosis not present

## 2022-04-08 DIAGNOSIS — E1122 Type 2 diabetes mellitus with diabetic chronic kidney disease: Secondary | ICD-10-CM | POA: Diagnosis not present

## 2022-04-09 DIAGNOSIS — M810 Age-related osteoporosis without current pathological fracture: Secondary | ICD-10-CM | POA: Diagnosis not present

## 2022-04-09 DIAGNOSIS — I10 Essential (primary) hypertension: Secondary | ICD-10-CM | POA: Diagnosis not present

## 2022-04-09 DIAGNOSIS — G6289 Other specified polyneuropathies: Secondary | ICD-10-CM | POA: Diagnosis not present

## 2022-04-09 DIAGNOSIS — R269 Unspecified abnormalities of gait and mobility: Secondary | ICD-10-CM | POA: Diagnosis not present

## 2022-04-09 DIAGNOSIS — U071 COVID-19: Secondary | ICD-10-CM | POA: Diagnosis not present

## 2022-04-09 DIAGNOSIS — M6281 Muscle weakness (generalized): Secondary | ICD-10-CM | POA: Diagnosis not present

## 2022-04-09 DIAGNOSIS — E1122 Type 2 diabetes mellitus with diabetic chronic kidney disease: Secondary | ICD-10-CM | POA: Diagnosis not present

## 2022-04-09 DIAGNOSIS — R296 Repeated falls: Secondary | ICD-10-CM | POA: Diagnosis not present

## 2022-04-10 DIAGNOSIS — G6289 Other specified polyneuropathies: Secondary | ICD-10-CM | POA: Diagnosis not present

## 2022-04-10 DIAGNOSIS — M6281 Muscle weakness (generalized): Secondary | ICD-10-CM | POA: Diagnosis not present

## 2022-04-10 DIAGNOSIS — R296 Repeated falls: Secondary | ICD-10-CM | POA: Diagnosis not present

## 2022-04-10 DIAGNOSIS — E1122 Type 2 diabetes mellitus with diabetic chronic kidney disease: Secondary | ICD-10-CM | POA: Diagnosis not present

## 2022-04-10 DIAGNOSIS — I10 Essential (primary) hypertension: Secondary | ICD-10-CM | POA: Diagnosis not present

## 2022-04-11 DIAGNOSIS — G6289 Other specified polyneuropathies: Secondary | ICD-10-CM | POA: Diagnosis not present

## 2022-04-11 DIAGNOSIS — R296 Repeated falls: Secondary | ICD-10-CM | POA: Diagnosis not present

## 2022-04-11 DIAGNOSIS — E1122 Type 2 diabetes mellitus with diabetic chronic kidney disease: Secondary | ICD-10-CM | POA: Diagnosis not present

## 2022-04-11 DIAGNOSIS — R269 Unspecified abnormalities of gait and mobility: Secondary | ICD-10-CM | POA: Diagnosis not present

## 2022-04-11 DIAGNOSIS — I10 Essential (primary) hypertension: Secondary | ICD-10-CM | POA: Diagnosis not present

## 2022-04-11 DIAGNOSIS — M810 Age-related osteoporosis without current pathological fracture: Secondary | ICD-10-CM | POA: Diagnosis not present

## 2022-04-11 DIAGNOSIS — M6281 Muscle weakness (generalized): Secondary | ICD-10-CM | POA: Diagnosis not present

## 2022-04-11 DIAGNOSIS — Z20822 Contact with and (suspected) exposure to covid-19: Secondary | ICD-10-CM | POA: Diagnosis not present

## 2022-04-14 DIAGNOSIS — Z20822 Contact with and (suspected) exposure to covid-19: Secondary | ICD-10-CM | POA: Diagnosis not present

## 2022-04-15 DIAGNOSIS — R296 Repeated falls: Secondary | ICD-10-CM | POA: Diagnosis not present

## 2022-04-15 DIAGNOSIS — E538 Deficiency of other specified B group vitamins: Secondary | ICD-10-CM | POA: Diagnosis not present

## 2022-04-15 DIAGNOSIS — I10 Essential (primary) hypertension: Secondary | ICD-10-CM | POA: Diagnosis not present

## 2022-04-15 DIAGNOSIS — M6281 Muscle weakness (generalized): Secondary | ICD-10-CM | POA: Diagnosis not present

## 2022-04-15 DIAGNOSIS — G6289 Other specified polyneuropathies: Secondary | ICD-10-CM | POA: Diagnosis not present

## 2022-04-15 DIAGNOSIS — E1122 Type 2 diabetes mellitus with diabetic chronic kidney disease: Secondary | ICD-10-CM | POA: Diagnosis not present

## 2022-04-16 DIAGNOSIS — M810 Age-related osteoporosis without current pathological fracture: Secondary | ICD-10-CM | POA: Diagnosis not present

## 2022-04-16 DIAGNOSIS — M6281 Muscle weakness (generalized): Secondary | ICD-10-CM | POA: Diagnosis not present

## 2022-04-16 DIAGNOSIS — R269 Unspecified abnormalities of gait and mobility: Secondary | ICD-10-CM | POA: Diagnosis not present

## 2022-04-16 DIAGNOSIS — I10 Essential (primary) hypertension: Secondary | ICD-10-CM | POA: Diagnosis not present

## 2022-04-16 DIAGNOSIS — R296 Repeated falls: Secondary | ICD-10-CM | POA: Diagnosis not present

## 2022-04-16 DIAGNOSIS — G6289 Other specified polyneuropathies: Secondary | ICD-10-CM | POA: Diagnosis not present

## 2022-04-16 DIAGNOSIS — E1122 Type 2 diabetes mellitus with diabetic chronic kidney disease: Secondary | ICD-10-CM | POA: Diagnosis not present

## 2022-04-17 DIAGNOSIS — I10 Essential (primary) hypertension: Secondary | ICD-10-CM | POA: Diagnosis not present

## 2022-04-17 DIAGNOSIS — M6281 Muscle weakness (generalized): Secondary | ICD-10-CM | POA: Diagnosis not present

## 2022-04-17 DIAGNOSIS — R296 Repeated falls: Secondary | ICD-10-CM | POA: Diagnosis not present

## 2022-04-17 DIAGNOSIS — G6289 Other specified polyneuropathies: Secondary | ICD-10-CM | POA: Diagnosis not present

## 2022-04-17 DIAGNOSIS — E1122 Type 2 diabetes mellitus with diabetic chronic kidney disease: Secondary | ICD-10-CM | POA: Diagnosis not present

## 2022-04-22 DIAGNOSIS — E1122 Type 2 diabetes mellitus with diabetic chronic kidney disease: Secondary | ICD-10-CM | POA: Diagnosis not present

## 2022-04-22 DIAGNOSIS — R296 Repeated falls: Secondary | ICD-10-CM | POA: Diagnosis not present

## 2022-04-22 DIAGNOSIS — M6281 Muscle weakness (generalized): Secondary | ICD-10-CM | POA: Diagnosis not present

## 2022-04-22 DIAGNOSIS — I10 Essential (primary) hypertension: Secondary | ICD-10-CM | POA: Diagnosis not present

## 2022-04-22 DIAGNOSIS — G6289 Other specified polyneuropathies: Secondary | ICD-10-CM | POA: Diagnosis not present

## 2022-04-23 DIAGNOSIS — M6281 Muscle weakness (generalized): Secondary | ICD-10-CM | POA: Diagnosis not present

## 2022-04-23 DIAGNOSIS — I10 Essential (primary) hypertension: Secondary | ICD-10-CM | POA: Diagnosis not present

## 2022-04-23 DIAGNOSIS — E1122 Type 2 diabetes mellitus with diabetic chronic kidney disease: Secondary | ICD-10-CM | POA: Diagnosis not present

## 2022-04-23 DIAGNOSIS — R269 Unspecified abnormalities of gait and mobility: Secondary | ICD-10-CM | POA: Diagnosis not present

## 2022-04-23 DIAGNOSIS — R296 Repeated falls: Secondary | ICD-10-CM | POA: Diagnosis not present

## 2022-04-23 DIAGNOSIS — G6289 Other specified polyneuropathies: Secondary | ICD-10-CM | POA: Diagnosis not present

## 2022-04-23 DIAGNOSIS — M810 Age-related osteoporosis without current pathological fracture: Secondary | ICD-10-CM | POA: Diagnosis not present

## 2022-04-24 DIAGNOSIS — E1122 Type 2 diabetes mellitus with diabetic chronic kidney disease: Secondary | ICD-10-CM | POA: Diagnosis not present

## 2022-04-24 DIAGNOSIS — G6289 Other specified polyneuropathies: Secondary | ICD-10-CM | POA: Diagnosis not present

## 2022-04-24 DIAGNOSIS — I10 Essential (primary) hypertension: Secondary | ICD-10-CM | POA: Diagnosis not present

## 2022-04-24 DIAGNOSIS — R296 Repeated falls: Secondary | ICD-10-CM | POA: Diagnosis not present

## 2022-04-24 DIAGNOSIS — M6281 Muscle weakness (generalized): Secondary | ICD-10-CM | POA: Diagnosis not present

## 2022-04-25 DIAGNOSIS — M6281 Muscle weakness (generalized): Secondary | ICD-10-CM | POA: Diagnosis not present

## 2022-04-25 DIAGNOSIS — E1122 Type 2 diabetes mellitus with diabetic chronic kidney disease: Secondary | ICD-10-CM | POA: Diagnosis not present

## 2022-04-25 DIAGNOSIS — G6289 Other specified polyneuropathies: Secondary | ICD-10-CM | POA: Diagnosis not present

## 2022-04-25 DIAGNOSIS — M810 Age-related osteoporosis without current pathological fracture: Secondary | ICD-10-CM | POA: Diagnosis not present

## 2022-04-25 DIAGNOSIS — R269 Unspecified abnormalities of gait and mobility: Secondary | ICD-10-CM | POA: Diagnosis not present

## 2022-04-25 DIAGNOSIS — R296 Repeated falls: Secondary | ICD-10-CM | POA: Diagnosis not present

## 2022-04-25 DIAGNOSIS — I10 Essential (primary) hypertension: Secondary | ICD-10-CM | POA: Diagnosis not present

## 2022-04-29 DIAGNOSIS — I10 Essential (primary) hypertension: Secondary | ICD-10-CM | POA: Diagnosis not present

## 2022-04-29 DIAGNOSIS — M6281 Muscle weakness (generalized): Secondary | ICD-10-CM | POA: Diagnosis not present

## 2022-04-29 DIAGNOSIS — G6289 Other specified polyneuropathies: Secondary | ICD-10-CM | POA: Diagnosis not present

## 2022-04-29 DIAGNOSIS — E1122 Type 2 diabetes mellitus with diabetic chronic kidney disease: Secondary | ICD-10-CM | POA: Diagnosis not present

## 2022-04-29 DIAGNOSIS — R296 Repeated falls: Secondary | ICD-10-CM | POA: Diagnosis not present

## 2022-04-30 DIAGNOSIS — M810 Age-related osteoporosis without current pathological fracture: Secondary | ICD-10-CM | POA: Diagnosis not present

## 2022-04-30 DIAGNOSIS — I10 Essential (primary) hypertension: Secondary | ICD-10-CM | POA: Diagnosis not present

## 2022-04-30 DIAGNOSIS — M6281 Muscle weakness (generalized): Secondary | ICD-10-CM | POA: Diagnosis not present

## 2022-04-30 DIAGNOSIS — R296 Repeated falls: Secondary | ICD-10-CM | POA: Diagnosis not present

## 2022-04-30 DIAGNOSIS — E1122 Type 2 diabetes mellitus with diabetic chronic kidney disease: Secondary | ICD-10-CM | POA: Diagnosis not present

## 2022-04-30 DIAGNOSIS — R269 Unspecified abnormalities of gait and mobility: Secondary | ICD-10-CM | POA: Diagnosis not present

## 2022-04-30 DIAGNOSIS — G6289 Other specified polyneuropathies: Secondary | ICD-10-CM | POA: Diagnosis not present

## 2022-05-01 DIAGNOSIS — M6281 Muscle weakness (generalized): Secondary | ICD-10-CM | POA: Diagnosis not present

## 2022-05-01 DIAGNOSIS — R296 Repeated falls: Secondary | ICD-10-CM | POA: Diagnosis not present

## 2022-05-01 DIAGNOSIS — E1122 Type 2 diabetes mellitus with diabetic chronic kidney disease: Secondary | ICD-10-CM | POA: Diagnosis not present

## 2022-05-01 DIAGNOSIS — G6289 Other specified polyneuropathies: Secondary | ICD-10-CM | POA: Diagnosis not present

## 2022-05-01 DIAGNOSIS — I10 Essential (primary) hypertension: Secondary | ICD-10-CM | POA: Diagnosis not present

## 2022-05-06 DIAGNOSIS — E8881 Metabolic syndrome: Secondary | ICD-10-CM | POA: Diagnosis not present

## 2022-05-06 DIAGNOSIS — I10 Essential (primary) hypertension: Secondary | ICD-10-CM | POA: Diagnosis not present

## 2022-05-06 DIAGNOSIS — E1140 Type 2 diabetes mellitus with diabetic neuropathy, unspecified: Secondary | ICD-10-CM | POA: Diagnosis not present

## 2022-05-06 DIAGNOSIS — Z1212 Encounter for screening for malignant neoplasm of rectum: Secondary | ICD-10-CM | POA: Diagnosis not present

## 2022-05-06 DIAGNOSIS — R296 Repeated falls: Secondary | ICD-10-CM | POA: Diagnosis not present

## 2022-05-06 DIAGNOSIS — E782 Mixed hyperlipidemia: Secondary | ICD-10-CM | POA: Diagnosis not present

## 2022-05-06 DIAGNOSIS — E039 Hypothyroidism, unspecified: Secondary | ICD-10-CM | POA: Diagnosis not present

## 2022-05-06 DIAGNOSIS — G6289 Other specified polyneuropathies: Secondary | ICD-10-CM | POA: Diagnosis not present

## 2022-05-06 DIAGNOSIS — E1122 Type 2 diabetes mellitus with diabetic chronic kidney disease: Secondary | ICD-10-CM | POA: Diagnosis not present

## 2022-05-06 DIAGNOSIS — K7581 Nonalcoholic steatohepatitis (NASH): Secondary | ICD-10-CM | POA: Diagnosis not present

## 2022-05-06 DIAGNOSIS — M810 Age-related osteoporosis without current pathological fracture: Secondary | ICD-10-CM | POA: Diagnosis not present

## 2022-05-08 DIAGNOSIS — Z0001 Encounter for general adult medical examination with abnormal findings: Secondary | ICD-10-CM | POA: Diagnosis not present

## 2022-05-08 DIAGNOSIS — I10 Essential (primary) hypertension: Secondary | ICD-10-CM | POA: Diagnosis not present

## 2022-05-08 DIAGNOSIS — E559 Vitamin D deficiency, unspecified: Secondary | ICD-10-CM | POA: Diagnosis not present

## 2022-05-08 DIAGNOSIS — E11621 Type 2 diabetes mellitus with foot ulcer: Secondary | ICD-10-CM | POA: Diagnosis not present

## 2022-05-08 DIAGNOSIS — M810 Age-related osteoporosis without current pathological fracture: Secondary | ICD-10-CM | POA: Diagnosis not present

## 2022-05-08 DIAGNOSIS — R296 Repeated falls: Secondary | ICD-10-CM | POA: Diagnosis not present

## 2022-05-08 DIAGNOSIS — K7581 Nonalcoholic steatohepatitis (NASH): Secondary | ICD-10-CM | POA: Diagnosis not present

## 2022-05-08 DIAGNOSIS — E538 Deficiency of other specified B group vitamins: Secondary | ICD-10-CM | POA: Diagnosis not present

## 2022-05-08 DIAGNOSIS — E8881 Metabolic syndrome: Secondary | ICD-10-CM | POA: Diagnosis not present

## 2022-05-08 DIAGNOSIS — E1122 Type 2 diabetes mellitus with diabetic chronic kidney disease: Secondary | ICD-10-CM | POA: Diagnosis not present

## 2022-05-08 DIAGNOSIS — E1140 Type 2 diabetes mellitus with diabetic neuropathy, unspecified: Secondary | ICD-10-CM | POA: Diagnosis not present

## 2022-05-08 DIAGNOSIS — R4582 Worries: Secondary | ICD-10-CM | POA: Diagnosis not present

## 2022-07-04 DIAGNOSIS — M170 Bilateral primary osteoarthritis of knee: Secondary | ICD-10-CM | POA: Diagnosis not present

## 2022-07-08 ENCOUNTER — Telehealth: Payer: Self-pay | Admitting: Orthopedic Surgery

## 2022-07-08 NOTE — Telephone Encounter (Signed)
Patient called and left a voicemail at 3:44 pm wants to make an appt with Dr. Aline Brochure ONLY.  She left her name, DOB, and phone number that is it, she did not state why she needed an appointment with Dr. Aline Brochure.   Called patient back unable to leave a voicemail.   Will need to discuss any previous orthopedic history.

## 2022-07-22 DIAGNOSIS — Z7984 Long term (current) use of oral hypoglycemic drugs: Secondary | ICD-10-CM | POA: Diagnosis not present

## 2022-07-22 DIAGNOSIS — H2513 Age-related nuclear cataract, bilateral: Secondary | ICD-10-CM | POA: Diagnosis not present

## 2022-07-22 DIAGNOSIS — Z794 Long term (current) use of insulin: Secondary | ICD-10-CM | POA: Diagnosis not present

## 2022-07-22 DIAGNOSIS — E119 Type 2 diabetes mellitus without complications: Secondary | ICD-10-CM | POA: Diagnosis not present

## 2022-08-01 ENCOUNTER — Telehealth: Payer: Self-pay | Admitting: *Deleted

## 2022-08-01 ENCOUNTER — Encounter: Payer: Self-pay | Admitting: *Deleted

## 2022-08-01 DIAGNOSIS — M6281 Muscle weakness (generalized): Secondary | ICD-10-CM | POA: Diagnosis not present

## 2022-08-01 DIAGNOSIS — R296 Repeated falls: Secondary | ICD-10-CM | POA: Diagnosis not present

## 2022-08-01 DIAGNOSIS — E1122 Type 2 diabetes mellitus with diabetic chronic kidney disease: Secondary | ICD-10-CM | POA: Diagnosis not present

## 2022-08-01 DIAGNOSIS — I10 Essential (primary) hypertension: Secondary | ICD-10-CM | POA: Diagnosis not present

## 2022-08-01 DIAGNOSIS — R262 Difficulty in walking, not elsewhere classified: Secondary | ICD-10-CM | POA: Diagnosis not present

## 2022-08-01 NOTE — Patient Outreach (Signed)
  Care Coordination   08/01/2022 Name: Kathryn Ryan MRN: [id number] DOB: 1941/07/14   Care Coordination Outreach Attempts:  An unsuccessful telephone outreach was attempted today to offer the patient information about available care coordination services as a benefit of their health plan.   Follow Up Plan:  Additional outreach attempts will be made to offer the patient care coordination information and services.   Encounter Outcome:  No Answer  Care Coordination Interventions Activated:  No   Care Coordination Interventions:  No, not indicated    Chong Sicilian, BSN, RN-BC RN Care Coordinator Direct Dial: [phone]

## 2022-08-06 DIAGNOSIS — R2681 Unsteadiness on feet: Secondary | ICD-10-CM | POA: Diagnosis not present

## 2022-08-06 DIAGNOSIS — N183 Chronic kidney disease, stage 3 unspecified: Secondary | ICD-10-CM | POA: Diagnosis not present

## 2022-08-06 DIAGNOSIS — E1122 Type 2 diabetes mellitus with diabetic chronic kidney disease: Secondary | ICD-10-CM | POA: Diagnosis not present

## 2022-08-06 DIAGNOSIS — R296 Repeated falls: Secondary | ICD-10-CM | POA: Diagnosis not present

## 2022-08-06 DIAGNOSIS — R269 Unspecified abnormalities of gait and mobility: Secondary | ICD-10-CM | POA: Diagnosis not present

## 2022-08-06 DIAGNOSIS — R531 Weakness: Secondary | ICD-10-CM | POA: Diagnosis not present

## 2022-08-06 DIAGNOSIS — R2689 Other abnormalities of gait and mobility: Secondary | ICD-10-CM | POA: Diagnosis not present

## 2022-08-06 DIAGNOSIS — I10 Essential (primary) hypertension: Secondary | ICD-10-CM | POA: Diagnosis not present

## 2022-08-07 DIAGNOSIS — H25811 Combined forms of age-related cataract, right eye: Secondary | ICD-10-CM | POA: Diagnosis not present

## 2022-08-07 DIAGNOSIS — H25812 Combined forms of age-related cataract, left eye: Secondary | ICD-10-CM | POA: Diagnosis not present

## 2022-08-08 DIAGNOSIS — R2681 Unsteadiness on feet: Secondary | ICD-10-CM | POA: Diagnosis not present

## 2022-08-08 DIAGNOSIS — I10 Essential (primary) hypertension: Secondary | ICD-10-CM | POA: Diagnosis not present

## 2022-08-08 DIAGNOSIS — N183 Chronic kidney disease, stage 3 unspecified: Secondary | ICD-10-CM | POA: Diagnosis not present

## 2022-08-08 DIAGNOSIS — E1122 Type 2 diabetes mellitus with diabetic chronic kidney disease: Secondary | ICD-10-CM | POA: Diagnosis not present

## 2022-08-08 DIAGNOSIS — R262 Difficulty in walking, not elsewhere classified: Secondary | ICD-10-CM | POA: Diagnosis not present

## 2022-08-08 DIAGNOSIS — R531 Weakness: Secondary | ICD-10-CM | POA: Diagnosis not present

## 2022-08-08 DIAGNOSIS — R2689 Other abnormalities of gait and mobility: Secondary | ICD-10-CM | POA: Diagnosis not present

## 2022-08-08 DIAGNOSIS — R296 Repeated falls: Secondary | ICD-10-CM | POA: Diagnosis not present

## 2022-08-08 DIAGNOSIS — M6281 Muscle weakness (generalized): Secondary | ICD-10-CM | POA: Diagnosis not present

## 2022-08-08 DIAGNOSIS — R269 Unspecified abnormalities of gait and mobility: Secondary | ICD-10-CM | POA: Diagnosis not present

## 2022-08-12 DIAGNOSIS — E1122 Type 2 diabetes mellitus with diabetic chronic kidney disease: Secondary | ICD-10-CM | POA: Diagnosis not present

## 2022-08-12 DIAGNOSIS — R531 Weakness: Secondary | ICD-10-CM | POA: Diagnosis not present

## 2022-08-12 DIAGNOSIS — I10 Essential (primary) hypertension: Secondary | ICD-10-CM | POA: Diagnosis not present

## 2022-08-12 DIAGNOSIS — R2689 Other abnormalities of gait and mobility: Secondary | ICD-10-CM | POA: Diagnosis not present

## 2022-08-12 DIAGNOSIS — N183 Chronic kidney disease, stage 3 unspecified: Secondary | ICD-10-CM | POA: Diagnosis not present

## 2022-08-12 DIAGNOSIS — R296 Repeated falls: Secondary | ICD-10-CM | POA: Diagnosis not present

## 2022-08-12 DIAGNOSIS — R2681 Unsteadiness on feet: Secondary | ICD-10-CM | POA: Diagnosis not present

## 2022-08-12 DIAGNOSIS — R269 Unspecified abnormalities of gait and mobility: Secondary | ICD-10-CM | POA: Diagnosis not present

## 2022-08-15 DIAGNOSIS — R2681 Unsteadiness on feet: Secondary | ICD-10-CM | POA: Diagnosis not present

## 2022-08-15 DIAGNOSIS — E1122 Type 2 diabetes mellitus with diabetic chronic kidney disease: Secondary | ICD-10-CM | POA: Diagnosis not present

## 2022-08-15 DIAGNOSIS — I10 Essential (primary) hypertension: Secondary | ICD-10-CM | POA: Diagnosis not present

## 2022-08-15 DIAGNOSIS — R2689 Other abnormalities of gait and mobility: Secondary | ICD-10-CM | POA: Diagnosis not present

## 2022-08-15 DIAGNOSIS — N183 Chronic kidney disease, stage 3 unspecified: Secondary | ICD-10-CM | POA: Diagnosis not present

## 2022-08-15 DIAGNOSIS — R531 Weakness: Secondary | ICD-10-CM | POA: Diagnosis not present

## 2022-08-15 DIAGNOSIS — R296 Repeated falls: Secondary | ICD-10-CM | POA: Diagnosis not present

## 2022-08-15 DIAGNOSIS — R269 Unspecified abnormalities of gait and mobility: Secondary | ICD-10-CM | POA: Diagnosis not present

## 2022-08-18 DIAGNOSIS — I10 Essential (primary) hypertension: Secondary | ICD-10-CM | POA: Diagnosis not present

## 2022-08-18 DIAGNOSIS — R531 Weakness: Secondary | ICD-10-CM | POA: Diagnosis not present

## 2022-08-18 DIAGNOSIS — R296 Repeated falls: Secondary | ICD-10-CM | POA: Diagnosis not present

## 2022-08-18 DIAGNOSIS — R269 Unspecified abnormalities of gait and mobility: Secondary | ICD-10-CM | POA: Diagnosis not present

## 2022-08-18 DIAGNOSIS — R2689 Other abnormalities of gait and mobility: Secondary | ICD-10-CM | POA: Diagnosis not present

## 2022-08-18 DIAGNOSIS — R2681 Unsteadiness on feet: Secondary | ICD-10-CM | POA: Diagnosis not present

## 2022-08-18 DIAGNOSIS — R262 Difficulty in walking, not elsewhere classified: Secondary | ICD-10-CM | POA: Diagnosis not present

## 2022-08-18 DIAGNOSIS — E1122 Type 2 diabetes mellitus with diabetic chronic kidney disease: Secondary | ICD-10-CM | POA: Diagnosis not present

## 2022-08-18 DIAGNOSIS — M6281 Muscle weakness (generalized): Secondary | ICD-10-CM | POA: Diagnosis not present

## 2022-08-18 DIAGNOSIS — N183 Chronic kidney disease, stage 3 unspecified: Secondary | ICD-10-CM | POA: Diagnosis not present

## 2022-08-19 ENCOUNTER — Telehealth: Payer: Self-pay | Admitting: *Deleted

## 2022-08-19 DIAGNOSIS — R531 Weakness: Secondary | ICD-10-CM | POA: Diagnosis not present

## 2022-08-19 DIAGNOSIS — R269 Unspecified abnormalities of gait and mobility: Secondary | ICD-10-CM | POA: Diagnosis not present

## 2022-08-19 DIAGNOSIS — R2689 Other abnormalities of gait and mobility: Secondary | ICD-10-CM | POA: Diagnosis not present

## 2022-08-19 DIAGNOSIS — I10 Essential (primary) hypertension: Secondary | ICD-10-CM | POA: Diagnosis not present

## 2022-08-19 DIAGNOSIS — R2681 Unsteadiness on feet: Secondary | ICD-10-CM | POA: Diagnosis not present

## 2022-08-19 DIAGNOSIS — E11621 Type 2 diabetes mellitus with foot ulcer: Secondary | ICD-10-CM | POA: Diagnosis not present

## 2022-08-19 DIAGNOSIS — N183 Chronic kidney disease, stage 3 unspecified: Secondary | ICD-10-CM | POA: Diagnosis not present

## 2022-08-19 DIAGNOSIS — E1140 Type 2 diabetes mellitus with diabetic neuropathy, unspecified: Secondary | ICD-10-CM | POA: Diagnosis not present

## 2022-08-19 DIAGNOSIS — E8881 Metabolic syndrome: Secondary | ICD-10-CM | POA: Diagnosis not present

## 2022-08-19 DIAGNOSIS — R4582 Worries: Secondary | ICD-10-CM | POA: Diagnosis not present

## 2022-08-19 DIAGNOSIS — R059 Cough, unspecified: Secondary | ICD-10-CM | POA: Diagnosis not present

## 2022-08-19 DIAGNOSIS — Z23 Encounter for immunization: Secondary | ICD-10-CM | POA: Diagnosis not present

## 2022-08-19 DIAGNOSIS — Z20828 Contact with and (suspected) exposure to other viral communicable diseases: Secondary | ICD-10-CM | POA: Diagnosis not present

## 2022-08-19 DIAGNOSIS — E1122 Type 2 diabetes mellitus with diabetic chronic kidney disease: Secondary | ICD-10-CM | POA: Diagnosis not present

## 2022-08-19 DIAGNOSIS — E538 Deficiency of other specified B group vitamins: Secondary | ICD-10-CM | POA: Diagnosis not present

## 2022-08-19 DIAGNOSIS — R296 Repeated falls: Secondary | ICD-10-CM | POA: Diagnosis not present

## 2022-08-19 DIAGNOSIS — E559 Vitamin D deficiency, unspecified: Secondary | ICD-10-CM | POA: Diagnosis not present

## 2022-08-19 DIAGNOSIS — E7849 Other hyperlipidemia: Secondary | ICD-10-CM | POA: Diagnosis not present

## 2022-08-19 NOTE — Patient Outreach (Signed)
  Care Coordination   08/19/2022 Name: Kathryn Ryan MRN: [id number] DOB: 02-05-1941   Care Coordination Outreach Attempts:  A second unsuccessful outreach was attempted today to offer the patient with information about available care coordination services as a benefit of their health plan.     Follow Up Plan:  Additional outreach attempts will be made to offer the patient care coordination information and services.   Encounter Outcome:  No Answer  Care Coordination Interventions Activated:  No   Care Coordination Interventions:  No, not indicated    Chong Sicilian, BSN, RN-BC RN Care Coordinator [place]: [phone] Main #: [phone]

## 2022-08-22 DIAGNOSIS — R531 Weakness: Secondary | ICD-10-CM | POA: Diagnosis not present

## 2022-08-22 DIAGNOSIS — R269 Unspecified abnormalities of gait and mobility: Secondary | ICD-10-CM | POA: Diagnosis not present

## 2022-08-22 DIAGNOSIS — N183 Chronic kidney disease, stage 3 unspecified: Secondary | ICD-10-CM | POA: Diagnosis not present

## 2022-08-22 DIAGNOSIS — E1122 Type 2 diabetes mellitus with diabetic chronic kidney disease: Secondary | ICD-10-CM | POA: Diagnosis not present

## 2022-08-22 DIAGNOSIS — I10 Essential (primary) hypertension: Secondary | ICD-10-CM | POA: Diagnosis not present

## 2022-08-22 DIAGNOSIS — R2681 Unsteadiness on feet: Secondary | ICD-10-CM | POA: Diagnosis not present

## 2022-08-22 DIAGNOSIS — R296 Repeated falls: Secondary | ICD-10-CM | POA: Diagnosis not present

## 2022-08-22 DIAGNOSIS — R2689 Other abnormalities of gait and mobility: Secondary | ICD-10-CM | POA: Diagnosis not present

## 2022-08-25 DIAGNOSIS — E1122 Type 2 diabetes mellitus with diabetic chronic kidney disease: Secondary | ICD-10-CM | POA: Diagnosis not present

## 2022-08-25 DIAGNOSIS — N183 Chronic kidney disease, stage 3 unspecified: Secondary | ICD-10-CM | POA: Diagnosis not present

## 2022-08-25 DIAGNOSIS — R296 Repeated falls: Secondary | ICD-10-CM | POA: Diagnosis not present

## 2022-08-25 DIAGNOSIS — R2689 Other abnormalities of gait and mobility: Secondary | ICD-10-CM | POA: Diagnosis not present

## 2022-08-25 DIAGNOSIS — I10 Essential (primary) hypertension: Secondary | ICD-10-CM | POA: Diagnosis not present

## 2022-08-25 DIAGNOSIS — R269 Unspecified abnormalities of gait and mobility: Secondary | ICD-10-CM | POA: Diagnosis not present

## 2022-08-25 DIAGNOSIS — R2681 Unsteadiness on feet: Secondary | ICD-10-CM | POA: Diagnosis not present

## 2022-08-25 DIAGNOSIS — R531 Weakness: Secondary | ICD-10-CM | POA: Diagnosis not present

## 2022-08-26 DIAGNOSIS — R531 Weakness: Secondary | ICD-10-CM | POA: Diagnosis not present

## 2022-08-26 DIAGNOSIS — R269 Unspecified abnormalities of gait and mobility: Secondary | ICD-10-CM | POA: Diagnosis not present

## 2022-08-26 DIAGNOSIS — R2681 Unsteadiness on feet: Secondary | ICD-10-CM | POA: Diagnosis not present

## 2022-08-26 DIAGNOSIS — E1122 Type 2 diabetes mellitus with diabetic chronic kidney disease: Secondary | ICD-10-CM | POA: Diagnosis not present

## 2022-08-26 DIAGNOSIS — N183 Chronic kidney disease, stage 3 unspecified: Secondary | ICD-10-CM | POA: Diagnosis not present

## 2022-08-26 DIAGNOSIS — R296 Repeated falls: Secondary | ICD-10-CM | POA: Diagnosis not present

## 2022-08-26 DIAGNOSIS — R2689 Other abnormalities of gait and mobility: Secondary | ICD-10-CM | POA: Diagnosis not present

## 2022-08-26 DIAGNOSIS — I10 Essential (primary) hypertension: Secondary | ICD-10-CM | POA: Diagnosis not present

## 2022-08-27 DIAGNOSIS — I10 Essential (primary) hypertension: Secondary | ICD-10-CM | POA: Diagnosis not present

## 2022-08-27 DIAGNOSIS — R262 Difficulty in walking, not elsewhere classified: Secondary | ICD-10-CM | POA: Diagnosis not present

## 2022-08-27 DIAGNOSIS — R296 Repeated falls: Secondary | ICD-10-CM | POA: Diagnosis not present

## 2022-08-27 DIAGNOSIS — M6281 Muscle weakness (generalized): Secondary | ICD-10-CM | POA: Diagnosis not present

## 2022-08-27 DIAGNOSIS — E1122 Type 2 diabetes mellitus with diabetic chronic kidney disease: Secondary | ICD-10-CM | POA: Diagnosis not present

## 2022-08-29 DIAGNOSIS — N183 Chronic kidney disease, stage 3 unspecified: Secondary | ICD-10-CM | POA: Diagnosis not present

## 2022-08-29 DIAGNOSIS — R269 Unspecified abnormalities of gait and mobility: Secondary | ICD-10-CM | POA: Diagnosis not present

## 2022-08-29 DIAGNOSIS — E1122 Type 2 diabetes mellitus with diabetic chronic kidney disease: Secondary | ICD-10-CM | POA: Diagnosis not present

## 2022-08-29 DIAGNOSIS — R531 Weakness: Secondary | ICD-10-CM | POA: Diagnosis not present

## 2022-08-29 DIAGNOSIS — R2681 Unsteadiness on feet: Secondary | ICD-10-CM | POA: Diagnosis not present

## 2022-08-29 DIAGNOSIS — R2689 Other abnormalities of gait and mobility: Secondary | ICD-10-CM | POA: Diagnosis not present

## 2022-08-29 DIAGNOSIS — I10 Essential (primary) hypertension: Secondary | ICD-10-CM | POA: Diagnosis not present

## 2022-08-29 DIAGNOSIS — R296 Repeated falls: Secondary | ICD-10-CM | POA: Diagnosis not present

## 2022-09-01 DIAGNOSIS — M6281 Muscle weakness (generalized): Secondary | ICD-10-CM | POA: Diagnosis not present

## 2022-09-01 DIAGNOSIS — R2681 Unsteadiness on feet: Secondary | ICD-10-CM | POA: Diagnosis not present

## 2022-09-01 DIAGNOSIS — R269 Unspecified abnormalities of gait and mobility: Secondary | ICD-10-CM | POA: Diagnosis not present

## 2022-09-01 DIAGNOSIS — R2689 Other abnormalities of gait and mobility: Secondary | ICD-10-CM | POA: Diagnosis not present

## 2022-09-01 DIAGNOSIS — E1122 Type 2 diabetes mellitus with diabetic chronic kidney disease: Secondary | ICD-10-CM | POA: Diagnosis not present

## 2022-09-01 DIAGNOSIS — N183 Chronic kidney disease, stage 3 unspecified: Secondary | ICD-10-CM | POA: Diagnosis not present

## 2022-09-01 DIAGNOSIS — I10 Essential (primary) hypertension: Secondary | ICD-10-CM | POA: Diagnosis not present

## 2022-09-01 DIAGNOSIS — R262 Difficulty in walking, not elsewhere classified: Secondary | ICD-10-CM | POA: Diagnosis not present

## 2022-09-01 DIAGNOSIS — R296 Repeated falls: Secondary | ICD-10-CM | POA: Diagnosis not present

## 2022-09-01 DIAGNOSIS — R531 Weakness: Secondary | ICD-10-CM | POA: Diagnosis not present

## 2022-09-02 DIAGNOSIS — R531 Weakness: Secondary | ICD-10-CM | POA: Diagnosis not present

## 2022-09-02 DIAGNOSIS — E1122 Type 2 diabetes mellitus with diabetic chronic kidney disease: Secondary | ICD-10-CM | POA: Diagnosis not present

## 2022-09-02 DIAGNOSIS — R296 Repeated falls: Secondary | ICD-10-CM | POA: Diagnosis not present

## 2022-09-02 DIAGNOSIS — R269 Unspecified abnormalities of gait and mobility: Secondary | ICD-10-CM | POA: Diagnosis not present

## 2022-09-02 DIAGNOSIS — I10 Essential (primary) hypertension: Secondary | ICD-10-CM | POA: Diagnosis not present

## 2022-09-02 DIAGNOSIS — R2689 Other abnormalities of gait and mobility: Secondary | ICD-10-CM | POA: Diagnosis not present

## 2022-09-02 DIAGNOSIS — R2681 Unsteadiness on feet: Secondary | ICD-10-CM | POA: Diagnosis not present

## 2022-09-02 DIAGNOSIS — N183 Chronic kidney disease, stage 3 unspecified: Secondary | ICD-10-CM | POA: Diagnosis not present

## 2022-09-05 ENCOUNTER — Telehealth: Payer: Self-pay | Admitting: *Deleted

## 2022-09-05 DIAGNOSIS — I10 Essential (primary) hypertension: Secondary | ICD-10-CM | POA: Diagnosis not present

## 2022-09-05 DIAGNOSIS — R531 Weakness: Secondary | ICD-10-CM | POA: Diagnosis not present

## 2022-09-05 DIAGNOSIS — R296 Repeated falls: Secondary | ICD-10-CM | POA: Diagnosis not present

## 2022-09-05 DIAGNOSIS — R2681 Unsteadiness on feet: Secondary | ICD-10-CM | POA: Diagnosis not present

## 2022-09-05 DIAGNOSIS — N183 Chronic kidney disease, stage 3 unspecified: Secondary | ICD-10-CM | POA: Diagnosis not present

## 2022-09-05 DIAGNOSIS — R269 Unspecified abnormalities of gait and mobility: Secondary | ICD-10-CM | POA: Diagnosis not present

## 2022-09-05 DIAGNOSIS — R2689 Other abnormalities of gait and mobility: Secondary | ICD-10-CM | POA: Diagnosis not present

## 2022-09-05 DIAGNOSIS — E1122 Type 2 diabetes mellitus with diabetic chronic kidney disease: Secondary | ICD-10-CM | POA: Diagnosis not present

## 2022-09-05 NOTE — Patient Outreach (Signed)
  Care Coordination   09/05/2022 Name: Kathryn Ryan MRN: [id number] DOB: 1941-10-27   Care Coordination Outreach Attempts:  A third unsuccessful outreach was attempted today to offer the patient with information about available care coordination services as a benefit of their health plan.   Follow Up Plan:  No further outreach attempts will be made at this time. We have been unable to contact the patient to offer or enroll patient in care coordination services  Encounter Outcome:  No Answer  Care Coordination Interventions Activated:  No   Care Coordination Interventions:  No, not indicated    Valente David, RN, MSN, [facility] Care Management Care Management Coordinator [phone]

## 2022-09-08 DIAGNOSIS — R296 Repeated falls: Secondary | ICD-10-CM | POA: Diagnosis not present

## 2022-09-08 DIAGNOSIS — N183 Chronic kidney disease, stage 3 unspecified: Secondary | ICD-10-CM | POA: Diagnosis not present

## 2022-09-08 DIAGNOSIS — E1122 Type 2 diabetes mellitus with diabetic chronic kidney disease: Secondary | ICD-10-CM | POA: Diagnosis not present

## 2022-09-08 DIAGNOSIS — R2681 Unsteadiness on feet: Secondary | ICD-10-CM | POA: Diagnosis not present

## 2022-09-08 DIAGNOSIS — M6281 Muscle weakness (generalized): Secondary | ICD-10-CM | POA: Diagnosis not present

## 2022-09-08 DIAGNOSIS — R262 Difficulty in walking, not elsewhere classified: Secondary | ICD-10-CM | POA: Diagnosis not present

## 2022-09-08 DIAGNOSIS — R531 Weakness: Secondary | ICD-10-CM | POA: Diagnosis not present

## 2022-09-08 DIAGNOSIS — R2689 Other abnormalities of gait and mobility: Secondary | ICD-10-CM | POA: Diagnosis not present

## 2022-09-08 DIAGNOSIS — R269 Unspecified abnormalities of gait and mobility: Secondary | ICD-10-CM | POA: Diagnosis not present

## 2022-09-08 DIAGNOSIS — I10 Essential (primary) hypertension: Secondary | ICD-10-CM | POA: Diagnosis not present

## 2022-09-09 DIAGNOSIS — R296 Repeated falls: Secondary | ICD-10-CM | POA: Diagnosis not present

## 2022-09-09 DIAGNOSIS — E1122 Type 2 diabetes mellitus with diabetic chronic kidney disease: Secondary | ICD-10-CM | POA: Diagnosis not present

## 2022-09-09 DIAGNOSIS — R531 Weakness: Secondary | ICD-10-CM | POA: Diagnosis not present

## 2022-09-09 DIAGNOSIS — R2681 Unsteadiness on feet: Secondary | ICD-10-CM | POA: Diagnosis not present

## 2022-09-09 DIAGNOSIS — R269 Unspecified abnormalities of gait and mobility: Secondary | ICD-10-CM | POA: Diagnosis not present

## 2022-09-09 DIAGNOSIS — R2689 Other abnormalities of gait and mobility: Secondary | ICD-10-CM | POA: Diagnosis not present

## 2022-09-09 DIAGNOSIS — N183 Chronic kidney disease, stage 3 unspecified: Secondary | ICD-10-CM | POA: Diagnosis not present

## 2022-09-09 DIAGNOSIS — I10 Essential (primary) hypertension: Secondary | ICD-10-CM | POA: Diagnosis not present

## 2022-09-11 DIAGNOSIS — H25812 Combined forms of age-related cataract, left eye: Secondary | ICD-10-CM | POA: Diagnosis not present

## 2022-09-11 DIAGNOSIS — H269 Unspecified cataract: Secondary | ICD-10-CM | POA: Diagnosis not present

## 2022-09-12 DIAGNOSIS — R269 Unspecified abnormalities of gait and mobility: Secondary | ICD-10-CM | POA: Diagnosis not present

## 2022-09-12 DIAGNOSIS — E1122 Type 2 diabetes mellitus with diabetic chronic kidney disease: Secondary | ICD-10-CM | POA: Diagnosis not present

## 2022-09-12 DIAGNOSIS — R2681 Unsteadiness on feet: Secondary | ICD-10-CM | POA: Diagnosis not present

## 2022-09-12 DIAGNOSIS — R296 Repeated falls: Secondary | ICD-10-CM | POA: Diagnosis not present

## 2022-09-12 DIAGNOSIS — N183 Chronic kidney disease, stage 3 unspecified: Secondary | ICD-10-CM | POA: Diagnosis not present

## 2022-09-12 DIAGNOSIS — I10 Essential (primary) hypertension: Secondary | ICD-10-CM | POA: Diagnosis not present

## 2022-09-12 DIAGNOSIS — R2689 Other abnormalities of gait and mobility: Secondary | ICD-10-CM | POA: Diagnosis not present

## 2022-09-12 DIAGNOSIS — R531 Weakness: Secondary | ICD-10-CM | POA: Diagnosis not present

## 2022-09-15 DIAGNOSIS — R296 Repeated falls: Secondary | ICD-10-CM | POA: Diagnosis not present

## 2022-09-15 DIAGNOSIS — R531 Weakness: Secondary | ICD-10-CM | POA: Diagnosis not present

## 2022-09-15 DIAGNOSIS — E1122 Type 2 diabetes mellitus with diabetic chronic kidney disease: Secondary | ICD-10-CM | POA: Diagnosis not present

## 2022-09-15 DIAGNOSIS — R269 Unspecified abnormalities of gait and mobility: Secondary | ICD-10-CM | POA: Diagnosis not present

## 2022-09-15 DIAGNOSIS — R2689 Other abnormalities of gait and mobility: Secondary | ICD-10-CM | POA: Diagnosis not present

## 2022-09-15 DIAGNOSIS — R262 Difficulty in walking, not elsewhere classified: Secondary | ICD-10-CM | POA: Diagnosis not present

## 2022-09-15 DIAGNOSIS — N183 Chronic kidney disease, stage 3 unspecified: Secondary | ICD-10-CM | POA: Diagnosis not present

## 2022-09-15 DIAGNOSIS — M6281 Muscle weakness (generalized): Secondary | ICD-10-CM | POA: Diagnosis not present

## 2022-09-15 DIAGNOSIS — R2681 Unsteadiness on feet: Secondary | ICD-10-CM | POA: Diagnosis not present

## 2022-09-15 DIAGNOSIS — I10 Essential (primary) hypertension: Secondary | ICD-10-CM | POA: Diagnosis not present

## 2022-09-16 DIAGNOSIS — R531 Weakness: Secondary | ICD-10-CM | POA: Diagnosis not present

## 2022-09-16 DIAGNOSIS — R269 Unspecified abnormalities of gait and mobility: Secondary | ICD-10-CM | POA: Diagnosis not present

## 2022-09-16 DIAGNOSIS — R2689 Other abnormalities of gait and mobility: Secondary | ICD-10-CM | POA: Diagnosis not present

## 2022-09-16 DIAGNOSIS — E1122 Type 2 diabetes mellitus with diabetic chronic kidney disease: Secondary | ICD-10-CM | POA: Diagnosis not present

## 2022-09-16 DIAGNOSIS — N183 Chronic kidney disease, stage 3 unspecified: Secondary | ICD-10-CM | POA: Diagnosis not present

## 2022-09-16 DIAGNOSIS — I10 Essential (primary) hypertension: Secondary | ICD-10-CM | POA: Diagnosis not present

## 2022-09-16 DIAGNOSIS — R2681 Unsteadiness on feet: Secondary | ICD-10-CM | POA: Diagnosis not present

## 2022-09-16 DIAGNOSIS — R296 Repeated falls: Secondary | ICD-10-CM | POA: Diagnosis not present

## 2022-09-18 DIAGNOSIS — H269 Unspecified cataract: Secondary | ICD-10-CM | POA: Diagnosis not present

## 2022-09-18 DIAGNOSIS — H2511 Age-related nuclear cataract, right eye: Secondary | ICD-10-CM | POA: Diagnosis not present

## 2022-09-18 DIAGNOSIS — H25811 Combined forms of age-related cataract, right eye: Secondary | ICD-10-CM | POA: Diagnosis not present

## 2022-09-22 DIAGNOSIS — R2689 Other abnormalities of gait and mobility: Secondary | ICD-10-CM | POA: Diagnosis not present

## 2022-09-22 DIAGNOSIS — R296 Repeated falls: Secondary | ICD-10-CM | POA: Diagnosis not present

## 2022-09-22 DIAGNOSIS — I10 Essential (primary) hypertension: Secondary | ICD-10-CM | POA: Diagnosis not present

## 2022-09-22 DIAGNOSIS — E1122 Type 2 diabetes mellitus with diabetic chronic kidney disease: Secondary | ICD-10-CM | POA: Diagnosis not present

## 2022-09-22 DIAGNOSIS — R2681 Unsteadiness on feet: Secondary | ICD-10-CM | POA: Diagnosis not present

## 2022-09-22 DIAGNOSIS — M6281 Muscle weakness (generalized): Secondary | ICD-10-CM | POA: Diagnosis not present

## 2022-09-22 DIAGNOSIS — N183 Chronic kidney disease, stage 3 unspecified: Secondary | ICD-10-CM | POA: Diagnosis not present

## 2022-09-22 DIAGNOSIS — R262 Difficulty in walking, not elsewhere classified: Secondary | ICD-10-CM | POA: Diagnosis not present

## 2022-09-22 DIAGNOSIS — R531 Weakness: Secondary | ICD-10-CM | POA: Diagnosis not present

## 2022-09-22 DIAGNOSIS — R269 Unspecified abnormalities of gait and mobility: Secondary | ICD-10-CM | POA: Diagnosis not present

## 2022-09-24 DIAGNOSIS — I10 Essential (primary) hypertension: Secondary | ICD-10-CM | POA: Diagnosis not present

## 2022-09-24 DIAGNOSIS — R2681 Unsteadiness on feet: Secondary | ICD-10-CM | POA: Diagnosis not present

## 2022-09-24 DIAGNOSIS — R2689 Other abnormalities of gait and mobility: Secondary | ICD-10-CM | POA: Diagnosis not present

## 2022-09-24 DIAGNOSIS — N183 Chronic kidney disease, stage 3 unspecified: Secondary | ICD-10-CM | POA: Diagnosis not present

## 2022-09-24 DIAGNOSIS — R296 Repeated falls: Secondary | ICD-10-CM | POA: Diagnosis not present

## 2022-09-24 DIAGNOSIS — R269 Unspecified abnormalities of gait and mobility: Secondary | ICD-10-CM | POA: Diagnosis not present

## 2022-09-24 DIAGNOSIS — R531 Weakness: Secondary | ICD-10-CM | POA: Diagnosis not present

## 2022-09-24 DIAGNOSIS — E1122 Type 2 diabetes mellitus with diabetic chronic kidney disease: Secondary | ICD-10-CM | POA: Diagnosis not present

## 2022-09-29 DIAGNOSIS — R269 Unspecified abnormalities of gait and mobility: Secondary | ICD-10-CM | POA: Diagnosis not present

## 2022-09-29 DIAGNOSIS — N183 Chronic kidney disease, stage 3 unspecified: Secondary | ICD-10-CM | POA: Diagnosis not present

## 2022-09-29 DIAGNOSIS — I10 Essential (primary) hypertension: Secondary | ICD-10-CM | POA: Diagnosis not present

## 2022-09-29 DIAGNOSIS — R531 Weakness: Secondary | ICD-10-CM | POA: Diagnosis not present

## 2022-09-29 DIAGNOSIS — R2681 Unsteadiness on feet: Secondary | ICD-10-CM | POA: Diagnosis not present

## 2022-09-29 DIAGNOSIS — R2689 Other abnormalities of gait and mobility: Secondary | ICD-10-CM | POA: Diagnosis not present

## 2022-09-29 DIAGNOSIS — E1122 Type 2 diabetes mellitus with diabetic chronic kidney disease: Secondary | ICD-10-CM | POA: Diagnosis not present

## 2022-09-29 DIAGNOSIS — R296 Repeated falls: Secondary | ICD-10-CM | POA: Diagnosis not present

## 2022-09-30 DIAGNOSIS — R531 Weakness: Secondary | ICD-10-CM | POA: Diagnosis not present

## 2022-09-30 DIAGNOSIS — E1122 Type 2 diabetes mellitus with diabetic chronic kidney disease: Secondary | ICD-10-CM | POA: Diagnosis not present

## 2022-09-30 DIAGNOSIS — R269 Unspecified abnormalities of gait and mobility: Secondary | ICD-10-CM | POA: Diagnosis not present

## 2022-09-30 DIAGNOSIS — M6281 Muscle weakness (generalized): Secondary | ICD-10-CM | POA: Diagnosis not present

## 2022-09-30 DIAGNOSIS — R296 Repeated falls: Secondary | ICD-10-CM | POA: Diagnosis not present

## 2022-09-30 DIAGNOSIS — I10 Essential (primary) hypertension: Secondary | ICD-10-CM | POA: Diagnosis not present

## 2022-09-30 DIAGNOSIS — R262 Difficulty in walking, not elsewhere classified: Secondary | ICD-10-CM | POA: Diagnosis not present

## 2022-09-30 DIAGNOSIS — N183 Chronic kidney disease, stage 3 unspecified: Secondary | ICD-10-CM | POA: Diagnosis not present

## 2022-09-30 DIAGNOSIS — R2681 Unsteadiness on feet: Secondary | ICD-10-CM | POA: Diagnosis not present

## 2022-09-30 DIAGNOSIS — R2689 Other abnormalities of gait and mobility: Secondary | ICD-10-CM | POA: Diagnosis not present

## 2022-10-02 DIAGNOSIS — R531 Weakness: Secondary | ICD-10-CM | POA: Diagnosis not present

## 2022-10-02 DIAGNOSIS — R2681 Unsteadiness on feet: Secondary | ICD-10-CM | POA: Diagnosis not present

## 2022-10-02 DIAGNOSIS — I10 Essential (primary) hypertension: Secondary | ICD-10-CM | POA: Diagnosis not present

## 2022-10-02 DIAGNOSIS — R2689 Other abnormalities of gait and mobility: Secondary | ICD-10-CM | POA: Diagnosis not present

## 2022-10-02 DIAGNOSIS — N183 Chronic kidney disease, stage 3 unspecified: Secondary | ICD-10-CM | POA: Diagnosis not present

## 2022-10-02 DIAGNOSIS — E1122 Type 2 diabetes mellitus with diabetic chronic kidney disease: Secondary | ICD-10-CM | POA: Diagnosis not present

## 2022-10-02 DIAGNOSIS — R296 Repeated falls: Secondary | ICD-10-CM | POA: Diagnosis not present

## 2022-10-02 DIAGNOSIS — R262 Difficulty in walking, not elsewhere classified: Secondary | ICD-10-CM | POA: Diagnosis not present

## 2022-10-02 DIAGNOSIS — M6281 Muscle weakness (generalized): Secondary | ICD-10-CM | POA: Diagnosis not present

## 2022-10-02 DIAGNOSIS — R269 Unspecified abnormalities of gait and mobility: Secondary | ICD-10-CM | POA: Diagnosis not present

## 2022-10-03 DIAGNOSIS — I10 Essential (primary) hypertension: Secondary | ICD-10-CM | POA: Diagnosis not present

## 2022-10-03 DIAGNOSIS — K439 Ventral hernia without obstruction or gangrene: Secondary | ICD-10-CM | POA: Diagnosis not present

## 2022-10-03 DIAGNOSIS — Z6836 Body mass index (BMI) 36.0-36.9, adult: Secondary | ICD-10-CM | POA: Diagnosis not present

## 2022-10-03 DIAGNOSIS — R269 Unspecified abnormalities of gait and mobility: Secondary | ICD-10-CM | POA: Diagnosis not present

## 2022-10-03 DIAGNOSIS — R030 Elevated blood-pressure reading, without diagnosis of hypertension: Secondary | ICD-10-CM | POA: Diagnosis not present

## 2022-10-03 DIAGNOSIS — R2681 Unsteadiness on feet: Secondary | ICD-10-CM | POA: Diagnosis not present

## 2022-10-03 DIAGNOSIS — R531 Weakness: Secondary | ICD-10-CM | POA: Diagnosis not present

## 2022-10-03 DIAGNOSIS — E1122 Type 2 diabetes mellitus with diabetic chronic kidney disease: Secondary | ICD-10-CM | POA: Diagnosis not present

## 2022-10-03 DIAGNOSIS — N183 Chronic kidney disease, stage 3 unspecified: Secondary | ICD-10-CM | POA: Diagnosis not present

## 2022-10-03 DIAGNOSIS — R2689 Other abnormalities of gait and mobility: Secondary | ICD-10-CM | POA: Diagnosis not present

## 2022-10-03 DIAGNOSIS — R296 Repeated falls: Secondary | ICD-10-CM | POA: Diagnosis not present

## 2022-10-15 DIAGNOSIS — N390 Urinary tract infection, site not specified: Secondary | ICD-10-CM | POA: Diagnosis not present

## 2022-10-27 DIAGNOSIS — R296 Repeated falls: Secondary | ICD-10-CM | POA: Diagnosis not present

## 2022-10-27 DIAGNOSIS — M6281 Muscle weakness (generalized): Secondary | ICD-10-CM | POA: Diagnosis not present

## 2022-10-27 DIAGNOSIS — R262 Difficulty in walking, not elsewhere classified: Secondary | ICD-10-CM | POA: Diagnosis not present

## 2022-10-27 DIAGNOSIS — E1122 Type 2 diabetes mellitus with diabetic chronic kidney disease: Secondary | ICD-10-CM | POA: Diagnosis not present

## 2022-10-27 DIAGNOSIS — I10 Essential (primary) hypertension: Secondary | ICD-10-CM | POA: Diagnosis not present

## 2022-11-07 DIAGNOSIS — I10 Essential (primary) hypertension: Secondary | ICD-10-CM | POA: Diagnosis not present

## 2022-11-07 DIAGNOSIS — R296 Repeated falls: Secondary | ICD-10-CM | POA: Diagnosis not present

## 2022-11-07 DIAGNOSIS — M6281 Muscle weakness (generalized): Secondary | ICD-10-CM | POA: Diagnosis not present

## 2022-11-07 DIAGNOSIS — E1122 Type 2 diabetes mellitus with diabetic chronic kidney disease: Secondary | ICD-10-CM | POA: Diagnosis not present

## 2022-11-07 DIAGNOSIS — R262 Difficulty in walking, not elsewhere classified: Secondary | ICD-10-CM | POA: Diagnosis not present

## 2022-11-10 DIAGNOSIS — B351 Tinea unguium: Secondary | ICD-10-CM | POA: Diagnosis not present

## 2022-11-10 DIAGNOSIS — L851 Acquired keratosis [keratoderma] palmaris et plantaris: Secondary | ICD-10-CM | POA: Diagnosis not present

## 2022-11-10 DIAGNOSIS — E1142 Type 2 diabetes mellitus with diabetic polyneuropathy: Secondary | ICD-10-CM | POA: Diagnosis not present

## 2022-11-11 DIAGNOSIS — R531 Weakness: Secondary | ICD-10-CM | POA: Diagnosis not present

## 2022-11-11 DIAGNOSIS — E1122 Type 2 diabetes mellitus with diabetic chronic kidney disease: Secondary | ICD-10-CM | POA: Diagnosis not present

## 2022-11-11 DIAGNOSIS — R296 Repeated falls: Secondary | ICD-10-CM | POA: Diagnosis not present

## 2022-11-11 DIAGNOSIS — I10 Essential (primary) hypertension: Secondary | ICD-10-CM | POA: Diagnosis not present

## 2022-11-11 DIAGNOSIS — R2689 Other abnormalities of gait and mobility: Secondary | ICD-10-CM | POA: Diagnosis not present

## 2022-11-11 DIAGNOSIS — R2681 Unsteadiness on feet: Secondary | ICD-10-CM | POA: Diagnosis not present

## 2022-11-11 DIAGNOSIS — N183 Chronic kidney disease, stage 3 unspecified: Secondary | ICD-10-CM | POA: Diagnosis not present

## 2022-11-11 DIAGNOSIS — R269 Unspecified abnormalities of gait and mobility: Secondary | ICD-10-CM | POA: Diagnosis not present

## 2022-11-12 DIAGNOSIS — R269 Unspecified abnormalities of gait and mobility: Secondary | ICD-10-CM | POA: Diagnosis not present

## 2022-11-12 DIAGNOSIS — E1122 Type 2 diabetes mellitus with diabetic chronic kidney disease: Secondary | ICD-10-CM | POA: Diagnosis not present

## 2022-11-12 DIAGNOSIS — I10 Essential (primary) hypertension: Secondary | ICD-10-CM | POA: Diagnosis not present

## 2022-11-12 DIAGNOSIS — N183 Chronic kidney disease, stage 3 unspecified: Secondary | ICD-10-CM | POA: Diagnosis not present

## 2022-11-12 DIAGNOSIS — R2689 Other abnormalities of gait and mobility: Secondary | ICD-10-CM | POA: Diagnosis not present

## 2022-11-12 DIAGNOSIS — R531 Weakness: Secondary | ICD-10-CM | POA: Diagnosis not present

## 2022-11-12 DIAGNOSIS — R2681 Unsteadiness on feet: Secondary | ICD-10-CM | POA: Diagnosis not present

## 2022-11-12 DIAGNOSIS — R296 Repeated falls: Secondary | ICD-10-CM | POA: Diagnosis not present

## 2022-11-13 DIAGNOSIS — M6281 Muscle weakness (generalized): Secondary | ICD-10-CM | POA: Diagnosis not present

## 2022-11-13 DIAGNOSIS — R269 Unspecified abnormalities of gait and mobility: Secondary | ICD-10-CM | POA: Diagnosis not present

## 2022-11-13 DIAGNOSIS — R2681 Unsteadiness on feet: Secondary | ICD-10-CM | POA: Diagnosis not present

## 2022-11-13 DIAGNOSIS — N183 Chronic kidney disease, stage 3 unspecified: Secondary | ICD-10-CM | POA: Diagnosis not present

## 2022-11-13 DIAGNOSIS — R531 Weakness: Secondary | ICD-10-CM | POA: Diagnosis not present

## 2022-11-13 DIAGNOSIS — E1122 Type 2 diabetes mellitus with diabetic chronic kidney disease: Secondary | ICD-10-CM | POA: Diagnosis not present

## 2022-11-13 DIAGNOSIS — R296 Repeated falls: Secondary | ICD-10-CM | POA: Diagnosis not present

## 2022-11-13 DIAGNOSIS — I10 Essential (primary) hypertension: Secondary | ICD-10-CM | POA: Diagnosis not present

## 2022-11-13 DIAGNOSIS — R2689 Other abnormalities of gait and mobility: Secondary | ICD-10-CM | POA: Diagnosis not present

## 2022-11-13 DIAGNOSIS — R262 Difficulty in walking, not elsewhere classified: Secondary | ICD-10-CM | POA: Diagnosis not present

## 2022-11-18 DIAGNOSIS — R2689 Other abnormalities of gait and mobility: Secondary | ICD-10-CM | POA: Diagnosis not present

## 2022-11-18 DIAGNOSIS — R269 Unspecified abnormalities of gait and mobility: Secondary | ICD-10-CM | POA: Diagnosis not present

## 2022-11-18 DIAGNOSIS — R2681 Unsteadiness on feet: Secondary | ICD-10-CM | POA: Diagnosis not present

## 2022-11-18 DIAGNOSIS — N183 Chronic kidney disease, stage 3 unspecified: Secondary | ICD-10-CM | POA: Diagnosis not present

## 2022-11-18 DIAGNOSIS — R296 Repeated falls: Secondary | ICD-10-CM | POA: Diagnosis not present

## 2022-11-18 DIAGNOSIS — E1122 Type 2 diabetes mellitus with diabetic chronic kidney disease: Secondary | ICD-10-CM | POA: Diagnosis not present

## 2022-11-18 DIAGNOSIS — R531 Weakness: Secondary | ICD-10-CM | POA: Diagnosis not present

## 2022-11-18 DIAGNOSIS — I10 Essential (primary) hypertension: Secondary | ICD-10-CM | POA: Diagnosis not present

## 2022-11-19 DIAGNOSIS — R2689 Other abnormalities of gait and mobility: Secondary | ICD-10-CM | POA: Diagnosis not present

## 2022-11-19 DIAGNOSIS — R531 Weakness: Secondary | ICD-10-CM | POA: Diagnosis not present

## 2022-11-19 DIAGNOSIS — R296 Repeated falls: Secondary | ICD-10-CM | POA: Diagnosis not present

## 2022-11-19 DIAGNOSIS — E1122 Type 2 diabetes mellitus with diabetic chronic kidney disease: Secondary | ICD-10-CM | POA: Diagnosis not present

## 2022-11-19 DIAGNOSIS — R269 Unspecified abnormalities of gait and mobility: Secondary | ICD-10-CM | POA: Diagnosis not present

## 2022-11-19 DIAGNOSIS — R2681 Unsteadiness on feet: Secondary | ICD-10-CM | POA: Diagnosis not present

## 2022-11-19 DIAGNOSIS — N183 Chronic kidney disease, stage 3 unspecified: Secondary | ICD-10-CM | POA: Diagnosis not present

## 2022-11-19 DIAGNOSIS — I10 Essential (primary) hypertension: Secondary | ICD-10-CM | POA: Diagnosis not present

## 2022-11-21 ENCOUNTER — Other Ambulatory Visit: Payer: Self-pay

## 2022-11-21 ENCOUNTER — Encounter (HOSPITAL_COMMUNITY): Payer: Self-pay | Admitting: *Deleted

## 2022-11-21 DIAGNOSIS — R110 Nausea: Secondary | ICD-10-CM | POA: Diagnosis not present

## 2022-11-21 DIAGNOSIS — M79673 Pain in unspecified foot: Secondary | ICD-10-CM | POA: Diagnosis not present

## 2022-11-21 DIAGNOSIS — I10 Essential (primary) hypertension: Secondary | ICD-10-CM | POA: Diagnosis not present

## 2022-11-21 DIAGNOSIS — G629 Polyneuropathy, unspecified: Secondary | ICD-10-CM | POA: Diagnosis not present

## 2022-11-21 DIAGNOSIS — R52 Pain, unspecified: Secondary | ICD-10-CM | POA: Diagnosis not present

## 2022-11-21 DIAGNOSIS — R5381 Other malaise: Secondary | ICD-10-CM | POA: Diagnosis not present

## 2022-11-21 DIAGNOSIS — M79671 Pain in right foot: Secondary | ICD-10-CM | POA: Diagnosis present

## 2022-11-21 NOTE — ED Triage Notes (Signed)
Pt with bilateral foot pain, reported that pt has not had her Lyrica for past 2 days.  Pt states she has neuropathy to feet.  Pt with nausea and emesis today. Emesis x3 this morning and x 4 PTA per pt. Pt has DNR

## 2022-11-22 ENCOUNTER — Emergency Department (HOSPITAL_COMMUNITY)
Admission: EM | Admit: 2022-11-22 | Discharge: 2022-11-22 | Disposition: A | Discharge status: Skilled Nursing Facility | Payer: Medicare Other | Attending: Emergency Medicine | Admitting: Emergency Medicine

## 2022-11-22 DIAGNOSIS — Z7401 Bed confinement status: Secondary | ICD-10-CM | POA: Diagnosis not present

## 2022-11-22 DIAGNOSIS — G629 Polyneuropathy, unspecified: Secondary | ICD-10-CM

## 2022-11-22 DIAGNOSIS — I10 Essential (primary) hypertension: Secondary | ICD-10-CM | POA: Diagnosis not present

## 2022-11-22 MED ORDER — PREGABALIN 75 MG PO CAPS
200.0000 mg | ORAL_CAPSULE | ORAL | Status: AC
  Administered 2022-11-22: 200 mg via ORAL
  Filled 2022-11-22: qty 1

## 2022-11-22 MED ORDER — OXYCODONE-ACETAMINOPHEN 5-325 MG PO TABS
1.0000 | ORAL_TABLET | Freq: Once | ORAL | Status: AC
Start: 2022-11-22 — End: 2022-11-22
  Administered 2022-11-22: 1 via ORAL
  Filled 2022-11-22: qty 1

## 2022-11-22 NOTE — ED Notes (Signed)
Pt a/w transport back to [facility]- EMS will not have available truck until the AM

## 2022-11-22 NOTE — ED Provider Notes (Signed)
[facility] EMERGENCY DEPARTMENT Provider Note   CSN: [id number] Arrival date & time: 11/21/22  2209     History  Chief Complaint  Patient presents with   Foot Pain    Kathryn Ryan is a 81 y.o. female.  Patient presents to the emergency department for evaluation of bilateral foot and lower leg pain.  Patient reports a history of severe peripheral neuropathy.  She normally takes Lyrica for this but ran out of it and her pharmacy has not been able to get a supply to her.  She has been off the medication for 3 days.  Patient reports burning pain of both feet up to about the knees bilaterally.  Symptoms are symmetric and similar to neuropathy pain she has had previously.       Home Medications Prior to Admission medications   Medication Sig Start Date End Date Taking? Authorizing Provider  busPIRone (BUSPAR) 15 MG tablet Take 15 mg by mouth 3 (three) times daily.     [provider]  clonazePAM (KLONOPIN) 1 MG tablet Take 1 mg by mouth 3 (three) times daily as needed. 10/19/15   [provider]  Cranberry 1000 MG CAPS Take by mouth.    [provider]  Docusate Sodium (COLACE PO) Take 2 capsules by mouth daily as needed.     [provider]  DULoxetine (CYMBALTA) 60 MG capsule Take 60 mg by mouth 2 (two) times daily.    [provider]  ibuprofen (ADVIL,MOTRIN) 200 MG tablet Take 200 mg by mouth every 6 (six) hours as needed.    [provider]  insulin glargine (LANTUS) 100 UNIT/ML injection Inject 120 Units into the skin at bedtime.    [provider]  levothyroxine (SYNTHROID, LEVOTHROID) 150 MCG tablet Take 150 mcg by mouth daily before breakfast.    [provider]  losartan-hydrochlorothiazide (HYZAAR) 100-12.5 MG tablet Take 1 tablet by mouth daily. 06/19/16   [provider]  metFORMIN (GLUCOPHAGE-XR) 500 MG 24 hr tablet Take 500 mg by mouth daily. 06/03/16 05/28/17  [provider]   metFORMIN (GLUCOPHAGE-XR) 500 MG 24 hr tablet Take 500 mg by mouth daily. 06/03/16   [provider]  pregabalin (LYRICA) 150 MG capsule Take 300 mg by mouth 2 (two) times daily.     [provider]  RABEprazole (ACIPHEX) 20 MG tablet Take 20 mg by mouth daily.    [provider]      Allergies    Statins and Sulfa antibiotics    Review of Systems   Review of Systems  Physical Exam Updated Vital Signs BP (!) 192/99 (BP Location: Right Arm)   Pulse 94   Temp 98.1 F (36.7 C) (Oral)   Resp 18   Ht '5\' 6"'$  (1.676 m)   Wt 104.8 kg   SpO2 99%   BMI 37.28 kg/m  Physical Exam Vitals and nursing note reviewed.  Constitutional:      General: She is not in acute distress.    Appearance: She is well-developed.  HENT:     Head: Normocephalic and atraumatic.     Mouth/Throat:     Mouth: Mucous membranes are moist.  Eyes:     General: Vision grossly intact. Gaze aligned appropriately.     Extraocular Movements: Extraocular movements intact.     Conjunctiva/sclera: Conjunctivae normal.  Cardiovascular:     Rate and Rhythm: Normal rate and regular rhythm.     Pulses:  Dorsalis pedis pulses are 1+ on the right side and 1+ on the left side.     Heart sounds: Normal heart sounds, S1 normal and S2 normal. No murmur heard.    No friction rub. No gallop.  Pulmonary:     Effort: Pulmonary effort is normal. No respiratory distress.     Breath sounds: Normal breath sounds.  Abdominal:     General: Bowel sounds are normal.     Palpations: Abdomen is soft.     Tenderness: There is no abdominal tenderness. There is no guarding or rebound.     Hernia: No hernia is present.  Musculoskeletal:        General: No swelling.     Cervical back: Full passive range of motion without pain, normal range of motion and neck supple. No spinous process tenderness or muscular tenderness. Normal range of motion.     Right lower leg: No edema.     Left lower leg: No edema.   Skin:    General: Skin is warm and dry.     Capillary Refill: Capillary refill takes less than 2 seconds.     Findings: No ecchymosis, erythema, rash or wound.  Neurological:     General: No focal deficit present.     Mental Status: She is alert and oriented to person, place, and time.     GCS: GCS eye subscore is 4. GCS verbal subscore is 5. GCS motor subscore is 6.     Cranial Nerves: Cranial nerves 2-12 are intact.     Sensory: Sensation is intact.     Motor: Motor function is intact.     Coordination: Coordination is intact.  Psychiatric:        Attention and Perception: Attention normal.        Mood and Affect: Mood normal.        Speech: Speech normal.        Behavior: Behavior normal.     ED Results / Procedures / Treatments   Labs (all labs ordered are listed, but only abnormal results are displayed) Labs Reviewed - No data to display  EKG None  Radiology No results found.  Procedures Procedures    Medications Ordered in ED Medications  pregabalin (LYRICA) capsule 200 mg (has no administration in time range)  oxyCODONE-acetaminophen (PERCOCET/ROXICET) 5-325 MG per tablet 1 tablet (has no administration in time range)    ED Course/ Medical Decision Making/ A&P                           Medical Decision Making Risk Prescription drug management.   Presents to the emergency department for evaluation of bilateral foot and lower leg pain, burning sensation consistent with prior peripheral neuropathy exacerbations.  Symptoms likely secondary to being off of her Lyrica which she is normally maintained on.  She has not been able to get a refill from her pharmacy, has been off of it for 3 days.  Examination is unremarkable.  Normal skin color, no erythema or rash.  Pulses are palpable bilaterally.  No concern for infection, vascular emergency.  Will treat symptomatically.        Final Clinical Impression(s) / ED Diagnoses Final diagnoses:  Neuropathy     Rx / DC Orders ED Discharge Orders     None         Jeffory Snelgrove, Gwenyth Allegra, MD 11/22/22 0159

## 2022-11-25 DIAGNOSIS — R531 Weakness: Secondary | ICD-10-CM | POA: Diagnosis not present

## 2022-11-25 DIAGNOSIS — R269 Unspecified abnormalities of gait and mobility: Secondary | ICD-10-CM | POA: Diagnosis not present

## 2022-11-25 DIAGNOSIS — W19XXXA Unspecified fall, initial encounter: Secondary | ICD-10-CM | POA: Diagnosis not present

## 2022-11-25 DIAGNOSIS — N183 Chronic kidney disease, stage 3 unspecified: Secondary | ICD-10-CM | POA: Diagnosis not present

## 2022-11-25 DIAGNOSIS — R296 Repeated falls: Secondary | ICD-10-CM | POA: Diagnosis not present

## 2022-11-25 DIAGNOSIS — T1490XA Injury, unspecified, initial encounter: Secondary | ICD-10-CM | POA: Diagnosis not present

## 2022-11-25 DIAGNOSIS — R2689 Other abnormalities of gait and mobility: Secondary | ICD-10-CM | POA: Diagnosis not present

## 2022-11-25 DIAGNOSIS — E1122 Type 2 diabetes mellitus with diabetic chronic kidney disease: Secondary | ICD-10-CM | POA: Diagnosis not present

## 2022-11-25 DIAGNOSIS — R2681 Unsteadiness on feet: Secondary | ICD-10-CM | POA: Diagnosis not present

## 2022-11-25 DIAGNOSIS — R5381 Other malaise: Secondary | ICD-10-CM | POA: Diagnosis not present

## 2022-11-25 DIAGNOSIS — I10 Essential (primary) hypertension: Secondary | ICD-10-CM | POA: Diagnosis not present

## 2022-11-26 DIAGNOSIS — R531 Weakness: Secondary | ICD-10-CM | POA: Diagnosis not present

## 2022-11-26 DIAGNOSIS — R296 Repeated falls: Secondary | ICD-10-CM | POA: Diagnosis not present

## 2022-11-26 DIAGNOSIS — N183 Chronic kidney disease, stage 3 unspecified: Secondary | ICD-10-CM | POA: Diagnosis not present

## 2022-11-26 DIAGNOSIS — I10 Essential (primary) hypertension: Secondary | ICD-10-CM | POA: Diagnosis not present

## 2022-11-26 DIAGNOSIS — E1122 Type 2 diabetes mellitus with diabetic chronic kidney disease: Secondary | ICD-10-CM | POA: Diagnosis not present

## 2022-11-26 DIAGNOSIS — R269 Unspecified abnormalities of gait and mobility: Secondary | ICD-10-CM | POA: Diagnosis not present

## 2022-11-26 DIAGNOSIS — R2689 Other abnormalities of gait and mobility: Secondary | ICD-10-CM | POA: Diagnosis not present

## 2022-11-26 DIAGNOSIS — R2681 Unsteadiness on feet: Secondary | ICD-10-CM | POA: Diagnosis not present

## 2022-11-27 DIAGNOSIS — R296 Repeated falls: Secondary | ICD-10-CM | POA: Diagnosis not present

## 2022-11-27 DIAGNOSIS — R269 Unspecified abnormalities of gait and mobility: Secondary | ICD-10-CM | POA: Diagnosis not present

## 2022-11-27 DIAGNOSIS — N183 Chronic kidney disease, stage 3 unspecified: Secondary | ICD-10-CM | POA: Diagnosis not present

## 2022-11-27 DIAGNOSIS — E1122 Type 2 diabetes mellitus with diabetic chronic kidney disease: Secondary | ICD-10-CM | POA: Diagnosis not present

## 2022-11-27 DIAGNOSIS — R2689 Other abnormalities of gait and mobility: Secondary | ICD-10-CM | POA: Diagnosis not present

## 2022-11-27 DIAGNOSIS — R531 Weakness: Secondary | ICD-10-CM | POA: Diagnosis not present

## 2022-11-27 DIAGNOSIS — R2681 Unsteadiness on feet: Secondary | ICD-10-CM | POA: Diagnosis not present

## 2022-11-27 DIAGNOSIS — I10 Essential (primary) hypertension: Secondary | ICD-10-CM | POA: Diagnosis not present

## 2022-12-02 DIAGNOSIS — N183 Chronic kidney disease, stage 3 unspecified: Secondary | ICD-10-CM | POA: Diagnosis not present

## 2022-12-02 DIAGNOSIS — R296 Repeated falls: Secondary | ICD-10-CM | POA: Diagnosis not present

## 2022-12-02 DIAGNOSIS — E1122 Type 2 diabetes mellitus with diabetic chronic kidney disease: Secondary | ICD-10-CM | POA: Diagnosis not present

## 2022-12-02 DIAGNOSIS — R269 Unspecified abnormalities of gait and mobility: Secondary | ICD-10-CM | POA: Diagnosis not present

## 2022-12-02 DIAGNOSIS — R2681 Unsteadiness on feet: Secondary | ICD-10-CM | POA: Diagnosis not present

## 2022-12-02 DIAGNOSIS — I10 Essential (primary) hypertension: Secondary | ICD-10-CM | POA: Diagnosis not present

## 2022-12-02 DIAGNOSIS — R2689 Other abnormalities of gait and mobility: Secondary | ICD-10-CM | POA: Diagnosis not present

## 2022-12-02 DIAGNOSIS — R531 Weakness: Secondary | ICD-10-CM | POA: Diagnosis not present

## 2022-12-03 DIAGNOSIS — I10 Essential (primary) hypertension: Secondary | ICD-10-CM | POA: Diagnosis not present

## 2022-12-03 DIAGNOSIS — R2681 Unsteadiness on feet: Secondary | ICD-10-CM | POA: Diagnosis not present

## 2022-12-03 DIAGNOSIS — N183 Chronic kidney disease, stage 3 unspecified: Secondary | ICD-10-CM | POA: Diagnosis not present

## 2022-12-03 DIAGNOSIS — E1122 Type 2 diabetes mellitus with diabetic chronic kidney disease: Secondary | ICD-10-CM | POA: Diagnosis not present

## 2022-12-03 DIAGNOSIS — R269 Unspecified abnormalities of gait and mobility: Secondary | ICD-10-CM | POA: Diagnosis not present

## 2022-12-03 DIAGNOSIS — R2689 Other abnormalities of gait and mobility: Secondary | ICD-10-CM | POA: Diagnosis not present

## 2022-12-03 DIAGNOSIS — R531 Weakness: Secondary | ICD-10-CM | POA: Diagnosis not present

## 2022-12-03 DIAGNOSIS — R296 Repeated falls: Secondary | ICD-10-CM | POA: Diagnosis not present

## 2022-12-04 DIAGNOSIS — M6281 Muscle weakness (generalized): Secondary | ICD-10-CM | POA: Diagnosis not present

## 2022-12-04 DIAGNOSIS — R296 Repeated falls: Secondary | ICD-10-CM | POA: Diagnosis not present

## 2022-12-04 DIAGNOSIS — I10 Essential (primary) hypertension: Secondary | ICD-10-CM | POA: Diagnosis not present

## 2022-12-04 DIAGNOSIS — R269 Unspecified abnormalities of gait and mobility: Secondary | ICD-10-CM | POA: Diagnosis not present

## 2022-12-04 DIAGNOSIS — R531 Weakness: Secondary | ICD-10-CM | POA: Diagnosis not present

## 2022-12-04 DIAGNOSIS — E1122 Type 2 diabetes mellitus with diabetic chronic kidney disease: Secondary | ICD-10-CM | POA: Diagnosis not present

## 2022-12-04 DIAGNOSIS — N183 Chronic kidney disease, stage 3 unspecified: Secondary | ICD-10-CM | POA: Diagnosis not present

## 2022-12-04 DIAGNOSIS — R262 Difficulty in walking, not elsewhere classified: Secondary | ICD-10-CM | POA: Diagnosis not present

## 2022-12-04 DIAGNOSIS — R2689 Other abnormalities of gait and mobility: Secondary | ICD-10-CM | POA: Diagnosis not present

## 2022-12-04 DIAGNOSIS — R2681 Unsteadiness on feet: Secondary | ICD-10-CM | POA: Diagnosis not present

## 2022-12-19 DIAGNOSIS — R2681 Unsteadiness on feet: Secondary | ICD-10-CM | POA: Diagnosis not present

## 2022-12-19 DIAGNOSIS — N183 Chronic kidney disease, stage 3 unspecified: Secondary | ICD-10-CM | POA: Diagnosis not present

## 2022-12-19 DIAGNOSIS — E1122 Type 2 diabetes mellitus with diabetic chronic kidney disease: Secondary | ICD-10-CM | POA: Diagnosis not present

## 2022-12-19 DIAGNOSIS — I10 Essential (primary) hypertension: Secondary | ICD-10-CM | POA: Diagnosis not present

## 2022-12-19 DIAGNOSIS — R2689 Other abnormalities of gait and mobility: Secondary | ICD-10-CM | POA: Diagnosis not present

## 2022-12-19 DIAGNOSIS — R531 Weakness: Secondary | ICD-10-CM | POA: Diagnosis not present

## 2022-12-19 DIAGNOSIS — R269 Unspecified abnormalities of gait and mobility: Secondary | ICD-10-CM | POA: Diagnosis not present

## 2022-12-19 DIAGNOSIS — R296 Repeated falls: Secondary | ICD-10-CM | POA: Diagnosis not present

## 2022-12-22 DIAGNOSIS — R2689 Other abnormalities of gait and mobility: Secondary | ICD-10-CM | POA: Diagnosis not present

## 2022-12-22 DIAGNOSIS — R531 Weakness: Secondary | ICD-10-CM | POA: Diagnosis not present

## 2022-12-22 DIAGNOSIS — R269 Unspecified abnormalities of gait and mobility: Secondary | ICD-10-CM | POA: Diagnosis not present

## 2022-12-22 DIAGNOSIS — R2681 Unsteadiness on feet: Secondary | ICD-10-CM | POA: Diagnosis not present

## 2022-12-22 DIAGNOSIS — I10 Essential (primary) hypertension: Secondary | ICD-10-CM | POA: Diagnosis not present

## 2022-12-22 DIAGNOSIS — N183 Chronic kidney disease, stage 3 unspecified: Secondary | ICD-10-CM | POA: Diagnosis not present

## 2022-12-22 DIAGNOSIS — E1122 Type 2 diabetes mellitus with diabetic chronic kidney disease: Secondary | ICD-10-CM | POA: Diagnosis not present

## 2022-12-22 DIAGNOSIS — R296 Repeated falls: Secondary | ICD-10-CM | POA: Diagnosis not present

## 2022-12-23 DIAGNOSIS — N390 Urinary tract infection, site not specified: Secondary | ICD-10-CM | POA: Diagnosis not present

## 2022-12-23 DIAGNOSIS — Z87891 Personal history of nicotine dependence: Secondary | ICD-10-CM | POA: Diagnosis not present

## 2022-12-23 DIAGNOSIS — I10 Essential (primary) hypertension: Secondary | ICD-10-CM | POA: Diagnosis not present

## 2022-12-23 DIAGNOSIS — E1140 Type 2 diabetes mellitus with diabetic neuropathy, unspecified: Secondary | ICD-10-CM | POA: Diagnosis not present

## 2022-12-23 DIAGNOSIS — E782 Mixed hyperlipidemia: Secondary | ICD-10-CM | POA: Diagnosis not present

## 2022-12-23 DIAGNOSIS — R000 Tachycardia, unspecified: Secondary | ICD-10-CM | POA: Diagnosis not present

## 2022-12-23 DIAGNOSIS — N200 Calculus of kidney: Secondary | ICD-10-CM | POA: Diagnosis not present

## 2022-12-23 DIAGNOSIS — R109 Unspecified abdominal pain: Secondary | ICD-10-CM | POA: Diagnosis not present

## 2022-12-24 DIAGNOSIS — I10 Essential (primary) hypertension: Secondary | ICD-10-CM | POA: Diagnosis not present

## 2022-12-24 DIAGNOSIS — E1122 Type 2 diabetes mellitus with diabetic chronic kidney disease: Secondary | ICD-10-CM | POA: Diagnosis not present

## 2022-12-24 DIAGNOSIS — R296 Repeated falls: Secondary | ICD-10-CM | POA: Diagnosis not present

## 2022-12-24 DIAGNOSIS — M6281 Muscle weakness (generalized): Secondary | ICD-10-CM | POA: Diagnosis not present

## 2022-12-24 DIAGNOSIS — R262 Difficulty in walking, not elsewhere classified: Secondary | ICD-10-CM | POA: Diagnosis not present

## 2022-12-25 DIAGNOSIS — I10 Essential (primary) hypertension: Secondary | ICD-10-CM | POA: Diagnosis not present

## 2022-12-25 DIAGNOSIS — E1122 Type 2 diabetes mellitus with diabetic chronic kidney disease: Secondary | ICD-10-CM | POA: Diagnosis not present

## 2022-12-25 DIAGNOSIS — R262 Difficulty in walking, not elsewhere classified: Secondary | ICD-10-CM | POA: Diagnosis not present

## 2022-12-25 DIAGNOSIS — R296 Repeated falls: Secondary | ICD-10-CM | POA: Diagnosis not present

## 2022-12-25 DIAGNOSIS — M6281 Muscle weakness (generalized): Secondary | ICD-10-CM | POA: Diagnosis not present

## 2022-12-26 DIAGNOSIS — R269 Unspecified abnormalities of gait and mobility: Secondary | ICD-10-CM | POA: Diagnosis not present

## 2022-12-26 DIAGNOSIS — R296 Repeated falls: Secondary | ICD-10-CM | POA: Diagnosis not present

## 2022-12-26 DIAGNOSIS — R2681 Unsteadiness on feet: Secondary | ICD-10-CM | POA: Diagnosis not present

## 2022-12-26 DIAGNOSIS — I10 Essential (primary) hypertension: Secondary | ICD-10-CM | POA: Diagnosis not present

## 2022-12-26 DIAGNOSIS — R531 Weakness: Secondary | ICD-10-CM | POA: Diagnosis not present

## 2022-12-26 DIAGNOSIS — R2689 Other abnormalities of gait and mobility: Secondary | ICD-10-CM | POA: Diagnosis not present

## 2022-12-26 DIAGNOSIS — E1122 Type 2 diabetes mellitus with diabetic chronic kidney disease: Secondary | ICD-10-CM | POA: Diagnosis not present

## 2022-12-26 DIAGNOSIS — N183 Chronic kidney disease, stage 3 unspecified: Secondary | ICD-10-CM | POA: Diagnosis not present

## 2022-12-29 DIAGNOSIS — R2681 Unsteadiness on feet: Secondary | ICD-10-CM | POA: Diagnosis not present

## 2022-12-29 DIAGNOSIS — R531 Weakness: Secondary | ICD-10-CM | POA: Diagnosis not present

## 2022-12-29 DIAGNOSIS — I10 Essential (primary) hypertension: Secondary | ICD-10-CM | POA: Diagnosis not present

## 2022-12-29 DIAGNOSIS — R296 Repeated falls: Secondary | ICD-10-CM | POA: Diagnosis not present

## 2022-12-29 DIAGNOSIS — N183 Chronic kidney disease, stage 3 unspecified: Secondary | ICD-10-CM | POA: Diagnosis not present

## 2022-12-29 DIAGNOSIS — R2689 Other abnormalities of gait and mobility: Secondary | ICD-10-CM | POA: Diagnosis not present

## 2022-12-29 DIAGNOSIS — R269 Unspecified abnormalities of gait and mobility: Secondary | ICD-10-CM | POA: Diagnosis not present

## 2022-12-29 DIAGNOSIS — E1122 Type 2 diabetes mellitus with diabetic chronic kidney disease: Secondary | ICD-10-CM | POA: Diagnosis not present

## 2022-12-30 DIAGNOSIS — I10 Essential (primary) hypertension: Secondary | ICD-10-CM | POA: Diagnosis not present

## 2022-12-30 DIAGNOSIS — R531 Weakness: Secondary | ICD-10-CM | POA: Diagnosis not present

## 2022-12-30 DIAGNOSIS — R2689 Other abnormalities of gait and mobility: Secondary | ICD-10-CM | POA: Diagnosis not present

## 2022-12-30 DIAGNOSIS — N183 Chronic kidney disease, stage 3 unspecified: Secondary | ICD-10-CM | POA: Diagnosis not present

## 2022-12-30 DIAGNOSIS — R269 Unspecified abnormalities of gait and mobility: Secondary | ICD-10-CM | POA: Diagnosis not present

## 2022-12-30 DIAGNOSIS — R296 Repeated falls: Secondary | ICD-10-CM | POA: Diagnosis not present

## 2022-12-30 DIAGNOSIS — E1122 Type 2 diabetes mellitus with diabetic chronic kidney disease: Secondary | ICD-10-CM | POA: Diagnosis not present

## 2022-12-30 DIAGNOSIS — R2681 Unsteadiness on feet: Secondary | ICD-10-CM | POA: Diagnosis not present

## 2022-12-31 DIAGNOSIS — E1122 Type 2 diabetes mellitus with diabetic chronic kidney disease: Secondary | ICD-10-CM | POA: Diagnosis not present

## 2022-12-31 DIAGNOSIS — I10 Essential (primary) hypertension: Secondary | ICD-10-CM | POA: Diagnosis not present

## 2022-12-31 DIAGNOSIS — M6281 Muscle weakness (generalized): Secondary | ICD-10-CM | POA: Diagnosis not present

## 2022-12-31 DIAGNOSIS — R296 Repeated falls: Secondary | ICD-10-CM | POA: Diagnosis not present

## 2022-12-31 DIAGNOSIS — R262 Difficulty in walking, not elsewhere classified: Secondary | ICD-10-CM | POA: Diagnosis not present

## 2023-01-01 DIAGNOSIS — I10 Essential (primary) hypertension: Secondary | ICD-10-CM | POA: Diagnosis not present

## 2023-01-01 DIAGNOSIS — R2689 Other abnormalities of gait and mobility: Secondary | ICD-10-CM | POA: Diagnosis not present

## 2023-01-01 DIAGNOSIS — R531 Weakness: Secondary | ICD-10-CM | POA: Diagnosis not present

## 2023-01-01 DIAGNOSIS — R296 Repeated falls: Secondary | ICD-10-CM | POA: Diagnosis not present

## 2023-01-01 DIAGNOSIS — E1122 Type 2 diabetes mellitus with diabetic chronic kidney disease: Secondary | ICD-10-CM | POA: Diagnosis not present

## 2023-01-01 DIAGNOSIS — N183 Chronic kidney disease, stage 3 unspecified: Secondary | ICD-10-CM | POA: Diagnosis not present

## 2023-01-01 DIAGNOSIS — R262 Difficulty in walking, not elsewhere classified: Secondary | ICD-10-CM | POA: Diagnosis not present

## 2023-01-01 DIAGNOSIS — M6281 Muscle weakness (generalized): Secondary | ICD-10-CM | POA: Diagnosis not present

## 2023-01-01 DIAGNOSIS — R269 Unspecified abnormalities of gait and mobility: Secondary | ICD-10-CM | POA: Diagnosis not present

## 2023-01-01 DIAGNOSIS — R2681 Unsteadiness on feet: Secondary | ICD-10-CM | POA: Diagnosis not present

## 2023-01-05 DIAGNOSIS — R531 Weakness: Secondary | ICD-10-CM | POA: Diagnosis not present

## 2023-01-05 DIAGNOSIS — E1122 Type 2 diabetes mellitus with diabetic chronic kidney disease: Secondary | ICD-10-CM | POA: Diagnosis not present

## 2023-01-05 DIAGNOSIS — R269 Unspecified abnormalities of gait and mobility: Secondary | ICD-10-CM | POA: Diagnosis not present

## 2023-01-05 DIAGNOSIS — I10 Essential (primary) hypertension: Secondary | ICD-10-CM | POA: Diagnosis not present

## 2023-01-05 DIAGNOSIS — R296 Repeated falls: Secondary | ICD-10-CM | POA: Diagnosis not present

## 2023-01-05 DIAGNOSIS — N183 Chronic kidney disease, stage 3 unspecified: Secondary | ICD-10-CM | POA: Diagnosis not present

## 2023-01-05 DIAGNOSIS — R2681 Unsteadiness on feet: Secondary | ICD-10-CM | POA: Diagnosis not present

## 2023-01-05 DIAGNOSIS — R2689 Other abnormalities of gait and mobility: Secondary | ICD-10-CM | POA: Diagnosis not present

## 2023-01-06 DIAGNOSIS — I10 Essential (primary) hypertension: Secondary | ICD-10-CM | POA: Diagnosis not present

## 2023-01-06 DIAGNOSIS — R531 Weakness: Secondary | ICD-10-CM | POA: Diagnosis not present

## 2023-01-06 DIAGNOSIS — R296 Repeated falls: Secondary | ICD-10-CM | POA: Diagnosis not present

## 2023-01-06 DIAGNOSIS — R2681 Unsteadiness on feet: Secondary | ICD-10-CM | POA: Diagnosis not present

## 2023-01-06 DIAGNOSIS — R2689 Other abnormalities of gait and mobility: Secondary | ICD-10-CM | POA: Diagnosis not present

## 2023-01-06 DIAGNOSIS — N183 Chronic kidney disease, stage 3 unspecified: Secondary | ICD-10-CM | POA: Diagnosis not present

## 2023-01-06 DIAGNOSIS — E1122 Type 2 diabetes mellitus with diabetic chronic kidney disease: Secondary | ICD-10-CM | POA: Diagnosis not present

## 2023-01-06 DIAGNOSIS — R269 Unspecified abnormalities of gait and mobility: Secondary | ICD-10-CM | POA: Diagnosis not present

## 2023-01-07 DIAGNOSIS — I10 Essential (primary) hypertension: Secondary | ICD-10-CM | POA: Diagnosis not present

## 2023-01-07 DIAGNOSIS — R262 Difficulty in walking, not elsewhere classified: Secondary | ICD-10-CM | POA: Diagnosis not present

## 2023-01-07 DIAGNOSIS — R296 Repeated falls: Secondary | ICD-10-CM | POA: Diagnosis not present

## 2023-01-07 DIAGNOSIS — E1122 Type 2 diabetes mellitus with diabetic chronic kidney disease: Secondary | ICD-10-CM | POA: Diagnosis not present

## 2023-01-07 DIAGNOSIS — M6281 Muscle weakness (generalized): Secondary | ICD-10-CM | POA: Diagnosis not present

## 2023-01-08 DIAGNOSIS — R296 Repeated falls: Secondary | ICD-10-CM | POA: Diagnosis not present

## 2023-01-08 DIAGNOSIS — M6281 Muscle weakness (generalized): Secondary | ICD-10-CM | POA: Diagnosis not present

## 2023-01-08 DIAGNOSIS — R262 Difficulty in walking, not elsewhere classified: Secondary | ICD-10-CM | POA: Diagnosis not present

## 2023-01-08 DIAGNOSIS — E1122 Type 2 diabetes mellitus with diabetic chronic kidney disease: Secondary | ICD-10-CM | POA: Diagnosis not present

## 2023-01-08 DIAGNOSIS — I10 Essential (primary) hypertension: Secondary | ICD-10-CM | POA: Diagnosis not present

## 2023-01-09 DIAGNOSIS — R296 Repeated falls: Secondary | ICD-10-CM | POA: Diagnosis not present

## 2023-01-09 DIAGNOSIS — I10 Essential (primary) hypertension: Secondary | ICD-10-CM | POA: Diagnosis not present

## 2023-01-09 DIAGNOSIS — R531 Weakness: Secondary | ICD-10-CM | POA: Diagnosis not present

## 2023-01-09 DIAGNOSIS — E1122 Type 2 diabetes mellitus with diabetic chronic kidney disease: Secondary | ICD-10-CM | POA: Diagnosis not present

## 2023-01-09 DIAGNOSIS — M1711 Unilateral primary osteoarthritis, right knee: Secondary | ICD-10-CM | POA: Diagnosis not present

## 2023-01-09 DIAGNOSIS — R2681 Unsteadiness on feet: Secondary | ICD-10-CM | POA: Diagnosis not present

## 2023-01-09 DIAGNOSIS — Z6837 Body mass index (BMI) 37.0-37.9, adult: Secondary | ICD-10-CM | POA: Diagnosis not present

## 2023-01-09 DIAGNOSIS — R030 Elevated blood-pressure reading, without diagnosis of hypertension: Secondary | ICD-10-CM | POA: Diagnosis not present

## 2023-01-09 DIAGNOSIS — N183 Chronic kidney disease, stage 3 unspecified: Secondary | ICD-10-CM | POA: Diagnosis not present

## 2023-01-09 DIAGNOSIS — R2689 Other abnormalities of gait and mobility: Secondary | ICD-10-CM | POA: Diagnosis not present

## 2023-01-09 DIAGNOSIS — M1712 Unilateral primary osteoarthritis, left knee: Secondary | ICD-10-CM | POA: Diagnosis not present

## 2023-01-09 DIAGNOSIS — R269 Unspecified abnormalities of gait and mobility: Secondary | ICD-10-CM | POA: Diagnosis not present

## 2023-01-12 DIAGNOSIS — E1122 Type 2 diabetes mellitus with diabetic chronic kidney disease: Secondary | ICD-10-CM | POA: Diagnosis not present

## 2023-01-12 DIAGNOSIS — R531 Weakness: Secondary | ICD-10-CM | POA: Diagnosis not present

## 2023-01-12 DIAGNOSIS — I10 Essential (primary) hypertension: Secondary | ICD-10-CM | POA: Diagnosis not present

## 2023-01-12 DIAGNOSIS — R2689 Other abnormalities of gait and mobility: Secondary | ICD-10-CM | POA: Diagnosis not present

## 2023-01-12 DIAGNOSIS — R296 Repeated falls: Secondary | ICD-10-CM | POA: Diagnosis not present

## 2023-01-12 DIAGNOSIS — N183 Chronic kidney disease, stage 3 unspecified: Secondary | ICD-10-CM | POA: Diagnosis not present

## 2023-01-12 DIAGNOSIS — R269 Unspecified abnormalities of gait and mobility: Secondary | ICD-10-CM | POA: Diagnosis not present

## 2023-01-12 DIAGNOSIS — R2681 Unsteadiness on feet: Secondary | ICD-10-CM | POA: Diagnosis not present

## 2023-01-13 DIAGNOSIS — R296 Repeated falls: Secondary | ICD-10-CM | POA: Diagnosis not present

## 2023-01-13 DIAGNOSIS — I10 Essential (primary) hypertension: Secondary | ICD-10-CM | POA: Diagnosis not present

## 2023-01-13 DIAGNOSIS — R269 Unspecified abnormalities of gait and mobility: Secondary | ICD-10-CM | POA: Diagnosis not present

## 2023-01-13 DIAGNOSIS — R2681 Unsteadiness on feet: Secondary | ICD-10-CM | POA: Diagnosis not present

## 2023-01-13 DIAGNOSIS — N183 Chronic kidney disease, stage 3 unspecified: Secondary | ICD-10-CM | POA: Diagnosis not present

## 2023-01-13 DIAGNOSIS — R531 Weakness: Secondary | ICD-10-CM | POA: Diagnosis not present

## 2023-01-13 DIAGNOSIS — E1122 Type 2 diabetes mellitus with diabetic chronic kidney disease: Secondary | ICD-10-CM | POA: Diagnosis not present

## 2023-01-13 DIAGNOSIS — R2689 Other abnormalities of gait and mobility: Secondary | ICD-10-CM | POA: Diagnosis not present

## 2023-01-14 DIAGNOSIS — R262 Difficulty in walking, not elsewhere classified: Secondary | ICD-10-CM | POA: Diagnosis not present

## 2023-01-14 DIAGNOSIS — R296 Repeated falls: Secondary | ICD-10-CM | POA: Diagnosis not present

## 2023-01-14 DIAGNOSIS — I10 Essential (primary) hypertension: Secondary | ICD-10-CM | POA: Diagnosis not present

## 2023-01-14 DIAGNOSIS — M6281 Muscle weakness (generalized): Secondary | ICD-10-CM | POA: Diagnosis not present

## 2023-01-14 DIAGNOSIS — E1122 Type 2 diabetes mellitus with diabetic chronic kidney disease: Secondary | ICD-10-CM | POA: Diagnosis not present

## 2023-01-15 DIAGNOSIS — R296 Repeated falls: Secondary | ICD-10-CM | POA: Diagnosis not present

## 2023-01-15 DIAGNOSIS — M6281 Muscle weakness (generalized): Secondary | ICD-10-CM | POA: Diagnosis not present

## 2023-01-15 DIAGNOSIS — R262 Difficulty in walking, not elsewhere classified: Secondary | ICD-10-CM | POA: Diagnosis not present

## 2023-01-15 DIAGNOSIS — I10 Essential (primary) hypertension: Secondary | ICD-10-CM | POA: Diagnosis not present

## 2023-01-15 DIAGNOSIS — E1122 Type 2 diabetes mellitus with diabetic chronic kidney disease: Secondary | ICD-10-CM | POA: Diagnosis not present

## 2023-01-16 DIAGNOSIS — R296 Repeated falls: Secondary | ICD-10-CM | POA: Diagnosis not present

## 2023-01-16 DIAGNOSIS — R269 Unspecified abnormalities of gait and mobility: Secondary | ICD-10-CM | POA: Diagnosis not present

## 2023-01-16 DIAGNOSIS — R2681 Unsteadiness on feet: Secondary | ICD-10-CM | POA: Diagnosis not present

## 2023-01-16 DIAGNOSIS — N183 Chronic kidney disease, stage 3 unspecified: Secondary | ICD-10-CM | POA: Diagnosis not present

## 2023-01-16 DIAGNOSIS — R531 Weakness: Secondary | ICD-10-CM | POA: Diagnosis not present

## 2023-01-16 DIAGNOSIS — E1122 Type 2 diabetes mellitus with diabetic chronic kidney disease: Secondary | ICD-10-CM | POA: Diagnosis not present

## 2023-01-16 DIAGNOSIS — I10 Essential (primary) hypertension: Secondary | ICD-10-CM | POA: Diagnosis not present

## 2023-01-16 DIAGNOSIS — R2689 Other abnormalities of gait and mobility: Secondary | ICD-10-CM | POA: Diagnosis not present

## 2023-01-18 DIAGNOSIS — Z993 Dependence on wheelchair: Secondary | ICD-10-CM | POA: Diagnosis not present

## 2023-01-18 DIAGNOSIS — E669 Obesity, unspecified: Secondary | ICD-10-CM | POA: Diagnosis not present

## 2023-01-18 DIAGNOSIS — E039 Hypothyroidism, unspecified: Secondary | ICD-10-CM | POA: Diagnosis not present

## 2023-01-18 DIAGNOSIS — Z7984 Long term (current) use of oral hypoglycemic drugs: Secondary | ICD-10-CM | POA: Diagnosis not present

## 2023-01-18 DIAGNOSIS — M810 Age-related osteoporosis without current pathological fracture: Secondary | ICD-10-CM | POA: Diagnosis not present

## 2023-01-18 DIAGNOSIS — M170 Bilateral primary osteoarthritis of knee: Secondary | ICD-10-CM | POA: Diagnosis not present

## 2023-01-18 DIAGNOSIS — E1142 Type 2 diabetes mellitus with diabetic polyneuropathy: Secondary | ICD-10-CM | POA: Diagnosis not present

## 2023-01-18 DIAGNOSIS — E1122 Type 2 diabetes mellitus with diabetic chronic kidney disease: Secondary | ICD-10-CM | POA: Diagnosis not present

## 2023-01-18 DIAGNOSIS — Z6831 Body mass index (BMI) 31.0-31.9, adult: Secondary | ICD-10-CM | POA: Diagnosis not present

## 2023-01-18 DIAGNOSIS — G4733 Obstructive sleep apnea (adult) (pediatric): Secondary | ICD-10-CM | POA: Diagnosis not present

## 2023-01-18 DIAGNOSIS — K219 Gastro-esophageal reflux disease without esophagitis: Secondary | ICD-10-CM | POA: Diagnosis not present

## 2023-01-18 DIAGNOSIS — Z794 Long term (current) use of insulin: Secondary | ICD-10-CM | POA: Diagnosis not present

## 2023-01-18 DIAGNOSIS — L89892 Pressure ulcer of other site, stage 2: Secondary | ICD-10-CM | POA: Diagnosis not present

## 2023-01-18 DIAGNOSIS — Z9181 History of falling: Secondary | ICD-10-CM | POA: Diagnosis not present

## 2023-01-18 DIAGNOSIS — N183 Chronic kidney disease, stage 3 unspecified: Secondary | ICD-10-CM | POA: Diagnosis not present

## 2023-01-18 DIAGNOSIS — M5136 Other intervertebral disc degeneration, lumbar region: Secondary | ICD-10-CM | POA: Diagnosis not present

## 2023-01-18 DIAGNOSIS — I10 Essential (primary) hypertension: Secondary | ICD-10-CM | POA: Diagnosis not present

## 2023-01-22 DIAGNOSIS — M170 Bilateral primary osteoarthritis of knee: Secondary | ICD-10-CM | POA: Diagnosis not present

## 2023-01-22 DIAGNOSIS — E1122 Type 2 diabetes mellitus with diabetic chronic kidney disease: Secondary | ICD-10-CM | POA: Diagnosis not present

## 2023-01-22 DIAGNOSIS — E1142 Type 2 diabetes mellitus with diabetic polyneuropathy: Secondary | ICD-10-CM | POA: Diagnosis not present

## 2023-01-22 DIAGNOSIS — I10 Essential (primary) hypertension: Secondary | ICD-10-CM | POA: Diagnosis not present

## 2023-01-22 DIAGNOSIS — L89892 Pressure ulcer of other site, stage 2: Secondary | ICD-10-CM | POA: Diagnosis not present

## 2023-01-22 DIAGNOSIS — N183 Chronic kidney disease, stage 3 unspecified: Secondary | ICD-10-CM | POA: Diagnosis not present

## 2023-01-26 DIAGNOSIS — E1122 Type 2 diabetes mellitus with diabetic chronic kidney disease: Secondary | ICD-10-CM | POA: Diagnosis not present

## 2023-01-26 DIAGNOSIS — M170 Bilateral primary osteoarthritis of knee: Secondary | ICD-10-CM | POA: Diagnosis not present

## 2023-01-26 DIAGNOSIS — L89892 Pressure ulcer of other site, stage 2: Secondary | ICD-10-CM | POA: Diagnosis not present

## 2023-01-26 DIAGNOSIS — I10 Essential (primary) hypertension: Secondary | ICD-10-CM | POA: Diagnosis not present

## 2023-01-26 DIAGNOSIS — E1142 Type 2 diabetes mellitus with diabetic polyneuropathy: Secondary | ICD-10-CM | POA: Diagnosis not present

## 2023-01-26 DIAGNOSIS — N183 Chronic kidney disease, stage 3 unspecified: Secondary | ICD-10-CM | POA: Diagnosis not present

## 2023-01-27 DIAGNOSIS — R1111 Vomiting without nausea: Secondary | ICD-10-CM | POA: Diagnosis not present

## 2023-01-27 DIAGNOSIS — Z882 Allergy status to sulfonamides status: Secondary | ICD-10-CM | POA: Diagnosis not present

## 2023-01-27 DIAGNOSIS — R112 Nausea with vomiting, unspecified: Secondary | ICD-10-CM | POA: Diagnosis not present

## 2023-01-27 DIAGNOSIS — E079 Disorder of thyroid, unspecified: Secondary | ICD-10-CM | POA: Diagnosis not present

## 2023-01-27 DIAGNOSIS — R1032 Left lower quadrant pain: Secondary | ICD-10-CM | POA: Diagnosis not present

## 2023-01-27 DIAGNOSIS — Z9049 Acquired absence of other specified parts of digestive tract: Secondary | ICD-10-CM | POA: Diagnosis not present

## 2023-01-27 DIAGNOSIS — N281 Cyst of kidney, acquired: Secondary | ICD-10-CM | POA: Diagnosis not present

## 2023-01-27 DIAGNOSIS — Z7984 Long term (current) use of oral hypoglycemic drugs: Secondary | ICD-10-CM | POA: Diagnosis not present

## 2023-01-27 DIAGNOSIS — R110 Nausea: Secondary | ICD-10-CM | POA: Diagnosis not present

## 2023-01-27 DIAGNOSIS — R109 Unspecified abdominal pain: Secondary | ICD-10-CM | POA: Diagnosis not present

## 2023-01-27 DIAGNOSIS — E279 Disorder of adrenal gland, unspecified: Secondary | ICD-10-CM | POA: Diagnosis not present

## 2023-01-27 DIAGNOSIS — Z79899 Other long term (current) drug therapy: Secondary | ICD-10-CM | POA: Diagnosis not present

## 2023-01-27 DIAGNOSIS — N200 Calculus of kidney: Secondary | ICD-10-CM | POA: Diagnosis not present

## 2023-01-27 DIAGNOSIS — I10 Essential (primary) hypertension: Secondary | ICD-10-CM | POA: Diagnosis not present

## 2023-01-27 DIAGNOSIS — R1030 Lower abdominal pain, unspecified: Secondary | ICD-10-CM | POA: Diagnosis not present

## 2023-01-27 DIAGNOSIS — Z6838 Body mass index (BMI) 38.0-38.9, adult: Secondary | ICD-10-CM | POA: Diagnosis not present

## 2023-01-27 DIAGNOSIS — R0902 Hypoxemia: Secondary | ICD-10-CM | POA: Diagnosis not present

## 2023-01-27 DIAGNOSIS — Z794 Long term (current) use of insulin: Secondary | ICD-10-CM | POA: Diagnosis not present

## 2023-01-27 DIAGNOSIS — Z87891 Personal history of nicotine dependence: Secondary | ICD-10-CM | POA: Diagnosis not present

## 2023-01-27 DIAGNOSIS — R1084 Generalized abdominal pain: Secondary | ICD-10-CM | POA: Diagnosis not present

## 2023-01-27 DIAGNOSIS — R000 Tachycardia, unspecified: Secondary | ICD-10-CM | POA: Diagnosis not present

## 2023-01-27 DIAGNOSIS — E1140 Type 2 diabetes mellitus with diabetic neuropathy, unspecified: Secondary | ICD-10-CM | POA: Diagnosis not present

## 2023-01-29 DIAGNOSIS — M170 Bilateral primary osteoarthritis of knee: Secondary | ICD-10-CM | POA: Diagnosis not present

## 2023-01-29 DIAGNOSIS — N183 Chronic kidney disease, stage 3 unspecified: Secondary | ICD-10-CM | POA: Diagnosis not present

## 2023-01-29 DIAGNOSIS — L89892 Pressure ulcer of other site, stage 2: Secondary | ICD-10-CM | POA: Diagnosis not present

## 2023-01-29 DIAGNOSIS — E1122 Type 2 diabetes mellitus with diabetic chronic kidney disease: Secondary | ICD-10-CM | POA: Diagnosis not present

## 2023-01-29 DIAGNOSIS — I10 Essential (primary) hypertension: Secondary | ICD-10-CM | POA: Diagnosis not present

## 2023-01-29 DIAGNOSIS — E1142 Type 2 diabetes mellitus with diabetic polyneuropathy: Secondary | ICD-10-CM | POA: Diagnosis not present

## 2023-01-30 DIAGNOSIS — M170 Bilateral primary osteoarthritis of knee: Secondary | ICD-10-CM | POA: Diagnosis not present

## 2023-01-30 DIAGNOSIS — L89892 Pressure ulcer of other site, stage 2: Secondary | ICD-10-CM | POA: Diagnosis not present

## 2023-01-30 DIAGNOSIS — I10 Essential (primary) hypertension: Secondary | ICD-10-CM | POA: Diagnosis not present

## 2023-01-30 DIAGNOSIS — N183 Chronic kidney disease, stage 3 unspecified: Secondary | ICD-10-CM | POA: Diagnosis not present

## 2023-01-30 DIAGNOSIS — E1122 Type 2 diabetes mellitus with diabetic chronic kidney disease: Secondary | ICD-10-CM | POA: Diagnosis not present

## 2023-01-30 DIAGNOSIS — E1142 Type 2 diabetes mellitus with diabetic polyneuropathy: Secondary | ICD-10-CM | POA: Diagnosis not present

## 2023-02-02 DIAGNOSIS — E1142 Type 2 diabetes mellitus with diabetic polyneuropathy: Secondary | ICD-10-CM | POA: Diagnosis not present

## 2023-02-02 DIAGNOSIS — I10 Essential (primary) hypertension: Secondary | ICD-10-CM | POA: Diagnosis not present

## 2023-02-02 DIAGNOSIS — L89892 Pressure ulcer of other site, stage 2: Secondary | ICD-10-CM | POA: Diagnosis not present

## 2023-02-02 DIAGNOSIS — E1122 Type 2 diabetes mellitus with diabetic chronic kidney disease: Secondary | ICD-10-CM | POA: Diagnosis not present

## 2023-02-02 DIAGNOSIS — M170 Bilateral primary osteoarthritis of knee: Secondary | ICD-10-CM | POA: Diagnosis not present

## 2023-02-02 DIAGNOSIS — N183 Chronic kidney disease, stage 3 unspecified: Secondary | ICD-10-CM | POA: Diagnosis not present

## 2023-02-06 DIAGNOSIS — N183 Chronic kidney disease, stage 3 unspecified: Secondary | ICD-10-CM | POA: Diagnosis not present

## 2023-02-06 DIAGNOSIS — L89892 Pressure ulcer of other site, stage 2: Secondary | ICD-10-CM | POA: Diagnosis not present

## 2023-02-06 DIAGNOSIS — E1122 Type 2 diabetes mellitus with diabetic chronic kidney disease: Secondary | ICD-10-CM | POA: Diagnosis not present

## 2023-02-06 DIAGNOSIS — E1142 Type 2 diabetes mellitus with diabetic polyneuropathy: Secondary | ICD-10-CM | POA: Diagnosis not present

## 2023-02-06 DIAGNOSIS — I10 Essential (primary) hypertension: Secondary | ICD-10-CM | POA: Diagnosis not present

## 2023-02-06 DIAGNOSIS — M170 Bilateral primary osteoarthritis of knee: Secondary | ICD-10-CM | POA: Diagnosis not present

## 2023-02-09 DIAGNOSIS — E1142 Type 2 diabetes mellitus with diabetic polyneuropathy: Secondary | ICD-10-CM | POA: Diagnosis not present

## 2023-02-09 DIAGNOSIS — E1122 Type 2 diabetes mellitus with diabetic chronic kidney disease: Secondary | ICD-10-CM | POA: Diagnosis not present

## 2023-02-09 DIAGNOSIS — L97529 Non-pressure chronic ulcer of other part of left foot with unspecified severity: Secondary | ICD-10-CM | POA: Diagnosis not present

## 2023-02-09 DIAGNOSIS — N183 Chronic kidney disease, stage 3 unspecified: Secondary | ICD-10-CM | POA: Diagnosis not present

## 2023-02-09 DIAGNOSIS — I10 Essential (primary) hypertension: Secondary | ICD-10-CM | POA: Diagnosis not present

## 2023-02-09 DIAGNOSIS — L89892 Pressure ulcer of other site, stage 2: Secondary | ICD-10-CM | POA: Diagnosis not present

## 2023-02-09 DIAGNOSIS — L97512 Non-pressure chronic ulcer of other part of right foot with fat layer exposed: Secondary | ICD-10-CM | POA: Diagnosis not present

## 2023-02-09 DIAGNOSIS — M170 Bilateral primary osteoarthritis of knee: Secondary | ICD-10-CM | POA: Diagnosis not present

## 2023-02-13 DIAGNOSIS — E1142 Type 2 diabetes mellitus with diabetic polyneuropathy: Secondary | ICD-10-CM | POA: Diagnosis not present

## 2023-02-13 DIAGNOSIS — M170 Bilateral primary osteoarthritis of knee: Secondary | ICD-10-CM | POA: Diagnosis not present

## 2023-02-13 DIAGNOSIS — N183 Chronic kidney disease, stage 3 unspecified: Secondary | ICD-10-CM | POA: Diagnosis not present

## 2023-02-13 DIAGNOSIS — I10 Essential (primary) hypertension: Secondary | ICD-10-CM | POA: Diagnosis not present

## 2023-02-13 DIAGNOSIS — L89892 Pressure ulcer of other site, stage 2: Secondary | ICD-10-CM | POA: Diagnosis not present

## 2023-02-13 DIAGNOSIS — E1122 Type 2 diabetes mellitus with diabetic chronic kidney disease: Secondary | ICD-10-CM | POA: Diagnosis not present

## 2023-02-16 DIAGNOSIS — N183 Chronic kidney disease, stage 3 unspecified: Secondary | ICD-10-CM | POA: Diagnosis not present

## 2023-02-16 DIAGNOSIS — M170 Bilateral primary osteoarthritis of knee: Secondary | ICD-10-CM | POA: Diagnosis not present

## 2023-02-16 DIAGNOSIS — L89892 Pressure ulcer of other site, stage 2: Secondary | ICD-10-CM | POA: Diagnosis not present

## 2023-02-16 DIAGNOSIS — E1122 Type 2 diabetes mellitus with diabetic chronic kidney disease: Secondary | ICD-10-CM | POA: Diagnosis not present

## 2023-02-16 DIAGNOSIS — E1142 Type 2 diabetes mellitus with diabetic polyneuropathy: Secondary | ICD-10-CM | POA: Diagnosis not present

## 2023-02-16 DIAGNOSIS — I10 Essential (primary) hypertension: Secondary | ICD-10-CM | POA: Diagnosis not present

## 2023-02-17 DIAGNOSIS — I10 Essential (primary) hypertension: Secondary | ICD-10-CM | POA: Diagnosis not present

## 2023-02-17 DIAGNOSIS — Z7984 Long term (current) use of oral hypoglycemic drugs: Secondary | ICD-10-CM | POA: Diagnosis not present

## 2023-02-17 DIAGNOSIS — M810 Age-related osteoporosis without current pathological fracture: Secondary | ICD-10-CM | POA: Diagnosis not present

## 2023-02-17 DIAGNOSIS — G4733 Obstructive sleep apnea (adult) (pediatric): Secondary | ICD-10-CM | POA: Diagnosis not present

## 2023-02-17 DIAGNOSIS — Z6831 Body mass index (BMI) 31.0-31.9, adult: Secondary | ICD-10-CM | POA: Diagnosis not present

## 2023-02-17 DIAGNOSIS — M5136 Other intervertebral disc degeneration, lumbar region: Secondary | ICD-10-CM | POA: Diagnosis not present

## 2023-02-17 DIAGNOSIS — Z794 Long term (current) use of insulin: Secondary | ICD-10-CM | POA: Diagnosis not present

## 2023-02-17 DIAGNOSIS — E669 Obesity, unspecified: Secondary | ICD-10-CM | POA: Diagnosis not present

## 2023-02-17 DIAGNOSIS — L89892 Pressure ulcer of other site, stage 2: Secondary | ICD-10-CM | POA: Diagnosis not present

## 2023-02-17 DIAGNOSIS — Z9181 History of falling: Secondary | ICD-10-CM | POA: Diagnosis not present

## 2023-02-17 DIAGNOSIS — Z993 Dependence on wheelchair: Secondary | ICD-10-CM | POA: Diagnosis not present

## 2023-02-17 DIAGNOSIS — M170 Bilateral primary osteoarthritis of knee: Secondary | ICD-10-CM | POA: Diagnosis not present

## 2023-02-17 DIAGNOSIS — E1122 Type 2 diabetes mellitus with diabetic chronic kidney disease: Secondary | ICD-10-CM | POA: Diagnosis not present

## 2023-02-17 DIAGNOSIS — K219 Gastro-esophageal reflux disease without esophagitis: Secondary | ICD-10-CM | POA: Diagnosis not present

## 2023-02-17 DIAGNOSIS — E1142 Type 2 diabetes mellitus with diabetic polyneuropathy: Secondary | ICD-10-CM | POA: Diagnosis not present

## 2023-02-17 DIAGNOSIS — E039 Hypothyroidism, unspecified: Secondary | ICD-10-CM | POA: Diagnosis not present

## 2023-02-17 DIAGNOSIS — N183 Chronic kidney disease, stage 3 unspecified: Secondary | ICD-10-CM | POA: Diagnosis not present

## 2023-02-19 DIAGNOSIS — M170 Bilateral primary osteoarthritis of knee: Secondary | ICD-10-CM | POA: Diagnosis not present

## 2023-02-19 DIAGNOSIS — M810 Age-related osteoporosis without current pathological fracture: Secondary | ICD-10-CM | POA: Diagnosis not present

## 2023-02-19 DIAGNOSIS — E1142 Type 2 diabetes mellitus with diabetic polyneuropathy: Secondary | ICD-10-CM | POA: Diagnosis not present

## 2023-02-19 DIAGNOSIS — E039 Hypothyroidism, unspecified: Secondary | ICD-10-CM | POA: Diagnosis not present

## 2023-02-19 DIAGNOSIS — N183 Chronic kidney disease, stage 3 unspecified: Secondary | ICD-10-CM | POA: Diagnosis not present

## 2023-02-19 DIAGNOSIS — L89892 Pressure ulcer of other site, stage 2: Secondary | ICD-10-CM | POA: Diagnosis not present

## 2023-02-19 DIAGNOSIS — E1122 Type 2 diabetes mellitus with diabetic chronic kidney disease: Secondary | ICD-10-CM | POA: Diagnosis not present

## 2023-02-19 DIAGNOSIS — I10 Essential (primary) hypertension: Secondary | ICD-10-CM | POA: Diagnosis not present

## 2023-02-20 DIAGNOSIS — N183 Chronic kidney disease, stage 3 unspecified: Secondary | ICD-10-CM | POA: Diagnosis not present

## 2023-02-20 DIAGNOSIS — I10 Essential (primary) hypertension: Secondary | ICD-10-CM | POA: Diagnosis not present

## 2023-02-20 DIAGNOSIS — E1122 Type 2 diabetes mellitus with diabetic chronic kidney disease: Secondary | ICD-10-CM | POA: Diagnosis not present

## 2023-02-20 DIAGNOSIS — E1142 Type 2 diabetes mellitus with diabetic polyneuropathy: Secondary | ICD-10-CM | POA: Diagnosis not present

## 2023-02-20 DIAGNOSIS — L89892 Pressure ulcer of other site, stage 2: Secondary | ICD-10-CM | POA: Diagnosis not present

## 2023-02-20 DIAGNOSIS — M170 Bilateral primary osteoarthritis of knee: Secondary | ICD-10-CM | POA: Diagnosis not present

## 2023-02-24 DIAGNOSIS — I10 Essential (primary) hypertension: Secondary | ICD-10-CM | POA: Diagnosis not present

## 2023-02-24 DIAGNOSIS — M170 Bilateral primary osteoarthritis of knee: Secondary | ICD-10-CM | POA: Diagnosis not present

## 2023-02-24 DIAGNOSIS — E1142 Type 2 diabetes mellitus with diabetic polyneuropathy: Secondary | ICD-10-CM | POA: Diagnosis not present

## 2023-02-24 DIAGNOSIS — N183 Chronic kidney disease, stage 3 unspecified: Secondary | ICD-10-CM | POA: Diagnosis not present

## 2023-02-24 DIAGNOSIS — E1122 Type 2 diabetes mellitus with diabetic chronic kidney disease: Secondary | ICD-10-CM | POA: Diagnosis not present

## 2023-02-24 DIAGNOSIS — L89892 Pressure ulcer of other site, stage 2: Secondary | ICD-10-CM | POA: Diagnosis not present

## 2023-03-02 DIAGNOSIS — N183 Chronic kidney disease, stage 3 unspecified: Secondary | ICD-10-CM | POA: Diagnosis not present

## 2023-03-02 DIAGNOSIS — I10 Essential (primary) hypertension: Secondary | ICD-10-CM | POA: Diagnosis not present

## 2023-03-02 DIAGNOSIS — E1142 Type 2 diabetes mellitus with diabetic polyneuropathy: Secondary | ICD-10-CM | POA: Diagnosis not present

## 2023-03-02 DIAGNOSIS — M170 Bilateral primary osteoarthritis of knee: Secondary | ICD-10-CM | POA: Diagnosis not present

## 2023-03-02 DIAGNOSIS — L89892 Pressure ulcer of other site, stage 2: Secondary | ICD-10-CM | POA: Diagnosis not present

## 2023-03-02 DIAGNOSIS — E1122 Type 2 diabetes mellitus with diabetic chronic kidney disease: Secondary | ICD-10-CM | POA: Diagnosis not present

## 2023-03-06 DIAGNOSIS — I10 Essential (primary) hypertension: Secondary | ICD-10-CM | POA: Diagnosis not present

## 2023-03-06 DIAGNOSIS — E1142 Type 2 diabetes mellitus with diabetic polyneuropathy: Secondary | ICD-10-CM | POA: Diagnosis not present

## 2023-03-06 DIAGNOSIS — N183 Chronic kidney disease, stage 3 unspecified: Secondary | ICD-10-CM | POA: Diagnosis not present

## 2023-03-06 DIAGNOSIS — L89892 Pressure ulcer of other site, stage 2: Secondary | ICD-10-CM | POA: Diagnosis not present

## 2023-03-06 DIAGNOSIS — E1122 Type 2 diabetes mellitus with diabetic chronic kidney disease: Secondary | ICD-10-CM | POA: Diagnosis not present

## 2023-03-06 DIAGNOSIS — M170 Bilateral primary osteoarthritis of knee: Secondary | ICD-10-CM | POA: Diagnosis not present

## 2023-03-07 ENCOUNTER — Encounter (HOSPITAL_COMMUNITY): Payer: Self-pay

## 2023-03-07 ENCOUNTER — Emergency Department (HOSPITAL_COMMUNITY): Payer: Medicare Other

## 2023-03-07 ENCOUNTER — Other Ambulatory Visit: Payer: Self-pay

## 2023-03-07 ENCOUNTER — Emergency Department (HOSPITAL_COMMUNITY)
Admission: EM | Admit: 2023-03-07 | Discharge: 2023-03-07 | Disposition: A | Discharge status: Home / Self Care | Payer: Medicare Other | Attending: Emergency Medicine | Admitting: Emergency Medicine

## 2023-03-07 DIAGNOSIS — I10 Essential (primary) hypertension: Secondary | ICD-10-CM | POA: Insufficient documentation

## 2023-03-07 DIAGNOSIS — S3992XA Unspecified injury of lower back, initial encounter: Secondary | ICD-10-CM | POA: Insufficient documentation

## 2023-03-07 DIAGNOSIS — E119 Type 2 diabetes mellitus without complications: Secondary | ICD-10-CM | POA: Insufficient documentation

## 2023-03-07 DIAGNOSIS — Z79899 Other long term (current) drug therapy: Secondary | ICD-10-CM | POA: Insufficient documentation

## 2023-03-07 DIAGNOSIS — N949 Unspecified condition associated with female genital organs and menstrual cycle: Secondary | ICD-10-CM | POA: Diagnosis not present

## 2023-03-07 DIAGNOSIS — N3001 Acute cystitis with hematuria: Secondary | ICD-10-CM | POA: Diagnosis not present

## 2023-03-07 DIAGNOSIS — R2241 Localized swelling, mass and lump, right lower limb: Secondary | ICD-10-CM | POA: Diagnosis not present

## 2023-03-07 DIAGNOSIS — W010XXA Fall on same level from slipping, tripping and stumbling without subsequent striking against object, initial encounter: Secondary | ICD-10-CM | POA: Insufficient documentation

## 2023-03-07 DIAGNOSIS — M8588 Other specified disorders of bone density and structure, other site: Secondary | ICD-10-CM | POA: Diagnosis not present

## 2023-03-07 DIAGNOSIS — W19XXXA Unspecified fall, initial encounter: Secondary | ICD-10-CM | POA: Diagnosis not present

## 2023-03-07 DIAGNOSIS — R58 Hemorrhage, not elsewhere classified: Secondary | ICD-10-CM | POA: Diagnosis not present

## 2023-03-07 DIAGNOSIS — R2242 Localized swelling, mass and lump, left lower limb: Secondary | ICD-10-CM | POA: Insufficient documentation

## 2023-03-07 DIAGNOSIS — Z043 Encounter for examination and observation following other accident: Secondary | ICD-10-CM | POA: Diagnosis not present

## 2023-03-07 LAB — URINALYSIS, ROUTINE W REFLEX MICROSCOPIC
Bacteria, UA: NONE SEEN
Bilirubin Urine: NEGATIVE
Glucose, UA: NEGATIVE mg/dL
Ketones, ur: NEGATIVE mg/dL
Nitrite: NEGATIVE
Protein, ur: NEGATIVE mg/dL
RBC / HPF: >50 RBC/hpf (ref 0–5)
Specific Gravity, Urine: 1.015 (ref 1.005–1.030)
pH: 8 (ref 5.0–8.0)

## 2023-03-07 MED ORDER — ACETAMINOPHEN 500 MG PO TABS: 1000.0000 mg | ORAL_TABLET | Freq: Once | ORAL | Status: DC

## 2023-03-07 MED ORDER — CEPHALEXIN 500 MG PO CAPS: 500.0000 mg | ORAL_CAPSULE | Freq: Four times a day (QID) | ORAL | 0 refills | Status: DC

## 2023-03-07 NOTE — ED Notes (Signed)
Pt awaiting transport back to facility.

## 2023-03-07 NOTE — ED Triage Notes (Signed)
Pt having no bleeding at present from anywhere

## 2023-03-07 NOTE — Discharge Instructions (Signed)
Echo today was reassuring overall.  Follow-up with your gynecologist if you have any vaginal bleeding.  You do have a UTI with some blood in the urine, take the antibiotic as prescribed.  Return to the ED for new or concerning symptoms.

## 2023-03-07 NOTE — ED Provider Notes (Signed)
[facility] Provider Note   CSN: [id number] Arrival date & time: 03/07/23  1326     History  Chief Complaint  Patient presents with   Lytle Michaels    Kathryn Ryan is a 82 y.o. female.   Fall     Patient is an 82 year old female with medical history of hypertension, hyperlipidemia, hyperglycemia, diabetes presenting to the emergency department due to fall.  Patient dates it was a mechanical fall, she tripped fall "backing up" in her kitchen.  She did not hit her head or lose consciousness.  She landed on her bottom, she noticed blood in between her legs like a streak of it and was concerned about vaginal bleeding.  She is having pain mostly to the lower back, no pain elsewhere.  Denies any saddle anesthesia, loss of consciousness, urinary tension, fecal incontinence, dysuria.  There is a small drop of red blood in her depends, she also had a streak of blood on her right thigh.  Not on any blood thinners.  Ambulatory, moving upper and lower extremities and denies any pain in those locations.  Home Medications Prior to Admission medications   Medication Sig Start Date End Date Taking? Authorizing Provider  atorvastatin (LIPITOR) 10 MG tablet Take 10 mg by mouth every morning.   Yes [provider]  cephALEXin (KEFLEX) 500 MG capsule Take 1 capsule (500 mg total) by mouth 4 (four) times daily. 03/07/23  Yes Sherrill Raring, PA-C  cholecalciferol (VITAMIN D3) 25 MCG (1000 UNIT) tablet Take 2,000 Units by mouth every morning.   Yes [provider]  Cranberry 450 MG CAPS Take 450 mg by mouth every morning.   Yes [provider]  DULoxetine (CYMBALTA) 60 MG capsule Take 60 mg by mouth 2 (two) times daily.   Yes [provider]  Exenatide ER (BYDUREON) 2 MG PEN Inject 2 mg into the skin every Thursday.   Yes [provider]  HYDROcodone-acetaminophen (VICODIN) 5-325 mg TABS tablet Take 1 tablet by mouth at  bedtime.   Yes [provider]  insulin glargine (LANTUS) 100 UNIT/ML injection Inject 120 Units into the skin at bedtime.   Yes [provider]  levothyroxine (SYNTHROID, LEVOTHROID) 150 MCG tablet Take 150 mcg by mouth daily before breakfast.   Yes [provider]  Melatonin 12 MG TABS Take 12 mg by mouth at bedtime.   Yes [provider]  metFORMIN (GLUCOPHAGE-XR) 500 MG 24 hr tablet Take 500 mg by mouth at bedtime. 06/03/16 03/07/23 Yes [provider]  omeprazole (PRILOSEC) 20 MG capsule Take 20 mg by mouth every morning.   Yes [provider]  pregabalin (LYRICA) 200 MG capsule Take 200 mg by mouth 2 (two) times daily.   Yes [provider]  traZODone (DESYREL) 100 MG tablet Take 100 mg by mouth at bedtime.   Yes [provider]      Allergies    Statins and Sulfa antibiotics    Review of Systems   Review of Systems  Physical Exam Updated Vital Signs BP (!) 156/80   Pulse 93   Temp 98.8 F (37.1 C) (Oral)   Resp 18   Ht 5\' 6"  (1.676 m)   Wt 104.3 kg   SpO2 95%   BMI 37.12 kg/m  Physical Exam Vitals and nursing note reviewed. Exam conducted with a chaperone present.  Constitutional:      Appearance: Normal appearance. She is obese.  HENT:  Head: Normocephalic and atraumatic.  Eyes:     General: No scleral icterus.       Right eye: No discharge.        Left eye: No discharge.     Extraocular Movements: Extraocular movements intact.     Pupils: Pupils are equal, round, and reactive to light.  Cardiovascular:     Rate and Rhythm: Normal rate and regular rhythm.     Pulses: Normal pulses.     Heart sounds: Normal heart sounds. No murmur heard.    No friction rub. No gallop.  Pulmonary:     Effort: Pulmonary effort is normal. No respiratory distress.     Breath sounds: Normal breath sounds.  Abdominal:     General: Abdomen is flat. Bowel sounds are normal. There is no distension.     Palpations:  Abdomen is soft.     Tenderness: There is no abdominal tenderness.  Genitourinary:    Comments: Some contusions perirectally but no rectal bleeding.  Do not appreciate any open wounds to the back.  Pelvic exam performed with RN Donia Guiles as chaperone.  Cervix is intact, there is no laceration to the mucosa.  No adnexal pain.  No pooling in the vaginal vault. Musculoskeletal:     Right lower leg: Edema present.     Left lower leg: Edema present.     Comments: Moving upper lower extremity full strength without any difficulty or reproducible bony tenderness.  Bilateral lower extremity edema with venous stasis changes  Skin:    General: Skin is warm and dry.     Coloration: Skin is not jaundiced.     Comments: Abrasion to right anterior shin, less than 2 cm.  There is a streak of blood to the right thigh above this  Neurological:     Mental Status: She is alert. Mental status is at baseline.     Coordination: Coordination normal.     ED Results / Procedures / Treatments   Labs (all labs ordered are listed, but only abnormal results are displayed) Labs Reviewed  URINALYSIS, ROUTINE W REFLEX MICROSCOPIC - Abnormal; Notable for the following components:      Result Value   APPearance HAZY (*)    Hgb urine dipstick LARGE (*)    Leukocytes,Ua TRACE (*)    All other components within normal limits    EKG None  Radiology DG Chest 1 View  Result Date: 03/07/2023 CLINICAL DATA:  Status post fall. EXAM: CHEST  1 VIEW COMPARISON:  November 02, 2021 FINDINGS: The heart size and mediastinal contours are within normal limits. Very mild atelectatic changes are seen within the left lung base. There is no evidence of acute infiltrate, pleural effusion or pneumothorax. A radiopaque surgical pin is seen overlying the right humeral head. IMPRESSION: No acute cardiopulmonary disease. Electronically Signed   By: Virgina Norfolk M.D.   On: 03/07/2023 15:13   CT PELVIS WO CONTRAST  Result Date:  03/07/2023 CLINICAL DATA:  Status post fall. EXAM: CT PELVIS WITHOUT CONTRAST TECHNIQUE: Multidetector CT imaging of the pelvis was performed following the standard protocol without intravenous contrast. RADIATION DOSE REDUCTION: This exam was performed according to the departmental dose-optimization program which includes automated exposure control, adjustment of the mA and/or kV according to patient size and/or use of iterative reconstruction technique. COMPARISON:  January 27, 2023 FINDINGS: Urinary Tract: The urinary bladder is poorly distended and subsequently limited in evaluation. No abnormality visualized. Bowel:  Unremarkable visualized pelvic bowel loops. Vascular/Lymphatic: No  pathologically enlarged lymph nodes. There is marked severity calcification of the visualized portion of the infrarenal abdominal aorta and bilateral common iliac arteries, without evidence of aneurysmal dilatation. Reproductive: The uterus is surgically absent. A predominantly stable 3.3 cm x 2.3 cm cyst is seen along the posterior aspect of the right adnexa. The left adnexa is unremarkable. Other:  None. Musculoskeletal: There is no evidence of an acute fracture or dislocation. Postoperative changes and fusion hardware are seen throughout the visualized portion of the lower lumbar spine. IMPRESSION: 1. No evidence of an acute fracture or dislocation. 2. Postoperative changes and fusion hardware throughout the visualized portion of the lower lumbar spine. 3. Stable right adnexal cyst, likely ovarian in origin. No follow-up imaging is recommended. This recommendation follows ACR consensus guidelines: White Paper of the ACR Incidental Findings Committee II on Adnexal Findings. J Am Coll Radiol 2013:10:675-681. 4. Aortic atherosclerosis. Aortic Atherosclerosis (ICD10-I70.0). Electronically Signed   By: Virgina Norfolk M.D.   On: 03/07/2023 15:08   CT Lumbar Spine Wo Contrast  Result Date: 03/07/2023 CLINICAL DATA:  Fall EXAM:  CT LUMBAR SPINE WITHOUT CONTRAST TECHNIQUE: Multidetector CT imaging of the lumbar spine was performed without intravenous contrast administration. Multiplanar CT image reconstructions were also generated. RADIATION DOSE REDUCTION: This exam was performed according to the departmental dose-optimization program which includes automated exposure control, adjustment of the mA and/or kV according to patient size and/or use of iterative reconstruction technique. COMPARISON:  None Available. FINDINGS: Segmentation: 5 lumbar type vertebrae. Alignment: Normal. Vertebrae: Osteopenia. No acute fracture or focal pathologic process. Paraspinal and other soft tissues: Aortic atherosclerosis. Disc levels: Status post posterior fusion of L3 through S1. IMPRESSION: 1. No evidence of acute fracture or dislocation. 2. Status post posterior fusion of L3 through S1. 3. Osteopenia. Aortic Atherosclerosis (ICD10-I70.0). Electronically Signed   By: Delanna Ahmadi M.D.   On: 03/07/2023 15:08    Procedures Procedures    Medications Ordered in ED Medications  acetaminophen (TYLENOL) tablet 1,000 mg (1,000 mg Oral Patient Refused/Not Given 03/07/23 1617)    ED Course/ Medical Decision Making/ A&P Clinical Course as of 03/07/23 1721  Sat Mar 07, 2023  1515 CT Lumbar Spine Wo Contrast No fractures, dislocations or acute process [HS]  1516 CT PELVIS WO CONTRAST Right adnexal cyst, no acute process [HS]  1516 DG Chest 1 View No acute process [HS]  1519 I viewed by the patient, discussed CT scans are negative.  Patient typically ambulates with assistance of a walker, we will ambulate and if she is able to ambulate steadily will have her follow-up with PCP.  Patient son called while here, he is requesting that we check her urine for UA.  Patient has been having some dysuria which she did not mention to me.  Will add on UA. [HS]    Clinical Course User Index [HS] Sherrill Raring, PA-C                             Medical Decision  Making Amount and/or Complexity of Data Reviewed Labs: ordered. Radiology: ordered. Decision-making details documented in ED Course.  Risk OTC drugs. Prescription drug management.   Patient presents to the emergency room due to mechanical fall.  Differential includes fractures, dislocations, myalgias. History provided by the patient as well as by son and EMS report as documented in the HPI and ED course.  Pelvic exam there is no vaginal bleeding, no vaginal trauma or  lacerations.  There is a slight abrasion to shins that may be the blood on her thigh could have been spread from there.  Also possible underlying UTI given hematuria and concern about family about foul-smelling urine.  There is no flank pain, some pain to the lumbar spine.  Will start with CT imaging and then reevaluate.  UA is notable for hematuria and trace leukocyturia consistent with cystitis, will cover with the antibiotic.    Patient is able to ambulate with a steady gait across the room with walker.  I considered admission but given reassuring workup, baseline gait, no acute traumatic findings I do feel she is appropriate for outpatient follow-up.  Precautions were discussed with the patient who verbalized understanding, stable for discharge at this time.        Final Clinical Impression(s) / ED Diagnoses Final diagnoses:  Fall, initial encounter  Adnexal cyst  Acute cystitis with hematuria    Rx / DC Orders ED Discharge Orders          Ordered    cephALEXin (KEFLEX) 500 MG capsule  4 times daily        03/07/23 1620              Sherrill Raring, PA-C 03/07/23 1721    Cristie Hem, MD 03/10/23 1013

## 2023-03-07 NOTE — ED Notes (Signed)
Pt  up amb in hall with walker and did great. No c/o pain

## 2023-03-07 NOTE — ED Triage Notes (Signed)
Pt states she fell from a standing position and had vaginal bleeding afterwards.  Stated she fell on her "butt"  No head strike. Pt denies any pain

## 2023-03-07 NOTE — ED Notes (Signed)
EMS has been called for transport 

## 2023-03-09 DIAGNOSIS — M170 Bilateral primary osteoarthritis of knee: Secondary | ICD-10-CM | POA: Diagnosis not present

## 2023-03-09 DIAGNOSIS — I10 Essential (primary) hypertension: Secondary | ICD-10-CM | POA: Diagnosis not present

## 2023-03-09 DIAGNOSIS — E1142 Type 2 diabetes mellitus with diabetic polyneuropathy: Secondary | ICD-10-CM | POA: Diagnosis not present

## 2023-03-09 DIAGNOSIS — L89892 Pressure ulcer of other site, stage 2: Secondary | ICD-10-CM | POA: Diagnosis not present

## 2023-03-09 DIAGNOSIS — E1122 Type 2 diabetes mellitus with diabetic chronic kidney disease: Secondary | ICD-10-CM | POA: Diagnosis not present

## 2023-03-09 DIAGNOSIS — N183 Chronic kidney disease, stage 3 unspecified: Secondary | ICD-10-CM | POA: Diagnosis not present

## 2023-03-10 DIAGNOSIS — E538 Deficiency of other specified B group vitamins: Secondary | ICD-10-CM | POA: Diagnosis not present

## 2023-03-10 DIAGNOSIS — R4582 Worries: Secondary | ICD-10-CM | POA: Diagnosis not present

## 2023-03-10 DIAGNOSIS — E1140 Type 2 diabetes mellitus with diabetic neuropathy, unspecified: Secondary | ICD-10-CM | POA: Diagnosis not present

## 2023-03-10 DIAGNOSIS — E039 Hypothyroidism, unspecified: Secondary | ICD-10-CM | POA: Diagnosis not present

## 2023-03-10 DIAGNOSIS — E11621 Type 2 diabetes mellitus with foot ulcer: Secondary | ICD-10-CM | POA: Diagnosis not present

## 2023-03-10 DIAGNOSIS — E7849 Other hyperlipidemia: Secondary | ICD-10-CM | POA: Diagnosis not present

## 2023-03-10 DIAGNOSIS — I10 Essential (primary) hypertension: Secondary | ICD-10-CM | POA: Diagnosis not present

## 2023-03-10 DIAGNOSIS — G6289 Other specified polyneuropathies: Secondary | ICD-10-CM | POA: Diagnosis not present

## 2023-03-10 DIAGNOSIS — K7581 Nonalcoholic steatohepatitis (NASH): Secondary | ICD-10-CM | POA: Diagnosis not present

## 2023-03-10 DIAGNOSIS — E1122 Type 2 diabetes mellitus with diabetic chronic kidney disease: Secondary | ICD-10-CM | POA: Diagnosis not present

## 2023-03-10 DIAGNOSIS — E559 Vitamin D deficiency, unspecified: Secondary | ICD-10-CM | POA: Diagnosis not present

## 2023-03-11 DIAGNOSIS — L89892 Pressure ulcer of other site, stage 2: Secondary | ICD-10-CM | POA: Diagnosis not present

## 2023-03-11 DIAGNOSIS — N183 Chronic kidney disease, stage 3 unspecified: Secondary | ICD-10-CM | POA: Diagnosis not present

## 2023-03-11 DIAGNOSIS — M170 Bilateral primary osteoarthritis of knee: Secondary | ICD-10-CM | POA: Diagnosis not present

## 2023-03-11 DIAGNOSIS — E1122 Type 2 diabetes mellitus with diabetic chronic kidney disease: Secondary | ICD-10-CM | POA: Diagnosis not present

## 2023-03-11 DIAGNOSIS — E1142 Type 2 diabetes mellitus with diabetic polyneuropathy: Secondary | ICD-10-CM | POA: Diagnosis not present

## 2023-03-11 DIAGNOSIS — I10 Essential (primary) hypertension: Secondary | ICD-10-CM | POA: Diagnosis not present

## 2023-03-12 DIAGNOSIS — I10 Essential (primary) hypertension: Secondary | ICD-10-CM | POA: Diagnosis not present

## 2023-03-12 DIAGNOSIS — M170 Bilateral primary osteoarthritis of knee: Secondary | ICD-10-CM | POA: Diagnosis not present

## 2023-03-12 DIAGNOSIS — E1142 Type 2 diabetes mellitus with diabetic polyneuropathy: Secondary | ICD-10-CM | POA: Diagnosis not present

## 2023-03-12 DIAGNOSIS — L89892 Pressure ulcer of other site, stage 2: Secondary | ICD-10-CM | POA: Diagnosis not present

## 2023-03-12 DIAGNOSIS — N183 Chronic kidney disease, stage 3 unspecified: Secondary | ICD-10-CM | POA: Diagnosis not present

## 2023-03-12 DIAGNOSIS — E1122 Type 2 diabetes mellitus with diabetic chronic kidney disease: Secondary | ICD-10-CM | POA: Diagnosis not present

## 2023-03-16 DIAGNOSIS — L89892 Pressure ulcer of other site, stage 2: Secondary | ICD-10-CM | POA: Diagnosis not present

## 2023-03-16 DIAGNOSIS — I10 Essential (primary) hypertension: Secondary | ICD-10-CM | POA: Diagnosis not present

## 2023-03-16 DIAGNOSIS — E1122 Type 2 diabetes mellitus with diabetic chronic kidney disease: Secondary | ICD-10-CM | POA: Diagnosis not present

## 2023-03-16 DIAGNOSIS — N183 Chronic kidney disease, stage 3 unspecified: Secondary | ICD-10-CM | POA: Diagnosis not present

## 2023-03-16 DIAGNOSIS — E1142 Type 2 diabetes mellitus with diabetic polyneuropathy: Secondary | ICD-10-CM | POA: Diagnosis not present

## 2023-03-16 DIAGNOSIS — M170 Bilateral primary osteoarthritis of knee: Secondary | ICD-10-CM | POA: Diagnosis not present

## 2023-03-27 DIAGNOSIS — Z6837 Body mass index (BMI) 37.0-37.9, adult: Secondary | ICD-10-CM | POA: Diagnosis not present

## 2023-03-27 DIAGNOSIS — Z79891 Long term (current) use of opiate analgesic: Secondary | ICD-10-CM | POA: Diagnosis not present

## 2023-03-27 DIAGNOSIS — Z794 Long term (current) use of insulin: Secondary | ICD-10-CM | POA: Diagnosis not present

## 2023-03-27 DIAGNOSIS — E8881 Metabolic syndrome: Secondary | ICD-10-CM | POA: Diagnosis not present

## 2023-03-27 DIAGNOSIS — I129 Hypertensive chronic kidney disease with stage 1 through stage 4 chronic kidney disease, or unspecified chronic kidney disease: Secondary | ICD-10-CM | POA: Diagnosis not present

## 2023-03-27 DIAGNOSIS — M810 Age-related osteoporosis without current pathological fracture: Secondary | ICD-10-CM | POA: Diagnosis not present

## 2023-03-27 DIAGNOSIS — Z993 Dependence on wheelchair: Secondary | ICD-10-CM | POA: Diagnosis not present

## 2023-03-27 DIAGNOSIS — G4733 Obstructive sleep apnea (adult) (pediatric): Secondary | ICD-10-CM | POA: Diagnosis not present

## 2023-03-27 DIAGNOSIS — Z7984 Long term (current) use of oral hypoglycemic drugs: Secondary | ICD-10-CM | POA: Diagnosis not present

## 2023-03-27 DIAGNOSIS — N3001 Acute cystitis with hematuria: Secondary | ICD-10-CM | POA: Diagnosis not present

## 2023-03-27 DIAGNOSIS — E1142 Type 2 diabetes mellitus with diabetic polyneuropathy: Secondary | ICD-10-CM | POA: Diagnosis not present

## 2023-03-27 DIAGNOSIS — E1122 Type 2 diabetes mellitus with diabetic chronic kidney disease: Secondary | ICD-10-CM | POA: Diagnosis not present

## 2023-03-27 DIAGNOSIS — M1712 Unilateral primary osteoarthritis, left knee: Secondary | ICD-10-CM | POA: Diagnosis not present

## 2023-03-27 DIAGNOSIS — K7581 Nonalcoholic steatohepatitis (NASH): Secondary | ICD-10-CM | POA: Diagnosis not present

## 2023-03-27 DIAGNOSIS — K219 Gastro-esophageal reflux disease without esophagitis: Secondary | ICD-10-CM | POA: Diagnosis not present

## 2023-03-27 DIAGNOSIS — E538 Deficiency of other specified B group vitamins: Secondary | ICD-10-CM | POA: Diagnosis not present

## 2023-03-27 DIAGNOSIS — M5136 Other intervertebral disc degeneration, lumbar region: Secondary | ICD-10-CM | POA: Diagnosis not present

## 2023-03-27 DIAGNOSIS — K5900 Constipation, unspecified: Secondary | ICD-10-CM | POA: Diagnosis not present

## 2023-03-27 DIAGNOSIS — N183 Chronic kidney disease, stage 3 unspecified: Secondary | ICD-10-CM | POA: Diagnosis not present

## 2023-03-27 DIAGNOSIS — M19012 Primary osteoarthritis, left shoulder: Secondary | ICD-10-CM | POA: Diagnosis not present

## 2023-03-27 DIAGNOSIS — E039 Hypothyroidism, unspecified: Secondary | ICD-10-CM | POA: Diagnosis not present

## 2023-03-27 DIAGNOSIS — E782 Mixed hyperlipidemia: Secondary | ICD-10-CM | POA: Diagnosis not present

## 2023-03-30 DIAGNOSIS — L97512 Non-pressure chronic ulcer of other part of right foot with fat layer exposed: Secondary | ICD-10-CM | POA: Diagnosis not present

## 2023-03-30 DIAGNOSIS — L97522 Non-pressure chronic ulcer of other part of left foot with fat layer exposed: Secondary | ICD-10-CM | POA: Diagnosis not present

## 2023-03-30 DIAGNOSIS — L03032 Cellulitis of left toe: Secondary | ICD-10-CM | POA: Diagnosis not present

## 2023-04-02 DIAGNOSIS — I129 Hypertensive chronic kidney disease with stage 1 through stage 4 chronic kidney disease, or unspecified chronic kidney disease: Secondary | ICD-10-CM | POA: Diagnosis not present

## 2023-04-02 DIAGNOSIS — N183 Chronic kidney disease, stage 3 unspecified: Secondary | ICD-10-CM | POA: Diagnosis not present

## 2023-04-02 DIAGNOSIS — E1122 Type 2 diabetes mellitus with diabetic chronic kidney disease: Secondary | ICD-10-CM | POA: Diagnosis not present

## 2023-04-02 DIAGNOSIS — E039 Hypothyroidism, unspecified: Secondary | ICD-10-CM | POA: Diagnosis not present

## 2023-04-02 DIAGNOSIS — N3001 Acute cystitis with hematuria: Secondary | ICD-10-CM | POA: Diagnosis not present

## 2023-04-02 DIAGNOSIS — E1142 Type 2 diabetes mellitus with diabetic polyneuropathy: Secondary | ICD-10-CM | POA: Diagnosis not present

## 2023-04-03 DIAGNOSIS — E1142 Type 2 diabetes mellitus with diabetic polyneuropathy: Secondary | ICD-10-CM | POA: Diagnosis not present

## 2023-04-03 DIAGNOSIS — E1122 Type 2 diabetes mellitus with diabetic chronic kidney disease: Secondary | ICD-10-CM | POA: Diagnosis not present

## 2023-04-03 DIAGNOSIS — N183 Chronic kidney disease, stage 3 unspecified: Secondary | ICD-10-CM | POA: Diagnosis not present

## 2023-04-03 DIAGNOSIS — N3001 Acute cystitis with hematuria: Secondary | ICD-10-CM | POA: Diagnosis not present

## 2023-04-03 DIAGNOSIS — E039 Hypothyroidism, unspecified: Secondary | ICD-10-CM | POA: Diagnosis not present

## 2023-04-03 DIAGNOSIS — I129 Hypertensive chronic kidney disease with stage 1 through stage 4 chronic kidney disease, or unspecified chronic kidney disease: Secondary | ICD-10-CM | POA: Diagnosis not present

## 2023-04-06 DIAGNOSIS — N3001 Acute cystitis with hematuria: Secondary | ICD-10-CM | POA: Diagnosis not present

## 2023-04-06 DIAGNOSIS — E1142 Type 2 diabetes mellitus with diabetic polyneuropathy: Secondary | ICD-10-CM | POA: Diagnosis not present

## 2023-04-06 DIAGNOSIS — E8881 Metabolic syndrome: Secondary | ICD-10-CM | POA: Diagnosis not present

## 2023-04-06 DIAGNOSIS — E782 Mixed hyperlipidemia: Secondary | ICD-10-CM | POA: Diagnosis not present

## 2023-04-06 DIAGNOSIS — I129 Hypertensive chronic kidney disease with stage 1 through stage 4 chronic kidney disease, or unspecified chronic kidney disease: Secondary | ICD-10-CM | POA: Diagnosis not present

## 2023-04-06 DIAGNOSIS — E039 Hypothyroidism, unspecified: Secondary | ICD-10-CM | POA: Diagnosis not present

## 2023-04-06 DIAGNOSIS — E1122 Type 2 diabetes mellitus with diabetic chronic kidney disease: Secondary | ICD-10-CM | POA: Diagnosis not present

## 2023-04-06 DIAGNOSIS — N183 Chronic kidney disease, stage 3 unspecified: Secondary | ICD-10-CM | POA: Diagnosis not present

## 2023-04-09 DIAGNOSIS — I129 Hypertensive chronic kidney disease with stage 1 through stage 4 chronic kidney disease, or unspecified chronic kidney disease: Secondary | ICD-10-CM | POA: Diagnosis not present

## 2023-04-09 DIAGNOSIS — N3001 Acute cystitis with hematuria: Secondary | ICD-10-CM | POA: Diagnosis not present

## 2023-04-09 DIAGNOSIS — E1142 Type 2 diabetes mellitus with diabetic polyneuropathy: Secondary | ICD-10-CM | POA: Diagnosis not present

## 2023-04-09 DIAGNOSIS — E039 Hypothyroidism, unspecified: Secondary | ICD-10-CM | POA: Diagnosis not present

## 2023-04-09 DIAGNOSIS — N183 Chronic kidney disease, stage 3 unspecified: Secondary | ICD-10-CM | POA: Diagnosis not present

## 2023-04-09 DIAGNOSIS — E1122 Type 2 diabetes mellitus with diabetic chronic kidney disease: Secondary | ICD-10-CM | POA: Diagnosis not present

## 2023-04-10 DIAGNOSIS — E1142 Type 2 diabetes mellitus with diabetic polyneuropathy: Secondary | ICD-10-CM | POA: Diagnosis not present

## 2023-04-10 DIAGNOSIS — E1122 Type 2 diabetes mellitus with diabetic chronic kidney disease: Secondary | ICD-10-CM | POA: Diagnosis not present

## 2023-04-10 DIAGNOSIS — N183 Chronic kidney disease, stage 3 unspecified: Secondary | ICD-10-CM | POA: Diagnosis not present

## 2023-04-10 DIAGNOSIS — N3001 Acute cystitis with hematuria: Secondary | ICD-10-CM | POA: Diagnosis not present

## 2023-04-10 DIAGNOSIS — I129 Hypertensive chronic kidney disease with stage 1 through stage 4 chronic kidney disease, or unspecified chronic kidney disease: Secondary | ICD-10-CM | POA: Diagnosis not present

## 2023-04-10 DIAGNOSIS — E039 Hypothyroidism, unspecified: Secondary | ICD-10-CM | POA: Diagnosis not present

## 2023-04-12 ENCOUNTER — Encounter (HOSPITAL_COMMUNITY): Payer: Self-pay

## 2023-04-12 ENCOUNTER — Other Ambulatory Visit: Payer: Self-pay

## 2023-04-12 ENCOUNTER — Emergency Department (HOSPITAL_COMMUNITY)
Admission: EM | Admit: 2023-04-12 | Discharge: 2023-04-12 | Disposition: A | Discharge status: Skilled Nursing Facility | Payer: Medicare Other | Attending: Emergency Medicine | Admitting: Emergency Medicine

## 2023-04-12 DIAGNOSIS — R58 Hemorrhage, not elsewhere classified: Secondary | ICD-10-CM

## 2023-04-12 DIAGNOSIS — I129 Hypertensive chronic kidney disease with stage 1 through stage 4 chronic kidney disease, or unspecified chronic kidney disease: Secondary | ICD-10-CM | POA: Insufficient documentation

## 2023-04-12 DIAGNOSIS — E039 Hypothyroidism, unspecified: Secondary | ICD-10-CM | POA: Insufficient documentation

## 2023-04-12 DIAGNOSIS — N189 Chronic kidney disease, unspecified: Secondary | ICD-10-CM | POA: Diagnosis not present

## 2023-04-12 DIAGNOSIS — I10 Essential (primary) hypertension: Secondary | ICD-10-CM | POA: Diagnosis not present

## 2023-04-12 DIAGNOSIS — B9689 Other specified bacterial agents as the cause of diseases classified elsewhere: Secondary | ICD-10-CM | POA: Diagnosis not present

## 2023-04-12 DIAGNOSIS — Z79899 Other long term (current) drug therapy: Secondary | ICD-10-CM | POA: Insufficient documentation

## 2023-04-12 DIAGNOSIS — E1122 Type 2 diabetes mellitus with diabetic chronic kidney disease: Secondary | ICD-10-CM | POA: Diagnosis not present

## 2023-04-12 DIAGNOSIS — E1140 Type 2 diabetes mellitus with diabetic neuropathy, unspecified: Secondary | ICD-10-CM | POA: Diagnosis not present

## 2023-04-12 DIAGNOSIS — Z794 Long term (current) use of insulin: Secondary | ICD-10-CM | POA: Diagnosis not present

## 2023-04-12 DIAGNOSIS — L22 Diaper dermatitis: Secondary | ICD-10-CM | POA: Diagnosis not present

## 2023-04-12 DIAGNOSIS — N760 Acute vaginitis: Secondary | ICD-10-CM | POA: Diagnosis not present

## 2023-04-12 DIAGNOSIS — Z7984 Long term (current) use of oral hypoglycemic drugs: Secondary | ICD-10-CM | POA: Diagnosis not present

## 2023-04-12 DIAGNOSIS — N939 Abnormal uterine and vaginal bleeding, unspecified: Secondary | ICD-10-CM | POA: Diagnosis not present

## 2023-04-12 LAB — URINALYSIS, ROUTINE W REFLEX MICROSCOPIC
Bacteria, UA: NONE SEEN
Bilirubin Urine: NEGATIVE
Glucose, UA: NEGATIVE mg/dL
Ketones, ur: NEGATIVE mg/dL
Leukocytes,Ua: NEGATIVE
Nitrite: NEGATIVE
Protein, ur: NEGATIVE mg/dL
Specific Gravity, Urine: 1.008 (ref 1.005–1.030)
pH: 6 (ref 5.0–8.0)

## 2023-04-12 LAB — CBC WITH DIFFERENTIAL/PLATELET
Abs Immature Granulocytes: 0.02 10*3/uL (ref 0.00–0.07)
Basophils Absolute: 0 10*3/uL (ref 0.0–0.1)
Basophils Relative: 0 %
Eosinophils Absolute: 0.1 10*3/uL (ref 0.0–0.5)
Eosinophils Relative: 1 %
HCT: 37.3 % (ref 36.0–46.0)
Hemoglobin: 11.1 g/dL — ABNORMAL LOW (ref 12.0–15.0)
Immature Granulocytes: 0 %
Lymphocytes Relative: 23 %
Lymphs Abs: 1.7 10*3/uL (ref 0.7–4.0)
MCH: 25.5 pg — ABNORMAL LOW (ref 26.0–34.0)
MCHC: 29.8 g/dL — ABNORMAL LOW (ref 30.0–36.0)
MCV: 85.6 fL (ref 80.0–100.0)
Monocytes Absolute: 0.4 10*3/uL (ref 0.1–1.0)
Monocytes Relative: 6 %
Neutro Abs: 5.2 10*3/uL (ref 1.7–7.7)
Neutrophils Relative %: 70 %
Platelets: 167 10*3/uL (ref 150–400)
RBC: 4.36 MIL/uL (ref 3.87–5.11)
RDW: 15.5 % (ref 11.5–15.5)
WBC: 7.4 10*3/uL (ref 4.0–10.5)
nRBC: 0 % (ref 0.0–0.2)

## 2023-04-12 LAB — BASIC METABOLIC PANEL
Anion gap: 8 (ref 5–15)
BUN: 10 mg/dL (ref 8–23)
CO2: 28 mmol/L (ref 22–32)
Calcium: 8.4 mg/dL — ABNORMAL LOW (ref 8.9–10.3)
Chloride: 100 mmol/L (ref 98–111)
Creatinine, Ser: 0.76 mg/dL (ref 0.44–1.00)
GFR, Estimated: >60 mL/min (ref >60)
Glucose, Bld: 153 mg/dL — ABNORMAL HIGH (ref 70–99)
Potassium: 3.3 mmol/L — ABNORMAL LOW (ref 3.5–5.1)
Sodium: 136 mmol/L (ref 135–145)

## 2023-04-12 LAB — TYPE AND SCREEN
ABO/RH(D): O POS
Antibody Screen: NEGATIVE

## 2023-04-12 LAB — POC OCCULT BLOOD, ED: Fecal Occult Bld: NEGATIVE

## 2023-04-12 LAB — WET PREP, GENITAL
Sperm: NONE SEEN
Trich, Wet Prep: NONE SEEN
WBC, Wet Prep HPF POC: <10 (ref <10)
Yeast Wet Prep HPF POC: NONE SEEN

## 2023-04-12 MED ORDER — METRONIDAZOLE 500 MG PO TABS: 500.0000 mg | ORAL_TABLET | Freq: Two times a day (BID) | ORAL | 0 refills | Status: AC

## 2023-04-12 MED ORDER — METRONIDAZOLE 500 MG PO TABS
500.0000 mg | ORAL_TABLET | Freq: Once | ORAL | Status: AC
  Administered 2023-04-12: 500 mg via ORAL
  Filled 2023-04-12: qty 1

## 2023-04-12 NOTE — Discharge Instructions (Addendum)
Thank you for coming to [facility] Emergency Department. You were seen for concern for vaginal bleeding. We did an exam, labs, and imaging, and these showed a bacterial overgrowth in your vagina called bacterial vaginosis.  We will treat you with Flagyl and antibiotic 500 mg twice per day for 7 days.  This could have been because of your vaginal bleeding however on pelvic exam you did not have any blood in your vagina or any active bleeding.    On exam of your urine, vagina, and rectum there was not noted to be any blood.  It is unclear where you are bleeding from earlier but you do not appear to be bleeding anymore.  Please monitor for further bleeding or symptoms such as chest pain, shortness of breath, lightheadedness, pain with urination, bloody bowel movements.  Please follow up with your primary care provider within 1 week.   Do not hesitate to return to the ED or call 911 if you experience: -Worsening symptoms -Lightheadedness, passing out -Fevers/chills -Anything else that concerns you

## 2023-04-12 NOTE — ED Notes (Signed)
ED Provider at bedside. 

## 2023-04-12 NOTE — ED Triage Notes (Signed)
Pt arrived via [facility] from [facility] c/o vaginal bleeding that Pt reports was noticed today. Pt denies being on a blood thinner. Pt reports seeing small clots.

## 2023-04-12 NOTE — ED Notes (Signed)
This RN called [facility] to give report to staff at the facility, named [facility]. This RN was notified the facility would be unable to provide Pt transport following D/C back to their facility until after 9am Monday morning if they had adequate staff.

## 2023-04-12 NOTE — ED Provider Notes (Signed)
[place] EMERGENCY DEPARTMENT AT [facility] Provider Note   CSN: [id number] Arrival date & time: 04/12/23  1916     History {Add pertinent medical, surgical, social history, OB history to HPI:1} No chief complaint on file.   Kathryn Ryan is a 82 y.o. female with HTN, T2DM, HLD, hypothyroidism, OSA, CKD, morbid obesity, L renal/adrenal mass, hepatosteatosis, h/o septic arthritis and osteomyelitis who presents with vaginal bleeding.  Patient arrived via [facility] from [facility] c/o vaginal bleeding that Pt reports was noticed today. Pt denies being on a blood thinner. Pt reports seeing small clots.   HPI     Home Medications Prior to Admission medications   Medication Sig Start Date End Date Taking? Authorizing Provider  atorvastatin (LIPITOR) 10 MG tablet Take 10 mg by mouth every morning.    [provider]  cephALEXin (KEFLEX) 500 MG capsule Take 1 capsule (500 mg total) by mouth 4 (four) times daily. 03/07/23   Theron Arista, PA-C  cholecalciferol (VITAMIN D3) 25 MCG (1000 UNIT) tablet Take 2,000 Units by mouth every morning.    [provider]  Cranberry 450 MG CAPS Take 450 mg by mouth every morning.    [provider]  DULoxetine (CYMBALTA) 60 MG capsule Take 60 mg by mouth 2 (two) times daily.    [provider]  Exenatide ER (BYDUREON) 2 MG PEN Inject 2 mg into the skin every Thursday.    [provider]  HYDROcodone-acetaminophen (VICODIN) 5-325 mg TABS tablet Take 1 tablet by mouth at bedtime.    [provider]  insulin glargine (LANTUS) 100 UNIT/ML injection Inject 120 Units into the skin at bedtime.    [provider]  levothyroxine (SYNTHROID, LEVOTHROID) 150 MCG tablet Take 150 mcg by mouth daily before breakfast.    [provider]  Melatonin 12 MG TABS Take 12 mg by mouth at bedtime.    [provider]  metFORMIN (GLUCOPHAGE-XR) 500 MG 24 hr tablet Take 500  mg by mouth at bedtime. 06/03/16 03/07/23  [provider]  omeprazole (PRILOSEC) 20 MG capsule Take 20 mg by mouth every morning.    [provider]  pregabalin (LYRICA) 200 MG capsule Take 200 mg by mouth 2 (two) times daily.    [provider]  traZODone (DESYREL) 100 MG tablet Take 100 mg by mouth at bedtime.    [provider]      Allergies    Statins and Sulfa antibiotics    Review of Systems   Review of Systems Review of systems {pos/neg:18640::"Negative","Positive"} for ***.  A 10 point review of systems was performed and is negative unless otherwise reported in HPI.  Physical Exam Updated Vital Signs There were no vitals taken for this visit. Physical Exam General: Normal appearing {Desc; female/female:11659}, lying in bed.  HEENT: PERRLA, Sclera anicteric, MMM, trachea midline.  Cardiology: RRR, no murmurs/rubs/gallops. BL radial and DP pulses equal bilaterally.  Resp: Normal respiratory rate and effort. CTAB, no wheezes, rhonchi, crackles.  Abd: Soft, non-tender, non-distended. No rebound tenderness or guarding.  GU: Deferred. MSK: No peripheral edema or signs of trauma. Extremities without deformity or TTP. No cyanosis or clubbing. Skin: warm, dry. No rashes or lesions. Back: No CVA tenderness Neuro: A&Ox4, CNs II-XII grossly intact. MAEs. Sensation grossly intact.  Psych: Normal mood and affect.   ED Results / Procedures / Treatments   Labs (all labs ordered are listed, but only abnormal results are displayed) Labs Reviewed -  No data to display  EKG None  Radiology No results found.  Procedures Procedures  {Document cardiac monitor, telemetry assessment procedure when appropriate:1}  Medications Ordered in ED Medications - No data to display  ED Course/ Medical Decision Making/ A&P                          Medical Decision Making Amount and/or Complexity of Data Reviewed Labs: ordered.    This patient presents to  the ED for concern of ***, this involves an extensive number of treatment options, and is a complaint that carries with it a high risk of complications and morbidity.  I considered the following differential and admission for this acute, potentially life threatening condition.   MDM:    ***     Labs: I Ordered, and personally interpreted labs.  The pertinent results include:  ***  Imaging Studies ordered: I ordered imaging studies including *** I independently visualized and interpreted imaging. I agree with the radiologist interpretation  Additional history obtained from ***.  External records from outside source obtained and reviewed including ***  Cardiac Monitoring: The patient was maintained on a cardiac monitor.  I personally viewed and interpreted the cardiac monitored which showed an underlying rhythm of: ***  Reevaluation: After the interventions noted above, I reevaluated the patient and found that they have :{resolved/improved/worsened:23923::"improved"}  Social Determinants of Health: ***  Disposition:  ***  Co morbidities that complicate the patient evaluation  Past Medical History:  Diagnosis Date   Anxiety    Diabetes (HCC) 08/04/2017   Diabetes mellitus without complication (HCC)    new onset diabetic    GERD (gastroesophageal reflux disease)    H/O hiatal hernia    Hypertension    Hypothyroidism    Lumbar radiculopathy    Neuropathy    non related to DM-not sure why   Neuropathy    non DM related   Spinal stenosis of lumbar region      Medicines No orders of the defined types were placed in this encounter.   I have reviewed the patients home medicines and have made adjustments as needed  Problem List / ED Course: Problem List Items Addressed This Visit   None        {Document critical care time when appropriate:1} {Document review of labs and clinical decision tools ie heart score, Chads2Vasc2 etc:1}  {Document your independent review  of radiology images, and any outside records:1} {Document your discussion with family members, caretakers, and with consultants:1} {Document social determinants of health affecting pt's care:1} {Document your decision making why or why not admission, treatments were needed:1}  This note was created using dictation software, which may contain spelling or grammatical errors.

## 2023-04-12 NOTE — ED Notes (Signed)
Notified [facility] of patient needing transportation back to [facility].

## 2023-04-14 DIAGNOSIS — N3001 Acute cystitis with hematuria: Secondary | ICD-10-CM | POA: Diagnosis not present

## 2023-04-14 DIAGNOSIS — N183 Chronic kidney disease, stage 3 unspecified: Secondary | ICD-10-CM | POA: Diagnosis not present

## 2023-04-14 DIAGNOSIS — E039 Hypothyroidism, unspecified: Secondary | ICD-10-CM | POA: Diagnosis not present

## 2023-04-14 DIAGNOSIS — I129 Hypertensive chronic kidney disease with stage 1 through stage 4 chronic kidney disease, or unspecified chronic kidney disease: Secondary | ICD-10-CM | POA: Diagnosis not present

## 2023-04-14 DIAGNOSIS — E1122 Type 2 diabetes mellitus with diabetic chronic kidney disease: Secondary | ICD-10-CM | POA: Diagnosis not present

## 2023-04-14 DIAGNOSIS — E1142 Type 2 diabetes mellitus with diabetic polyneuropathy: Secondary | ICD-10-CM | POA: Diagnosis not present

## 2023-04-16 DIAGNOSIS — N183 Chronic kidney disease, stage 3 unspecified: Secondary | ICD-10-CM | POA: Diagnosis not present

## 2023-04-16 DIAGNOSIS — E1122 Type 2 diabetes mellitus with diabetic chronic kidney disease: Secondary | ICD-10-CM | POA: Diagnosis not present

## 2023-04-16 DIAGNOSIS — N3001 Acute cystitis with hematuria: Secondary | ICD-10-CM | POA: Diagnosis not present

## 2023-04-16 DIAGNOSIS — E039 Hypothyroidism, unspecified: Secondary | ICD-10-CM | POA: Diagnosis not present

## 2023-04-16 DIAGNOSIS — E1142 Type 2 diabetes mellitus with diabetic polyneuropathy: Secondary | ICD-10-CM | POA: Diagnosis not present

## 2023-04-16 DIAGNOSIS — I129 Hypertensive chronic kidney disease with stage 1 through stage 4 chronic kidney disease, or unspecified chronic kidney disease: Secondary | ICD-10-CM | POA: Diagnosis not present

## 2023-04-20 DIAGNOSIS — E039 Hypothyroidism, unspecified: Secondary | ICD-10-CM | POA: Diagnosis not present

## 2023-04-20 DIAGNOSIS — E538 Deficiency of other specified B group vitamins: Secondary | ICD-10-CM | POA: Diagnosis not present

## 2023-04-20 DIAGNOSIS — K7581 Nonalcoholic steatohepatitis (NASH): Secondary | ICD-10-CM | POA: Diagnosis not present

## 2023-04-20 DIAGNOSIS — M810 Age-related osteoporosis without current pathological fracture: Secondary | ICD-10-CM | POA: Diagnosis not present

## 2023-04-20 DIAGNOSIS — E1122 Type 2 diabetes mellitus with diabetic chronic kidney disease: Secondary | ICD-10-CM | POA: Diagnosis not present

## 2023-04-20 DIAGNOSIS — E7849 Other hyperlipidemia: Secondary | ICD-10-CM | POA: Diagnosis not present

## 2023-04-20 DIAGNOSIS — E559 Vitamin D deficiency, unspecified: Secondary | ICD-10-CM | POA: Diagnosis not present

## 2023-04-20 DIAGNOSIS — I10 Essential (primary) hypertension: Secondary | ICD-10-CM | POA: Diagnosis not present

## 2023-04-20 DIAGNOSIS — J329 Chronic sinusitis, unspecified: Secondary | ICD-10-CM | POA: Diagnosis not present

## 2023-04-20 DIAGNOSIS — E1140 Type 2 diabetes mellitus with diabetic neuropathy, unspecified: Secondary | ICD-10-CM | POA: Diagnosis not present

## 2023-04-20 DIAGNOSIS — E11621 Type 2 diabetes mellitus with foot ulcer: Secondary | ICD-10-CM | POA: Diagnosis not present

## 2023-04-21 DIAGNOSIS — E039 Hypothyroidism, unspecified: Secondary | ICD-10-CM | POA: Diagnosis not present

## 2023-04-21 DIAGNOSIS — I129 Hypertensive chronic kidney disease with stage 1 through stage 4 chronic kidney disease, or unspecified chronic kidney disease: Secondary | ICD-10-CM | POA: Diagnosis not present

## 2023-04-21 DIAGNOSIS — N183 Chronic kidney disease, stage 3 unspecified: Secondary | ICD-10-CM | POA: Diagnosis not present

## 2023-04-21 DIAGNOSIS — N3001 Acute cystitis with hematuria: Secondary | ICD-10-CM | POA: Diagnosis not present

## 2023-04-21 DIAGNOSIS — E1142 Type 2 diabetes mellitus with diabetic polyneuropathy: Secondary | ICD-10-CM | POA: Diagnosis not present

## 2023-04-21 DIAGNOSIS — E1122 Type 2 diabetes mellitus with diabetic chronic kidney disease: Secondary | ICD-10-CM | POA: Diagnosis not present

## 2023-04-23 DIAGNOSIS — E1122 Type 2 diabetes mellitus with diabetic chronic kidney disease: Secondary | ICD-10-CM | POA: Diagnosis not present

## 2023-04-23 DIAGNOSIS — Z6837 Body mass index (BMI) 37.0-37.9, adult: Secondary | ICD-10-CM | POA: Diagnosis not present

## 2023-04-23 DIAGNOSIS — R030 Elevated blood-pressure reading, without diagnosis of hypertension: Secondary | ICD-10-CM | POA: Diagnosis not present

## 2023-04-23 DIAGNOSIS — Z7689 Persons encountering health services in other specified circumstances: Secondary | ICD-10-CM | POA: Diagnosis not present

## 2023-04-23 DIAGNOSIS — M1711 Unilateral primary osteoarthritis, right knee: Secondary | ICD-10-CM | POA: Diagnosis not present

## 2023-04-23 DIAGNOSIS — M1712 Unilateral primary osteoarthritis, left knee: Secondary | ICD-10-CM | POA: Diagnosis not present

## 2023-04-24 DIAGNOSIS — E039 Hypothyroidism, unspecified: Secondary | ICD-10-CM | POA: Diagnosis not present

## 2023-04-24 DIAGNOSIS — N3001 Acute cystitis with hematuria: Secondary | ICD-10-CM | POA: Diagnosis not present

## 2023-04-24 DIAGNOSIS — I129 Hypertensive chronic kidney disease with stage 1 through stage 4 chronic kidney disease, or unspecified chronic kidney disease: Secondary | ICD-10-CM | POA: Diagnosis not present

## 2023-04-24 DIAGNOSIS — E1142 Type 2 diabetes mellitus with diabetic polyneuropathy: Secondary | ICD-10-CM | POA: Diagnosis not present

## 2023-04-24 DIAGNOSIS — E1122 Type 2 diabetes mellitus with diabetic chronic kidney disease: Secondary | ICD-10-CM | POA: Diagnosis not present

## 2023-04-24 DIAGNOSIS — N183 Chronic kidney disease, stage 3 unspecified: Secondary | ICD-10-CM | POA: Diagnosis not present

## 2023-04-26 DIAGNOSIS — Z7984 Long term (current) use of oral hypoglycemic drugs: Secondary | ICD-10-CM | POA: Diagnosis not present

## 2023-04-26 DIAGNOSIS — Z79891 Long term (current) use of opiate analgesic: Secondary | ICD-10-CM | POA: Diagnosis not present

## 2023-04-26 DIAGNOSIS — G4733 Obstructive sleep apnea (adult) (pediatric): Secondary | ICD-10-CM | POA: Diagnosis not present

## 2023-04-26 DIAGNOSIS — I129 Hypertensive chronic kidney disease with stage 1 through stage 4 chronic kidney disease, or unspecified chronic kidney disease: Secondary | ICD-10-CM | POA: Diagnosis not present

## 2023-04-26 DIAGNOSIS — K219 Gastro-esophageal reflux disease without esophagitis: Secondary | ICD-10-CM | POA: Diagnosis not present

## 2023-04-26 DIAGNOSIS — M1712 Unilateral primary osteoarthritis, left knee: Secondary | ICD-10-CM | POA: Diagnosis not present

## 2023-04-26 DIAGNOSIS — M5136 Other intervertebral disc degeneration, lumbar region: Secondary | ICD-10-CM | POA: Diagnosis not present

## 2023-04-26 DIAGNOSIS — K7581 Nonalcoholic steatohepatitis (NASH): Secondary | ICD-10-CM | POA: Diagnosis not present

## 2023-04-26 DIAGNOSIS — E782 Mixed hyperlipidemia: Secondary | ICD-10-CM | POA: Diagnosis not present

## 2023-04-26 DIAGNOSIS — Z6837 Body mass index (BMI) 37.0-37.9, adult: Secondary | ICD-10-CM | POA: Diagnosis not present

## 2023-04-26 DIAGNOSIS — M810 Age-related osteoporosis without current pathological fracture: Secondary | ICD-10-CM | POA: Diagnosis not present

## 2023-04-26 DIAGNOSIS — E1122 Type 2 diabetes mellitus with diabetic chronic kidney disease: Secondary | ICD-10-CM | POA: Diagnosis not present

## 2023-04-26 DIAGNOSIS — N3001 Acute cystitis with hematuria: Secondary | ICD-10-CM | POA: Diagnosis not present

## 2023-04-26 DIAGNOSIS — K5900 Constipation, unspecified: Secondary | ICD-10-CM | POA: Diagnosis not present

## 2023-04-26 DIAGNOSIS — E8881 Metabolic syndrome: Secondary | ICD-10-CM | POA: Diagnosis not present

## 2023-04-26 DIAGNOSIS — Z993 Dependence on wheelchair: Secondary | ICD-10-CM | POA: Diagnosis not present

## 2023-04-26 DIAGNOSIS — N183 Chronic kidney disease, stage 3 unspecified: Secondary | ICD-10-CM | POA: Diagnosis not present

## 2023-04-26 DIAGNOSIS — E538 Deficiency of other specified B group vitamins: Secondary | ICD-10-CM | POA: Diagnosis not present

## 2023-04-26 DIAGNOSIS — M19012 Primary osteoarthritis, left shoulder: Secondary | ICD-10-CM | POA: Diagnosis not present

## 2023-04-26 DIAGNOSIS — E039 Hypothyroidism, unspecified: Secondary | ICD-10-CM | POA: Diagnosis not present

## 2023-04-26 DIAGNOSIS — Z794 Long term (current) use of insulin: Secondary | ICD-10-CM | POA: Diagnosis not present

## 2023-04-26 DIAGNOSIS — E1142 Type 2 diabetes mellitus with diabetic polyneuropathy: Secondary | ICD-10-CM | POA: Diagnosis not present

## 2023-04-28 DIAGNOSIS — E039 Hypothyroidism, unspecified: Secondary | ICD-10-CM | POA: Diagnosis not present

## 2023-04-28 DIAGNOSIS — E1142 Type 2 diabetes mellitus with diabetic polyneuropathy: Secondary | ICD-10-CM | POA: Diagnosis not present

## 2023-04-28 DIAGNOSIS — I129 Hypertensive chronic kidney disease with stage 1 through stage 4 chronic kidney disease, or unspecified chronic kidney disease: Secondary | ICD-10-CM | POA: Diagnosis not present

## 2023-04-28 DIAGNOSIS — N183 Chronic kidney disease, stage 3 unspecified: Secondary | ICD-10-CM | POA: Diagnosis not present

## 2023-04-28 DIAGNOSIS — E1122 Type 2 diabetes mellitus with diabetic chronic kidney disease: Secondary | ICD-10-CM | POA: Diagnosis not present

## 2023-04-28 DIAGNOSIS — N3001 Acute cystitis with hematuria: Secondary | ICD-10-CM | POA: Diagnosis not present

## 2023-04-29 DIAGNOSIS — E039 Hypothyroidism, unspecified: Secondary | ICD-10-CM | POA: Diagnosis not present

## 2023-04-29 DIAGNOSIS — E1142 Type 2 diabetes mellitus with diabetic polyneuropathy: Secondary | ICD-10-CM | POA: Diagnosis not present

## 2023-04-29 DIAGNOSIS — I129 Hypertensive chronic kidney disease with stage 1 through stage 4 chronic kidney disease, or unspecified chronic kidney disease: Secondary | ICD-10-CM | POA: Diagnosis not present

## 2023-04-29 DIAGNOSIS — E1122 Type 2 diabetes mellitus with diabetic chronic kidney disease: Secondary | ICD-10-CM | POA: Diagnosis not present

## 2023-04-29 DIAGNOSIS — N3001 Acute cystitis with hematuria: Secondary | ICD-10-CM | POA: Diagnosis not present

## 2023-04-29 DIAGNOSIS — N183 Chronic kidney disease, stage 3 unspecified: Secondary | ICD-10-CM | POA: Diagnosis not present

## 2023-04-30 DIAGNOSIS — I129 Hypertensive chronic kidney disease with stage 1 through stage 4 chronic kidney disease, or unspecified chronic kidney disease: Secondary | ICD-10-CM | POA: Diagnosis not present

## 2023-04-30 DIAGNOSIS — E1122 Type 2 diabetes mellitus with diabetic chronic kidney disease: Secondary | ICD-10-CM | POA: Diagnosis not present

## 2023-04-30 DIAGNOSIS — E1142 Type 2 diabetes mellitus with diabetic polyneuropathy: Secondary | ICD-10-CM | POA: Diagnosis not present

## 2023-04-30 DIAGNOSIS — N183 Chronic kidney disease, stage 3 unspecified: Secondary | ICD-10-CM | POA: Diagnosis not present

## 2023-04-30 DIAGNOSIS — N3001 Acute cystitis with hematuria: Secondary | ICD-10-CM | POA: Diagnosis not present

## 2023-04-30 DIAGNOSIS — E039 Hypothyroidism, unspecified: Secondary | ICD-10-CM | POA: Diagnosis not present

## 2023-05-06 DIAGNOSIS — E1142 Type 2 diabetes mellitus with diabetic polyneuropathy: Secondary | ICD-10-CM | POA: Diagnosis not present

## 2023-05-06 DIAGNOSIS — N183 Chronic kidney disease, stage 3 unspecified: Secondary | ICD-10-CM | POA: Diagnosis not present

## 2023-05-06 DIAGNOSIS — N3001 Acute cystitis with hematuria: Secondary | ICD-10-CM | POA: Diagnosis not present

## 2023-05-06 DIAGNOSIS — E1122 Type 2 diabetes mellitus with diabetic chronic kidney disease: Secondary | ICD-10-CM | POA: Diagnosis not present

## 2023-05-06 DIAGNOSIS — I129 Hypertensive chronic kidney disease with stage 1 through stage 4 chronic kidney disease, or unspecified chronic kidney disease: Secondary | ICD-10-CM | POA: Diagnosis not present

## 2023-05-06 DIAGNOSIS — E039 Hypothyroidism, unspecified: Secondary | ICD-10-CM | POA: Diagnosis not present

## 2023-05-08 DIAGNOSIS — R5381 Other malaise: Secondary | ICD-10-CM | POA: Diagnosis not present

## 2023-05-08 DIAGNOSIS — T148XXA Other injury of unspecified body region, initial encounter: Secondary | ICD-10-CM | POA: Diagnosis not present

## 2023-05-08 DIAGNOSIS — W19XXXA Unspecified fall, initial encounter: Secondary | ICD-10-CM | POA: Diagnosis not present

## 2023-05-11 DIAGNOSIS — E1142 Type 2 diabetes mellitus with diabetic polyneuropathy: Secondary | ICD-10-CM | POA: Diagnosis not present

## 2023-05-11 DIAGNOSIS — I129 Hypertensive chronic kidney disease with stage 1 through stage 4 chronic kidney disease, or unspecified chronic kidney disease: Secondary | ICD-10-CM | POA: Diagnosis not present

## 2023-05-11 DIAGNOSIS — N183 Chronic kidney disease, stage 3 unspecified: Secondary | ICD-10-CM | POA: Diagnosis not present

## 2023-05-11 DIAGNOSIS — N3001 Acute cystitis with hematuria: Secondary | ICD-10-CM | POA: Diagnosis not present

## 2023-05-11 DIAGNOSIS — E1122 Type 2 diabetes mellitus with diabetic chronic kidney disease: Secondary | ICD-10-CM | POA: Diagnosis not present

## 2023-05-11 DIAGNOSIS — E039 Hypothyroidism, unspecified: Secondary | ICD-10-CM | POA: Diagnosis not present

## 2023-05-13 DIAGNOSIS — E1122 Type 2 diabetes mellitus with diabetic chronic kidney disease: Secondary | ICD-10-CM | POA: Diagnosis not present

## 2023-05-13 DIAGNOSIS — N183 Chronic kidney disease, stage 3 unspecified: Secondary | ICD-10-CM | POA: Diagnosis not present

## 2023-05-13 DIAGNOSIS — N3001 Acute cystitis with hematuria: Secondary | ICD-10-CM | POA: Diagnosis not present

## 2023-05-13 DIAGNOSIS — E1142 Type 2 diabetes mellitus with diabetic polyneuropathy: Secondary | ICD-10-CM | POA: Diagnosis not present

## 2023-05-13 DIAGNOSIS — I129 Hypertensive chronic kidney disease with stage 1 through stage 4 chronic kidney disease, or unspecified chronic kidney disease: Secondary | ICD-10-CM | POA: Diagnosis not present

## 2023-05-13 DIAGNOSIS — E039 Hypothyroidism, unspecified: Secondary | ICD-10-CM | POA: Diagnosis not present

## 2023-05-18 DIAGNOSIS — L97512 Non-pressure chronic ulcer of other part of right foot with fat layer exposed: Secondary | ICD-10-CM | POA: Diagnosis not present

## 2023-05-18 DIAGNOSIS — E1142 Type 2 diabetes mellitus with diabetic polyneuropathy: Secondary | ICD-10-CM | POA: Diagnosis not present

## 2023-05-18 DIAGNOSIS — B351 Tinea unguium: Secondary | ICD-10-CM | POA: Diagnosis not present

## 2023-05-18 DIAGNOSIS — L851 Acquired keratosis [keratoderma] palmaris et plantaris: Secondary | ICD-10-CM | POA: Diagnosis not present

## 2023-05-21 DIAGNOSIS — N183 Chronic kidney disease, stage 3 unspecified: Secondary | ICD-10-CM | POA: Diagnosis not present

## 2023-05-21 DIAGNOSIS — I129 Hypertensive chronic kidney disease with stage 1 through stage 4 chronic kidney disease, or unspecified chronic kidney disease: Secondary | ICD-10-CM | POA: Diagnosis not present

## 2023-05-21 DIAGNOSIS — N3001 Acute cystitis with hematuria: Secondary | ICD-10-CM | POA: Diagnosis not present

## 2023-05-21 DIAGNOSIS — E039 Hypothyroidism, unspecified: Secondary | ICD-10-CM | POA: Diagnosis not present

## 2023-05-21 DIAGNOSIS — E1142 Type 2 diabetes mellitus with diabetic polyneuropathy: Secondary | ICD-10-CM | POA: Diagnosis not present

## 2023-05-21 DIAGNOSIS — E1122 Type 2 diabetes mellitus with diabetic chronic kidney disease: Secondary | ICD-10-CM | POA: Diagnosis not present

## 2023-06-15 DIAGNOSIS — L97522 Non-pressure chronic ulcer of other part of left foot with fat layer exposed: Secondary | ICD-10-CM | POA: Diagnosis not present

## 2023-06-25 DIAGNOSIS — T1490XA Injury, unspecified, initial encounter: Secondary | ICD-10-CM | POA: Diagnosis not present

## 2023-06-25 DIAGNOSIS — R5381 Other malaise: Secondary | ICD-10-CM | POA: Diagnosis not present

## 2023-06-25 DIAGNOSIS — W19XXXA Unspecified fall, initial encounter: Secondary | ICD-10-CM | POA: Diagnosis not present

## 2023-07-01 DIAGNOSIS — Z6836 Body mass index (BMI) 36.0-36.9, adult: Secondary | ICD-10-CM | POA: Diagnosis not present

## 2023-07-01 DIAGNOSIS — M1712 Unilateral primary osteoarthritis, left knee: Secondary | ICD-10-CM | POA: Diagnosis not present

## 2023-07-01 DIAGNOSIS — E1122 Type 2 diabetes mellitus with diabetic chronic kidney disease: Secondary | ICD-10-CM | POA: Diagnosis not present

## 2023-07-01 DIAGNOSIS — M1711 Unilateral primary osteoarthritis, right knee: Secondary | ICD-10-CM | POA: Diagnosis not present

## 2023-07-01 DIAGNOSIS — R030 Elevated blood-pressure reading, without diagnosis of hypertension: Secondary | ICD-10-CM | POA: Diagnosis not present

## 2023-07-09 DIAGNOSIS — E1122 Type 2 diabetes mellitus with diabetic chronic kidney disease: Secondary | ICD-10-CM | POA: Diagnosis not present

## 2023-07-09 DIAGNOSIS — M1712 Unilateral primary osteoarthritis, left knee: Secondary | ICD-10-CM | POA: Diagnosis not present

## 2023-07-09 DIAGNOSIS — M1711 Unilateral primary osteoarthritis, right knee: Secondary | ICD-10-CM | POA: Diagnosis not present

## 2023-07-09 DIAGNOSIS — Z6837 Body mass index (BMI) 37.0-37.9, adult: Secondary | ICD-10-CM | POA: Diagnosis not present

## 2023-07-09 DIAGNOSIS — R030 Elevated blood-pressure reading, without diagnosis of hypertension: Secondary | ICD-10-CM | POA: Diagnosis not present

## 2023-07-15 DIAGNOSIS — N390 Urinary tract infection, site not specified: Secondary | ICD-10-CM | POA: Diagnosis not present

## 2023-07-17 DIAGNOSIS — R030 Elevated blood-pressure reading, without diagnosis of hypertension: Secondary | ICD-10-CM | POA: Diagnosis not present

## 2023-07-17 DIAGNOSIS — Z6836 Body mass index (BMI) 36.0-36.9, adult: Secondary | ICD-10-CM | POA: Diagnosis not present

## 2023-07-17 DIAGNOSIS — M1711 Unilateral primary osteoarthritis, right knee: Secondary | ICD-10-CM | POA: Diagnosis not present

## 2023-07-17 DIAGNOSIS — M1712 Unilateral primary osteoarthritis, left knee: Secondary | ICD-10-CM | POA: Diagnosis not present

## 2023-07-17 DIAGNOSIS — E1122 Type 2 diabetes mellitus with diabetic chronic kidney disease: Secondary | ICD-10-CM | POA: Diagnosis not present

## 2023-07-20 DIAGNOSIS — Z9181 History of falling: Secondary | ICD-10-CM | POA: Diagnosis not present

## 2023-07-20 DIAGNOSIS — M6281 Muscle weakness (generalized): Secondary | ICD-10-CM | POA: Diagnosis not present

## 2023-07-21 DIAGNOSIS — N183 Chronic kidney disease, stage 3 unspecified: Secondary | ICD-10-CM | POA: Diagnosis not present

## 2023-07-21 DIAGNOSIS — I10 Essential (primary) hypertension: Secondary | ICD-10-CM | POA: Diagnosis not present

## 2023-07-21 DIAGNOSIS — R2681 Unsteadiness on feet: Secondary | ICD-10-CM | POA: Diagnosis not present

## 2023-07-21 DIAGNOSIS — G4733 Obstructive sleep apnea (adult) (pediatric): Secondary | ICD-10-CM | POA: Diagnosis not present

## 2023-07-21 DIAGNOSIS — R32 Unspecified urinary incontinence: Secondary | ICD-10-CM | POA: Diagnosis not present

## 2023-07-21 DIAGNOSIS — R296 Repeated falls: Secondary | ICD-10-CM | POA: Diagnosis not present

## 2023-07-21 DIAGNOSIS — E1122 Type 2 diabetes mellitus with diabetic chronic kidney disease: Secondary | ICD-10-CM | POA: Diagnosis not present

## 2023-07-21 DIAGNOSIS — G6289 Other specified polyneuropathies: Secondary | ICD-10-CM | POA: Diagnosis not present

## 2023-07-21 DIAGNOSIS — E7849 Other hyperlipidemia: Secondary | ICD-10-CM | POA: Diagnosis not present

## 2023-07-21 DIAGNOSIS — E782 Mixed hyperlipidemia: Secondary | ICD-10-CM | POA: Diagnosis not present

## 2023-07-21 DIAGNOSIS — M6281 Muscle weakness (generalized): Secondary | ICD-10-CM | POA: Diagnosis not present

## 2023-07-21 DIAGNOSIS — K219 Gastro-esophageal reflux disease without esophagitis: Secondary | ICD-10-CM | POA: Diagnosis not present

## 2023-07-21 DIAGNOSIS — M810 Age-related osteoporosis without current pathological fracture: Secondary | ICD-10-CM | POA: Diagnosis not present

## 2023-07-23 DIAGNOSIS — G6289 Other specified polyneuropathies: Secondary | ICD-10-CM | POA: Diagnosis not present

## 2023-07-23 DIAGNOSIS — G4733 Obstructive sleep apnea (adult) (pediatric): Secondary | ICD-10-CM | POA: Diagnosis not present

## 2023-07-23 DIAGNOSIS — M810 Age-related osteoporosis without current pathological fracture: Secondary | ICD-10-CM | POA: Diagnosis not present

## 2023-07-23 DIAGNOSIS — I10 Essential (primary) hypertension: Secondary | ICD-10-CM | POA: Diagnosis not present

## 2023-07-23 DIAGNOSIS — R2681 Unsteadiness on feet: Secondary | ICD-10-CM | POA: Diagnosis not present

## 2023-07-23 DIAGNOSIS — R32 Unspecified urinary incontinence: Secondary | ICD-10-CM | POA: Diagnosis not present

## 2023-07-23 DIAGNOSIS — E1122 Type 2 diabetes mellitus with diabetic chronic kidney disease: Secondary | ICD-10-CM | POA: Diagnosis not present

## 2023-07-23 DIAGNOSIS — R296 Repeated falls: Secondary | ICD-10-CM | POA: Diagnosis not present

## 2023-07-23 DIAGNOSIS — M6281 Muscle weakness (generalized): Secondary | ICD-10-CM | POA: Diagnosis not present

## 2023-07-23 DIAGNOSIS — N183 Chronic kidney disease, stage 3 unspecified: Secondary | ICD-10-CM | POA: Diagnosis not present

## 2023-07-23 DIAGNOSIS — Z9181 History of falling: Secondary | ICD-10-CM | POA: Diagnosis not present

## 2023-07-24 DIAGNOSIS — Z9181 History of falling: Secondary | ICD-10-CM | POA: Diagnosis not present

## 2023-07-24 DIAGNOSIS — M6281 Muscle weakness (generalized): Secondary | ICD-10-CM | POA: Diagnosis not present

## 2023-07-27 DIAGNOSIS — B351 Tinea unguium: Secondary | ICD-10-CM | POA: Diagnosis not present

## 2023-07-27 DIAGNOSIS — L851 Acquired keratosis [keratoderma] palmaris et plantaris: Secondary | ICD-10-CM | POA: Diagnosis not present

## 2023-07-27 DIAGNOSIS — E1142 Type 2 diabetes mellitus with diabetic polyneuropathy: Secondary | ICD-10-CM | POA: Diagnosis not present

## 2023-07-28 DIAGNOSIS — K7581 Nonalcoholic steatohepatitis (NASH): Secondary | ICD-10-CM | POA: Diagnosis not present

## 2023-07-28 DIAGNOSIS — E1140 Type 2 diabetes mellitus with diabetic neuropathy, unspecified: Secondary | ICD-10-CM | POA: Diagnosis not present

## 2023-07-28 DIAGNOSIS — Z23 Encounter for immunization: Secondary | ICD-10-CM | POA: Diagnosis not present

## 2023-07-28 DIAGNOSIS — E559 Vitamin D deficiency, unspecified: Secondary | ICD-10-CM | POA: Diagnosis not present

## 2023-07-28 DIAGNOSIS — E7849 Other hyperlipidemia: Secondary | ICD-10-CM | POA: Diagnosis not present

## 2023-07-28 DIAGNOSIS — E11621 Type 2 diabetes mellitus with foot ulcer: Secondary | ICD-10-CM | POA: Diagnosis not present

## 2023-07-28 DIAGNOSIS — Z0001 Encounter for general adult medical examination with abnormal findings: Secondary | ICD-10-CM | POA: Diagnosis not present

## 2023-07-28 DIAGNOSIS — R4582 Worries: Secondary | ICD-10-CM | POA: Diagnosis not present

## 2023-07-28 DIAGNOSIS — I10 Essential (primary) hypertension: Secondary | ICD-10-CM | POA: Diagnosis not present

## 2023-07-28 DIAGNOSIS — E538 Deficiency of other specified B group vitamins: Secondary | ICD-10-CM | POA: Diagnosis not present

## 2023-07-28 DIAGNOSIS — E1122 Type 2 diabetes mellitus with diabetic chronic kidney disease: Secondary | ICD-10-CM | POA: Diagnosis not present

## 2023-08-04 DIAGNOSIS — M6281 Muscle weakness (generalized): Secondary | ICD-10-CM | POA: Diagnosis not present

## 2023-08-04 DIAGNOSIS — Z9181 History of falling: Secondary | ICD-10-CM | POA: Diagnosis not present

## 2023-08-04 DIAGNOSIS — R32 Unspecified urinary incontinence: Secondary | ICD-10-CM | POA: Diagnosis not present

## 2023-08-04 DIAGNOSIS — R296 Repeated falls: Secondary | ICD-10-CM | POA: Diagnosis not present

## 2023-08-04 DIAGNOSIS — G4733 Obstructive sleep apnea (adult) (pediatric): Secondary | ICD-10-CM | POA: Diagnosis not present

## 2023-08-04 DIAGNOSIS — R2681 Unsteadiness on feet: Secondary | ICD-10-CM | POA: Diagnosis not present

## 2023-08-04 DIAGNOSIS — G6289 Other specified polyneuropathies: Secondary | ICD-10-CM | POA: Diagnosis not present

## 2023-08-04 DIAGNOSIS — M810 Age-related osteoporosis without current pathological fracture: Secondary | ICD-10-CM | POA: Diagnosis not present

## 2023-08-04 DIAGNOSIS — I10 Essential (primary) hypertension: Secondary | ICD-10-CM | POA: Diagnosis not present

## 2023-08-04 DIAGNOSIS — E1122 Type 2 diabetes mellitus with diabetic chronic kidney disease: Secondary | ICD-10-CM | POA: Diagnosis not present

## 2023-08-04 DIAGNOSIS — N183 Chronic kidney disease, stage 3 unspecified: Secondary | ICD-10-CM | POA: Diagnosis not present

## 2023-08-05 DIAGNOSIS — M6281 Muscle weakness (generalized): Secondary | ICD-10-CM | POA: Diagnosis not present

## 2023-08-05 DIAGNOSIS — Z9181 History of falling: Secondary | ICD-10-CM | POA: Diagnosis not present

## 2023-08-06 DIAGNOSIS — N183 Chronic kidney disease, stage 3 unspecified: Secondary | ICD-10-CM | POA: Diagnosis not present

## 2023-08-06 DIAGNOSIS — E1122 Type 2 diabetes mellitus with diabetic chronic kidney disease: Secondary | ICD-10-CM | POA: Diagnosis not present

## 2023-08-06 DIAGNOSIS — G6289 Other specified polyneuropathies: Secondary | ICD-10-CM | POA: Diagnosis not present

## 2023-08-06 DIAGNOSIS — R32 Unspecified urinary incontinence: Secondary | ICD-10-CM | POA: Diagnosis not present

## 2023-08-06 DIAGNOSIS — G4733 Obstructive sleep apnea (adult) (pediatric): Secondary | ICD-10-CM | POA: Diagnosis not present

## 2023-08-06 DIAGNOSIS — Z9181 History of falling: Secondary | ICD-10-CM | POA: Diagnosis not present

## 2023-08-06 DIAGNOSIS — M810 Age-related osteoporosis without current pathological fracture: Secondary | ICD-10-CM | POA: Diagnosis not present

## 2023-08-06 DIAGNOSIS — I10 Essential (primary) hypertension: Secondary | ICD-10-CM | POA: Diagnosis not present

## 2023-08-06 DIAGNOSIS — R2681 Unsteadiness on feet: Secondary | ICD-10-CM | POA: Diagnosis not present

## 2023-08-06 DIAGNOSIS — M6281 Muscle weakness (generalized): Secondary | ICD-10-CM | POA: Diagnosis not present

## 2023-08-06 DIAGNOSIS — R296 Repeated falls: Secondary | ICD-10-CM | POA: Diagnosis not present

## 2023-08-07 DIAGNOSIS — Z9181 History of falling: Secondary | ICD-10-CM | POA: Diagnosis not present

## 2023-08-07 DIAGNOSIS — M6281 Muscle weakness (generalized): Secondary | ICD-10-CM | POA: Diagnosis not present

## 2023-08-08 ENCOUNTER — Emergency Department (HOSPITAL_COMMUNITY): Payer: Medicare Other

## 2023-08-08 ENCOUNTER — Emergency Department (HOSPITAL_COMMUNITY)
Admission: EM | Admit: 2023-08-08 | Discharge: 2023-08-08 | Disposition: A | Discharge status: Home / Self Care | Payer: Medicare Other | Attending: Emergency Medicine | Admitting: Emergency Medicine

## 2023-08-08 ENCOUNTER — Encounter (HOSPITAL_COMMUNITY): Payer: Self-pay | Admitting: Emergency Medicine

## 2023-08-08 ENCOUNTER — Other Ambulatory Visit: Payer: Self-pay

## 2023-08-08 DIAGNOSIS — M79672 Pain in left foot: Secondary | ICD-10-CM | POA: Insufficient documentation

## 2023-08-08 DIAGNOSIS — R350 Frequency of micturition: Secondary | ICD-10-CM | POA: Diagnosis present

## 2023-08-08 DIAGNOSIS — N3000 Acute cystitis without hematuria: Secondary | ICD-10-CM | POA: Diagnosis not present

## 2023-08-08 DIAGNOSIS — E039 Hypothyroidism, unspecified: Secondary | ICD-10-CM | POA: Diagnosis not present

## 2023-08-08 DIAGNOSIS — Z794 Long term (current) use of insulin: Secondary | ICD-10-CM | POA: Diagnosis not present

## 2023-08-08 DIAGNOSIS — M19072 Primary osteoarthritis, left ankle and foot: Secondary | ICD-10-CM | POA: Diagnosis not present

## 2023-08-08 DIAGNOSIS — G8929 Other chronic pain: Secondary | ICD-10-CM | POA: Insufficient documentation

## 2023-08-08 DIAGNOSIS — R609 Edema, unspecified: Secondary | ICD-10-CM | POA: Diagnosis not present

## 2023-08-08 DIAGNOSIS — Z79899 Other long term (current) drug therapy: Secondary | ICD-10-CM | POA: Insufficient documentation

## 2023-08-08 DIAGNOSIS — Z79890 Hormone replacement therapy (postmenopausal): Secondary | ICD-10-CM | POA: Diagnosis not present

## 2023-08-08 DIAGNOSIS — Z7984 Long term (current) use of oral hypoglycemic drugs: Secondary | ICD-10-CM | POA: Insufficient documentation

## 2023-08-08 DIAGNOSIS — I10 Essential (primary) hypertension: Secondary | ICD-10-CM | POA: Diagnosis not present

## 2023-08-08 DIAGNOSIS — N390 Urinary tract infection, site not specified: Secondary | ICD-10-CM | POA: Diagnosis not present

## 2023-08-08 DIAGNOSIS — E119 Type 2 diabetes mellitus without complications: Secondary | ICD-10-CM | POA: Diagnosis not present

## 2023-08-08 DIAGNOSIS — Z472 Encounter for removal of internal fixation device: Secondary | ICD-10-CM | POA: Diagnosis not present

## 2023-08-08 DIAGNOSIS — M2142 Flat foot [pes planus] (acquired), left foot: Secondary | ICD-10-CM | POA: Diagnosis not present

## 2023-08-08 LAB — URINALYSIS, ROUTINE W REFLEX MICROSCOPIC
Bilirubin Urine: NEGATIVE
Glucose, UA: NEGATIVE mg/dL
Hgb urine dipstick: NEGATIVE
Ketones, ur: NEGATIVE mg/dL
Nitrite: POSITIVE — AB
Protein, ur: NEGATIVE mg/dL
Specific Gravity, Urine: 1.009 (ref 1.005–1.030)
pH: 6 (ref 5.0–8.0)

## 2023-08-08 MED ORDER — CEPHALEXIN 500 MG PO CAPS
500.0000 mg | ORAL_CAPSULE | Freq: Once | ORAL | Status: AC
  Administered 2023-08-08: 500 mg via ORAL
  Filled 2023-08-08: qty 1

## 2023-08-08 MED ORDER — CEPHALEXIN 500 MG PO CAPS: 500.0000 mg | ORAL_CAPSULE | Freq: Four times a day (QID) | ORAL | 0 refills | Status: DC

## 2023-08-08 NOTE — ED Provider Notes (Signed)
[place] EMERGENCY DEPARTMENT AT [facility] Provider Note   CSN: [id number] Arrival date & time: 08/08/23  1435     History  Chief Complaint  Patient presents with   Urinary Frequency   Leg Swelling    Kathryn Ryan is a 82 y.o. female.  Patient sent in from [facility] with the concerns for possible urinary tract infection urine has been foul-smelling for 2 weeks.  Patient has had a history of UTI's in the past.  Last 1 documented by [place] back in March.  Patient also with chronic left foot pain.  No new fall or injury.  Patient has significant injury to that foot in the past had hardware at 1 point in time that was removed.  Nothing new or worse about this.  Patient has a history of being a DNR which was confirmed with patient but paperwork not sent in from nursing facility.  Past medical history sniffer hypertension hypothyroidism neuropathy and diabetes.  Past surgical history significant for abdominal hysterectomy hardware removal in 2015.  Patient former smoker quit 1996.       Home Medications Prior to Admission medications   Medication Sig Start Date End Date Taking? Authorizing Provider  cephALEXin (KEFLEX) 500 MG capsule Take 1 capsule (500 mg total) by mouth 4 (four) times daily. 08/08/23  Yes Vanetta Mulders, MD  atorvastatin (LIPITOR) 10 MG tablet Take 10 mg by mouth every morning.    [provider]  cephALEXin (KEFLEX) 500 MG capsule Take 1 capsule (500 mg total) by mouth 4 (four) times daily. 03/07/23   Theron Arista, PA-C  cholecalciferol (VITAMIN D3) 25 MCG (1000 UNIT) tablet Take 2,000 Units by mouth every morning.    [provider]  Cranberry 450 MG CAPS Take 450 mg by mouth every morning.    [provider]  DULoxetine (CYMBALTA) 60 MG capsule Take 60 mg by mouth 2 (two) times daily.    [provider]  Exenatide ER (BYDUREON) 2 MG PEN Inject 2 mg into the skin every Thursday.    [provider]  HYDROcodone-acetaminophen (VICODIN) 5-325 mg TABS tablet Take 1 tablet by mouth at bedtime.    [provider]  insulin glargine (LANTUS) 100 UNIT/ML injection Inject 120 Units into the skin at bedtime.    [provider]  levothyroxine (SYNTHROID, LEVOTHROID) 150 MCG tablet Take 150 mcg by mouth daily before breakfast.    [provider]  Melatonin 12 MG TABS Take 12 mg by mouth at bedtime.    [provider]  metFORMIN (GLUCOPHAGE-XR) 500 MG 24 hr tablet Take 500 mg by mouth at bedtime. 06/03/16 03/07/23  [provider]  omeprazole (PRILOSEC) 20 MG capsule Take 20 mg by mouth every morning.    [provider]  pregabalin (LYRICA) 200 MG capsule Take 200 mg by mouth 2 (two) times daily.    [provider]  traZODone (DESYREL) 100 MG tablet Take 100 mg by mouth at bedtime.    [provider]      Allergies    Statins and Sulfa antibiotics    Review of Systems   Review of Systems  Constitutional:  Negative for chills and fever.  HENT:  Negative for ear pain and sore throat.   Eyes:  Negative for pain and visual disturbance.  Respiratory:  Negative for cough and shortness of breath.   Cardiovascular:  Negative for chest pain and palpitations.  Gastrointestinal:  Negative for abdominal pain and  vomiting.  Genitourinary:  Positive for dysuria. Negative for hematuria.  Musculoskeletal:  Positive for joint swelling. Negative for arthralgias and back pain.  Skin:  Negative for color change and rash.  Neurological:  Negative for seizures and syncope.  All other systems reviewed and are negative.   Physical Exam Updated Vital Signs BP (!) 133/90   Pulse 89   Temp 98 F (36.7 C)   Resp 18   SpO2 94%  Physical Exam Vitals and nursing note reviewed.  Constitutional:      General: She is not in acute distress.    Appearance: Normal appearance. She is well-developed.  HENT:     Head: Normocephalic and atraumatic.      Mouth/Throat:     Mouth: Mucous membranes are moist.  Eyes:     Extraocular Movements: Extraocular movements intact.     Conjunctiva/sclera: Conjunctivae normal.     Pupils: Pupils are equal, round, and reactive to light.  Cardiovascular:     Rate and Rhythm: Normal rate and regular rhythm.     Heart sounds: No murmur heard. Pulmonary:     Effort: Pulmonary effort is normal. No respiratory distress.     Breath sounds: Normal breath sounds.  Abdominal:     Palpations: Abdomen is soft.     Tenderness: There is no abdominal tenderness.  Musculoskeletal:        General: No swelling.     Cervical back: Normal range of motion and neck supple.  Skin:    General: Skin is warm and dry.     Capillary Refill: Capillary refill takes less than 2 seconds.  Neurological:     Mental Status: She is alert.  Psychiatric:        Mood and Affect: Mood normal.     ED Results / Procedures / Treatments   Labs (all labs ordered are listed, but only abnormal results are displayed) Labs Reviewed  URINALYSIS, ROUTINE W REFLEX MICROSCOPIC - Abnormal; Notable for the following components:      Result Value   Nitrite POSITIVE (*)    Leukocytes,Ua SMALL (*)    Bacteria, UA RARE (*)    All other components within normal limits  URINE CULTURE    EKG None  Radiology DG Ankle Complete Left  Result Date: 08/08/2023 CLINICAL DATA:  Chronic left ankle with deformity. Suspect significant arthritis. EXAM: LEFT ANKLE COMPLETE - 3+ VIEW COMPARISON:  Left ankle radiographs 04/25/2014, MRI left ankle 04/25/2014 FINDINGS: Interval removal of the prior fractured subtalar joint screws and interval removal of the prior calcaneocuboid screws. The ankle mortise remains symmetric and intact. There is chronic subtalar and midfoot high-grade joint space narrowing and partial collapse, resulting in mild high-grade pes planus. Moderate plantar calcaneal heel spur. No acute fracture is seen. No dislocation. Partial  visualization of two small screws within the fifth metatarsal shaft and a single screw within the distal first metatarsal shaft. IMPRESSION: Compared to 04/25/2014: 1. Interval removal of the prior fractured subtalar joint screws and interval removal of the prior calcaneocuboid screws. Note is made there was concern for abscesses and septic arthritis at the time of the prior radiographs, seen on same day 04/25/2014 MRI. 2. Chronic subtalar and midfoot high-grade joint space narrowing and partial collapse, resulting in mild high-grade pes planus. 3. No acute fracture is seen. Electronically Signed   By: Neita Garnet M.D.   On: 08/08/2023 18:03    Procedures Procedures    Medications Ordered in ED Medications  cephALEXin (KEFLEX)  capsule 500 mg (500 mg Oral Given 08/08/23 1752)    ED Course/ Medical Decision Making/ A&P                                 Medical Decision Making Amount and/or Complexity of Data Reviewed Labs: ordered. Radiology: ordered.  Risk Prescription drug management.  Urinalysis here today suggestive of urinary tract infection started with Keflex here prescription for Ceftin provided to take for the next 7 days.  Urine sent for culture.  X-ray of the left foot shows really just sort of chronic changes nothing acute.  Would recommend following up with orthopedics referral to local orthopedics provided.  Final Clinical Impression(s) / ED Diagnoses Final diagnoses:  Acute cystitis without hematuria  Chronic foot pain, left    Rx / DC Orders ED Discharge Orders          Ordered    cephALEXin (KEFLEX) 500 MG capsule  4 times daily        08/08/23 1830              Vanetta Mulders, MD 08/08/23 1844

## 2023-08-08 NOTE — Discharge Instructions (Signed)
Urinalysis today consistent with urinary tract infection.  Take the prescription Keflex as directed for the next 7 days.  Urine sent for culture.  To verify the type of bacterial infection and whether Keflex is appropriate.  Make an appointment to follow-up with orthopedics locally for the chronic left foot pain.  X-ray without any acute findings.

## 2023-08-08 NOTE — ED Notes (Signed)
Spoke with Pt son Brynda Greathouse- son stated the facility requested he call to get report and let them know. Per son pt will need transportation arranged to get back to facility. Full and detailed reporte given, AVS read to pt son. No other questions at this time.

## 2023-08-08 NOTE — ED Notes (Signed)
Called [facility] to give report. No answer.

## 2023-08-08 NOTE — ED Triage Notes (Signed)
PT bib EMS from [facility] for complaints of left foot swelling that is new today. Pt complains of frequent urination and foul smell to urine x 2 weeks. Pt also has bright red in right eye today. Denies any injury.   Pt is DNR but facility unable to find DNR form and requesting new form while in ED.

## 2023-08-10 LAB — URINE CULTURE: Culture: >=100000 — AB

## 2023-08-11 ENCOUNTER — Telehealth (HOSPITAL_BASED_OUTPATIENT_CLINIC_OR_DEPARTMENT_OTHER): Payer: Self-pay | Admitting: *Deleted

## 2023-08-11 DIAGNOSIS — M810 Age-related osteoporosis without current pathological fracture: Secondary | ICD-10-CM | POA: Diagnosis not present

## 2023-08-11 DIAGNOSIS — R296 Repeated falls: Secondary | ICD-10-CM | POA: Diagnosis not present

## 2023-08-11 DIAGNOSIS — Z9181 History of falling: Secondary | ICD-10-CM | POA: Diagnosis not present

## 2023-08-11 DIAGNOSIS — R2681 Unsteadiness on feet: Secondary | ICD-10-CM | POA: Diagnosis not present

## 2023-08-11 DIAGNOSIS — R32 Unspecified urinary incontinence: Secondary | ICD-10-CM | POA: Diagnosis not present

## 2023-08-11 DIAGNOSIS — G6289 Other specified polyneuropathies: Secondary | ICD-10-CM | POA: Diagnosis not present

## 2023-08-11 DIAGNOSIS — G4733 Obstructive sleep apnea (adult) (pediatric): Secondary | ICD-10-CM | POA: Diagnosis not present

## 2023-08-11 DIAGNOSIS — E1122 Type 2 diabetes mellitus with diabetic chronic kidney disease: Secondary | ICD-10-CM | POA: Diagnosis not present

## 2023-08-11 DIAGNOSIS — M6281 Muscle weakness (generalized): Secondary | ICD-10-CM | POA: Diagnosis not present

## 2023-08-11 DIAGNOSIS — I10 Essential (primary) hypertension: Secondary | ICD-10-CM | POA: Diagnosis not present

## 2023-08-11 DIAGNOSIS — N183 Chronic kidney disease, stage 3 unspecified: Secondary | ICD-10-CM | POA: Diagnosis not present

## 2023-08-11 NOTE — Telephone Encounter (Signed)
Post ED Visit - Positive Culture Follow-up  Culture report reviewed by antimicrobial stewardship pharmacist: [facility] []  [address] Pharm.D. [x]  Celedonio Miyamoto, Pharm.D., BCPS AQ-ID []  Garvin Fila, Pharm.D., BCPS []  Georgina Pillion, Pharm.D., BCPS []  [place], [place].D., BCPS, AAHIVP []  Estella Husk, Pharm.D., BCPS, AAHIVP []  Lysle Pearl, PharmD, BCPS []  Phillips Climes, PharmD, BCPS []  Agapito Games, PharmD, BCPS []  Verlan Friends, PharmD []  Mervyn Gay, PharmD, BCPS []  Vinnie Level, PharmD  [facility] []  Len Childs, PharmD []  Greer Pickerel, PharmD []  Adalberto Cole, PharmD []  Perlie Gold, Rph []  Lonell Face) Jean Rosenthal, PharmD []  Earl Many, PharmD []  Junita Push, PharmD []  Dorna Leitz, PharmD []  Terrilee Files, PharmD []  Lynann Beaver, PharmD []  Keturah Barre, PharmD []  Loralee Pacas, PharmD []  Bernadene Person, PharmD   Positive urine culture Treated with Cephalexin, organism sensitive to the same and no further patient follow-up is required at this time.  Virl Axe [facility] 08/11/2023, 9:24 AM

## 2023-08-13 DIAGNOSIS — R32 Unspecified urinary incontinence: Secondary | ICD-10-CM | POA: Diagnosis not present

## 2023-08-13 DIAGNOSIS — Z9181 History of falling: Secondary | ICD-10-CM | POA: Diagnosis not present

## 2023-08-13 DIAGNOSIS — G6289 Other specified polyneuropathies: Secondary | ICD-10-CM | POA: Diagnosis not present

## 2023-08-13 DIAGNOSIS — R296 Repeated falls: Secondary | ICD-10-CM | POA: Diagnosis not present

## 2023-08-13 DIAGNOSIS — M6281 Muscle weakness (generalized): Secondary | ICD-10-CM | POA: Diagnosis not present

## 2023-08-13 DIAGNOSIS — N183 Chronic kidney disease, stage 3 unspecified: Secondary | ICD-10-CM | POA: Diagnosis not present

## 2023-08-13 DIAGNOSIS — R2681 Unsteadiness on feet: Secondary | ICD-10-CM | POA: Diagnosis not present

## 2023-08-13 DIAGNOSIS — G4733 Obstructive sleep apnea (adult) (pediatric): Secondary | ICD-10-CM | POA: Diagnosis not present

## 2023-08-13 DIAGNOSIS — I10 Essential (primary) hypertension: Secondary | ICD-10-CM | POA: Diagnosis not present

## 2023-08-13 DIAGNOSIS — E1122 Type 2 diabetes mellitus with diabetic chronic kidney disease: Secondary | ICD-10-CM | POA: Diagnosis not present

## 2023-08-13 DIAGNOSIS — M810 Age-related osteoporosis without current pathological fracture: Secondary | ICD-10-CM | POA: Diagnosis not present

## 2023-08-14 DIAGNOSIS — Z9181 History of falling: Secondary | ICD-10-CM | POA: Diagnosis not present

## 2023-08-14 DIAGNOSIS — M6281 Muscle weakness (generalized): Secondary | ICD-10-CM | POA: Diagnosis not present

## 2023-08-17 DIAGNOSIS — M6281 Muscle weakness (generalized): Secondary | ICD-10-CM | POA: Diagnosis not present

## 2023-08-17 DIAGNOSIS — Z9181 History of falling: Secondary | ICD-10-CM | POA: Diagnosis not present

## 2023-08-18 DIAGNOSIS — Z9181 History of falling: Secondary | ICD-10-CM | POA: Diagnosis not present

## 2023-08-18 DIAGNOSIS — N183 Chronic kidney disease, stage 3 unspecified: Secondary | ICD-10-CM | POA: Diagnosis not present

## 2023-08-18 DIAGNOSIS — M810 Age-related osteoporosis without current pathological fracture: Secondary | ICD-10-CM | POA: Diagnosis not present

## 2023-08-18 DIAGNOSIS — M6281 Muscle weakness (generalized): Secondary | ICD-10-CM | POA: Diagnosis not present

## 2023-08-18 DIAGNOSIS — R296 Repeated falls: Secondary | ICD-10-CM | POA: Diagnosis not present

## 2023-08-18 DIAGNOSIS — G4733 Obstructive sleep apnea (adult) (pediatric): Secondary | ICD-10-CM | POA: Diagnosis not present

## 2023-08-18 DIAGNOSIS — G6289 Other specified polyneuropathies: Secondary | ICD-10-CM | POA: Diagnosis not present

## 2023-08-18 DIAGNOSIS — R2681 Unsteadiness on feet: Secondary | ICD-10-CM | POA: Diagnosis not present

## 2023-08-18 DIAGNOSIS — R32 Unspecified urinary incontinence: Secondary | ICD-10-CM | POA: Diagnosis not present

## 2023-08-18 DIAGNOSIS — I10 Essential (primary) hypertension: Secondary | ICD-10-CM | POA: Diagnosis not present

## 2023-08-18 DIAGNOSIS — E1122 Type 2 diabetes mellitus with diabetic chronic kidney disease: Secondary | ICD-10-CM | POA: Diagnosis not present

## 2023-08-19 DIAGNOSIS — Z9181 History of falling: Secondary | ICD-10-CM | POA: Diagnosis not present

## 2023-08-19 DIAGNOSIS — M6281 Muscle weakness (generalized): Secondary | ICD-10-CM | POA: Diagnosis not present

## 2023-08-20 DIAGNOSIS — R32 Unspecified urinary incontinence: Secondary | ICD-10-CM | POA: Diagnosis not present

## 2023-08-20 DIAGNOSIS — R2681 Unsteadiness on feet: Secondary | ICD-10-CM | POA: Diagnosis not present

## 2023-08-20 DIAGNOSIS — N183 Chronic kidney disease, stage 3 unspecified: Secondary | ICD-10-CM | POA: Diagnosis not present

## 2023-08-20 DIAGNOSIS — E1122 Type 2 diabetes mellitus with diabetic chronic kidney disease: Secondary | ICD-10-CM | POA: Diagnosis not present

## 2023-08-20 DIAGNOSIS — R296 Repeated falls: Secondary | ICD-10-CM | POA: Diagnosis not present

## 2023-08-20 DIAGNOSIS — G4733 Obstructive sleep apnea (adult) (pediatric): Secondary | ICD-10-CM | POA: Diagnosis not present

## 2023-08-20 DIAGNOSIS — G6289 Other specified polyneuropathies: Secondary | ICD-10-CM | POA: Diagnosis not present

## 2023-08-20 DIAGNOSIS — M6281 Muscle weakness (generalized): Secondary | ICD-10-CM | POA: Diagnosis not present

## 2023-08-20 DIAGNOSIS — I10 Essential (primary) hypertension: Secondary | ICD-10-CM | POA: Diagnosis not present

## 2023-08-20 DIAGNOSIS — M810 Age-related osteoporosis without current pathological fracture: Secondary | ICD-10-CM | POA: Diagnosis not present

## 2023-08-20 DIAGNOSIS — Z9181 History of falling: Secondary | ICD-10-CM | POA: Diagnosis not present

## 2023-08-24 DIAGNOSIS — M6281 Muscle weakness (generalized): Secondary | ICD-10-CM | POA: Diagnosis not present

## 2023-08-24 DIAGNOSIS — Z9181 History of falling: Secondary | ICD-10-CM | POA: Diagnosis not present

## 2023-08-25 DIAGNOSIS — G4733 Obstructive sleep apnea (adult) (pediatric): Secondary | ICD-10-CM | POA: Diagnosis not present

## 2023-08-25 DIAGNOSIS — E1122 Type 2 diabetes mellitus with diabetic chronic kidney disease: Secondary | ICD-10-CM | POA: Diagnosis not present

## 2023-08-25 DIAGNOSIS — M810 Age-related osteoporosis without current pathological fracture: Secondary | ICD-10-CM | POA: Diagnosis not present

## 2023-08-25 DIAGNOSIS — R2681 Unsteadiness on feet: Secondary | ICD-10-CM | POA: Diagnosis not present

## 2023-08-25 DIAGNOSIS — I10 Essential (primary) hypertension: Secondary | ICD-10-CM | POA: Diagnosis not present

## 2023-08-25 DIAGNOSIS — N183 Chronic kidney disease, stage 3 unspecified: Secondary | ICD-10-CM | POA: Diagnosis not present

## 2023-08-25 DIAGNOSIS — R32 Unspecified urinary incontinence: Secondary | ICD-10-CM | POA: Diagnosis not present

## 2023-08-25 DIAGNOSIS — G6289 Other specified polyneuropathies: Secondary | ICD-10-CM | POA: Diagnosis not present

## 2023-08-25 DIAGNOSIS — R296 Repeated falls: Secondary | ICD-10-CM | POA: Diagnosis not present

## 2023-08-25 DIAGNOSIS — M6281 Muscle weakness (generalized): Secondary | ICD-10-CM | POA: Diagnosis not present

## 2023-08-25 DIAGNOSIS — Z9181 History of falling: Secondary | ICD-10-CM | POA: Diagnosis not present

## 2023-08-26 DIAGNOSIS — Z9181 History of falling: Secondary | ICD-10-CM | POA: Diagnosis not present

## 2023-08-26 DIAGNOSIS — M6281 Muscle weakness (generalized): Secondary | ICD-10-CM | POA: Diagnosis not present

## 2023-08-27 DIAGNOSIS — R296 Repeated falls: Secondary | ICD-10-CM | POA: Diagnosis not present

## 2023-08-27 DIAGNOSIS — M6281 Muscle weakness (generalized): Secondary | ICD-10-CM | POA: Diagnosis not present

## 2023-08-27 DIAGNOSIS — N183 Chronic kidney disease, stage 3 unspecified: Secondary | ICD-10-CM | POA: Diagnosis not present

## 2023-08-27 DIAGNOSIS — R2681 Unsteadiness on feet: Secondary | ICD-10-CM | POA: Diagnosis not present

## 2023-08-27 DIAGNOSIS — M810 Age-related osteoporosis without current pathological fracture: Secondary | ICD-10-CM | POA: Diagnosis not present

## 2023-08-27 DIAGNOSIS — Z9181 History of falling: Secondary | ICD-10-CM | POA: Diagnosis not present

## 2023-08-27 DIAGNOSIS — G4733 Obstructive sleep apnea (adult) (pediatric): Secondary | ICD-10-CM | POA: Diagnosis not present

## 2023-08-27 DIAGNOSIS — I10 Essential (primary) hypertension: Secondary | ICD-10-CM | POA: Diagnosis not present

## 2023-08-27 DIAGNOSIS — R32 Unspecified urinary incontinence: Secondary | ICD-10-CM | POA: Diagnosis not present

## 2023-08-27 DIAGNOSIS — G6289 Other specified polyneuropathies: Secondary | ICD-10-CM | POA: Diagnosis not present

## 2023-08-27 DIAGNOSIS — E1122 Type 2 diabetes mellitus with diabetic chronic kidney disease: Secondary | ICD-10-CM | POA: Diagnosis not present

## 2023-09-01 DIAGNOSIS — R296 Repeated falls: Secondary | ICD-10-CM | POA: Diagnosis not present

## 2023-09-01 DIAGNOSIS — M6281 Muscle weakness (generalized): Secondary | ICD-10-CM | POA: Diagnosis not present

## 2023-09-01 DIAGNOSIS — R32 Unspecified urinary incontinence: Secondary | ICD-10-CM | POA: Diagnosis not present

## 2023-09-01 DIAGNOSIS — G6289 Other specified polyneuropathies: Secondary | ICD-10-CM | POA: Diagnosis not present

## 2023-09-01 DIAGNOSIS — K7581 Nonalcoholic steatohepatitis (NASH): Secondary | ICD-10-CM | POA: Diagnosis not present

## 2023-09-01 DIAGNOSIS — E538 Deficiency of other specified B group vitamins: Secondary | ICD-10-CM | POA: Diagnosis not present

## 2023-09-01 DIAGNOSIS — M810 Age-related osteoporosis without current pathological fracture: Secondary | ICD-10-CM | POA: Diagnosis not present

## 2023-09-01 DIAGNOSIS — E1342 Other specified diabetes mellitus with diabetic polyneuropathy: Secondary | ICD-10-CM | POA: Diagnosis not present

## 2023-09-01 DIAGNOSIS — E1140 Type 2 diabetes mellitus with diabetic neuropathy, unspecified: Secondary | ICD-10-CM | POA: Diagnosis not present

## 2023-09-01 DIAGNOSIS — Z9181 History of falling: Secondary | ICD-10-CM | POA: Diagnosis not present

## 2023-09-01 DIAGNOSIS — E1122 Type 2 diabetes mellitus with diabetic chronic kidney disease: Secondary | ICD-10-CM | POA: Diagnosis not present

## 2023-09-01 DIAGNOSIS — N183 Chronic kidney disease, stage 3 unspecified: Secondary | ICD-10-CM | POA: Diagnosis not present

## 2023-09-01 DIAGNOSIS — R2681 Unsteadiness on feet: Secondary | ICD-10-CM | POA: Diagnosis not present

## 2023-09-01 DIAGNOSIS — E11621 Type 2 diabetes mellitus with foot ulcer: Secondary | ICD-10-CM | POA: Diagnosis not present

## 2023-09-01 DIAGNOSIS — I10 Essential (primary) hypertension: Secondary | ICD-10-CM | POA: Diagnosis not present

## 2023-09-01 DIAGNOSIS — E7849 Other hyperlipidemia: Secondary | ICD-10-CM | POA: Diagnosis not present

## 2023-09-01 DIAGNOSIS — G4733 Obstructive sleep apnea (adult) (pediatric): Secondary | ICD-10-CM | POA: Diagnosis not present

## 2023-09-01 DIAGNOSIS — Z23 Encounter for immunization: Secondary | ICD-10-CM | POA: Diagnosis not present

## 2023-09-03 DIAGNOSIS — N183 Chronic kidney disease, stage 3 unspecified: Secondary | ICD-10-CM | POA: Diagnosis not present

## 2023-09-03 DIAGNOSIS — R2681 Unsteadiness on feet: Secondary | ICD-10-CM | POA: Diagnosis not present

## 2023-09-03 DIAGNOSIS — R32 Unspecified urinary incontinence: Secondary | ICD-10-CM | POA: Diagnosis not present

## 2023-09-03 DIAGNOSIS — G4733 Obstructive sleep apnea (adult) (pediatric): Secondary | ICD-10-CM | POA: Diagnosis not present

## 2023-09-03 DIAGNOSIS — R296 Repeated falls: Secondary | ICD-10-CM | POA: Diagnosis not present

## 2023-09-03 DIAGNOSIS — I10 Essential (primary) hypertension: Secondary | ICD-10-CM | POA: Diagnosis not present

## 2023-09-03 DIAGNOSIS — G6289 Other specified polyneuropathies: Secondary | ICD-10-CM | POA: Diagnosis not present

## 2023-09-03 DIAGNOSIS — M810 Age-related osteoporosis without current pathological fracture: Secondary | ICD-10-CM | POA: Diagnosis not present

## 2023-09-03 DIAGNOSIS — M6281 Muscle weakness (generalized): Secondary | ICD-10-CM | POA: Diagnosis not present

## 2023-09-03 DIAGNOSIS — Z9181 History of falling: Secondary | ICD-10-CM | POA: Diagnosis not present

## 2023-09-03 DIAGNOSIS — E1122 Type 2 diabetes mellitus with diabetic chronic kidney disease: Secondary | ICD-10-CM | POA: Diagnosis not present

## 2023-09-04 DIAGNOSIS — M6281 Muscle weakness (generalized): Secondary | ICD-10-CM | POA: Diagnosis not present

## 2023-09-04 DIAGNOSIS — Z9181 History of falling: Secondary | ICD-10-CM | POA: Diagnosis not present

## 2023-09-07 DIAGNOSIS — M6281 Muscle weakness (generalized): Secondary | ICD-10-CM | POA: Diagnosis not present

## 2023-09-07 DIAGNOSIS — Z9181 History of falling: Secondary | ICD-10-CM | POA: Diagnosis not present

## 2023-09-08 DIAGNOSIS — M6281 Muscle weakness (generalized): Secondary | ICD-10-CM | POA: Diagnosis not present

## 2023-09-08 DIAGNOSIS — E1122 Type 2 diabetes mellitus with diabetic chronic kidney disease: Secondary | ICD-10-CM | POA: Diagnosis not present

## 2023-09-08 DIAGNOSIS — Z9181 History of falling: Secondary | ICD-10-CM | POA: Diagnosis not present

## 2023-09-08 DIAGNOSIS — R296 Repeated falls: Secondary | ICD-10-CM | POA: Diagnosis not present

## 2023-09-08 DIAGNOSIS — M810 Age-related osteoporosis without current pathological fracture: Secondary | ICD-10-CM | POA: Diagnosis not present

## 2023-09-08 DIAGNOSIS — R32 Unspecified urinary incontinence: Secondary | ICD-10-CM | POA: Diagnosis not present

## 2023-09-08 DIAGNOSIS — N183 Chronic kidney disease, stage 3 unspecified: Secondary | ICD-10-CM | POA: Diagnosis not present

## 2023-09-08 DIAGNOSIS — G4733 Obstructive sleep apnea (adult) (pediatric): Secondary | ICD-10-CM | POA: Diagnosis not present

## 2023-09-08 DIAGNOSIS — G6289 Other specified polyneuropathies: Secondary | ICD-10-CM | POA: Diagnosis not present

## 2023-09-08 DIAGNOSIS — I10 Essential (primary) hypertension: Secondary | ICD-10-CM | POA: Diagnosis not present

## 2023-09-08 DIAGNOSIS — R2681 Unsteadiness on feet: Secondary | ICD-10-CM | POA: Diagnosis not present

## 2023-09-10 DIAGNOSIS — N183 Chronic kidney disease, stage 3 unspecified: Secondary | ICD-10-CM | POA: Diagnosis not present

## 2023-09-10 DIAGNOSIS — E1122 Type 2 diabetes mellitus with diabetic chronic kidney disease: Secondary | ICD-10-CM | POA: Diagnosis not present

## 2023-09-10 DIAGNOSIS — G4733 Obstructive sleep apnea (adult) (pediatric): Secondary | ICD-10-CM | POA: Diagnosis not present

## 2023-09-10 DIAGNOSIS — R296 Repeated falls: Secondary | ICD-10-CM | POA: Diagnosis not present

## 2023-09-10 DIAGNOSIS — Z9181 History of falling: Secondary | ICD-10-CM | POA: Diagnosis not present

## 2023-09-10 DIAGNOSIS — R32 Unspecified urinary incontinence: Secondary | ICD-10-CM | POA: Diagnosis not present

## 2023-09-10 DIAGNOSIS — M6281 Muscle weakness (generalized): Secondary | ICD-10-CM | POA: Diagnosis not present

## 2023-09-10 DIAGNOSIS — G6289 Other specified polyneuropathies: Secondary | ICD-10-CM | POA: Diagnosis not present

## 2023-09-10 DIAGNOSIS — R2681 Unsteadiness on feet: Secondary | ICD-10-CM | POA: Diagnosis not present

## 2023-09-10 DIAGNOSIS — I10 Essential (primary) hypertension: Secondary | ICD-10-CM | POA: Diagnosis not present

## 2023-09-10 DIAGNOSIS — M810 Age-related osteoporosis without current pathological fracture: Secondary | ICD-10-CM | POA: Diagnosis not present

## 2023-09-11 DIAGNOSIS — Z9181 History of falling: Secondary | ICD-10-CM | POA: Diagnosis not present

## 2023-09-11 DIAGNOSIS — M6281 Muscle weakness (generalized): Secondary | ICD-10-CM | POA: Diagnosis not present

## 2023-09-14 ENCOUNTER — Other Ambulatory Visit: Payer: Self-pay

## 2023-09-14 ENCOUNTER — Ambulatory Visit (INDEPENDENT_AMBULATORY_CARE_PROVIDER_SITE_OTHER): Payer: Medicare Other | Admitting: Orthopedic Surgery

## 2023-09-14 ENCOUNTER — Other Ambulatory Visit (INDEPENDENT_AMBULATORY_CARE_PROVIDER_SITE_OTHER): Payer: Medicare Other

## 2023-09-14 ENCOUNTER — Encounter: Payer: Self-pay | Admitting: Orthopedic Surgery

## 2023-09-14 VITALS — BP 136/78 | HR 105 | Ht 66.0 in | Wt 242.0 lb

## 2023-09-14 DIAGNOSIS — M170 Bilateral primary osteoarthritis of knee: Secondary | ICD-10-CM

## 2023-09-14 DIAGNOSIS — G8929 Other chronic pain: Secondary | ICD-10-CM

## 2023-09-14 NOTE — Patient Instructions (Addendum)
Follow up with Dr Lequita Halt for the knees [phone] is his phone number you can call to schedule appointment.

## 2023-09-14 NOTE — Progress Notes (Signed)
Office Visit Note   Patient: Kathryn Ryan           Date of Birth: [birth year]           MRN: [id number] Visit Date: 09/14/2023 Requested by: Richardean Chimera, MD [address] [place],  [place] [address] PCP: Richardean Chimera, MD   Assessment & Plan:   Encounter Diagnosis  Name Primary?   Chronic pain of both knees Yes    No orders of the defined types were placed in this encounter.   82 year old female with bilateral knee arthritis right knee varus left knee valgus previously seen by Dr. Merlyn Albert and scheduled for surgery.  However the surgery was canceled due to inadequate postop resources for postop care at home.  She has diabetes hypothyroidism renal failure hypertension sleep apnea obesity and is not a good candidate for surgery here I referred her back to follow-up for definitive management   Subjective: Chief Complaint  Patient presents with   Leg Pain    Entire left leg some right leg pain also pain in back history of 4 back surgeries / states had gel injections in her knees and they didn't help her at all     HPI: 81 year old female with diabetes and hypothyroidism status post multiple back surgeries previously received hyaluronic acid injections in both knees by Dr. Leandrew Koyanagi at the referral of Dr. Garner Nash.  She says the injections did not help she presents with a chief complaint of bilateral leg pain thigh to foot  I asked her if her knees hurt she says yes all the time is painful for her to get out of a seated position and all of her pain is on the medial side  She said she did have x-rays done prior to the hyaluronic acid injection              ROS: She reported everything is normal except for back pain right leg pain  Orthopedic surgeries include arthroscopic rotator cuff repair in 2018 ankle surgery Dr. Victorino Dike 2015 arthroscopic knee surgery 2012 Dr. Terrilee Croak bunionectomy 1996 and 98  Arthrodesis of the left foot 2014 laminectomy Dr. Channing Mutters 2014 looks like it was a TLIF  at L5-S1 Dr. Lovell Sheehan L3-L4 fusion  Addendum looks like Dr. Cleophas Dunker gave her hyaluronic acid injections with Euflexxa in   Images personally read and my interpretation : Right knee varus with grade 4 disease left knee valgus with grade 3/4 disease  Visit Diagnoses:  1. Chronic pain of both knees      Follow-Up Instructions: No follow-ups on file.    Objective: Vital Signs: BP 136/78   Pulse (!) 105   Ht 5\' 6"  (1.676 m)   Wt 242 lb (109.8 kg)   BMI 39.06 kg/m   Physical Exam Vitals and nursing note reviewed.  Constitutional:      Appearance: Normal appearance.  HENT:     Head: Normocephalic and atraumatic.  Eyes:     General: No scleral icterus.       Right eye: No discharge.        Left eye: No discharge.     Extraocular Movements: Extraocular movements intact.     Conjunctiva/sclera: Conjunctivae normal.     Pupils: Pupils are equal, round, and reactive to light.  Cardiovascular:     Rate and Rhythm: Normal rate.     Pulses: Normal pulses.  Skin:    General: Skin is warm and dry.     Capillary Refill: Capillary  refill takes less than 2 seconds.     Findings: Erythema and lesion present.     Comments: Rubor venous stasis disease also has an abrasion on the proximal aspect of the right tibia  Neurological:     General: No focal deficit present.     Mental Status: She is alert and oriented to person, place, and time.  Psychiatric:        Mood and Affect: Mood normal.        Behavior: Behavior normal.        Thought Content: Thought content normal.        Judgment: Judgment normal.      Ortho Exam  Abrasion right proximal tibia She has significant flexion contractures probably 15 degrees but she can flex the knee to about 120 degrees she is tender on the medial side she has crepitance on motion I did not detect joint effusion  On the left side skin is pristine except for the rubor distally from the chronic edema  This knee again flexes about 125 degrees  but has a significant 10 to 15 degree flexion contracture with pain medially and laterally no joint effusion Extensor mechanism is intact Specialty Comments:  No specialty comments available.  Imaging: No results found.   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   Left renal mass 11/22/2020   Left adrenal mass (HCC) 11/22/2020   Chronic pain of right knee 02/23/2019   Unilateral primary osteoarthritis, right knee 02/23/2019   Diabetes (HCC) 08/04/2017   Mixed hyperlipidemia 06/15/2014   Cellulitis of left ankle 04/25/2014   Septic arthritis (HCC) 04/25/2014   Acute osteomyelitis (HCC) 04/25/2014   Hyperglycemia 04/25/2014   HTN (hypertension) 04/25/2014   Infected hardware in left leg (HCC) 04/25/2014   Anemia 10/20/2013   GERD (gastroesophageal reflux disease) 10/19/2013   Chronic kidney disease, unspecified 12/09/1999   Obstructive sleep apnea 12/09/1999   Morbid obesity due to excess calories (HCC) 12/09/1999   Hypothyroidism 12/09/1999   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   Dysmetabolic syndrome X 12/09/1999   Past Medical History:  Diagnosis Date   Anxiety    Diabetes (HCC) 08/04/2017   Diabetes mellitus without complication (HCC)    new onset diabetic    GERD (gastroesophageal reflux disease)    H/O hiatal hernia    Hypertension    Hypothyroidism    Lumbar radiculopathy    Neuropathy    non related to DM-not sure why   Neuropathy    non DM related   Spinal stenosis of lumbar region     Family History  Problem Relation Age of Onset   Breast cancer Mother    Heart disease Father        PPM    Past Surgical History:  Procedure Laterality Date   ABDOMINAL HYSTERECTOMY     BACK SURGERY     BREAST SURGERY     bilateral lumpectomies   BUNIONECTOMY     bilateral   CHOLECYSTECTOMY     HARDWARE REMOVAL Left 04/26/2014   Procedure: HARDWARE  REMOVAL LEFT ANKLE;  Surgeon: Toni Arthurs, MD;  Location: [facility] OR;  Service: Orthopedics;  Laterality: Left;   I & D EXTREMITY Left 04/26/2014   Procedure: IRRIGATION AND DEBRIDEMENT LEFT ANKLE;  Surgeon: Toni Arthurs, MD;  Location: [facility] OR;  Service: Orthopedics;  Laterality: Left;   IRRIGATION AND DEBRIDEMENT ABSCESS Left 04/26/2014  LEFT OOPHORECTOMY     PERIPHERALLY INSERTED CENTRAL CATHETER INSERTION  04/26/2014   rt upper arm   TONSILLECTOMY     6 yrs   Social History   Occupational History   Not on file  Tobacco Use   Smoking status: Former    Current packs/day: 0.00    Average packs/day: 2.0 packs/day for 26.2 years (52.4 ttl pk-yrs)    Types: Cigarettes    Start date: 12/17/1968    Quit date: 02/23/1995    Years since quitting: 28.5   Smokeless tobacco: Never  Vaping Use   Vaping status: Never Used  Substance and Sexual Activity   Alcohol use: No   Drug use: No   Sexual activity: Not Currently

## 2023-09-15 DIAGNOSIS — N183 Chronic kidney disease, stage 3 unspecified: Secondary | ICD-10-CM | POA: Diagnosis not present

## 2023-09-15 DIAGNOSIS — R2681 Unsteadiness on feet: Secondary | ICD-10-CM | POA: Diagnosis not present

## 2023-09-15 DIAGNOSIS — M810 Age-related osteoporosis without current pathological fracture: Secondary | ICD-10-CM | POA: Diagnosis not present

## 2023-09-15 DIAGNOSIS — M6281 Muscle weakness (generalized): Secondary | ICD-10-CM | POA: Diagnosis not present

## 2023-09-15 DIAGNOSIS — Z9181 History of falling: Secondary | ICD-10-CM | POA: Diagnosis not present

## 2023-09-15 DIAGNOSIS — E1122 Type 2 diabetes mellitus with diabetic chronic kidney disease: Secondary | ICD-10-CM | POA: Diagnosis not present

## 2023-09-15 DIAGNOSIS — G6289 Other specified polyneuropathies: Secondary | ICD-10-CM | POA: Diagnosis not present

## 2023-09-15 DIAGNOSIS — I10 Essential (primary) hypertension: Secondary | ICD-10-CM | POA: Diagnosis not present

## 2023-09-15 DIAGNOSIS — R32 Unspecified urinary incontinence: Secondary | ICD-10-CM | POA: Diagnosis not present

## 2023-09-15 DIAGNOSIS — R296 Repeated falls: Secondary | ICD-10-CM | POA: Diagnosis not present

## 2023-09-15 DIAGNOSIS — G4733 Obstructive sleep apnea (adult) (pediatric): Secondary | ICD-10-CM | POA: Diagnosis not present

## 2023-09-16 DIAGNOSIS — M6281 Muscle weakness (generalized): Secondary | ICD-10-CM | POA: Diagnosis not present

## 2023-09-16 DIAGNOSIS — Z9181 History of falling: Secondary | ICD-10-CM | POA: Diagnosis not present

## 2023-09-17 DIAGNOSIS — Z9181 History of falling: Secondary | ICD-10-CM | POA: Diagnosis not present

## 2023-09-17 DIAGNOSIS — M810 Age-related osteoporosis without current pathological fracture: Secondary | ICD-10-CM | POA: Diagnosis not present

## 2023-09-17 DIAGNOSIS — R296 Repeated falls: Secondary | ICD-10-CM | POA: Diagnosis not present

## 2023-09-17 DIAGNOSIS — M6281 Muscle weakness (generalized): Secondary | ICD-10-CM | POA: Diagnosis not present

## 2023-09-17 DIAGNOSIS — I10 Essential (primary) hypertension: Secondary | ICD-10-CM | POA: Diagnosis not present

## 2023-09-17 DIAGNOSIS — R32 Unspecified urinary incontinence: Secondary | ICD-10-CM | POA: Diagnosis not present

## 2023-09-17 DIAGNOSIS — N183 Chronic kidney disease, stage 3 unspecified: Secondary | ICD-10-CM | POA: Diagnosis not present

## 2023-09-17 DIAGNOSIS — G6289 Other specified polyneuropathies: Secondary | ICD-10-CM | POA: Diagnosis not present

## 2023-09-17 DIAGNOSIS — G4733 Obstructive sleep apnea (adult) (pediatric): Secondary | ICD-10-CM | POA: Diagnosis not present

## 2023-09-17 DIAGNOSIS — E1122 Type 2 diabetes mellitus with diabetic chronic kidney disease: Secondary | ICD-10-CM | POA: Diagnosis not present

## 2023-09-17 DIAGNOSIS — R2681 Unsteadiness on feet: Secondary | ICD-10-CM | POA: Diagnosis not present

## 2023-09-21 DIAGNOSIS — M6281 Muscle weakness (generalized): Secondary | ICD-10-CM | POA: Diagnosis not present

## 2023-09-21 DIAGNOSIS — Z9181 History of falling: Secondary | ICD-10-CM | POA: Diagnosis not present

## 2023-09-22 DIAGNOSIS — R32 Unspecified urinary incontinence: Secondary | ICD-10-CM | POA: Diagnosis not present

## 2023-09-22 DIAGNOSIS — G6289 Other specified polyneuropathies: Secondary | ICD-10-CM | POA: Diagnosis not present

## 2023-09-22 DIAGNOSIS — M810 Age-related osteoporosis without current pathological fracture: Secondary | ICD-10-CM | POA: Diagnosis not present

## 2023-09-22 DIAGNOSIS — N183 Chronic kidney disease, stage 3 unspecified: Secondary | ICD-10-CM | POA: Diagnosis not present

## 2023-09-22 DIAGNOSIS — E1122 Type 2 diabetes mellitus with diabetic chronic kidney disease: Secondary | ICD-10-CM | POA: Diagnosis not present

## 2023-09-22 DIAGNOSIS — G4733 Obstructive sleep apnea (adult) (pediatric): Secondary | ICD-10-CM | POA: Diagnosis not present

## 2023-09-22 DIAGNOSIS — R2681 Unsteadiness on feet: Secondary | ICD-10-CM | POA: Diagnosis not present

## 2023-09-22 DIAGNOSIS — I10 Essential (primary) hypertension: Secondary | ICD-10-CM | POA: Diagnosis not present

## 2023-09-22 DIAGNOSIS — M6281 Muscle weakness (generalized): Secondary | ICD-10-CM | POA: Diagnosis not present

## 2023-09-22 DIAGNOSIS — R296 Repeated falls: Secondary | ICD-10-CM | POA: Diagnosis not present

## 2023-09-23 DIAGNOSIS — T148XXA Other injury of unspecified body region, initial encounter: Secondary | ICD-10-CM | POA: Diagnosis not present

## 2023-09-23 DIAGNOSIS — R5381 Other malaise: Secondary | ICD-10-CM | POA: Diagnosis not present

## 2023-09-23 DIAGNOSIS — W19XXXA Unspecified fall, initial encounter: Secondary | ICD-10-CM | POA: Diagnosis not present

## 2023-09-24 DIAGNOSIS — R32 Unspecified urinary incontinence: Secondary | ICD-10-CM | POA: Diagnosis not present

## 2023-09-24 DIAGNOSIS — M810 Age-related osteoporosis without current pathological fracture: Secondary | ICD-10-CM | POA: Diagnosis not present

## 2023-09-24 DIAGNOSIS — G4733 Obstructive sleep apnea (adult) (pediatric): Secondary | ICD-10-CM | POA: Diagnosis not present

## 2023-09-24 DIAGNOSIS — E1122 Type 2 diabetes mellitus with diabetic chronic kidney disease: Secondary | ICD-10-CM | POA: Diagnosis not present

## 2023-09-24 DIAGNOSIS — I10 Essential (primary) hypertension: Secondary | ICD-10-CM | POA: Diagnosis not present

## 2023-09-24 DIAGNOSIS — M6281 Muscle weakness (generalized): Secondary | ICD-10-CM | POA: Diagnosis not present

## 2023-09-24 DIAGNOSIS — N183 Chronic kidney disease, stage 3 unspecified: Secondary | ICD-10-CM | POA: Diagnosis not present

## 2023-09-24 DIAGNOSIS — R296 Repeated falls: Secondary | ICD-10-CM | POA: Diagnosis not present

## 2023-09-24 DIAGNOSIS — Z9181 History of falling: Secondary | ICD-10-CM | POA: Diagnosis not present

## 2023-09-24 DIAGNOSIS — G6289 Other specified polyneuropathies: Secondary | ICD-10-CM | POA: Diagnosis not present

## 2023-09-24 DIAGNOSIS — R2681 Unsteadiness on feet: Secondary | ICD-10-CM | POA: Diagnosis not present

## 2023-09-25 DIAGNOSIS — Z9181 History of falling: Secondary | ICD-10-CM | POA: Diagnosis not present

## 2023-09-25 DIAGNOSIS — M6281 Muscle weakness (generalized): Secondary | ICD-10-CM | POA: Diagnosis not present

## 2023-09-28 DIAGNOSIS — M6281 Muscle weakness (generalized): Secondary | ICD-10-CM | POA: Diagnosis not present

## 2023-09-28 DIAGNOSIS — Z9181 History of falling: Secondary | ICD-10-CM | POA: Diagnosis not present

## 2023-09-29 DIAGNOSIS — I10 Essential (primary) hypertension: Secondary | ICD-10-CM | POA: Diagnosis not present

## 2023-09-29 DIAGNOSIS — G6289 Other specified polyneuropathies: Secondary | ICD-10-CM | POA: Diagnosis not present

## 2023-09-29 DIAGNOSIS — R296 Repeated falls: Secondary | ICD-10-CM | POA: Diagnosis not present

## 2023-09-29 DIAGNOSIS — E1122 Type 2 diabetes mellitus with diabetic chronic kidney disease: Secondary | ICD-10-CM | POA: Diagnosis not present

## 2023-09-29 DIAGNOSIS — R32 Unspecified urinary incontinence: Secondary | ICD-10-CM | POA: Diagnosis not present

## 2023-09-29 DIAGNOSIS — R2681 Unsteadiness on feet: Secondary | ICD-10-CM | POA: Diagnosis not present

## 2023-09-29 DIAGNOSIS — M810 Age-related osteoporosis without current pathological fracture: Secondary | ICD-10-CM | POA: Diagnosis not present

## 2023-09-29 DIAGNOSIS — G4733 Obstructive sleep apnea (adult) (pediatric): Secondary | ICD-10-CM | POA: Diagnosis not present

## 2023-09-29 DIAGNOSIS — M6281 Muscle weakness (generalized): Secondary | ICD-10-CM | POA: Diagnosis not present

## 2023-09-29 DIAGNOSIS — N183 Chronic kidney disease, stage 3 unspecified: Secondary | ICD-10-CM | POA: Diagnosis not present

## 2023-09-30 DIAGNOSIS — Z9181 History of falling: Secondary | ICD-10-CM | POA: Diagnosis not present

## 2023-09-30 DIAGNOSIS — M6281 Muscle weakness (generalized): Secondary | ICD-10-CM | POA: Diagnosis not present

## 2023-10-01 DIAGNOSIS — E1122 Type 2 diabetes mellitus with diabetic chronic kidney disease: Secondary | ICD-10-CM | POA: Diagnosis not present

## 2023-10-01 DIAGNOSIS — I10 Essential (primary) hypertension: Secondary | ICD-10-CM | POA: Diagnosis not present

## 2023-10-01 DIAGNOSIS — Z9181 History of falling: Secondary | ICD-10-CM | POA: Diagnosis not present

## 2023-10-01 DIAGNOSIS — M6281 Muscle weakness (generalized): Secondary | ICD-10-CM | POA: Diagnosis not present

## 2023-10-01 DIAGNOSIS — G4733 Obstructive sleep apnea (adult) (pediatric): Secondary | ICD-10-CM | POA: Diagnosis not present

## 2023-10-01 DIAGNOSIS — R32 Unspecified urinary incontinence: Secondary | ICD-10-CM | POA: Diagnosis not present

## 2023-10-01 DIAGNOSIS — M810 Age-related osteoporosis without current pathological fracture: Secondary | ICD-10-CM | POA: Diagnosis not present

## 2023-10-01 DIAGNOSIS — G6289 Other specified polyneuropathies: Secondary | ICD-10-CM | POA: Diagnosis not present

## 2023-10-01 DIAGNOSIS — N183 Chronic kidney disease, stage 3 unspecified: Secondary | ICD-10-CM | POA: Diagnosis not present

## 2023-10-01 DIAGNOSIS — R296 Repeated falls: Secondary | ICD-10-CM | POA: Diagnosis not present

## 2023-10-01 DIAGNOSIS — R2681 Unsteadiness on feet: Secondary | ICD-10-CM | POA: Diagnosis not present

## 2023-10-02 DIAGNOSIS — R5381 Other malaise: Secondary | ICD-10-CM | POA: Diagnosis not present

## 2023-10-02 DIAGNOSIS — T1490XA Injury, unspecified, initial encounter: Secondary | ICD-10-CM | POA: Diagnosis not present

## 2023-10-02 DIAGNOSIS — W19XXXA Unspecified fall, initial encounter: Secondary | ICD-10-CM | POA: Diagnosis not present

## 2023-10-05 DIAGNOSIS — L03119 Cellulitis of unspecified part of limb: Secondary | ICD-10-CM | POA: Diagnosis not present

## 2023-10-05 DIAGNOSIS — E1142 Type 2 diabetes mellitus with diabetic polyneuropathy: Secondary | ICD-10-CM | POA: Diagnosis not present

## 2023-10-05 DIAGNOSIS — L851 Acquired keratosis [keratoderma] palmaris et plantaris: Secondary | ICD-10-CM | POA: Diagnosis not present

## 2023-10-05 DIAGNOSIS — B351 Tinea unguium: Secondary | ICD-10-CM | POA: Diagnosis not present

## 2023-10-06 DIAGNOSIS — M6281 Muscle weakness (generalized): Secondary | ICD-10-CM | POA: Diagnosis not present

## 2023-10-06 DIAGNOSIS — G6289 Other specified polyneuropathies: Secondary | ICD-10-CM | POA: Diagnosis not present

## 2023-10-06 DIAGNOSIS — R2681 Unsteadiness on feet: Secondary | ICD-10-CM | POA: Diagnosis not present

## 2023-10-06 DIAGNOSIS — G4733 Obstructive sleep apnea (adult) (pediatric): Secondary | ICD-10-CM | POA: Diagnosis not present

## 2023-10-06 DIAGNOSIS — N183 Chronic kidney disease, stage 3 unspecified: Secondary | ICD-10-CM | POA: Diagnosis not present

## 2023-10-06 DIAGNOSIS — R296 Repeated falls: Secondary | ICD-10-CM | POA: Diagnosis not present

## 2023-10-06 DIAGNOSIS — E1122 Type 2 diabetes mellitus with diabetic chronic kidney disease: Secondary | ICD-10-CM | POA: Diagnosis not present

## 2023-10-06 DIAGNOSIS — I10 Essential (primary) hypertension: Secondary | ICD-10-CM | POA: Diagnosis not present

## 2023-10-06 DIAGNOSIS — R32 Unspecified urinary incontinence: Secondary | ICD-10-CM | POA: Diagnosis not present

## 2023-10-06 DIAGNOSIS — M810 Age-related osteoporosis without current pathological fracture: Secondary | ICD-10-CM | POA: Diagnosis not present

## 2023-10-07 DIAGNOSIS — Z9181 History of falling: Secondary | ICD-10-CM | POA: Diagnosis not present

## 2023-10-07 DIAGNOSIS — M6281 Muscle weakness (generalized): Secondary | ICD-10-CM | POA: Diagnosis not present

## 2023-10-08 DIAGNOSIS — M6281 Muscle weakness (generalized): Secondary | ICD-10-CM | POA: Diagnosis not present

## 2023-10-08 DIAGNOSIS — G6289 Other specified polyneuropathies: Secondary | ICD-10-CM | POA: Diagnosis not present

## 2023-10-08 DIAGNOSIS — I10 Essential (primary) hypertension: Secondary | ICD-10-CM | POA: Diagnosis not present

## 2023-10-08 DIAGNOSIS — M810 Age-related osteoporosis without current pathological fracture: Secondary | ICD-10-CM | POA: Diagnosis not present

## 2023-10-08 DIAGNOSIS — R32 Unspecified urinary incontinence: Secondary | ICD-10-CM | POA: Diagnosis not present

## 2023-10-08 DIAGNOSIS — N183 Chronic kidney disease, stage 3 unspecified: Secondary | ICD-10-CM | POA: Diagnosis not present

## 2023-10-08 DIAGNOSIS — R2681 Unsteadiness on feet: Secondary | ICD-10-CM | POA: Diagnosis not present

## 2023-10-08 DIAGNOSIS — G4733 Obstructive sleep apnea (adult) (pediatric): Secondary | ICD-10-CM | POA: Diagnosis not present

## 2023-10-08 DIAGNOSIS — E1122 Type 2 diabetes mellitus with diabetic chronic kidney disease: Secondary | ICD-10-CM | POA: Diagnosis not present

## 2023-10-08 DIAGNOSIS — R296 Repeated falls: Secondary | ICD-10-CM | POA: Diagnosis not present

## 2023-10-09 DIAGNOSIS — M6281 Muscle weakness (generalized): Secondary | ICD-10-CM | POA: Diagnosis not present

## 2023-10-09 DIAGNOSIS — Z9181 History of falling: Secondary | ICD-10-CM | POA: Diagnosis not present

## 2023-10-09 DIAGNOSIS — T1490XA Injury, unspecified, initial encounter: Secondary | ICD-10-CM | POA: Diagnosis not present

## 2023-10-09 DIAGNOSIS — R5381 Other malaise: Secondary | ICD-10-CM | POA: Diagnosis not present

## 2023-10-09 DIAGNOSIS — W19XXXA Unspecified fall, initial encounter: Secondary | ICD-10-CM | POA: Diagnosis not present

## 2023-10-10 DIAGNOSIS — L03115 Cellulitis of right lower limb: Secondary | ICD-10-CM | POA: Diagnosis not present

## 2023-10-10 DIAGNOSIS — L03116 Cellulitis of left lower limb: Secondary | ICD-10-CM | POA: Diagnosis not present

## 2023-10-10 DIAGNOSIS — E1165 Type 2 diabetes mellitus with hyperglycemia: Secondary | ICD-10-CM | POA: Diagnosis not present

## 2023-10-10 DIAGNOSIS — I872 Venous insufficiency (chronic) (peripheral): Secondary | ICD-10-CM | POA: Diagnosis not present

## 2023-10-13 DIAGNOSIS — Z9181 History of falling: Secondary | ICD-10-CM | POA: Diagnosis not present

## 2023-10-13 DIAGNOSIS — M6281 Muscle weakness (generalized): Secondary | ICD-10-CM | POA: Diagnosis not present

## 2023-10-14 DIAGNOSIS — M6281 Muscle weakness (generalized): Secondary | ICD-10-CM | POA: Diagnosis not present

## 2023-10-14 DIAGNOSIS — Z9181 History of falling: Secondary | ICD-10-CM | POA: Diagnosis not present

## 2023-10-15 DIAGNOSIS — G6289 Other specified polyneuropathies: Secondary | ICD-10-CM | POA: Diagnosis not present

## 2023-10-15 DIAGNOSIS — M810 Age-related osteoporosis without current pathological fracture: Secondary | ICD-10-CM | POA: Diagnosis not present

## 2023-10-15 DIAGNOSIS — Z556 Problems related to health literacy: Secondary | ICD-10-CM | POA: Diagnosis not present

## 2023-10-15 DIAGNOSIS — E1122 Type 2 diabetes mellitus with diabetic chronic kidney disease: Secondary | ICD-10-CM | POA: Diagnosis not present

## 2023-10-15 DIAGNOSIS — Z794 Long term (current) use of insulin: Secondary | ICD-10-CM | POA: Diagnosis not present

## 2023-10-15 DIAGNOSIS — M6281 Muscle weakness (generalized): Secondary | ICD-10-CM | POA: Diagnosis not present

## 2023-10-15 DIAGNOSIS — E1165 Type 2 diabetes mellitus with hyperglycemia: Secondary | ICD-10-CM | POA: Diagnosis not present

## 2023-10-15 DIAGNOSIS — R32 Unspecified urinary incontinence: Secondary | ICD-10-CM | POA: Diagnosis not present

## 2023-10-15 DIAGNOSIS — N183 Chronic kidney disease, stage 3 unspecified: Secondary | ICD-10-CM | POA: Diagnosis not present

## 2023-10-15 DIAGNOSIS — S81802D Unspecified open wound, left lower leg, subsequent encounter: Secondary | ICD-10-CM | POA: Diagnosis not present

## 2023-10-15 DIAGNOSIS — R2681 Unsteadiness on feet: Secondary | ICD-10-CM | POA: Diagnosis not present

## 2023-10-15 DIAGNOSIS — G4733 Obstructive sleep apnea (adult) (pediatric): Secondary | ICD-10-CM | POA: Diagnosis not present

## 2023-10-15 DIAGNOSIS — L03116 Cellulitis of left lower limb: Secondary | ICD-10-CM | POA: Diagnosis not present

## 2023-10-15 DIAGNOSIS — I10 Essential (primary) hypertension: Secondary | ICD-10-CM | POA: Diagnosis not present

## 2023-10-15 DIAGNOSIS — L03115 Cellulitis of right lower limb: Secondary | ICD-10-CM | POA: Diagnosis not present

## 2023-10-15 DIAGNOSIS — S81801D Unspecified open wound, right lower leg, subsequent encounter: Secondary | ICD-10-CM | POA: Diagnosis not present

## 2023-10-15 DIAGNOSIS — R296 Repeated falls: Secondary | ICD-10-CM | POA: Diagnosis not present

## 2023-10-15 DIAGNOSIS — Z7984 Long term (current) use of oral hypoglycemic drugs: Secondary | ICD-10-CM | POA: Diagnosis not present

## 2023-10-15 DIAGNOSIS — I872 Venous insufficiency (chronic) (peripheral): Secondary | ICD-10-CM | POA: Diagnosis not present

## 2023-10-20 DIAGNOSIS — G6289 Other specified polyneuropathies: Secondary | ICD-10-CM | POA: Diagnosis not present

## 2023-10-20 DIAGNOSIS — R296 Repeated falls: Secondary | ICD-10-CM | POA: Diagnosis not present

## 2023-10-20 DIAGNOSIS — R2681 Unsteadiness on feet: Secondary | ICD-10-CM | POA: Diagnosis not present

## 2023-10-20 DIAGNOSIS — R32 Unspecified urinary incontinence: Secondary | ICD-10-CM | POA: Diagnosis not present

## 2023-10-20 DIAGNOSIS — M6281 Muscle weakness (generalized): Secondary | ICD-10-CM | POA: Diagnosis not present

## 2023-10-20 DIAGNOSIS — E1122 Type 2 diabetes mellitus with diabetic chronic kidney disease: Secondary | ICD-10-CM | POA: Diagnosis not present

## 2023-10-20 DIAGNOSIS — N183 Chronic kidney disease, stage 3 unspecified: Secondary | ICD-10-CM | POA: Diagnosis not present

## 2023-10-20 DIAGNOSIS — M810 Age-related osteoporosis without current pathological fracture: Secondary | ICD-10-CM | POA: Diagnosis not present

## 2023-10-20 DIAGNOSIS — G4733 Obstructive sleep apnea (adult) (pediatric): Secondary | ICD-10-CM | POA: Diagnosis not present

## 2023-10-20 DIAGNOSIS — I10 Essential (primary) hypertension: Secondary | ICD-10-CM | POA: Diagnosis not present

## 2023-10-22 DIAGNOSIS — L03115 Cellulitis of right lower limb: Secondary | ICD-10-CM | POA: Diagnosis not present

## 2023-10-22 DIAGNOSIS — S81802D Unspecified open wound, left lower leg, subsequent encounter: Secondary | ICD-10-CM | POA: Diagnosis not present

## 2023-10-22 DIAGNOSIS — E1165 Type 2 diabetes mellitus with hyperglycemia: Secondary | ICD-10-CM | POA: Diagnosis not present

## 2023-10-22 DIAGNOSIS — I872 Venous insufficiency (chronic) (peripheral): Secondary | ICD-10-CM | POA: Diagnosis not present

## 2023-10-22 DIAGNOSIS — S81801D Unspecified open wound, right lower leg, subsequent encounter: Secondary | ICD-10-CM | POA: Diagnosis not present

## 2023-10-22 DIAGNOSIS — L03116 Cellulitis of left lower limb: Secondary | ICD-10-CM | POA: Diagnosis not present

## 2023-10-27 DIAGNOSIS — I872 Venous insufficiency (chronic) (peripheral): Secondary | ICD-10-CM | POA: Diagnosis not present

## 2023-10-27 DIAGNOSIS — E1165 Type 2 diabetes mellitus with hyperglycemia: Secondary | ICD-10-CM | POA: Diagnosis not present

## 2023-10-27 DIAGNOSIS — S81802D Unspecified open wound, left lower leg, subsequent encounter: Secondary | ICD-10-CM | POA: Diagnosis not present

## 2023-10-27 DIAGNOSIS — S81801D Unspecified open wound, right lower leg, subsequent encounter: Secondary | ICD-10-CM | POA: Diagnosis not present

## 2023-10-27 DIAGNOSIS — L03116 Cellulitis of left lower limb: Secondary | ICD-10-CM | POA: Diagnosis not present

## 2023-10-27 DIAGNOSIS — L03115 Cellulitis of right lower limb: Secondary | ICD-10-CM | POA: Diagnosis not present

## 2023-11-02 DIAGNOSIS — L03115 Cellulitis of right lower limb: Secondary | ICD-10-CM | POA: Diagnosis not present

## 2023-11-02 DIAGNOSIS — E1165 Type 2 diabetes mellitus with hyperglycemia: Secondary | ICD-10-CM | POA: Diagnosis not present

## 2023-11-02 DIAGNOSIS — I872 Venous insufficiency (chronic) (peripheral): Secondary | ICD-10-CM | POA: Diagnosis not present

## 2023-11-02 DIAGNOSIS — S81801D Unspecified open wound, right lower leg, subsequent encounter: Secondary | ICD-10-CM | POA: Diagnosis not present

## 2023-11-02 DIAGNOSIS — L03116 Cellulitis of left lower limb: Secondary | ICD-10-CM | POA: Diagnosis not present

## 2023-11-02 DIAGNOSIS — S81802D Unspecified open wound, left lower leg, subsequent encounter: Secondary | ICD-10-CM | POA: Diagnosis not present

## 2023-11-06 DIAGNOSIS — S81801D Unspecified open wound, right lower leg, subsequent encounter: Secondary | ICD-10-CM | POA: Diagnosis not present

## 2023-11-06 DIAGNOSIS — E1122 Type 2 diabetes mellitus with diabetic chronic kidney disease: Secondary | ICD-10-CM | POA: Diagnosis not present

## 2023-11-06 DIAGNOSIS — L03115 Cellulitis of right lower limb: Secondary | ICD-10-CM | POA: Diagnosis not present

## 2023-11-06 DIAGNOSIS — E1165 Type 2 diabetes mellitus with hyperglycemia: Secondary | ICD-10-CM | POA: Diagnosis not present

## 2023-11-06 DIAGNOSIS — S81802D Unspecified open wound, left lower leg, subsequent encounter: Secondary | ICD-10-CM | POA: Diagnosis not present

## 2023-11-06 DIAGNOSIS — L03116 Cellulitis of left lower limb: Secondary | ICD-10-CM | POA: Diagnosis not present

## 2023-11-06 DIAGNOSIS — I872 Venous insufficiency (chronic) (peripheral): Secondary | ICD-10-CM | POA: Diagnosis not present

## 2023-11-10 DIAGNOSIS — I872 Venous insufficiency (chronic) (peripheral): Secondary | ICD-10-CM | POA: Diagnosis not present

## 2023-11-10 DIAGNOSIS — S81802D Unspecified open wound, left lower leg, subsequent encounter: Secondary | ICD-10-CM | POA: Diagnosis not present

## 2023-11-10 DIAGNOSIS — L03116 Cellulitis of left lower limb: Secondary | ICD-10-CM | POA: Diagnosis not present

## 2023-11-10 DIAGNOSIS — L03115 Cellulitis of right lower limb: Secondary | ICD-10-CM | POA: Diagnosis not present

## 2023-11-10 DIAGNOSIS — S81801D Unspecified open wound, right lower leg, subsequent encounter: Secondary | ICD-10-CM | POA: Diagnosis not present

## 2023-11-10 DIAGNOSIS — E1165 Type 2 diabetes mellitus with hyperglycemia: Secondary | ICD-10-CM | POA: Diagnosis not present

## 2023-11-13 DIAGNOSIS — S81802D Unspecified open wound, left lower leg, subsequent encounter: Secondary | ICD-10-CM | POA: Diagnosis not present

## 2023-11-13 DIAGNOSIS — L03115 Cellulitis of right lower limb: Secondary | ICD-10-CM | POA: Diagnosis not present

## 2023-11-13 DIAGNOSIS — S81801D Unspecified open wound, right lower leg, subsequent encounter: Secondary | ICD-10-CM | POA: Diagnosis not present

## 2023-11-13 DIAGNOSIS — E1165 Type 2 diabetes mellitus with hyperglycemia: Secondary | ICD-10-CM | POA: Diagnosis not present

## 2023-11-13 DIAGNOSIS — L03116 Cellulitis of left lower limb: Secondary | ICD-10-CM | POA: Diagnosis not present

## 2023-11-13 DIAGNOSIS — I872 Venous insufficiency (chronic) (peripheral): Secondary | ICD-10-CM | POA: Diagnosis not present

## 2023-11-14 DIAGNOSIS — I10 Essential (primary) hypertension: Secondary | ICD-10-CM | POA: Diagnosis not present

## 2023-11-14 DIAGNOSIS — S81802D Unspecified open wound, left lower leg, subsequent encounter: Secondary | ICD-10-CM | POA: Diagnosis not present

## 2023-11-14 DIAGNOSIS — Z556 Problems related to health literacy: Secondary | ICD-10-CM | POA: Diagnosis not present

## 2023-11-14 DIAGNOSIS — Z794 Long term (current) use of insulin: Secondary | ICD-10-CM | POA: Diagnosis not present

## 2023-11-14 DIAGNOSIS — S81801D Unspecified open wound, right lower leg, subsequent encounter: Secondary | ICD-10-CM | POA: Diagnosis not present

## 2023-11-14 DIAGNOSIS — L03115 Cellulitis of right lower limb: Secondary | ICD-10-CM | POA: Diagnosis not present

## 2023-11-14 DIAGNOSIS — L03116 Cellulitis of left lower limb: Secondary | ICD-10-CM | POA: Diagnosis not present

## 2023-11-14 DIAGNOSIS — R32 Unspecified urinary incontinence: Secondary | ICD-10-CM | POA: Diagnosis not present

## 2023-11-14 DIAGNOSIS — Z7984 Long term (current) use of oral hypoglycemic drugs: Secondary | ICD-10-CM | POA: Diagnosis not present

## 2023-11-14 DIAGNOSIS — E1165 Type 2 diabetes mellitus with hyperglycemia: Secondary | ICD-10-CM | POA: Diagnosis not present

## 2023-11-14 DIAGNOSIS — I872 Venous insufficiency (chronic) (peripheral): Secondary | ICD-10-CM | POA: Diagnosis not present

## 2023-11-16 DIAGNOSIS — E7849 Other hyperlipidemia: Secondary | ICD-10-CM | POA: Diagnosis not present

## 2023-11-16 DIAGNOSIS — E1122 Type 2 diabetes mellitus with diabetic chronic kidney disease: Secondary | ICD-10-CM | POA: Diagnosis not present

## 2023-11-16 DIAGNOSIS — S81801D Unspecified open wound, right lower leg, subsequent encounter: Secondary | ICD-10-CM | POA: Diagnosis not present

## 2023-11-16 DIAGNOSIS — L03115 Cellulitis of right lower limb: Secondary | ICD-10-CM | POA: Diagnosis not present

## 2023-11-16 DIAGNOSIS — K219 Gastro-esophageal reflux disease without esophagitis: Secondary | ICD-10-CM | POA: Diagnosis not present

## 2023-11-16 DIAGNOSIS — E1165 Type 2 diabetes mellitus with hyperglycemia: Secondary | ICD-10-CM | POA: Diagnosis not present

## 2023-11-16 DIAGNOSIS — E039 Hypothyroidism, unspecified: Secondary | ICD-10-CM | POA: Diagnosis not present

## 2023-11-16 DIAGNOSIS — S81802D Unspecified open wound, left lower leg, subsequent encounter: Secondary | ICD-10-CM | POA: Diagnosis not present

## 2023-11-16 DIAGNOSIS — I872 Venous insufficiency (chronic) (peripheral): Secondary | ICD-10-CM | POA: Diagnosis not present

## 2023-11-16 DIAGNOSIS — L03116 Cellulitis of left lower limb: Secondary | ICD-10-CM | POA: Diagnosis not present

## 2023-11-16 DIAGNOSIS — N183 Chronic kidney disease, stage 3 unspecified: Secondary | ICD-10-CM | POA: Diagnosis not present

## 2023-11-17 DIAGNOSIS — L03116 Cellulitis of left lower limb: Secondary | ICD-10-CM | POA: Diagnosis not present

## 2023-11-17 DIAGNOSIS — E1165 Type 2 diabetes mellitus with hyperglycemia: Secondary | ICD-10-CM | POA: Diagnosis not present

## 2023-11-17 DIAGNOSIS — S81801D Unspecified open wound, right lower leg, subsequent encounter: Secondary | ICD-10-CM | POA: Diagnosis not present

## 2023-11-17 DIAGNOSIS — I10 Essential (primary) hypertension: Secondary | ICD-10-CM | POA: Diagnosis not present

## 2023-11-17 DIAGNOSIS — R32 Unspecified urinary incontinence: Secondary | ICD-10-CM | POA: Diagnosis not present

## 2023-11-17 DIAGNOSIS — I872 Venous insufficiency (chronic) (peripheral): Secondary | ICD-10-CM | POA: Diagnosis not present

## 2023-11-17 DIAGNOSIS — L03115 Cellulitis of right lower limb: Secondary | ICD-10-CM | POA: Diagnosis not present

## 2023-11-17 DIAGNOSIS — S81802D Unspecified open wound, left lower leg, subsequent encounter: Secondary | ICD-10-CM | POA: Diagnosis not present

## 2023-11-23 DIAGNOSIS — K7581 Nonalcoholic steatohepatitis (NASH): Secondary | ICD-10-CM | POA: Diagnosis not present

## 2023-11-23 DIAGNOSIS — M810 Age-related osteoporosis without current pathological fracture: Secondary | ICD-10-CM | POA: Diagnosis not present

## 2023-11-23 DIAGNOSIS — E11621 Type 2 diabetes mellitus with foot ulcer: Secondary | ICD-10-CM | POA: Diagnosis not present

## 2023-11-23 DIAGNOSIS — E7849 Other hyperlipidemia: Secondary | ICD-10-CM | POA: Diagnosis not present

## 2023-11-23 DIAGNOSIS — E538 Deficiency of other specified B group vitamins: Secondary | ICD-10-CM | POA: Diagnosis not present

## 2023-11-23 DIAGNOSIS — E1122 Type 2 diabetes mellitus with diabetic chronic kidney disease: Secondary | ICD-10-CM | POA: Diagnosis not present

## 2023-11-23 DIAGNOSIS — I10 Essential (primary) hypertension: Secondary | ICD-10-CM | POA: Diagnosis not present

## 2023-11-23 DIAGNOSIS — E1140 Type 2 diabetes mellitus with diabetic neuropathy, unspecified: Secondary | ICD-10-CM | POA: Diagnosis not present

## 2023-11-23 DIAGNOSIS — R4582 Worries: Secondary | ICD-10-CM | POA: Diagnosis not present

## 2023-11-23 DIAGNOSIS — E559 Vitamin D deficiency, unspecified: Secondary | ICD-10-CM | POA: Diagnosis not present

## 2023-11-23 DIAGNOSIS — R296 Repeated falls: Secondary | ICD-10-CM | POA: Diagnosis not present

## 2023-11-24 DIAGNOSIS — M25561 Pain in right knee: Secondary | ICD-10-CM | POA: Diagnosis not present

## 2023-11-24 DIAGNOSIS — M25562 Pain in left knee: Secondary | ICD-10-CM | POA: Diagnosis not present

## 2023-11-24 DIAGNOSIS — M1712 Unilateral primary osteoarthritis, left knee: Secondary | ICD-10-CM | POA: Diagnosis not present

## 2023-11-26 DIAGNOSIS — I872 Venous insufficiency (chronic) (peripheral): Secondary | ICD-10-CM | POA: Diagnosis not present

## 2023-11-26 DIAGNOSIS — E1165 Type 2 diabetes mellitus with hyperglycemia: Secondary | ICD-10-CM | POA: Diagnosis not present

## 2023-11-26 DIAGNOSIS — L03115 Cellulitis of right lower limb: Secondary | ICD-10-CM | POA: Diagnosis not present

## 2023-11-26 DIAGNOSIS — L03116 Cellulitis of left lower limb: Secondary | ICD-10-CM | POA: Diagnosis not present

## 2023-11-26 DIAGNOSIS — S81802D Unspecified open wound, left lower leg, subsequent encounter: Secondary | ICD-10-CM | POA: Diagnosis not present

## 2023-11-26 DIAGNOSIS — S81801D Unspecified open wound, right lower leg, subsequent encounter: Secondary | ICD-10-CM | POA: Diagnosis not present

## 2023-12-11 ENCOUNTER — Other Ambulatory Visit: Payer: Self-pay

## 2023-12-11 ENCOUNTER — Encounter (HOSPITAL_COMMUNITY): Payer: Self-pay

## 2023-12-11 ENCOUNTER — Emergency Department (HOSPITAL_COMMUNITY)
Admission: EM | Admit: 2023-12-11 | Discharge: 2023-12-11 | Disposition: A | Discharge status: Home / Self Care | Payer: Medicare Other | Attending: Emergency Medicine | Admitting: Emergency Medicine

## 2023-12-11 DIAGNOSIS — Z794 Long term (current) use of insulin: Secondary | ICD-10-CM | POA: Diagnosis not present

## 2023-12-11 DIAGNOSIS — S81812A Laceration without foreign body, left lower leg, initial encounter: Secondary | ICD-10-CM | POA: Insufficient documentation

## 2023-12-11 DIAGNOSIS — Z743 Need for continuous supervision: Secondary | ICD-10-CM | POA: Diagnosis not present

## 2023-12-11 DIAGNOSIS — W19XXXA Unspecified fall, initial encounter: Secondary | ICD-10-CM | POA: Diagnosis not present

## 2023-12-11 DIAGNOSIS — I10 Essential (primary) hypertension: Secondary | ICD-10-CM | POA: Diagnosis not present

## 2023-12-11 DIAGNOSIS — W06XXXA Fall from bed, initial encounter: Secondary | ICD-10-CM | POA: Insufficient documentation

## 2023-12-11 LAB — CBG MONITORING, ED: Glucose-Capillary: 114 mg/dL — ABNORMAL HIGH (ref 70–99)

## 2023-12-11 MED ORDER — BACITRACIN ZINC 500 UNIT/GM EX OINT
TOPICAL_OINTMENT | CUTANEOUS | Status: AC
  Administered 2023-12-11: 1
  Filled 2023-12-11: qty 0.9

## 2023-12-11 NOTE — Discharge Instructions (Signed)
 You had a skin tear of your left leg after a fall.  Please use gentle soap and water and watch this area for any signs of infection.

## 2023-12-11 NOTE — ED Provider Notes (Signed)
 [place] EMERGENCY DEPARTMENT AT [facility] Provider Note   CSN: [id number] Arrival date & time: 12/11/23  9187     History  Chief Complaint  Patient presents with   Skin Tear    Kathryn Ryan is a 83 y.o. female.  She is brought in by ambulance for evaluation of injuries from a fall.  EMS states she slid out of her wheelchair, patient states she fell out of bed.  She has some skin tears to her left lower leg.  She denies any other injuries or complaints.  She is not on any blood thinners and denies striking her head.  No neck or back pain no chest pain or abdominal pain.  She said her tetanus shot was less than 5 years ago.  The history is provided by the patient and the EMS personnel.  Fall This is a new problem. The problem has not changed since onset.Pertinent negatives include no chest pain, no abdominal pain, no headaches and no shortness of breath. Nothing aggravates the symptoms. Nothing relieves the symptoms. She has tried nothing for the symptoms. The treatment provided no relief.       Home Medications Prior to Admission medications   Medication Sig Start Date End Date Taking? Authorizing Provider  atorvastatin  (LIPITOR) 10 MG tablet Take 10 mg by mouth every morning.    [provider]  cholecalciferol (VITAMIN D3) 25 MCG (1000 UNIT) tablet Take 2,000 Units by mouth every morning.    [provider]  Cranberry 450 MG CAPS Take 450 mg by mouth every morning.    [provider]  DULoxetine  (CYMBALTA ) 60 MG capsule Take 60 mg by mouth 2 (two) times daily.    [provider]  Exenatide ER (BYDUREON) 2 MG PEN Inject 2 mg into the skin every Thursday.    [provider]  HYDROcodone -acetaminophen  (VICODIN) 5-325 mg TABS tablet Take 1 tablet by mouth at bedtime.    [provider]  insulin  glargine (LANTUS ) 100 UNIT/ML injection Inject 120 Units into the skin at bedtime.    [provider]   levothyroxine  (SYNTHROID , LEVOTHROID) 150 MCG tablet Take 150 mcg by mouth daily before breakfast.    [provider]  Melatonin 12 MG TABS Take 12 mg by mouth at bedtime.    [provider]  metFORMIN (GLUCOPHAGE-XR) 500 MG 24 hr tablet Take 500 mg by mouth at bedtime. 06/03/16 03/07/23  [provider]  omeprazole (PRILOSEC) 20 MG capsule Take 20 mg by mouth every morning.    [provider]  pregabalin  (LYRICA ) 200 MG capsule Take 200 mg by mouth 2 (two) times daily.    [provider]  traZODone (DESYREL) 100 MG tablet Take 100 mg by mouth at bedtime.    [provider]      Allergies    Statins and Sulfa antibiotics    Review of Systems   Review of Systems  Constitutional:  Negative for fever.  Respiratory:  Negative for shortness of breath.   Cardiovascular:  Negative for chest pain.  Gastrointestinal:  Negative for abdominal pain.  Skin:  Positive for wound.  Neurological:  Negative for headaches.    Physical Exam Updated Vital Signs Temp 97.6 F (36.4 C) (Oral)   Ht 5' 6 (1.676 m)   Wt 109.8 kg   BMI 39.07 kg/m  Physical Exam Vitals and nursing note reviewed.  Constitutional:      General: She is not in acute distress.  Appearance: Normal appearance. She is well-developed.  HENT:     Head: Normocephalic and atraumatic.  Eyes:     Conjunctiva/sclera: Conjunctivae normal.  Cardiovascular:     Rate and Rhythm: Normal rate and regular rhythm.     Heart sounds: No murmur heard. Pulmonary:     Effort: Pulmonary effort is normal. No respiratory distress.     Breath sounds: Normal breath sounds.  Abdominal:     Palpations: Abdomen is soft.     Tenderness: There is no abdominal tenderness.  Musculoskeletal:        General: Tenderness and signs of injury present.     Cervical back: Neck supple.     Comments: She has some skin tears of her left lower leg.  There is no particular bony tenderness and no pain with  axial loading.  Skin:    General: Skin is warm and dry.     Capillary Refill: Capillary refill takes less than 2 seconds.  Neurological:     General: No focal deficit present.     Mental Status: She is alert.     Sensory: No sensory deficit.     Motor: No weakness.     ED Results / Procedures / Treatments   Labs (all labs ordered are listed, but only abnormal results are displayed) Labs Reviewed  CBG MONITORING, ED - Abnormal; Notable for the following components:      Result Value   Glucose-Capillary 114 (*)    All other components within normal limits    EKG None  Radiology No results found.  Procedures Procedures    Medications Ordered in ED Medications - No data to display  ED Course/ Medical Decision Making/ A&P Clinical Course as of 12/11/23 0835  Fri Dec 11, 2023  0830 We clean the area and tried to lay down some of the skin to cover her wound.  She was getting some bacitracin  and a nonstick dressing.  Ambulance has been called to return her back to her facility. [MB]    Clinical Course User Index [MB] Towana Ozell BROCKS, MD                                 Medical Decision Making  This patient complains of fall left leg injury; this involves an extensive number of treatment Options and is a complaint that carries with it a high risk of complications and morbidity. The differential includes fracture, contusion, laceration, skin tear  Additional history obtained from EMS Previous records obtained and reviewed in epic, not on any blood thinners Social determinants considered, no significant barriers Critical Interventions: None  After the interventions stated above, I reevaluated the patient and found awake and neuro intact Admission and further testing considered, no indication for further workup at this time.  Will return patient back to her facility.  Return instructions discussed         Final Clinical Impression(s) / ED Diagnoses Final  diagnoses:  Fall, initial encounter  Noninfected skin tear of left lower extremity, initial encounter    Rx / DC Orders ED Discharge Orders     None         Towana Ozell BROCKS, MD 12/11/23 [phone]

## 2023-12-11 NOTE — ED Triage Notes (Signed)
 Pt BIB ems for skin tear to LLE after sliding out of her wheel chair. Pt is not on blood thinners and not having pain.

## 2023-12-25 DIAGNOSIS — E1122 Type 2 diabetes mellitus with diabetic chronic kidney disease: Secondary | ICD-10-CM | POA: Diagnosis not present

## 2023-12-25 DIAGNOSIS — K219 Gastro-esophageal reflux disease without esophagitis: Secondary | ICD-10-CM | POA: Diagnosis not present

## 2023-12-25 DIAGNOSIS — E782 Mixed hyperlipidemia: Secondary | ICD-10-CM | POA: Diagnosis not present

## 2023-12-25 DIAGNOSIS — G4733 Obstructive sleep apnea (adult) (pediatric): Secondary | ICD-10-CM | POA: Diagnosis not present

## 2023-12-25 DIAGNOSIS — Z9181 History of falling: Secondary | ICD-10-CM | POA: Diagnosis not present

## 2023-12-25 DIAGNOSIS — U071 COVID-19: Secondary | ICD-10-CM | POA: Diagnosis not present

## 2023-12-25 DIAGNOSIS — M1712 Unilateral primary osteoarthritis, left knee: Secondary | ICD-10-CM | POA: Diagnosis not present

## 2023-12-25 DIAGNOSIS — G9341 Metabolic encephalopathy: Secondary | ICD-10-CM | POA: Diagnosis not present

## 2023-12-25 DIAGNOSIS — Z466 Encounter for fitting and adjustment of urinary device: Secondary | ICD-10-CM | POA: Diagnosis not present

## 2023-12-25 DIAGNOSIS — N183 Chronic kidney disease, stage 3 unspecified: Secondary | ICD-10-CM | POA: Diagnosis not present

## 2023-12-25 DIAGNOSIS — E1142 Type 2 diabetes mellitus with diabetic polyneuropathy: Secondary | ICD-10-CM | POA: Diagnosis not present

## 2023-12-25 DIAGNOSIS — I129 Hypertensive chronic kidney disease with stage 1 through stage 4 chronic kidney disease, or unspecified chronic kidney disease: Secondary | ICD-10-CM | POA: Diagnosis not present

## 2023-12-25 DIAGNOSIS — M19012 Primary osteoarthritis, left shoulder: Secondary | ICD-10-CM | POA: Diagnosis not present

## 2023-12-25 DIAGNOSIS — E538 Deficiency of other specified B group vitamins: Secondary | ICD-10-CM | POA: Diagnosis not present

## 2023-12-25 DIAGNOSIS — N390 Urinary tract infection, site not specified: Secondary | ICD-10-CM | POA: Diagnosis not present

## 2023-12-25 DIAGNOSIS — Z794 Long term (current) use of insulin: Secondary | ICD-10-CM | POA: Diagnosis not present

## 2023-12-25 DIAGNOSIS — M810 Age-related osteoporosis without current pathological fracture: Secondary | ICD-10-CM | POA: Diagnosis not present

## 2023-12-25 DIAGNOSIS — Z6831 Body mass index (BMI) 31.0-31.9, adult: Secondary | ICD-10-CM | POA: Diagnosis not present

## 2023-12-25 DIAGNOSIS — E039 Hypothyroidism, unspecified: Secondary | ICD-10-CM | POA: Diagnosis not present

## 2023-12-25 DIAGNOSIS — K7581 Nonalcoholic steatohepatitis (NASH): Secondary | ICD-10-CM | POA: Diagnosis not present

## 2023-12-25 DIAGNOSIS — R32 Unspecified urinary incontinence: Secondary | ICD-10-CM | POA: Diagnosis not present

## 2023-12-25 DIAGNOSIS — Z7984 Long term (current) use of oral hypoglycemic drugs: Secondary | ICD-10-CM | POA: Diagnosis not present

## 2023-12-28 ENCOUNTER — Encounter (HOSPITAL_COMMUNITY): Payer: Self-pay | Admitting: Emergency Medicine

## 2023-12-28 ENCOUNTER — Other Ambulatory Visit: Payer: Self-pay

## 2023-12-28 ENCOUNTER — Emergency Department (HOSPITAL_COMMUNITY)
Admission: EM | Admit: 2023-12-28 | Discharge: 2023-12-28 | Disposition: A | Discharge status: Home / Self Care | Payer: Medicare Other | Attending: Emergency Medicine | Admitting: Emergency Medicine

## 2023-12-28 DIAGNOSIS — N35919 Unspecified urethral stricture, male, unspecified site: Secondary | ICD-10-CM | POA: Diagnosis not present

## 2023-12-28 DIAGNOSIS — E1142 Type 2 diabetes mellitus with diabetic polyneuropathy: Secondary | ICD-10-CM | POA: Diagnosis not present

## 2023-12-28 DIAGNOSIS — N368 Other specified disorders of urethra: Secondary | ICD-10-CM | POA: Diagnosis not present

## 2023-12-28 DIAGNOSIS — Z466 Encounter for fitting and adjustment of urinary device: Secondary | ICD-10-CM | POA: Diagnosis not present

## 2023-12-28 DIAGNOSIS — R32 Unspecified urinary incontinence: Secondary | ICD-10-CM | POA: Diagnosis not present

## 2023-12-28 DIAGNOSIS — I129 Hypertensive chronic kidney disease with stage 1 through stage 4 chronic kidney disease, or unspecified chronic kidney disease: Secondary | ICD-10-CM | POA: Diagnosis not present

## 2023-12-28 DIAGNOSIS — E1122 Type 2 diabetes mellitus with diabetic chronic kidney disease: Secondary | ICD-10-CM | POA: Diagnosis not present

## 2023-12-28 DIAGNOSIS — I10 Essential (primary) hypertension: Secondary | ICD-10-CM | POA: Diagnosis not present

## 2023-12-28 DIAGNOSIS — R3989 Other symptoms and signs involving the genitourinary system: Secondary | ICD-10-CM | POA: Diagnosis present

## 2023-12-28 DIAGNOSIS — N183 Chronic kidney disease, stage 3 unspecified: Secondary | ICD-10-CM | POA: Diagnosis not present

## 2023-12-28 MED ORDER — MIRABEGRON ER 25 MG PO TB24: 25.0000 mg | ORAL_TABLET | Freq: Once | ORAL | Status: DC

## 2023-12-28 NOTE — ED Provider Notes (Signed)
[name] EMERGENCY DEPARTMENT AT [facility] Provider Note   CSN: [id number] Arrival date & time: 12/28/23  1315     History  Chief Complaint  Patient presents with   foley catheter pain    Kathryn Ryan is a 83 y.o. female.  HPI 83 year old female with a history of chronic incontinence and bedwetting presents with pain around her Foley catheter site.  She states that 3 days ago her facility put in a Foley catheter because she was having such chronic and embarrassing bedwetting.  The patient states that she had a lot of pain the night they put in the catheter at the insertion site in her urethra.  Over the past 2 days the pain had essentially dissipated but came back today.  Seems to spasm on her.  She otherwise denies fevers, abdominal pain, back/flank pain, etc.  She states she used to be on something for bedwetting that seemed to help but then eventually stopped helping and she longer takes it.  She does not remember what this was.  Home Medications Prior to Admission medications   Medication Sig Start Date End Date Taking? Authorizing Provider  atorvastatin (LIPITOR) 10 MG tablet Take 10 mg by mouth every morning.   Yes [provider]  BYDUREON BCISE 2 MG/0.85ML AUIJ Inject 2 mg into the skin once a week. 12/15/23  Yes [provider]  cholecalciferol (VITAMIN D3) 25 MCG (1000 UNIT) tablet Take 2,000 Units by mouth every morning.   Yes [provider]  Cranberry 450 MG CAPS Take 450 mg by mouth every morning.   Yes [provider]  DULoxetine (CYMBALTA) 60 MG capsule Take 60 mg by mouth 2 (two) times daily.   Yes [provider]  HYDROcodone-acetaminophen (VICODIN) 5-325 mg TABS tablet Take 1 tablet by mouth at bedtime.   Yes [provider]  LANTUS SOLOSTAR 100 UNIT/ML Solostar Pen Inject 120 Units into the skin at bedtime. 12/22/23  Yes [provider]  levothyroxine (SYNTHROID, LEVOTHROID) 150 MCG tablet  Take 150 mcg by mouth daily before breakfast.   Yes [provider]  Melatonin 12 MG TABS Take 12 mg by mouth at bedtime.   Yes [provider]  metFORMIN (GLUCOPHAGE-XR) 500 MG 24 hr tablet Take 500 mg by mouth daily. 06/03/16 12/28/23 Yes [provider]  MYRBETRIQ 50 MG TB24 tablet Take 50 mg by mouth daily. 11/25/23  Yes [provider]  omeprazole (PRILOSEC) 20 MG capsule Take 20 mg by mouth every morning.   Yes [provider]  traZODone (DESYREL) 100 MG tablet Take 100 mg by mouth at bedtime.   Yes [provider]  pregabalin (LYRICA) 200 MG capsule Take 200 mg by mouth 2 (two) times daily. Patient not taking: Reported on 12/28/2023    [provider]      Allergies    Statins and Sulfa antibiotics    Review of Systems   Review of Systems  Constitutional:  Negative for fever.  Gastrointestinal:  Negative for abdominal pain.  Genitourinary:  Negative for flank pain.  Musculoskeletal:  Negative for back pain.    Physical Exam Updated Vital Signs BP (!) 143/89   Pulse 93   Temp 98.2 F (36.8 C) (Oral)   Resp 20   Ht 5\' 6"  (1.676 m)   Wt 109 kg   SpO2 95%   BMI 38.79 kg/m  Physical Exam Vitals and nursing note reviewed. Exam conducted with a chaperone present.  Constitutional:  Appearance: She is well-developed.  HENT:     Head: Normocephalic and atraumatic.  Pulmonary:     Effort: Pulmonary effort is normal.  Abdominal:     General: There is no distension.     Palpations: Abdomen is soft.     Tenderness: There is no abdominal tenderness.  Genitourinary:    Comments: Foley catheter is in place and draining normal appearing urine. There is no obvious vulvar/labial lesions or irritation. Skin:    General: Skin is warm and dry.  Neurological:     Mental Status: She is alert.     ED Results / Procedures / Treatments   Labs (all labs ordered are listed, but only abnormal results are displayed) Labs  Reviewed - No data to display  EKG None  Radiology No results found.  Procedures Procedures    Medications Ordered in ED Medications - No data to display  ED Course/ Medical Decision Making/ A&P                                 Medical Decision Making  I discussed that putting a Foley catheter is not typical care for her presentation.  However she does not want me to take it out.  Considered antispasm meds but it appears she is already on 50 mg of Myrbetriq.  Due to this, I think she will need outpatient follow-up with urology.  This seems to be pain at the site of the catheter rather than a clinical UTI and so I do not think urine testing will be helpful as it is likely to be contaminated.  Her vitals are unremarkable.  She will be discharged back to her facility with outpatient urology follow-up.        Final Clinical Impression(s) / ED Diagnoses Final diagnoses:  Spasm of urethra    Rx / DC Orders ED Discharge Orders     None         Pricilla Loveless, MD 12/28/23 1537

## 2023-12-28 NOTE — ED Notes (Signed)
Staff states they gave her a catheter due to urinary incontinence

## 2023-12-28 NOTE — ED Triage Notes (Signed)
Pt had foley catheter placed Friday. States last night the pain became unbearable. Pt states they placed the catheter because she is a bed wetter and has a knee surgery at end of March.

## 2023-12-28 NOTE — Discharge Instructions (Signed)
Follow-up with urology in regards to your catheter pain.  Continue to take your meds including your Myrbetriq which helps with spasming of your genitourinary system.  If you develop fever, abdominal pain, or any other new/concerning symptoms then return to the ER or call 911.

## 2023-12-28 NOTE — ED Notes (Signed)
[facility] called to transport patient back to the [facility] [place].

## 2024-01-01 DIAGNOSIS — M170 Bilateral primary osteoarthritis of knee: Secondary | ICD-10-CM | POA: Diagnosis not present

## 2024-01-02 DIAGNOSIS — N183 Chronic kidney disease, stage 3 unspecified: Secondary | ICD-10-CM | POA: Diagnosis not present

## 2024-01-02 DIAGNOSIS — R32 Unspecified urinary incontinence: Secondary | ICD-10-CM | POA: Diagnosis not present

## 2024-01-02 DIAGNOSIS — E1142 Type 2 diabetes mellitus with diabetic polyneuropathy: Secondary | ICD-10-CM | POA: Diagnosis not present

## 2024-01-02 DIAGNOSIS — E1122 Type 2 diabetes mellitus with diabetic chronic kidney disease: Secondary | ICD-10-CM | POA: Diagnosis not present

## 2024-01-02 DIAGNOSIS — I129 Hypertensive chronic kidney disease with stage 1 through stage 4 chronic kidney disease, or unspecified chronic kidney disease: Secondary | ICD-10-CM | POA: Diagnosis not present

## 2024-01-02 DIAGNOSIS — Z466 Encounter for fitting and adjustment of urinary device: Secondary | ICD-10-CM | POA: Diagnosis not present

## 2024-01-04 DIAGNOSIS — L851 Acquired keratosis [keratoderma] palmaris et plantaris: Secondary | ICD-10-CM | POA: Diagnosis not present

## 2024-01-04 DIAGNOSIS — B351 Tinea unguium: Secondary | ICD-10-CM | POA: Diagnosis not present

## 2024-01-04 DIAGNOSIS — E1142 Type 2 diabetes mellitus with diabetic polyneuropathy: Secondary | ICD-10-CM | POA: Diagnosis not present

## 2024-01-04 DIAGNOSIS — M79675 Pain in left toe(s): Secondary | ICD-10-CM | POA: Diagnosis not present

## 2024-01-06 DIAGNOSIS — I129 Hypertensive chronic kidney disease with stage 1 through stage 4 chronic kidney disease, or unspecified chronic kidney disease: Secondary | ICD-10-CM | POA: Diagnosis not present

## 2024-01-06 DIAGNOSIS — Z466 Encounter for fitting and adjustment of urinary device: Secondary | ICD-10-CM | POA: Diagnosis not present

## 2024-01-06 DIAGNOSIS — E1122 Type 2 diabetes mellitus with diabetic chronic kidney disease: Secondary | ICD-10-CM | POA: Diagnosis not present

## 2024-01-06 DIAGNOSIS — R32 Unspecified urinary incontinence: Secondary | ICD-10-CM | POA: Diagnosis not present

## 2024-01-06 DIAGNOSIS — N183 Chronic kidney disease, stage 3 unspecified: Secondary | ICD-10-CM | POA: Diagnosis not present

## 2024-01-06 DIAGNOSIS — E1142 Type 2 diabetes mellitus with diabetic polyneuropathy: Secondary | ICD-10-CM | POA: Diagnosis not present

## 2024-01-07 DIAGNOSIS — Z466 Encounter for fitting and adjustment of urinary device: Secondary | ICD-10-CM | POA: Diagnosis not present

## 2024-01-07 DIAGNOSIS — G9341 Metabolic encephalopathy: Secondary | ICD-10-CM | POA: Diagnosis not present

## 2024-01-07 DIAGNOSIS — N390 Urinary tract infection, site not specified: Secondary | ICD-10-CM | POA: Diagnosis not present

## 2024-01-07 DIAGNOSIS — E1122 Type 2 diabetes mellitus with diabetic chronic kidney disease: Secondary | ICD-10-CM | POA: Diagnosis not present

## 2024-01-07 DIAGNOSIS — M19012 Primary osteoarthritis, left shoulder: Secondary | ICD-10-CM | POA: Diagnosis not present

## 2024-01-07 DIAGNOSIS — I129 Hypertensive chronic kidney disease with stage 1 through stage 4 chronic kidney disease, or unspecified chronic kidney disease: Secondary | ICD-10-CM | POA: Diagnosis not present

## 2024-01-07 DIAGNOSIS — U071 COVID-19: Secondary | ICD-10-CM | POA: Diagnosis not present

## 2024-01-07 DIAGNOSIS — N183 Chronic kidney disease, stage 3 unspecified: Secondary | ICD-10-CM | POA: Diagnosis not present

## 2024-01-07 DIAGNOSIS — E1142 Type 2 diabetes mellitus with diabetic polyneuropathy: Secondary | ICD-10-CM | POA: Diagnosis not present

## 2024-01-07 DIAGNOSIS — R32 Unspecified urinary incontinence: Secondary | ICD-10-CM | POA: Diagnosis not present

## 2024-01-07 DIAGNOSIS — M1712 Unilateral primary osteoarthritis, left knee: Secondary | ICD-10-CM | POA: Diagnosis not present

## 2024-01-08 DIAGNOSIS — R3915 Urgency of urination: Secondary | ICD-10-CM | POA: Diagnosis not present

## 2024-01-08 DIAGNOSIS — R339 Retention of urine, unspecified: Secondary | ICD-10-CM | POA: Diagnosis not present

## 2024-01-08 DIAGNOSIS — R32 Unspecified urinary incontinence: Secondary | ICD-10-CM | POA: Diagnosis not present

## 2024-01-11 DIAGNOSIS — R32 Unspecified urinary incontinence: Secondary | ICD-10-CM | POA: Diagnosis not present

## 2024-01-11 DIAGNOSIS — I129 Hypertensive chronic kidney disease with stage 1 through stage 4 chronic kidney disease, or unspecified chronic kidney disease: Secondary | ICD-10-CM | POA: Diagnosis not present

## 2024-01-11 DIAGNOSIS — N183 Chronic kidney disease, stage 3 unspecified: Secondary | ICD-10-CM | POA: Diagnosis not present

## 2024-01-11 DIAGNOSIS — Z466 Encounter for fitting and adjustment of urinary device: Secondary | ICD-10-CM | POA: Diagnosis not present

## 2024-01-11 DIAGNOSIS — E1142 Type 2 diabetes mellitus with diabetic polyneuropathy: Secondary | ICD-10-CM | POA: Diagnosis not present

## 2024-01-11 DIAGNOSIS — E1122 Type 2 diabetes mellitus with diabetic chronic kidney disease: Secondary | ICD-10-CM | POA: Diagnosis not present

## 2024-01-24 DIAGNOSIS — R32 Unspecified urinary incontinence: Secondary | ICD-10-CM | POA: Diagnosis not present

## 2024-01-24 DIAGNOSIS — N390 Urinary tract infection, site not specified: Secondary | ICD-10-CM | POA: Diagnosis not present

## 2024-01-24 DIAGNOSIS — E1142 Type 2 diabetes mellitus with diabetic polyneuropathy: Secondary | ICD-10-CM | POA: Diagnosis not present

## 2024-01-24 DIAGNOSIS — G4733 Obstructive sleep apnea (adult) (pediatric): Secondary | ICD-10-CM | POA: Diagnosis not present

## 2024-01-24 DIAGNOSIS — M1712 Unilateral primary osteoarthritis, left knee: Secondary | ICD-10-CM | POA: Diagnosis not present

## 2024-01-24 DIAGNOSIS — E782 Mixed hyperlipidemia: Secondary | ICD-10-CM | POA: Diagnosis not present

## 2024-01-24 DIAGNOSIS — E1122 Type 2 diabetes mellitus with diabetic chronic kidney disease: Secondary | ICD-10-CM | POA: Diagnosis not present

## 2024-01-24 DIAGNOSIS — Z7984 Long term (current) use of oral hypoglycemic drugs: Secondary | ICD-10-CM | POA: Diagnosis not present

## 2024-01-24 DIAGNOSIS — E039 Hypothyroidism, unspecified: Secondary | ICD-10-CM | POA: Diagnosis not present

## 2024-01-24 DIAGNOSIS — K7581 Nonalcoholic steatohepatitis (NASH): Secondary | ICD-10-CM | POA: Diagnosis not present

## 2024-01-24 DIAGNOSIS — I129 Hypertensive chronic kidney disease with stage 1 through stage 4 chronic kidney disease, or unspecified chronic kidney disease: Secondary | ICD-10-CM | POA: Diagnosis not present

## 2024-01-24 DIAGNOSIS — G9341 Metabolic encephalopathy: Secondary | ICD-10-CM | POA: Diagnosis not present

## 2024-01-24 DIAGNOSIS — U071 COVID-19: Secondary | ICD-10-CM | POA: Diagnosis not present

## 2024-01-24 DIAGNOSIS — M19012 Primary osteoarthritis, left shoulder: Secondary | ICD-10-CM | POA: Diagnosis not present

## 2024-01-24 DIAGNOSIS — K219 Gastro-esophageal reflux disease without esophagitis: Secondary | ICD-10-CM | POA: Diagnosis not present

## 2024-01-24 DIAGNOSIS — Z794 Long term (current) use of insulin: Secondary | ICD-10-CM | POA: Diagnosis not present

## 2024-01-24 DIAGNOSIS — Z6831 Body mass index (BMI) 31.0-31.9, adult: Secondary | ICD-10-CM | POA: Diagnosis not present

## 2024-01-24 DIAGNOSIS — E538 Deficiency of other specified B group vitamins: Secondary | ICD-10-CM | POA: Diagnosis not present

## 2024-01-24 DIAGNOSIS — M810 Age-related osteoporosis without current pathological fracture: Secondary | ICD-10-CM | POA: Diagnosis not present

## 2024-01-24 DIAGNOSIS — Z466 Encounter for fitting and adjustment of urinary device: Secondary | ICD-10-CM | POA: Diagnosis not present

## 2024-01-24 DIAGNOSIS — Z9181 History of falling: Secondary | ICD-10-CM | POA: Diagnosis not present

## 2024-01-24 DIAGNOSIS — N183 Chronic kidney disease, stage 3 unspecified: Secondary | ICD-10-CM | POA: Diagnosis not present

## 2024-01-27 DIAGNOSIS — R32 Unspecified urinary incontinence: Secondary | ICD-10-CM | POA: Diagnosis not present

## 2024-01-27 DIAGNOSIS — Z466 Encounter for fitting and adjustment of urinary device: Secondary | ICD-10-CM | POA: Diagnosis not present

## 2024-01-27 DIAGNOSIS — E1142 Type 2 diabetes mellitus with diabetic polyneuropathy: Secondary | ICD-10-CM | POA: Diagnosis not present

## 2024-01-27 DIAGNOSIS — I129 Hypertensive chronic kidney disease with stage 1 through stage 4 chronic kidney disease, or unspecified chronic kidney disease: Secondary | ICD-10-CM | POA: Diagnosis not present

## 2024-01-27 DIAGNOSIS — E1122 Type 2 diabetes mellitus with diabetic chronic kidney disease: Secondary | ICD-10-CM | POA: Diagnosis not present

## 2024-01-27 DIAGNOSIS — N183 Chronic kidney disease, stage 3 unspecified: Secondary | ICD-10-CM | POA: Diagnosis not present

## 2024-01-29 DIAGNOSIS — Z466 Encounter for fitting and adjustment of urinary device: Secondary | ICD-10-CM | POA: Diagnosis not present

## 2024-01-29 DIAGNOSIS — E1122 Type 2 diabetes mellitus with diabetic chronic kidney disease: Secondary | ICD-10-CM | POA: Diagnosis not present

## 2024-01-29 DIAGNOSIS — N183 Chronic kidney disease, stage 3 unspecified: Secondary | ICD-10-CM | POA: Diagnosis not present

## 2024-01-29 DIAGNOSIS — E1142 Type 2 diabetes mellitus with diabetic polyneuropathy: Secondary | ICD-10-CM | POA: Diagnosis not present

## 2024-01-29 DIAGNOSIS — R32 Unspecified urinary incontinence: Secondary | ICD-10-CM | POA: Diagnosis not present

## 2024-01-29 DIAGNOSIS — I129 Hypertensive chronic kidney disease with stage 1 through stage 4 chronic kidney disease, or unspecified chronic kidney disease: Secondary | ICD-10-CM | POA: Diagnosis not present

## 2024-02-05 DIAGNOSIS — R3915 Urgency of urination: Secondary | ICD-10-CM | POA: Diagnosis not present

## 2024-02-05 DIAGNOSIS — R32 Unspecified urinary incontinence: Secondary | ICD-10-CM | POA: Diagnosis not present

## 2024-02-09 DIAGNOSIS — M25562 Pain in left knee: Secondary | ICD-10-CM | POA: Diagnosis not present

## 2024-02-09 NOTE — H&P (Signed)
 TOTAL KNEE ADMISSION H&P  Patient is being admitted for left total knee arthroplasty.  Subjective:  Chief Complaint: Left knee pain.  HPI: Kathryn Ryan, 83 y.o. female has a history of pain and functional disability in the left knee due to arthritis and has failed non-surgical conservative treatments for greater than 12 weeks to include NSAID's and/or analgesics, corticosteriod injections, and activity modification. Onset of symptoms was gradual, starting >10 years ago with gradually worsening course since that time. The patient noted no past surgery on the left knee.  Patient currently rates pain in the left knee at 8 out of 10 with activity. Patient has night pain, worsening of pain with activity and weight bearing, pain with passive range of motion, and crepitus. Patient has evidence of  bone-on-bone arthritis lateral and patellofemoral in the left knee, and bone-on-bone medial patellofemoral in the right knee  by imaging studies. There is no active infection.  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   Left renal mass 11/22/2020   Left adrenal mass (HCC) 11/22/2020   Chronic pain of right knee 02/23/2019   Unilateral primary osteoarthritis, right knee 02/23/2019   Diabetes (HCC) 08/04/2017   Mixed hyperlipidemia 06/15/2014   Cellulitis of left ankle 04/25/2014   Septic arthritis (HCC) 04/25/2014   Acute osteomyelitis (HCC) 04/25/2014   Hyperglycemia 04/25/2014   HTN (hypertension) 04/25/2014   Infected hardware in left leg (HCC) 04/25/2014   Anemia 10/20/2013   GERD (gastroesophageal reflux disease) 10/19/2013   Chronic kidney disease, unspecified 12/09/1999   Obstructive sleep apnea 12/09/1999   Morbid obesity due to excess calories (HCC) 12/09/1999   Hypothyroidism 12/09/1999   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   Dysmetabolic syndrome X 12/09/1999    Past Medical History:   Diagnosis Date   Anxiety    Diabetes (HCC) 08/04/2017   Diabetes mellitus without complication (HCC)    new onset diabetic    GERD (gastroesophageal reflux disease)    H/O hiatal hernia    Hypertension    Hypothyroidism    Lumbar radiculopathy    Neuropathy    non related to DM-not sure why   Neuropathy    non DM related   Spinal stenosis of lumbar region     Past Surgical History:  Procedure Laterality Date   ABDOMINAL HYSTERECTOMY     BACK SURGERY     BREAST SURGERY     bilateral lumpectomies   BUNIONECTOMY     bilateral   CHOLECYSTECTOMY     HARDWARE REMOVAL Left 04/26/2014   Procedure: HARDWARE REMOVAL LEFT ANKLE;  Surgeon: Toni Arthurs, MD;  Location: [facility] OR;  Service: Orthopedics;  Laterality: Left;   I & D EXTREMITY Left 04/26/2014   Procedure: IRRIGATION AND DEBRIDEMENT LEFT ANKLE;  Surgeon: Toni Arthurs, MD;  Location: [facility] OR;  Service: Orthopedics;  Laterality: Left;   IRRIGATION AND DEBRIDEMENT ABSCESS Left 04/26/2014   LEFT OOPHORECTOMY     PERIPHERALLY INSERTED CENTRAL CATHETER INSERTION  04/26/2014   rt upper arm   TONSILLECTOMY     6 yrs    Prior to Admission medications   Medication Sig Start Date End Date Taking? Authorizing Provider  atorvastatin (LIPITOR) 10 MG tablet Take 10 mg by mouth every morning.    [provider]  BYDUREON BCISE 2 MG/0.85ML AUIJ Inject 2 mg into the skin once a week. 12/15/23   [provider]  cholecalciferol (VITAMIN D3) 25  MCG (1000 UNIT) tablet Take 2,000 Units by mouth every morning.    [provider]  Cranberry 450 MG CAPS Take 450 mg by mouth every morning.    [provider]  DULoxetine (CYMBALTA) 60 MG capsule Take 60 mg by mouth 2 (two) times daily.    [provider]  HYDROcodone-acetaminophen (VICODIN) 5-325 mg TABS tablet Take 1 tablet by mouth at bedtime.    [provider]  LANTUS SOLOSTAR 100 UNIT/ML Solostar Pen Inject 120 Units into the skin at bedtime. 12/22/23    [provider]  levothyroxine (SYNTHROID, LEVOTHROID) 150 MCG tablet Take 150 mcg by mouth daily before breakfast.    [provider]  Melatonin 12 MG TABS Take 12 mg by mouth at bedtime.    [provider]  metFORMIN (GLUCOPHAGE-XR) 500 MG 24 hr tablet Take 500 mg by mouth daily. 06/03/16 12/28/23  [provider]  MYRBETRIQ 50 MG TB24 tablet Take 50 mg by mouth daily. 11/25/23   [provider]  omeprazole (PRILOSEC) 20 MG capsule Take 20 mg by mouth every morning.    [provider]  pregabalin (LYRICA) 200 MG capsule Take 200 mg by mouth 2 (two) times daily. Patient not taking: Reported on 12/28/2023    [provider]  traZODone (DESYREL) 100 MG tablet Take 100 mg by mouth at bedtime.    [provider]    Allergies  Allergen Reactions   Statins Other (See Comments)    Joint pain    Sulfa Antibiotics Itching    Social History   Socioeconomic History   Marital status: Widowed    Spouse name: Not on file   Number of children: Not on file   Years of education: Not on file   Highest education level: Not on file  Occupational History   Not on file  Tobacco Use   Smoking status: Former    Current packs/day: 0.00    Average packs/day: 2.0 packs/day for 26.2 years (52.4 ttl pk-yrs)    Types: Cigarettes    Start date: 12/17/1968    Quit date: 02/23/1995    Years since quitting: 28.9   Smokeless tobacco: Never  Vaping Use   Vaping status: Never Used  Substance and Sexual Activity   Alcohol use: No   Drug use: No   Sexual activity: Not Currently  Other Topics Concern   Not on file  Social History Narrative   Not on file   Social Drivers of Health   Financial Resource Strain: Low Risk  (11/05/2021)   Received from [facility]   Overall Financial Resource Strain (CARDIA)    Difficulty of Paying Living Expenses: Not hard at all  Food Insecurity: No Food Insecurity (11/23/2020)   Received  from [facility], [facility]   Hunger Vital Sign    Worried About Running Out of Food in the Last Year: Never true    Ran Out of Food in the Last Year: Never true  Transportation Needs: No Transportation Needs (11/04/2021)   Received from [facility], [facility]   PRAPARE - Transportation    Lack of Transportation (Medical): No    Lack of Transportation (Non-Medical): No  Physical Activity: Not on file  Stress: No Stress Concern Present (11/03/2021)   Received from [facility], [facility] of Occupational Health - Occupational Stress Questionnaire    Feeling of Stress : Only a little  Social Connections: Unknown (04/22/2022)  Received from [facility], [facility]   Social Network    Social Network: Not on file  Intimate Partner Violence: Unknown (03/14/2022)   Received from [facility], [facility]   HITS    Physically Hurt: Not on file    Insult or Talk Down To: Not on file    Threaten Physical Harm: Not on file    Scream or Curse: Not on file    Tobacco Use: Medium Risk (12/28/2023)   Patient History    Smoking Tobacco Use: Former    Smokeless Tobacco Use: Never    Passive Exposure: Not on file   Social History   Substance and Sexual Activity  Alcohol Use No    Family History  Problem Relation Age of Onset   Breast cancer Mother    Heart disease Father        PPM    Review of Systems  Constitutional:  Negative for chills and fever.  HENT:  Negative for congestion, sore throat and tinnitus.   Eyes:  Negative for double vision, photophobia and pain.  Respiratory:  Negative for cough, shortness of breath and wheezing.   Cardiovascular:  Negative for chest pain, palpitations and orthopnea.  Gastrointestinal:  Negative for heartburn, nausea and vomiting.  Genitourinary:  Negative for dysuria, frequency and urgency.  Musculoskeletal:  Positive for joint pain.  Neurological:  Negative for dizziness, weakness and headaches.     Objective:  Physical Exam: Well nourished and well developed.  General: Alert and oriented x3, cooperative and pleasant, no acute distress.  Head: normocephalic, atraumatic, neck supple.  Eyes: EOMI.  Musculoskeletal:  Left knee shows no effusion. Approximately 10-degree flexion contracture and can flex to 120 degrees. Marked crepitus on range of motion. Tender medial and lateral. No instability about the knee.  Calves soft and nontender. Motor function intact in LE. Strength 5/5 LE bilaterally. Neuro: Distal pulses 2+. Sensation to light touch intact in LE.   Imaging Review Plain radiographs demonstrate severe degenerative joint disease of the left knee. The overall alignment is neutral. The bone quality appears to be adequate for age and reported activity level.  Assessment/Plan:  End stage arthritis, left knee   The patient history, physical examination, clinical judgment of the provider and imaging studies are consistent with end stage degenerative joint disease of the left knee and total knee arthroplasty is deemed medically necessary. The treatment options including medical management, injection therapy arthroscopy and arthroplasty were discussed at length. The risks and benefits of total knee arthroplasty were presented and reviewed. The risks due to aseptic loosening, infection, stiffness, patella tracking problems, thromboembolic complications and other imponderables were discussed. The patient acknowledged the explanation, agreed to proceed with the plan and consent was signed. Patient is being admitted for inpatient treatment for surgery, pain control, PT, OT, prophylactic antibiotics, VTE prophylaxis, progressive ambulation and ADLs and discharge planning. The patient is planning to be discharged to skilled nursing facility.  Anticipated LOS equal to or greater than 2 midnights due to - Age 42 and older with one or more of the following:  - Obesity  - Expected need for  hospital services (PT, OT, Nursing) required for safe  discharge  - Anticipated need for postoperative skilled nursing care or inpatient rehab  - Active co-morbidities: Diabetes OR   - Unanticipated findings during/Post Surgery: None  - Patient is a high risk of re-admission due to: Barriers to post-acute care (logistical, no family support in home)  Therapy Plans: SNF Disposition: SNF  Planned DVT Prophylaxis: Aspirin 81 mg BID DME Needed: None PCP: Donzetta Sprung, MD (clearance received) TXA: IV Allergies: Sulfa (rash) Metal Allergy: None Anesthesia Concerns: None BMI: 38.3 Last HgbA1c: 6.6% (11/2023) - rechecking w/ preop labs Pain Regimen: Oxycodone, tramadol Pharmacy: Printed  Other: - Indwelling catheter, seeing urology tomorrow - Stopping bydureon 10 days prior  - Patient was instructed on what medications to stop prior to surgery. - Follow-up visit in 2 weeks with Dr. Lequita Halt - Begin physical therapy following surgery - Pre-operative lab work as pre-surgical testing - Prescriptions will be provided in hospital at time of discharge  Arther Abbott, PA-C Orthopedic Surgery [facility] [place]

## 2024-02-10 ENCOUNTER — Ambulatory Visit: Payer: Medicare Other | Admitting: Urology

## 2024-02-10 ENCOUNTER — Encounter: Payer: Self-pay | Admitting: Urology

## 2024-02-10 VITALS — BP 157/74 | HR 109

## 2024-02-10 DIAGNOSIS — R102 Pelvic and perineal pain: Secondary | ICD-10-CM | POA: Diagnosis not present

## 2024-02-10 DIAGNOSIS — R32 Unspecified urinary incontinence: Secondary | ICD-10-CM | POA: Diagnosis not present

## 2024-02-10 MED ORDER — MYRBETRIQ 50 MG PO TB24: 50.0000 mg | ORAL_TABLET | Freq: Every day | ORAL | 11 refills | Status: DC

## 2024-02-10 NOTE — Progress Notes (Signed)
 02/10/2024 2:17 PM   Kathryn Ryan 1941/01/24 [id number]  Referring provider: Richardean Chimera, MD [address] [place],  [place] [address]  Pelvic pain   HPI: [name] is a 83yo here for evaluation of pelvic pain and urinary incontinence. She was having urinary incontinence using 5-6 diapers per day. She had a foley placed 6 weeks ago and she had been having pelvic pain so severe she presented to the ER. She has a constant urge to urinate. She was started on mirabegron 50mg  which improved her bladder spasms. She has had 3 UTIs in the past 5 weeks. She is scheduled for knee replacement the end of March   PMH: Past Medical History:  Diagnosis Date   Anxiety    Diabetes (HCC) 08/04/2017   Diabetes mellitus without complication (HCC)    new onset diabetic    GERD (gastroesophageal reflux disease)    H/O hiatal hernia    Hypertension    Hypothyroidism    Lumbar radiculopathy    Neuropathy    non related to DM-not sure why   Neuropathy    non DM related   Spinal stenosis of lumbar region     Surgical History: Past Surgical History:  Procedure Laterality Date   ABDOMINAL HYSTERECTOMY     BACK SURGERY     BREAST SURGERY     bilateral lumpectomies   BUNIONECTOMY     bilateral   CHOLECYSTECTOMY     HARDWARE REMOVAL Left 04/26/2014   Procedure: HARDWARE REMOVAL LEFT ANKLE;  Surgeon: Toni Arthurs, MD;  Location: [facility] OR;  Service: Orthopedics;  Laterality: Left;   I & D EXTREMITY Left 04/26/2014   Procedure: IRRIGATION AND DEBRIDEMENT LEFT ANKLE;  Surgeon: Toni Arthurs, MD;  Location: [facility] OR;  Service: Orthopedics;  Laterality: Left;   IRRIGATION AND DEBRIDEMENT ABSCESS Left 04/26/2014   LEFT OOPHORECTOMY     PERIPHERALLY INSERTED CENTRAL CATHETER INSERTION  04/26/2014   rt upper arm   TONSILLECTOMY     6 yrs    Home Medications:  Allergies as of 02/10/2024       Reactions   Statins Other (See Comments)   Joint pain    Sulfa Antibiotics Itching        Medication List         Accurate as of February 10, 2024  2:17 PM. If you have any questions, ask your nurse or doctor.          atorvastatin 10 MG tablet Commonly known as: LIPITOR Take 10 mg by mouth every morning.   Bydureon BCise 2 MG/0.85ML Auij Generic drug: Exenatide ER Inject 2 mg into the skin once a week.   cholecalciferol 25 MCG (1000 UNIT) tablet Commonly known as: VITAMIN D3 Take 2,000 Units by mouth every morning.   Cranberry 450 MG Caps Take 450 mg by mouth every morning.   DULoxetine 60 MG capsule Commonly known as: CYMBALTA Take 60 mg by mouth 2 (two) times daily.   HYDROcodone-acetaminophen 5-325 mg Tabs tablet Commonly known as: VICODIN Take 1 tablet by mouth at bedtime.   Lantus SoloStar 100 UNIT/ML Solostar Pen Generic drug: insulin glargine Inject 120 Units into the skin at bedtime.   levothyroxine 150 MCG tablet Commonly known as: SYNTHROID Take 150 mcg by mouth daily before breakfast.   Melatonin 12 MG Tabs Take 12 mg by mouth at bedtime.   metFORMIN 500 MG 24 hr tablet Commonly known as: GLUCOPHAGE-XR Take 500 mg by mouth daily.  Myrbetriq 50 MG Tb24 tablet Generic drug: mirabegron ER Take 50 mg by mouth daily.   omeprazole 20 MG capsule Commonly known as: PRILOSEC Take 20 mg by mouth every morning.   pregabalin 200 MG capsule Commonly known as: LYRICA Take 200 mg by mouth 2 (two) times daily.   traZODone 100 MG tablet Commonly known as: DESYREL Take 100 mg by mouth at bedtime.        Allergies:  Allergies  Allergen Reactions   Statins Other (See Comments)    Joint pain    Sulfa Antibiotics Itching    Family History: Family History  Problem Relation Age of Onset   Breast cancer Mother    Heart disease Father        PPM    Social History:  reports that she quit smoking about 28 years ago. Her smoking use included cigarettes. She started smoking about 55 years ago. She has a 52.4 pack-year smoking history. She has never used smokeless  tobacco. She reports that she does not drink alcohol and does not use drugs.  ROS: All other review of systems were reviewed and are negative except what is noted above in HPI  Physical Exam: BP (!) 157/74   Pulse (!) 109   Constitutional:  Alert and oriented, No acute distress. HEENT: [place] AT, moist mucus membranes.  Trachea midline, no masses. Cardiovascular: No clubbing, cyanosis, or edema. Respiratory: Normal respiratory effort, no increased work of breathing. GI: Abdomen is soft, nontender, nondistended, no abdominal masses GU: No CVA tenderness.  Lymph: No cervical or inguinal lymphadenopathy. Skin: No rashes, bruises or suspicious lesions. Neurologic: Grossly intact, no focal deficits, moving all 4 extremities. Psychiatric: Normal mood and affect.  Laboratory Data: Lab Results  Component Value Date   WBC 7.4 04/12/2023   HGB 11.1 (L) 04/12/2023   HCT 37.3 04/12/2023   MCV 85.6 04/12/2023   PLT 167 04/12/2023    Lab Results  Component Value Date   CREATININE 0.76 04/12/2023    No results found for: "PSA"  No results found for: "TESTOSTERONE"  Lab Results  Component Value Date   HGBA1C 7.0 (H) 04/25/2014    Urinalysis    Component Value Date/Time   COLORURINE YELLOW 08/08/2023 1628   APPEARANCEUR CLEAR 08/08/2023 1628   LABSPEC 1.009 08/08/2023 1628   PHURINE 6.0 08/08/2023 1628   GLUCOSEU NEGATIVE 08/08/2023 1628   HGBUR NEGATIVE 08/08/2023 1628   BILIRUBINUR NEGATIVE 08/08/2023 1628   KETONESUR NEGATIVE 08/08/2023 1628   PROTEINUR NEGATIVE 08/08/2023 1628   UROBILINOGEN 1.0 04/25/2014 1255   NITRITE POSITIVE (A) 08/08/2023 1628   LEUKOCYTESUR SMALL (A) 08/08/2023 1628    Lab Results  Component Value Date   BACTERIA RARE (A) 08/08/2023    Pertinent Imaging:  No results found for this or any previous visit.  No results found for this or any previous visit.  No results found for this or any previous visit.  No results found for this or any  previous visit.  No results found for this or any previous visit.  No results found for this or any previous visit.  No results found for this or any previous visit.  No results found for this or any previous visit.   Assessment & Plan:    1. Urinary incontinence, unspecified type (Primary) Continue mirabegron 50mg , followup 1 week for a voiding trial  2. Pelvic pain Likely related to bladder spasms.   No follow-ups on file.  Wilkie Aye, MD  [facility]  [place]

## 2024-02-10 NOTE — Patient Instructions (Signed)

## 2024-02-15 ENCOUNTER — Ambulatory Visit (INDEPENDENT_AMBULATORY_CARE_PROVIDER_SITE_OTHER)

## 2024-02-15 DIAGNOSIS — R32 Unspecified urinary incontinence: Secondary | ICD-10-CM

## 2024-02-15 NOTE — Addendum Note (Signed)
 Addended by: Sarajane Jews on: 02/15/2024 04:10 PM   Modules accepted: Level of Service

## 2024-02-15 NOTE — Progress Notes (Signed)
 Bladder Scan completed today.  Patient cannot void prior to the bladder scan. Bladder scan result: 58  Performed By: Alfonse Spruce. CMA  Additional notes: Per MD f/u scheduled 03/17 w/ PVR

## 2024-02-15 NOTE — Progress Notes (Cosign Needed Addendum)
 Fill and Pull Catheter Removal  Patient is present today for a catheter removal.  Due to 3 bladder spasms per MD okay to remove catheter. 10ml of water was then drained from the balloon.  A 16 FR foley cath was removed from the bladder no complications were noted. Foley catheter intact and time of removal. Patient tolerated well. Patient asked to drink some water and wait in waiting room until urge to urinate. She will return for a PVR in an hour. One oral prophylactic antibiotic given per MD orders  Performed by: Gwendolyn Grant T. CMA  Follow up/ Additional notes: as scheduled

## 2024-02-22 ENCOUNTER — Telehealth: Payer: Self-pay

## 2024-02-22 ENCOUNTER — Ambulatory Visit

## 2024-02-22 DIAGNOSIS — R32 Unspecified urinary incontinence: Secondary | ICD-10-CM

## 2024-02-22 NOTE — Progress Notes (Signed)
 Bladder Scan completed today.  Patient can void prior to the bladder scan. Bladder scan result: 38  Performed By: Kennyth Lose, CMA  Additional notes-

## 2024-02-22 NOTE — Telephone Encounter (Signed)
 Pt called stating her new Rx is working Surveyor, quantity) states she is still wetting the bed and if this issues isn't resolved she would no longer come back. Pt was advised that her MD would be notified and if needed  would make needed adjustments to the Rx

## 2024-02-25 NOTE — Patient Instructions (Signed)
 SURGICAL WAITING ROOM VISITATION  Patients having surgery or a procedure may have no more than 2 support people in the waiting area - these visitors may rotate.    Children under the age of 31 must have an adult with them who is not the patient.  Due to an increase in RSV and influenza rates and associated hospitalizations, children ages 25 and under may not visit patients in [facility] hospitals.  Visitors with respiratory illnesses are discouraged from visiting and should remain at home.  If the patient needs to stay at the hospital during part of their recovery, the visitor guidelines for inpatient rooms apply. Pre-op nurse will coordinate an appropriate time for 1 support person to accompany patient in pre-op.  This support person may not rotate.    Please refer to the [facility] website for the visitor guidelines for Inpatients (after your surgery is over and you are in a regular room).    Your procedure is scheduled on: 03/07/24   Report to [facility] Main Entrance    Report to admitting at 10:10 AM   Call this number if you have problems the morning of surgery [phone]   Do not eat food :After Midnight.   After Midnight you may have the following liquids until 9:40 AM DAY OF SURGERY  Water Non-Citrus Juices (without pulp, NO RED-Apple, White grape, White cranberry) Black Coffee (NO MILK/CREAM OR CREAMERS, sugar ok)  Clear Tea (NO MILK/CREAM OR CREAMERS, sugar ok) regular and decaf                             Plain Jell-O (NO RED)                                           Fruit ices (not with fruit pulp, NO RED)                                     Popsicles (NO RED)                                                               Sports drinks like Gatorade (NO RED)    The day of surgery:  Drink ONE (1) Pre-Surgery G2 at 9:40 AM the morning of surgery. Drink in one sitting. Do not sip.  This drink was given to you during your hospital  pre-op appointment  visit. Nothing else to drink after completing the  Pre-Surgery G2.          If you have questions, please contact your surgeon's office.   FOLLOW BOWEL PREP AND ANY ADDITIONAL PRE OP INSTRUCTIONS YOU RECEIVED FROM YOUR SURGEON'S OFFICE!!!     Oral Hygiene is also important to reduce your risk of infection.                                    Remember - BRUSH YOUR TEETH THE MORNING OF SURGERY WITH YOUR REGULAR TOOTHPASTE  DENTURES WILL BE  REMOVED PRIOR TO SURGERY PLEASE DO NOT APPLY "Poly grip" OR ADHESIVES!!!   Stop all vitamins and herbal supplements 7 days before surgery.   Take these medicines the morning of surgery with A SIP OF WATER: Atorvastatin, Duloxetine, Levothyroxine, Pantoprazole   DO NOT TAKE ANY ORAL DIABETIC MEDICATIONS DAY OF YOUR SURGERY  How to Manage Your Diabetes Before and After Surgery  Why is it important to control my blood sugar before and after surgery? Improving blood sugar levels before and after surgery helps healing and can limit problems. A way of improving blood sugar control is eating a healthy diet by:  Eating less sugar and carbohydrates  Increasing activity/exercise  Talking with your doctor about reaching your blood sugar goals High blood sugars (greater than 180 mg/dL) can raise your risk of infections and slow your recovery, so you will need to focus on controlling your diabetes during the weeks before surgery. Make sure that the doctor who takes care of your diabetes knows about your planned surgery including the date and location.  How do I manage my blood sugar before surgery? Check your blood sugar at least 4 times a day, starting 2 days before surgery, to make sure that the level is not too high or low. Check your blood sugar the morning of your surgery when you wake up and every 2 hours until you get to the Short Stay unit. If your blood sugar is less than 70 mg/dL, you will need to treat for low blood sugar: Do not take  insulin. Treat a low blood sugar (less than 70 mg/dL) with  cup of clear juice (cranberry or apple), 4 glucose tablets, OR glucose gel. Recheck blood sugar in 15 minutes after treatment (to make sure it is greater than 70 mg/dL). If your blood sugar is not greater than 70 mg/dL on recheck, call [phone] for further instructions. Report your blood sugar to the short stay nurse when you get to Short Stay.  If you are admitted to the hospital after surgery: Your blood sugar will be checked by the staff and you will probably be given insulin after surgery (instead of oral diabetes medicines) to make sure you have good blood sugar levels. The goal for blood sugar control after surgery is 80-180 mg/dL.   WHAT DO I DO ABOUT MY DIABETES MEDICATION?  Do not take oral diabetes medicines (pills) the morning of surgery.  Do not take Bydureon for 7 days  THE DAY BEFORE SURGERY, take Metformin as prescribed. Take 50% of Lantus.      THE MORNING OF SURGERY, do not take Metformin.  DO NOT TAKE THE FOLLOWING 7 DAYS PRIOR TO SURGERY: Ozempic, Wegovy, Rybelsus (Semaglutide), Byetta (exenatide), Bydureon (exenatide ER), Victoza, Saxenda (liraglutide), or Trulicity (dulaglutide) Mounjaro (Tirzepatide) Adlyxin (Lixisenatide), Polyethylene Glycol Loxenatide.  Reviewed and Endorsed by [facility] Patient Education Committee, August 2015             You may not have any metal on your body including hair pins, jewelry, and body piercing             Do not wear make-up, lotions, powders, perfumes, or deodorant  Do not wear nail polish including gel and S&S, artificial/acrylic nails, or any other type of covering on natural nails including finger and toenails. If you have artificial nails, gel coating, etc. that needs to be removed by a nail salon please have this removed prior to surgery or surgery may need to be canceled/ delayed if the surgeon/  anesthesia feels like they are unable to be safely monitored.    Do not shave  48 hours prior to surgery.    Do not bring valuables to the hospital. [facility] IS NOT             RESPONSIBLE   FOR VALUABLES.   Contacts, glasses, dentures or bridgework may not be worn into surgery.   Bring small overnight bag day of surgery.   DO NOT BRING YOUR HOME MEDICATIONS TO THE HOSPITAL. PHARMACY WILL DISPENSE MEDICATIONS LISTED ON YOUR MEDICATION LIST TO YOU DURING YOUR ADMISSION IN THE HOSPITAL!               Please read over the following fact sheets you were given: IF YOU HAVE QUESTIONS ABOUT YOUR PRE-OP INSTRUCTIONS PLEASE CALL [phone] Contras    If you received a COVID test during your pre-op visit  it is requested that you wear a mask when out in public, stay away from anyone that may not be feeling well and notify your surgeon if you develop symptoms. If you test positive for Covid or have been in contact with anyone that has tested positive in the last 10 days please notify you surgeon.      Pre-operative 5 CHG Bath Instructions   You can play a key role in reducing the risk of infection after surgery. Your skin needs to be as free of germs as possible. You can reduce the number of germs on your skin by washing with CHG (chlorhexidine gluconate) soap before surgery. CHG is an antiseptic soap that kills germs and continues to kill germs even after washing.   DO NOT use if you have an allergy to chlorhexidine/CHG or antibacterial soaps. If your skin becomes reddened or irritated, stop using the CHG and notify one of our RNs at [phone].   Please shower with the CHG soap starting 4 days before surgery using the following schedule:     Please keep in mind the following:  DO NOT shave, including legs and underarms, starting the day of your first shower.   You may shave your face at any point before/day of surgery.  Place clean sheets on your bed the day you start using CHG soap. Use a clean washcloth (not used since being washed) for each  shower. DO NOT sleep with pets once you start using the CHG.   CHG Shower Instructions:  If you choose to wash your hair and private area, wash first with your normal shampoo/soap.  After you use shampoo/soap, rinse your hair and body thoroughly to remove shampoo/soap residue.  Turn the water OFF and apply about 3 tablespoons (45 ml) of CHG soap to a CLEAN washcloth.  Apply CHG soap ONLY FROM YOUR NECK DOWN TO YOUR TOES (washing for 3-5 minutes)  DO NOT use CHG soap on face, private areas, open wounds, or sores.  Pay special attention to the area where your surgery is being performed.  If you are having back surgery, having someone wash your back for you may be helpful. Wait 2 minutes after CHG soap is applied, then you may rinse off the CHG soap.  Pat dry with a clean towel  Put on clean clothes/pajamas   If you choose to wear lotion, please use ONLY the CHG-compatible lotions on the back of this paper.     Additional instructions for the day of surgery: DO NOT APPLY any lotions, deodorants, cologne, or perfumes.   Put on clean/comfortable clothes.  Brush  your teeth.  Ask your nurse before applying any prescription medications to the skin.      CHG Compatible Lotions   Aveeno Moisturizing lotion  Cetaphil Moisturizing Cream  Cetaphil Moisturizing Lotion  Clairol Herbal Essence Moisturizing Lotion, Dry Skin  Clairol Herbal Essence Moisturizing Lotion, Extra Dry Skin  Clairol Herbal Essence Moisturizing Lotion, Normal Skin  Curel Age Defying Therapeutic Moisturizing Lotion with Alpha Hydroxy  Curel Extreme Care Body Lotion  Curel Soothing Hands Moisturizing Hand Lotion  Curel Therapeutic Moisturizing Cream, Fragrance-Free  Curel Therapeutic Moisturizing Lotion, Fragrance-Free  Curel Therapeutic Moisturizing Lotion, Original Formula  Eucerin Daily Replenishing Lotion  Eucerin Dry Skin Therapy Plus Alpha Hydroxy Crme  Eucerin Dry Skin Therapy Plus Alpha Hydroxy Lotion   Eucerin Original Crme  Eucerin Original Lotion  Eucerin Plus Crme Eucerin Plus Lotion  Eucerin TriLipid Replenishing Lotion  Keri Anti-Bacterial Hand Lotion  Keri Deep Conditioning Original Lotion Dry Skin Formula Softly Scented  Keri Deep Conditioning Original Lotion, Fragrance Free Sensitive Skin Formula  Keri Lotion Fast Absorbing Fragrance Free Sensitive Skin Formula  Keri Lotion Fast Absorbing Softly Scented Dry Skin Formula  Keri Original Lotion  Keri Skin Renewal Lotion Keri Silky Smooth Lotion  Keri Silky Smooth Sensitive Skin Lotion  Nivea Body Creamy Conditioning Oil  Nivea Body Extra Enriched Lotion  Nivea Body Original Lotion  Nivea Body Sheer Moisturizing Lotion Nivea Crme  Nivea Skin Firming Lotion  NutraDerm 30 Skin Lotion  NutraDerm Skin Lotion  NutraDerm Therapeutic Skin Cream  NutraDerm Therapeutic Skin Lotion  ProShield Protective Hand Cream  Provon moisturizing lotion   Incentive Spirometer  An incentive spirometer is a tool that can help keep your lungs clear and active. This tool measures how well you are filling your lungs with each breath. Taking long deep breaths may help reverse or decrease the chance of developing breathing (pulmonary) problems (especially infection) following: A long period of time when you are unable to move or be active. BEFORE THE PROCEDURE  If the spirometer includes an indicator to show your best effort, your nurse or respiratory therapist will set it to a desired goal. If possible, sit up straight or lean slightly forward. Try not to slouch. Hold the incentive spirometer in an upright position. INSTRUCTIONS FOR USE  Sit on the edge of your bed if possible, or sit up as far as you can in bed or on a chair. Hold the incentive spirometer in an upright position. Breathe out normally. Place the mouthpiece in your mouth and seal your lips tightly around it. Breathe in slowly and as deeply as possible, raising the piston or the ball  toward the top of the column. Hold your breath for 3-5 seconds or for as long as possible. Allow the piston or ball to fall to the bottom of the column. Remove the mouthpiece from your mouth and breathe out normally. Rest for a few seconds and repeat Steps 1 through 7 at least 10 times every 1-2 hours when you are awake. Take your time and take a few normal breaths between deep breaths. The spirometer may include an indicator to show your best effort. Use the indicator as a goal to work toward during each repetition. After each set of 10 deep breaths, practice coughing to be sure your lungs are clear. If you have an incision (the cut made at the time of surgery), support your incision when coughing by placing a pillow or rolled up towels firmly against it. Once you are able to get  out of bed, walk around indoors and cough well. You may stop using the incentive spirometer when instructed by your caregiver.  RISKS AND COMPLICATIONS Take your time so you do not get dizzy or light-headed. If you are in pain, you may need to take or ask for pain medication before doing incentive spirometry. It is harder to take a deep breath if you are having pain. AFTER USE Rest and breathe slowly and easily. It can be helpful to keep track of a log of your progress. Your caregiver can provide you with a simple table to help with this. If you are using the spirometer at home, follow these instructions: SEEK MEDICAL CARE IF:  You are having difficultly using the spirometer. You have trouble using the spirometer as often as instructed. Your pain medication is not giving enough relief while using the spirometer. You develop fever of 100.5 F (38.1 C) or higher. SEEK IMMEDIATE MEDICAL CARE IF:  You cough up bloody sputum that had not been present before. You develop fever of 102 F (38.9 C) or greater. You develop worsening pain at or near the incision site. MAKE SURE YOU:  Understand these instructions. Will  watch your condition. Will get help right away if you are not doing well or get worse. Document Released: 04/06/2007 Document Revised: 02/16/2012 Document Reviewed: 06/07/2007 [facility] Patient Information 2014 [place], [place].   ________________________________________________________________________

## 2024-02-25 NOTE — Progress Notes (Signed)
 COVID Vaccine Completed:  Date of COVID positive in last 90 days:  PCP - Donzetta Sprung, MD Cardiologist - Dr. Purvis Sheffield LOV 09/09/16  Chest x-ray -  EKG -  Stress Test - 07/21/16 Epic ECHO -  Cardiac Cath -  Pacemaker/ICD device last checked: Spinal Cord Stimulator:  Bowel Prep -   Sleep Study -  CPAP -   Fasting Blood Sugar -  Checks Blood Sugar _____ times a day  Last dose of GLP1 agonist-  N/A GLP1 instructions:  Hold 7 days before surgery    Last dose of SGLT-2 inhibitors-  N/A SGLT-2 instructions:  Hold 3 days before surgery    Blood Thinner Instructions:  Last dose:   Time: Aspirin Instructions: Last Dose:  Activity level:  Can go up a flight of stairs and perform activities of daily living without stopping and without symptoms of chest pain or shortness of breath.  Able to exercise without symptoms  Unable to go up a flight of stairs without symptoms of     Anesthesia review:   Patient denies shortness of breath, fever, cough and chest pain at PAT appointment  Patient verbalized understanding of instructions that were given to them at the PAT appointment. Patient was also instructed that they will need to review over the PAT instructions again at home before surgery.

## 2024-02-26 ENCOUNTER — Other Ambulatory Visit: Payer: Self-pay

## 2024-02-26 ENCOUNTER — Encounter (HOSPITAL_COMMUNITY)
Admission: RE | Admit: 2024-02-26 | Discharge: 2024-02-26 | Disposition: A | Discharge status: Home / Self Care | Payer: Medicare Other | Source: Ambulatory Visit | Attending: Orthopedic Surgery | Admitting: Orthopedic Surgery

## 2024-02-26 ENCOUNTER — Encounter (HOSPITAL_COMMUNITY): Payer: Self-pay

## 2024-02-26 VITALS — BP 143/78 | HR 100 | Temp 98.1°F | Resp 16 | Ht 65.0 in | Wt 226.0 lb

## 2024-02-26 DIAGNOSIS — E119 Type 2 diabetes mellitus without complications: Secondary | ICD-10-CM | POA: Diagnosis not present

## 2024-02-26 DIAGNOSIS — R9431 Abnormal electrocardiogram [ECG] [EKG]: Secondary | ICD-10-CM | POA: Insufficient documentation

## 2024-02-26 DIAGNOSIS — M1711 Unilateral primary osteoarthritis, right knee: Secondary | ICD-10-CM | POA: Insufficient documentation

## 2024-02-26 DIAGNOSIS — Z01818 Encounter for other preprocedural examination: Secondary | ICD-10-CM | POA: Diagnosis not present

## 2024-02-26 LAB — CBC
HCT: 39.3 % (ref 36.0–46.0)
Hemoglobin: 12.3 g/dL (ref 12.0–15.0)
MCH: 27.2 pg (ref 26.0–34.0)
MCHC: 31.3 g/dL (ref 30.0–36.0)
MCV: 86.9 fL (ref 80.0–100.0)
Platelets: 226 10*3/uL (ref 150–400)
RBC: 4.52 MIL/uL (ref 3.87–5.11)
RDW: 15.4 % (ref 11.5–15.5)
WBC: 9.4 10*3/uL (ref 4.0–10.5)
nRBC: 0 % (ref 0.0–0.2)

## 2024-02-26 LAB — BASIC METABOLIC PANEL
Anion gap: 10 (ref 5–15)
BUN: 10 mg/dL (ref 8–23)
CO2: 27 mmol/L (ref 22–32)
Calcium: 9.1 mg/dL (ref 8.9–10.3)
Chloride: 99 mmol/L (ref 98–111)
Creatinine, Ser: 0.69 mg/dL (ref 0.44–1.00)
GFR, Estimated: >60 mL/min (ref >60)
Glucose, Bld: 146 mg/dL — ABNORMAL HIGH (ref 70–99)
Potassium: 4.5 mmol/L (ref 3.5–5.1)
Sodium: 136 mmol/L (ref 135–145)

## 2024-02-26 LAB — HEMOGLOBIN A1C
Hgb A1c MFr Bld: 6.3 % — ABNORMAL HIGH (ref 4.8–5.6)
Mean Plasma Glucose: 134.11 mg/dL

## 2024-02-26 LAB — GLUCOSE, CAPILLARY: Glucose-Capillary: 126 mg/dL — ABNORMAL HIGH (ref 70–99)

## 2024-02-29 LAB — SURGICAL PCR SCREEN
MRSA, PCR: NEGATIVE
Staphylococcus aureus: POSITIVE — AB

## 2024-02-29 NOTE — Progress Notes (Signed)
 [facility]+ results routed to Dr. Despina Hick

## 2024-03-04 NOTE — Anesthesia Preprocedure Evaluation (Addendum)
 Anesthesia Evaluation  Patient identified by MRN, date of birth, ID band Patient awake    Reviewed: Allergy & Precautions, NPO status , Patient's Chart, lab work & pertinent test results  History of Anesthesia Complications (+) PROLONGED EMERGENCE and history of anesthetic complications  Airway Mallampati: III  TM Distance: >3 FB Neck ROM: Full   Comment: Previous grade I view with MAC 3, easy mask Dental  (+) Dental Advisory Given, Edentulous Upper, Edentulous Lower   Pulmonary neg shortness of breath, sleep apnea (does not use CPAP) , neg COPD, neg recent URI, former smoker   Pulmonary exam normal breath sounds clear to auscultation       Cardiovascular hypertension, (-) angina (-) Past MI, (-) Cardiac Stents and (-) CABG (-) dysrhythmias  Rhythm:Regular Rate:Normal  HLD  Normal stress test 07/21/2016   Neuro/Psych neg Seizures PSYCHIATRIC DISORDERS Anxiety Depression     Neuromuscular disease (neuropathy, lumbar radiculopathy)    GI/Hepatic hiatal hernia,GERD  Medicated,,Fatty liver   Endo/Other  diabetes (Hgb A1c 6.3), Well Controlled, Type 2, Insulin Dependent, Oral Hypoglycemic AgentsHypothyroidism  Left adrenal mass  Renal/GU CRFRenal disease     Musculoskeletal  (+) Arthritis , Osteoarthritis,    Abdominal  (+) + obese  Peds  Hematology  (+) Blood dyscrasia, anemia Lab Results      Component                Value               Date                      WBC                      9.4                 02/26/2024                HGB                      12.3                02/26/2024                HCT                      39.3                02/26/2024                MCV                      86.9                02/26/2024                PLT                      226                 02/26/2024              Anesthesia Other Findings Superficial wounds on left shin  Previous back surgeries with fusion at L3-S1.  Discussed spinal with possibility of not being successful due to previous back surgeries.  Reproductive/Obstetrics  Anesthesia Physical Anesthesia Plan  ASA: 3  Anesthesia Plan: MAC and Spinal   Post-op Pain Management: Tylenol PO (pre-op)* and Regional block*   Induction: Intravenous  PONV Risk Score and Plan: 3 and Ondansetron, Dexamethasone, Propofol infusion and Treatment may vary due to age or medical condition  Airway Management Planned: Natural Airway and Simple Face Mask  Additional Equipment:   Intra-op Plan:   Post-operative Plan:   Informed Consent: I have reviewed the patients History and Physical, chart, labs and discussed the procedure including the risks, benefits and alternatives for the proposed anesthesia with the patient or authorized representative who has indicated his/her understanding and acceptance.     Dental advisory given  Plan Discussed with: CRNA and Anesthesiologist  Anesthesia Plan Comments: (Discussed potential risks of nerve blocks including, but not limited to, infection, bleeding, nerve damage, seizures, pneumothorax, respiratory depression, and potential failure of the block. Alternatives to nerve blocks discussed. All questions answered.  I have discussed risks of neuraxial anesthesia including but not limited to infection, bleeding, nerve injury, back pain, headache, seizures, and failure of block. Patient denies bleeding disorders and is not currently anticoagulated. Labs have been reviewed. Risks and benefits discussed. All patient's questions answered.   Discussed with patient risks of MAC including, but not limited to, minor pain or discomfort, hearing people in the room, and possible need for backup general anesthesia. Risks for general anesthesia also discussed including, but not limited to, sore throat, hoarse voice, chipped/damaged teeth, injury to vocal cords, nausea and vomiting,  allergic reactions, lung infection, heart attack, stroke, and death. All questions answered. )        Anesthesia Quick Evaluation

## 2024-03-04 NOTE — Progress Notes (Signed)
 Anesthesia Chart Review   Case: [id number] Date/Time: 03/07/24 1225   Procedure: ARTHROPLASTY, KNEE, TOTAL (Left: Knee)   Anesthesia type: Choice   Pre-op diagnosis: left knee osteoarthritis   Location: [facility] ROOM 10 / [facility]   Surgeons: Ollen Gross, MD       DISCUSSION:83 y.o. former smoker with h/o HTN, hypothyroidism, DM II, left knee OA scheduled for above procedure 03/07/2024 with Dr. Ollen Gross.   Pt reports she had a recent fall with superficial wounds to her left shin. She was advised to contact Dr. Deri Fuelling office.  I discussed with Arther Abbott, PA as well.  VS: BP (!) 143/78   Pulse 100   Temp 36.7 C (Oral)   Resp 16   Ht 5\' 5"  (1.651 m)   Wt 102.5 kg   SpO2 92%   BMI 37.61 kg/m   PROVIDERS: Richardean Chimera, MD is PCP    LABS: Labs reviewed: Acceptable for surgery. (all labs ordered are listed, but only abnormal results are displayed)  Labs Reviewed  SURGICAL PCR SCREEN - Abnormal; Notable for the following components:      Result Value   Staphylococcus aureus POSITIVE (*)    All other components within normal limits  HEMOGLOBIN A1C - Abnormal; Notable for the following components:   Hgb A1c MFr Bld 6.3 (*)    All other components within normal limits  BASIC METABOLIC PANEL - Abnormal; Notable for the following components:   Glucose, Bld 146 (*)    All other components within normal limits  GLUCOSE, CAPILLARY - Abnormal; Notable for the following components:   Glucose-Capillary 126 (*)    All other components within normal limits  CBC     IMAGES:   EKG:   CV:  Past Medical History:  Diagnosis Date   Anxiety    Diabetes (HCC) 08/04/2017   Diabetes mellitus without complication (HCC)    new onset diabetic    GERD (gastroesophageal reflux disease)    H/O hiatal hernia    Hypertension    Hypothyroidism    Lumbar radiculopathy    Neuropathy    non related to DM-not sure why   Neuropathy    non DM related   Spinal stenosis of lumbar  region     Past Surgical History:  Procedure Laterality Date   ABDOMINAL HYSTERECTOMY     BACK SURGERY     BREAST SURGERY     bilateral lumpectomies   BUNIONECTOMY     bilateral   CHOLECYSTECTOMY     HARDWARE REMOVAL Left 04/26/2014   Procedure: HARDWARE REMOVAL LEFT ANKLE;  Surgeon: Toni Arthurs, MD;  Location: [facility] OR;  Service: Orthopedics;  Laterality: Left;   I & D EXTREMITY Left 04/26/2014   Procedure: IRRIGATION AND DEBRIDEMENT LEFT ANKLE;  Surgeon: Toni Arthurs, MD;  Location: [facility] OR;  Service: Orthopedics;  Laterality: Left;   IRRIGATION AND DEBRIDEMENT ABSCESS Left 04/26/2014   LEFT OOPHORECTOMY     PERIPHERALLY INSERTED CENTRAL CATHETER INSERTION  04/26/2014   rt upper arm   TONSILLECTOMY     6 yrs    MEDICATIONS:  atorvastatin (LIPITOR) 10 MG tablet   BYDUREON BCISE 2 MG/0.85ML AUIJ   cholecalciferol (VITAMIN D3) 25 MCG (1000 UNIT) tablet   Cranberry 200 MG CAPS   DULoxetine (CYMBALTA) 60 MG capsule   HYDROcodone-acetaminophen (VICODIN) 5-325 mg TABS tablet   LANTUS SOLOSTAR 100 UNIT/ML Solostar Pen   levothyroxine (SYNTHROID, LEVOTHROID) 150 MCG tablet   Melatonin 12 MG TABS  metFORMIN (GLUCOPHAGE-XR) 500 MG 24 hr tablet   MYRBETRIQ 50 MG TB24 tablet   nitrofurantoin (MACRODANTIN) 100 MG capsule   omeprazole (PRILOSEC) 20 MG capsule   ondansetron (ZOFRAN) 4 MG tablet   traZODone (DESYREL) 100 MG tablet   No current facility-administered medications for this encounter.    Jodell Cipro Ward, PA-C [facility] Pre-Surgical Testing [phone]

## 2024-03-07 ENCOUNTER — Ambulatory Visit (HOSPITAL_COMMUNITY): Payer: Self-pay | Admitting: Physician Assistant

## 2024-03-07 ENCOUNTER — Ambulatory Visit (HOSPITAL_COMMUNITY): Payer: Self-pay | Admitting: Certified Registered"

## 2024-03-07 ENCOUNTER — Other Ambulatory Visit: Payer: Self-pay

## 2024-03-07 ENCOUNTER — Encounter (HOSPITAL_COMMUNITY): Payer: Self-pay | Admitting: Orthopedic Surgery

## 2024-03-07 ENCOUNTER — Encounter (HOSPITAL_COMMUNITY)
Admission: RE | Disposition: A | Discharge status: Skilled Nursing Facility | Payer: Self-pay | Source: Home / Self Care | Attending: Orthopedic Surgery

## 2024-03-07 ENCOUNTER — Observation Stay (HOSPITAL_COMMUNITY)
Admission: RE | Admit: 2024-03-07 | Discharge: 2024-03-09 | Disposition: A | Discharge status: Skilled Nursing Facility | Payer: Medicare Other | Attending: Orthopedic Surgery | Admitting: Orthopedic Surgery

## 2024-03-07 DIAGNOSIS — E119 Type 2 diabetes mellitus without complications: Secondary | ICD-10-CM | POA: Insufficient documentation

## 2024-03-07 DIAGNOSIS — E039 Hypothyroidism, unspecified: Secondary | ICD-10-CM | POA: Diagnosis not present

## 2024-03-07 DIAGNOSIS — E1122 Type 2 diabetes mellitus with diabetic chronic kidney disease: Secondary | ICD-10-CM | POA: Diagnosis not present

## 2024-03-07 DIAGNOSIS — Z7984 Long term (current) use of oral hypoglycemic drugs: Secondary | ICD-10-CM | POA: Diagnosis not present

## 2024-03-07 DIAGNOSIS — N189 Chronic kidney disease, unspecified: Secondary | ICD-10-CM | POA: Diagnosis not present

## 2024-03-07 DIAGNOSIS — Z79899 Other long term (current) drug therapy: Secondary | ICD-10-CM | POA: Diagnosis not present

## 2024-03-07 DIAGNOSIS — I10 Essential (primary) hypertension: Secondary | ICD-10-CM | POA: Diagnosis not present

## 2024-03-07 DIAGNOSIS — G4733 Obstructive sleep apnea (adult) (pediatric): Secondary | ICD-10-CM

## 2024-03-07 DIAGNOSIS — G8918 Other acute postprocedural pain: Secondary | ICD-10-CM | POA: Diagnosis not present

## 2024-03-07 DIAGNOSIS — Z87891 Personal history of nicotine dependence: Secondary | ICD-10-CM | POA: Diagnosis not present

## 2024-03-07 DIAGNOSIS — M1712 Unilateral primary osteoarthritis, left knee: Secondary | ICD-10-CM | POA: Diagnosis not present

## 2024-03-07 DIAGNOSIS — I129 Hypertensive chronic kidney disease with stage 1 through stage 4 chronic kidney disease, or unspecified chronic kidney disease: Secondary | ICD-10-CM | POA: Diagnosis not present

## 2024-03-07 DIAGNOSIS — M1711 Unilateral primary osteoarthritis, right knee: Principal | ICD-10-CM

## 2024-03-07 DIAGNOSIS — M179 Osteoarthritis of knee, unspecified: Secondary | ICD-10-CM | POA: Diagnosis present

## 2024-03-07 HISTORY — PX: TOTAL KNEE ARTHROPLASTY: SHX125

## 2024-03-07 LAB — GLUCOSE, CAPILLARY
Glucose-Capillary: 155 mg/dL — ABNORMAL HIGH (ref 70–99)
Glucose-Capillary: 112 mg/dL — ABNORMAL HIGH (ref 70–99)
Glucose-Capillary: 196 mg/dL — ABNORMAL HIGH (ref 70–99)
Glucose-Capillary: 334 mg/dL — ABNORMAL HIGH (ref 70–99)

## 2024-03-07 SURGERY — ARTHROPLASTY, KNEE, TOTAL
Anesthesia: Monitor Anesthesia Care | Site: Knee | Laterality: Left

## 2024-03-07 MED ORDER — CEFAZOLIN SODIUM-DEXTROSE 2-4 GM/100ML-% IV SOLN
2.0000 g | INTRAVENOUS | Status: AC
  Administered 2024-03-07: 2 g via INTRAVENOUS
  Filled 2024-03-07: qty 100

## 2024-03-07 MED ORDER — ROPIVACAINE HCL 5 MG/ML IJ SOLN
INTRAMUSCULAR | Status: DC | PRN
  Administered 2024-03-07: 20 mL via PERINEURAL

## 2024-03-07 MED ORDER — ONDANSETRON HCL 4 MG/2ML IJ SOLN: 4.0000 mg | Freq: Four times a day (QID) | INTRAMUSCULAR | Status: DC | PRN

## 2024-03-07 MED ORDER — METHOCARBAMOL 1000 MG/10ML IJ SOLN: 500.0000 mg | Freq: Four times a day (QID) | INTRAMUSCULAR | Status: DC | PRN

## 2024-03-07 MED ORDER — METHOCARBAMOL 500 MG PO TABS
500.0000 mg | ORAL_TABLET | Freq: Four times a day (QID) | ORAL | Status: DC | PRN
  Administered 2024-03-07 – 2024-03-09 (×4): 500 mg via ORAL
  Filled 2024-03-07 (×4): qty 1

## 2024-03-07 MED ORDER — BUPIVACAINE LIPOSOME 1.3 % IJ SUSP
INTRAMUSCULAR | Status: AC
  Filled 2024-03-07: qty 20

## 2024-03-07 MED ORDER — TRAMADOL HCL 50 MG PO TABS
50.0000 mg | ORAL_TABLET | Freq: Four times a day (QID) | ORAL | Status: DC | PRN
  Administered 2024-03-07 – 2024-03-08 (×2): 100 mg via ORAL
  Filled 2024-03-07 (×2): qty 2

## 2024-03-07 MED ORDER — FENTANYL CITRATE PF 50 MCG/ML IJ SOSY
50.0000 ug | PREFILLED_SYRINGE | Freq: Once | INTRAMUSCULAR | Status: AC
  Administered 2024-03-07: 50 ug via INTRAVENOUS
  Filled 2024-03-07: qty 1

## 2024-03-07 MED ORDER — METOCLOPRAMIDE HCL 5 MG/ML IJ SOLN: 5.0000 mg | Freq: Three times a day (TID) | INTRAMUSCULAR | Status: DC | PRN

## 2024-03-07 MED ORDER — ACETAMINOPHEN 500 MG PO TABS: 1000.0000 mg | ORAL_TABLET | Freq: Once | ORAL | Status: DC

## 2024-03-07 MED ORDER — PROPOFOL 1000 MG/100ML IV EMUL
INTRAVENOUS | Status: AC
  Filled 2024-03-07: qty 100

## 2024-03-07 MED ORDER — DULOXETINE HCL 60 MG PO CPEP
60.0000 mg | ORAL_CAPSULE | Freq: Two times a day (BID) | ORAL | Status: DC
  Administered 2024-03-07 – 2024-03-09 (×4): 60 mg via ORAL
  Filled 2024-03-07 (×4): qty 1

## 2024-03-07 MED ORDER — METOCLOPRAMIDE HCL 5 MG PO TABS: 5.0000 mg | ORAL_TABLET | Freq: Three times a day (TID) | ORAL | Status: DC | PRN

## 2024-03-07 MED ORDER — PHENOL 1.4 % MT LIQD: 1.0000 | OROMUCOSAL | Status: DC | PRN

## 2024-03-07 MED ORDER — FENTANYL CITRATE PF 50 MCG/ML IJ SOSY: 25.0000 ug | PREFILLED_SYRINGE | INTRAMUSCULAR | Status: DC | PRN

## 2024-03-07 MED ORDER — AMISULPRIDE (ANTIEMETIC) 5 MG/2ML IV SOLN: 10.0000 mg | Freq: Once | INTRAVENOUS | Status: DC | PRN

## 2024-03-07 MED ORDER — INSULIN GLARGINE-YFGN 100 UNIT/ML ~~LOC~~ SOLN
120.0000 [IU] | Freq: Every day | SUBCUTANEOUS | Status: DC
  Administered 2024-03-07 – 2024-03-08 (×2): 120 [IU] via SUBCUTANEOUS
  Filled 2024-03-07 (×3): qty 1.2

## 2024-03-07 MED ORDER — POVIDONE-IODINE 10 % EX SWAB
2.0000 | Freq: Once | CUTANEOUS | Status: AC
  Administered 2024-03-07: 2 via TOPICAL

## 2024-03-07 MED ORDER — OXYCODONE HCL 5 MG PO TABS: 5.0000 mg | ORAL_TABLET | Freq: Once | ORAL | Status: DC | PRN

## 2024-03-07 MED ORDER — INSULIN ASPART 100 UNIT/ML IJ SOLN
0.0000 [IU] | Freq: Three times a day (TID) | INTRAMUSCULAR | Status: DC
  Administered 2024-03-07: 3 [IU] via SUBCUTANEOUS
  Administered 2024-03-08 (×3): 5 [IU] via SUBCUTANEOUS
  Administered 2024-03-09: 3 [IU] via SUBCUTANEOUS
  Administered 2024-03-09: 2 [IU] via SUBCUTANEOUS

## 2024-03-07 MED ORDER — TRANEXAMIC ACID-NACL 1000-0.7 MG/100ML-% IV SOLN
1000.0000 mg | INTRAVENOUS | Status: AC
  Administered 2024-03-07: 1000 mg via INTRAVENOUS
  Filled 2024-03-07: qty 100

## 2024-03-07 MED ORDER — OXYCODONE HCL 5 MG/5ML PO SOLN: 5.0000 mg | Freq: Once | ORAL | Status: DC | PRN

## 2024-03-07 MED ORDER — FLEET ENEMA RE ENEM: 1.0000 | ENEMA | Freq: Once | RECTAL | Status: DC | PRN

## 2024-03-07 MED ORDER — LEVOTHYROXINE SODIUM 75 MCG PO TABS
150.0000 ug | ORAL_TABLET | Freq: Every day | ORAL | Status: DC
  Administered 2024-03-08 – 2024-03-09 (×2): 150 ug via ORAL
  Filled 2024-03-07 (×2): qty 2

## 2024-03-07 MED ORDER — ACETAMINOPHEN 10 MG/ML IV SOLN
1000.0000 mg | Freq: Four times a day (QID) | INTRAVENOUS | Status: DC
  Administered 2024-03-07: 1000 mg via INTRAVENOUS
  Filled 2024-03-07: qty 100

## 2024-03-07 MED ORDER — ACETAMINOPHEN 325 MG PO TABS: 325.0000 mg | ORAL_TABLET | Freq: Four times a day (QID) | ORAL | Status: DC | PRN

## 2024-03-07 MED ORDER — DOCUSATE SODIUM 100 MG PO CAPS
100.0000 mg | ORAL_CAPSULE | Freq: Two times a day (BID) | ORAL | Status: DC
  Administered 2024-03-07 – 2024-03-09 (×4): 100 mg via ORAL
  Filled 2024-03-07 (×4): qty 1

## 2024-03-07 MED ORDER — ATORVASTATIN CALCIUM 10 MG PO TABS
10.0000 mg | ORAL_TABLET | Freq: Every morning | ORAL | Status: DC
  Administered 2024-03-08 – 2024-03-09 (×2): 10 mg via ORAL
  Filled 2024-03-07 (×2): qty 1

## 2024-03-07 MED ORDER — POLYETHYLENE GLYCOL 3350 17 G PO PACK: 17.0000 g | PACK | Freq: Every day | ORAL | Status: DC | PRN

## 2024-03-07 MED ORDER — BISACODYL 10 MG RE SUPP: 10.0000 mg | Freq: Every day | RECTAL | Status: DC | PRN

## 2024-03-07 MED ORDER — ONDANSETRON HCL 4 MG PO TABS: 4.0000 mg | ORAL_TABLET | Freq: Four times a day (QID) | ORAL | Status: DC | PRN

## 2024-03-07 MED ORDER — MUPIROCIN 2 % EX OINT: 1.0000 | TOPICAL_OINTMENT | Freq: Two times a day (BID) | CUTANEOUS | 0 refills | Status: AC

## 2024-03-07 MED ORDER — BUPIVACAINE LIPOSOME 1.3 % IJ SUSP: 20.0000 mL | Freq: Once | INTRAMUSCULAR | Status: DC

## 2024-03-07 MED ORDER — LACTATED RINGERS IV SOLN: INTRAVENOUS | Status: DC

## 2024-03-07 MED ORDER — OXYCODONE HCL 5 MG PO TABS
10.0000 mg | ORAL_TABLET | ORAL | Status: DC | PRN
  Administered 2024-03-07 (×2): 10 mg via ORAL
  Administered 2024-03-08: 15 mg via ORAL
  Administered 2024-03-08: 10 mg via ORAL
  Administered 2024-03-09: 15 mg via ORAL
  Filled 2024-03-07 (×2): qty 2
  Filled 2024-03-07 (×2): qty 3

## 2024-03-07 MED ORDER — CEFAZOLIN SODIUM-DEXTROSE 2-4 GM/100ML-% IV SOLN
2.0000 g | Freq: Four times a day (QID) | INTRAVENOUS | Status: AC
  Administered 2024-03-07 – 2024-03-08 (×2): 2 g via INTRAVENOUS
  Filled 2024-03-07 (×2): qty 100

## 2024-03-07 MED ORDER — 0.9 % SODIUM CHLORIDE (POUR BTL) OPTIME
TOPICAL | Status: DC | PRN
  Administered 2024-03-07: 1000 mL

## 2024-03-07 MED ORDER — INSULIN ASPART 100 UNIT/ML IJ SOLN: 0.0000 [IU] | INTRAMUSCULAR | Status: DC | PRN

## 2024-03-07 MED ORDER — PANTOPRAZOLE SODIUM 40 MG PO TBEC
40.0000 mg | DELAYED_RELEASE_TABLET | Freq: Every day | ORAL | Status: DC
  Administered 2024-03-08 – 2024-03-09 (×2): 40 mg via ORAL
  Filled 2024-03-07 (×2): qty 1

## 2024-03-07 MED ORDER — PHENYLEPHRINE 80 MCG/ML (10ML) SYRINGE FOR IV PUSH (FOR BLOOD PRESSURE SUPPORT)
PREFILLED_SYRINGE | INTRAVENOUS | Status: DC | PRN
  Administered 2024-03-07: 160 ug via INTRAVENOUS
  Administered 2024-03-07: 80 ug via INTRAVENOUS
  Administered 2024-03-07 (×3): 160 ug via INTRAVENOUS
  Administered 2024-03-07: 80 ug via INTRAVENOUS

## 2024-03-07 MED ORDER — MENTHOL 3 MG MT LOZG: 1.0000 | LOZENGE | OROMUCOSAL | Status: DC | PRN

## 2024-03-07 MED ORDER — ACETAMINOPHEN 500 MG PO TABS
1000.0000 mg | ORAL_TABLET | Freq: Four times a day (QID) | ORAL | Status: AC
  Administered 2024-03-07 – 2024-03-08 (×4): 1000 mg via ORAL
  Filled 2024-03-07 (×4): qty 2

## 2024-03-07 MED ORDER — STERILE WATER FOR IRRIGATION IR SOLN
Status: DC | PRN
  Administered 2024-03-07: 1000 mL

## 2024-03-07 MED ORDER — DEXAMETHASONE SODIUM PHOSPHATE 10 MG/ML IJ SOLN
INTRAMUSCULAR | Status: AC
  Filled 2024-03-07: qty 1

## 2024-03-07 MED ORDER — MORPHINE SULFATE (PF) 2 MG/ML IV SOLN: 1.0000 mg | INTRAVENOUS | Status: DC | PRN

## 2024-03-07 MED ORDER — SODIUM CHLORIDE 0.9 % IR SOLN
Status: DC | PRN
  Administered 2024-03-07: 1000 mL

## 2024-03-07 MED ORDER — ORAL CARE MOUTH RINSE: 15.0000 mL | Freq: Once | OROMUCOSAL | Status: AC

## 2024-03-07 MED ORDER — SODIUM CHLORIDE (PF) 0.9 % IJ SOLN
INTRAMUSCULAR | Status: AC
  Filled 2024-03-07: qty 10

## 2024-03-07 MED ORDER — SODIUM CHLORIDE (PF) 0.9 % IJ SOLN
INTRAMUSCULAR | Status: DC | PRN
  Administered 2024-03-07: 80 mL

## 2024-03-07 MED ORDER — EPHEDRINE 5 MG/ML INJ
INTRAVENOUS | Status: AC
  Filled 2024-03-07: qty 5

## 2024-03-07 MED ORDER — SODIUM CHLORIDE 0.9 % IV SOLN: INTRAVENOUS | Status: DC

## 2024-03-07 MED ORDER — ASPIRIN 81 MG PO CHEW
81.0000 mg | CHEWABLE_TABLET | Freq: Two times a day (BID) | ORAL | Status: DC
  Administered 2024-03-07 – 2024-03-09 (×4): 81 mg via ORAL
  Filled 2024-03-07 (×4): qty 1

## 2024-03-07 MED ORDER — EPHEDRINE SULFATE-NACL 50-0.9 MG/10ML-% IV SOSY
PREFILLED_SYRINGE | INTRAVENOUS | Status: DC | PRN
  Administered 2024-03-07 (×4): 5 mg via INTRAVENOUS

## 2024-03-07 MED ORDER — ONDANSETRON HCL 4 MG/2ML IJ SOLN
INTRAMUSCULAR | Status: AC
  Filled 2024-03-07: qty 2

## 2024-03-07 MED ORDER — MIRABEGRON ER 25 MG PO TB24
50.0000 mg | ORAL_TABLET | Freq: Every day | ORAL | Status: DC
  Administered 2024-03-07 – 2024-03-09 (×3): 50 mg via ORAL
  Filled 2024-03-07 (×3): qty 2

## 2024-03-07 MED ORDER — PROPOFOL 500 MG/50ML IV EMUL
INTRAVENOUS | Status: DC | PRN
  Administered 2024-03-07 (×2): 20 mg via INTRAVENOUS
  Administered 2024-03-07: 100 ug/kg/min via INTRAVENOUS

## 2024-03-07 MED ORDER — OXYCODONE HCL 5 MG PO TABS
5.0000 mg | ORAL_TABLET | ORAL | Status: DC | PRN
  Administered 2024-03-09: 10 mg via ORAL
  Filled 2024-03-07 (×3): qty 2

## 2024-03-07 MED ORDER — DEXAMETHASONE SODIUM PHOSPHATE 10 MG/ML IJ SOLN
8.0000 mg | Freq: Once | INTRAMUSCULAR | Status: AC
  Administered 2024-03-07: 8 mg via INTRAVENOUS

## 2024-03-07 MED ORDER — SODIUM CHLORIDE (PF) 0.9 % IJ SOLN
INTRAMUSCULAR | Status: AC
  Filled 2024-03-07: qty 50

## 2024-03-07 MED ORDER — DIPHENHYDRAMINE HCL 12.5 MG/5ML PO ELIX
12.5000 mg | ORAL_SOLUTION | ORAL | Status: DC | PRN
  Administered 2024-03-08: 25 mg via ORAL
  Filled 2024-03-07: qty 10

## 2024-03-07 MED ORDER — ONDANSETRON HCL 4 MG/2ML IJ SOLN
INTRAMUSCULAR | Status: DC | PRN
  Administered 2024-03-07: 4 mg via INTRAVENOUS

## 2024-03-07 MED ORDER — CHLORHEXIDINE GLUCONATE 0.12 % MT SOLN
15.0000 mL | Freq: Once | OROMUCOSAL | Status: AC
  Administered 2024-03-07: 15 mL via OROMUCOSAL

## 2024-03-07 MED ORDER — CHLORHEXIDINE GLUCONATE 4 % EX SOLN: 1.0000 | CUTANEOUS | 1 refills | Status: AC

## 2024-03-07 SURGICAL SUPPLY — 45 items
ATTUNE MED DOME PAT 38 KNEE (Knees) IMPLANT
ATTUNE PSFEM LTSZ6 NARCEM KNEE (Femur) IMPLANT
ATTUNE PSRP INSR SZ6 7 KNEE (Insert) IMPLANT
BAG COUNTER SPONGE SURGICOUNT (BAG) IMPLANT
BAG ZIPLOCK 12X15 (MISCELLANEOUS) ×1 IMPLANT
BASE TIBIA ATTUNE KNEE SYS SZ6 (Knees) IMPLANT
BLADE SAG 18X100X1.27 (BLADE) ×1 IMPLANT
BLADE SAW SGTL 11.0X1.19X90.0M (BLADE) ×1 IMPLANT
BNDG ELASTIC 6INX 5YD STR LF (GAUZE/BANDAGES/DRESSINGS) ×1 IMPLANT
BOWL SMART MIX CTS (DISPOSABLE) ×1 IMPLANT
CEMENT HV SMART SET (Cement) ×2 IMPLANT
COVER SURGICAL LIGHT HANDLE (MISCELLANEOUS) ×1 IMPLANT
CUFF TRNQT CYL 34X4.125X (TOURNIQUET CUFF) ×1 IMPLANT
DERMABOND ADVANCED .7 DNX12 (GAUZE/BANDAGES/DRESSINGS) ×1 IMPLANT
DRAPE U-SHAPE 47X51 STRL (DRAPES) ×1 IMPLANT
DRSG AQUACEL AG ADV 3.5X10 (GAUZE/BANDAGES/DRESSINGS) ×1 IMPLANT
DURAPREP 26ML APPLICATOR (WOUND CARE) ×1 IMPLANT
ELECT REM PT RETURN 15FT ADLT (MISCELLANEOUS) ×1 IMPLANT
GLOVE BIO SURGEON STRL SZ 6.5 (GLOVE) IMPLANT
GLOVE BIO SURGEON STRL SZ7 (GLOVE) IMPLANT
GLOVE BIO SURGEON STRL SZ8 (GLOVE) ×1 IMPLANT
GLOVE BIOGEL PI IND STRL 7.0 (GLOVE) IMPLANT
GLOVE BIOGEL PI IND STRL 8 (GLOVE) ×1 IMPLANT
GOWN STRL REUS W/ TWL LRG LVL3 (GOWN DISPOSABLE) ×1 IMPLANT
HOLDER FOLEY CATH W/STRAP (MISCELLANEOUS) IMPLANT
IMMOBILIZER KNEE 20 (SOFTGOODS) ×1 IMPLANT
IMMOBILIZER KNEE 20 THIGH 36 (SOFTGOODS) ×1 IMPLANT
KIT TURNOVER KIT A (KITS) IMPLANT
MANIFOLD NEPTUNE II (INSTRUMENTS) ×1 IMPLANT
NS IRRIG 1000ML POUR BTL (IV SOLUTION) ×1 IMPLANT
PACK TOTAL KNEE CUSTOM (KITS) ×1 IMPLANT
PADDING CAST COTTON 6X4 STRL (CAST SUPPLIES) ×2 IMPLANT
PIN STEINMAN FIXATION KNEE (PIN) IMPLANT
PROTECTOR NERVE ULNAR (MISCELLANEOUS) ×1 IMPLANT
SET HNDPC FAN SPRY TIP SCT (DISPOSABLE) ×1 IMPLANT
SUT MNCRL AB 4-0 PS2 18 (SUTURE) ×1 IMPLANT
SUT STRATAFIX 0 PDS 27 VIOLET (SUTURE) ×1 IMPLANT
SUT VIC AB 2-0 CT1 TAPERPNT 27 (SUTURE) ×3 IMPLANT
SUTURE STRATFX 0 PDS 27 VIOLET (SUTURE) ×1 IMPLANT
TIBIA ATTUNE KNEE SYS BASE SZ6 (Knees) ×1 IMPLANT
TOWEL GREEN STERILE FF (TOWEL DISPOSABLE) ×1 IMPLANT
TRAY FOLEY MTR SLVR 16FR STAT (SET/KITS/TRAYS/PACK) IMPLANT
TUBE SUCTION HIGH CAP CLEAR NV (SUCTIONS) ×1 IMPLANT
WATER STERILE IRR 1000ML POUR (IV SOLUTION) ×2 IMPLANT
WRAP KNEE MAXI GEL POST OP (GAUZE/BANDAGES/DRESSINGS) ×1 IMPLANT

## 2024-03-07 NOTE — Progress Notes (Signed)
 Orthopedic Tech Progress Note Patient Details:  Kathryn Ryan September 23, 1941 [id number]  CPM Left Knee CPM Left Knee: Off Left Knee Flexion (Degrees): 40 Left Knee Extension (Degrees): 10  Post Interventions Patient Tolerated: Well  Darleen Crocker 03/07/2024, 6:56 PM

## 2024-03-07 NOTE — Anesthesia Procedure Notes (Signed)
 Spinal  Patient location during procedure: OR End time: 03/07/2024 12:39 PM Reason for block: surgical anesthesia Staffing Performed: resident/CRNA  Resident/CRNA: Enriqueta Shutter D, CRNA Performed by: Trashawn Oquendo, Clinical cytogeneticist D, CRNA Authorized by: Linton Rump, MD   Preanesthetic Checklist Completed: patient identified, IV checked, site marked, risks and benefits discussed, surgical consent, monitors and equipment checked, pre-op evaluation and timeout performed Spinal Block Patient position: sitting Prep: DuraPrep Patient monitoring: heart rate, continuous pulse ox and blood pressure Approach: midline Location: L3-4 Injection technique: single-shot Needle Needle type: Pencan  Needle gauge: 24 G Needle length: 9 cm Assessment Sensory level: T6 Events: CSF return

## 2024-03-07 NOTE — Anesthesia Procedure Notes (Addendum)
 Anesthesia Regional Block: Adductor canal block   Pre-Anesthetic Checklist: , timeout performed,  Correct Patient, Correct Site, Correct Laterality,  Correct Procedure, Correct Position, site marked,  Risks and benefits discussed,  Surgical consent,  Pre-op evaluation,  At surgeon's request and post-op pain management  Laterality: Left  Prep: chloraprep       Needles:  Injection technique: Single-shot  Needle Type: Echogenic Stimulator Needle     Needle Length: 9cm  Needle Gauge: 21     Additional Needles:   Procedures:,,,, ultrasound used (permanent image in chart),,    Narrative:  Start time: 03/07/2024 11:42 AM End time: 03/07/2024 11:45 AM Injection made incrementally with aspirations every 5 mL.  Performed by: Personally  Anesthesiologist: Linton Rump, MD  Additional Notes: Discussed risks and benefits of nerve block including, but not limited to, prolonged and/or permanent nerve injury involving sensory and/or motor function. Monitors were applied and a time-out was performed. The nerve and associated structures were visualized under ultrasound guidance. After negative aspiration, local anesthetic was slowly injected around the nerve. There was no evidence of high pressure during the procedure. There were no paresthesias. VSS remained stable and the patient tolerated the procedure well.

## 2024-03-07 NOTE — Interval H&P Note (Signed)
 History and Physical Interval Note:  03/07/2024 10:36 AM  Kathryn Ryan  has presented today for surgery, with the diagnosis of left knee osteoarthritis.  The various methods of treatment have been discussed with the patient and family. After consideration of risks, benefits and other options for treatment, the patient has consented to  Procedure(s): ARTHROPLASTY, KNEE, TOTAL (Left) as a surgical intervention.  The patient's history has been reviewed, patient examined, no change in status, stable for surgery.  I have reviewed the patient's chart and labs.  Questions were answered to the patient's satisfaction.     Homero Fellers Osric Klopf

## 2024-03-07 NOTE — Anesthesia Postprocedure Evaluation (Signed)
 Anesthesia Post Note  Patient: Kathryn Ryan  Procedure(s) Performed: ARTHROPLASTY, KNEE, TOTAL (Left: Knee)     Patient location during evaluation: PACU Anesthesia Type: MAC and Spinal Level of consciousness: awake Pain management: pain level controlled Vital Signs Assessment: post-procedure vital signs reviewed and stable Respiratory status: spontaneous breathing, respiratory function stable and nonlabored ventilation Cardiovascular status: blood pressure returned to baseline and stable Postop Assessment: no headache, no backache, no apparent nausea or vomiting and spinal receding Anesthetic complications: no   No notable events documented.  Last Vitals:  Vitals:   03/07/24 1430 03/07/24 1445  BP: 118/71 (!) 142/81  Pulse: 91 97  Resp: 19 17  Temp:    SpO2: 100% 91%    Last Pain:  Vitals:   03/07/24 1445  TempSrc:   PainSc: 0-No pain                 Linton Rump

## 2024-03-07 NOTE — Plan of Care (Signed)

## 2024-03-07 NOTE — Progress Notes (Signed)
 Orthopedic Tech Progress Note Patient Details:  Kathryn Ryan 10-11-1941 [id number]  CPM Left Knee CPM Left Knee: On Left Knee Flexion (Degrees): 40 Left Knee Extension (Degrees): 10  Post Interventions Patient Tolerated: Well  Darleen Crocker 03/07/2024, 2:42 PM

## 2024-03-07 NOTE — Care Plan (Signed)
 Ortho Bundle Case Management Note  Patient Details  Name: Kathryn Ryan MRN: [id number] Date of Birth: [birth year]  LT TKA 03/07/24  DCP: [facility] DME: No needs PT: [facility]   DME Arranged:  N/A DME Agency:  NA  HH Arranged:    HH Agency:     Additional Comments: Please contact me with any questions of if this plan should need to change.  Aida Raider, Case Manager [facility] [phone]   03/07/2024, 11:47 AM

## 2024-03-07 NOTE — Op Note (Signed)
 OPERATIVE REPORT-TOTAL KNEE ARTHROPLASTY   Pre-operative diagnosis- Osteoarthritis  Left knee(s)  Post-operative diagnosis- Osteoarthritis Left knee(s)  Procedure-  Left  Total Knee Arthroplasty  Surgeon- Kathryn Rankin. Meztli Llanas, MD  Assistant- Arcola Jansky, PA-C   Anesthesia-   Adductor canal block and spinal  EBL-20 mL   Drains None  Tourniquet time-  Total Tourniquet Time Documented: Thigh (Left) - 34 minutes Total: Thigh (Left) - 34 minutes     Complications- None  Condition-PACU - hemodynamically stable.   Brief Clinical Note  Kathryn Ryan is a 83 y.o. year old female with end stage OA of her left knee with progressively worsening pain and dysfunction. She has constant pain, with activity and at rest and significant functional deficits with difficulties even with ADLs. She has had extensive non-op management including analgesics, injections of cortisone and viscosupplements, and home exercise program, but remains in significant pain with significant dysfunction. Radiographs show bone on bone arthritis lateral and patellofemoral. She presents now for left Total Knee Arthroplasty.     Procedure in detail---   The patient is brought into the operating room and positioned supine on the operating table. After successful administration of  Adductor canal block and spinal,   a tourniquet is placed high on the  Left thigh(s) and the lower extremity is prepped and draped in the usual sterile fashion. Time out is performed by the operating team and then the  Left lower extremity is wrapped in Esmarch, knee flexed and the tourniquet inflated to 300 mmHg.       A midline incision is made with a ten blade through the subcutaneous tissue to the level of the extensor mechanism. A fresh blade is used to make a medial parapatellar arthrotomy. Soft tissue over the proximal medial tibia is subperiosteally elevated to the joint line with a knife and into the semimembranosus bursa with a Cobb  elevator. Soft tissue over the proximal lateral tibia is elevated with attention being paid to avoiding the patellar tendon on the tibial tubercle. The patella is everted, knee flexed 90 degrees and the ACL and PCL are removed. Findings are bone on bone lateral and patellofemoral with large global osteophytes        The drill is used to create a starting hole in the distal femur and the canal is thoroughly irrigated with sterile saline to remove the fatty contents. The 5 degree Left  valgus alignment guide is placed into the femoral canal and the distal femoral cutting block is pinned to remove 11 mm (due to flexion contracture) off the distal femur. Resection is made with an oscillating saw.      The tibia is subluxed forward and the menisci are removed. The extramedullary alignment guide is placed referencing proximally at the medial aspect of the tibial tubercle and distally along the second metatarsal axis and tibial crest. The block is pinned to remove 2mm off the more deficient lateral  side. Resection is made with an oscillating saw. Size 6is the most appropriate size for the tibia and the proximal tibia is prepared with the modular drill and keel punch for that size.      The femoral sizing guide is placed and size 6 is most appropriate. Rotation is marked off the epicondylar axis and confirmed by creating a rectangular flexion gap at 90 degrees. The size 6 cutting block is pinned in this rotation and the anterior, posterior and chamfer cuts are made with the oscillating saw. The intercondylar block is then placed  and that cut is made.      Trial size 6 tibial component, trial size 6 narrow posterior stabilized femur and a 7  mm posterior stabilized rotating platform insert trial is placed. Full extension is achieved with excellent varus/valgus and anterior/posterior balance throughout full range of motion. The patella is everted and thickness measured to be 22  mm. Free hand resection is taken to 12 mm,  a 38 template is placed, lug holes are drilled, trial patella is placed, and it tracks normally. Osteophytes are removed off the posterior femur with the trial in place. All trials are removed and the cut bone surfaces prepared with pulsatile lavage. Cement is mixed and once ready for implantation, the size 6 tibial implant, size  6 narrow posterior stabilized femoral component, and the size 38 patella are cemented in place and the patella is held with the clamp. The trial insert is placed and the knee held in full extension. The Exparel (20 ml mixed with 60 ml saline) is injected into the extensor mechanism, posterior capsule, medial and lateral gutters and subcutaneous tissues.  All extruded cement is removed and once the cement is hard the permanent 7 mm posterior stabilized rotating platform insert is placed into the tibial tray.      The wound is copiously irrigated with saline solution and the extensor mechanism closed with # 0 Stratofix suture. The tourniquet is released for a total tourniquet time of 34  minutes. Flexion against gravity is 140 degrees and the patella tracks normally. Subcutaneous tissue is closed with 2.0 vicryl and subcuticular with running 4.0 Monocryl. The incision is cleaned and dried and steri-strips and a bulky sterile dressing are applied. The limb is placed into a knee immobilizer and the patient is awakened and transported to recovery in stable condition.      Please note that a surgical assistant was a medical necessity for this procedure in order to perform it in a safe and expeditious manner. Surgical assistant was necessary to retract the ligaments and vital neurovascular structures to prevent injury to them and also necessary for proper positioning of the limb to allow for anatomic placement of the prosthesis.   Kathryn Rankin Kathryn Swigart, MD    03/07/2024, 1:42 PM

## 2024-03-07 NOTE — Discharge Instructions (Signed)
 Kathryn Gross, MD Total Joint Specialist [facility] [place] [address], [address] [place], [place] [address] [phone]  TOTAL KNEE REPLACEMENT POSTOPERATIVE DIRECTIONS    Knee Rehabilitation, Guidelines Following Surgery  Results after knee surgery are often greatly improved when you follow the exercise, range of motion and muscle strengthening exercises prescribed by your doctor. Safety measures are also important to protect the knee from further injury. If any of these exercises cause you to have increased pain or swelling in your knee joint, decrease the amount until you are comfortable again and slowly increase them. If you have problems or questions, call your caregiver or physical therapist for advice.   BLOOD CLOT PREVENTION Take 81 mg Aspirin two times a day for three weeks following surgery. Then take an 81 mg Aspirin once a day for three weeks. Then discontinue Aspirin. You may resume your vitamins/supplements upon discharge from the hospital. Do not take any NSAIDs (Advil, Aleve, Ibuprofen, Meloxicam, etc.) for 3 weeks, while taking 81mg  Aspirin twice a day.   HOME CARE INSTRUCTIONS  Remove items at home which could result in a fall. This includes throw rugs or furniture in walking pathways.  ICE to the affected knee as much as tolerated. Icing helps control swelling. If the swelling is well controlled you will be more comfortable and rehab easier. Continue to use ice on the knee for pain and swelling from surgery. You may notice swelling that will progress down to the foot and ankle. This is normal after surgery. Elevate the leg when you are not up walking on it.    Continue to use the breathing machine which will help keep your temperature down. It is common for your temperature to cycle up and down following surgery, especially at night when you are not up moving around and exerting yourself. The breathing machine keeps your lungs expanded and your temperature  down. Do not place pillow under the operative knee, focus on keeping the knee straight while resting  DIET You may resume your previous home diet once you are discharged from the hospital.  DRESSING / WOUND CARE / SHOWERING Keep your bulky bandage on for 2 days. On the third post-operative day you may remove the Ace bandage and gauze. There is a waterproof adhesive bandage on your skin which will stay in place until your first follow-up appointment. Once you remove this you will not need to place another bandage You may begin showering 3 days following surgery, but do not submerge the incision under water.  ACTIVITY For the first 5 days, the key is rest and control of pain and swelling Do your home exercises twice a day starting on post-operative day 3. On the days you go to physical therapy, just do the home exercises once that day. You should rest, ice and elevate the leg for 50 minutes out of every hour. Get up and walk/stretch for 10 minutes per hour. After 5 days you can increase your activity slowly as tolerated. Walk with your walker as instructed. Use the walker until you are comfortable transitioning to a cane. Walk with the cane in the opposite hand of the operative leg. You may discontinue the cane once you are comfortable and walking steadily. Avoid periods of inactivity such as sitting longer than an hour when not asleep. This helps prevent blood clots.  You may discontinue the knee immobilizer once you are able to perform a straight leg raise while lying down. You may resume a sexual relationship in  one month or when given the OK by your doctor.  You may return to work once you are cleared by your doctor.  Do not drive a car for 6 weeks or until released by your surgeon.  Do not drive while taking narcotics.  TED HOSE STOCKINGS Wear the elastic stockings on both legs for three weeks following surgery during the day. You may remove them at night for sleeping.  WEIGHT  BEARING Weight bearing as tolerated with assist device (walker, cane, etc) as directed, use it as long as suggested by your surgeon or therapist, typically at least 4-6 weeks.  POSTOPERATIVE CONSTIPATION PROTOCOL Constipation - defined medically as fewer than three stools per week and severe constipation as less than one stool per week.  One of the most common issues patients have following surgery is constipation.  Even if you have a regular bowel pattern at home, your normal regimen is likely to be disrupted due to multiple reasons following surgery.  Combination of anesthesia, postoperative narcotics, change in appetite and fluid intake all can affect your bowels.  In order to avoid complications following surgery, here are some recommendations in order to help you during your recovery period.  Colace (docusate) - Pick up an over-the-counter form of Colace or another stool softener and take twice a day as long as you are requiring postoperative pain medications.  Take with a full glass of water daily.  If you experience loose stools or diarrhea, hold the colace until you stool forms back up. If your symptoms do not get better within 1 week or if they get worse, check with your doctor. Dulcolax (bisacodyl) - Pick up over-the-counter and take as directed by the product packaging as needed to assist with the movement of your bowels.  Take with a full glass of water.  Use this product as needed if not relieved by Colace only.  MiraLax (polyethylene glycol) - Pick up over-the-counter to have on hand. MiraLax is a solution that will increase the amount of water in your bowels to assist with bowel movements.  Take as directed and can mix with a glass of water, juice, soda, coffee, or tea. Take if you go more than two days without a movement. Do not use MiraLax more than once per day. Call your doctor if you are still constipated or irregular after using this medication for 7 days in a row.  If you continue  to have problems with postoperative constipation, please contact the office for further assistance and recommendations.  If you experience "the worst abdominal pain ever" or develop nausea or vomiting, please contact the office immediatly for further recommendations for treatment.  ITCHING If you experience itching with your medications, try taking only a single pain pill, or even half a pain pill at a time.  You can also use Benadryl over the counter for itching or also to help with sleep.   MEDICATIONS See your medication summary on the "After Visit Summary" that the nursing staff will review with you prior to discharge.  You may have some home medications which will be placed on hold until you complete the course of blood thinner medication.  It is important for you to complete the blood thinner medication as prescribed by your surgeon.  Continue your approved medications as instructed at time of discharge.  PRECAUTIONS If you experience chest pain or shortness of breath - call 911 immediately for transfer to the hospital emergency department.  If you develop a fever greater that  101 F, purulent drainage from wound, increased redness or drainage from wound, foul odor from the wound/dressing, or calf pain - CONTACT YOUR SURGEON.                                                   FOLLOW-UP APPOINTMENTS Make sure you keep all of your appointments after your operation with your surgeon and caregivers. You should call the office at the above phone number and make an appointment for approximately two weeks after the date of your surgery or on the date instructed by your surgeon outlined in the "After Visit Summary".  RANGE OF MOTION AND STRENGTHENING EXERCISES  Rehabilitation of the knee is important following a knee injury or an operation. After just a few days of immobilization, the muscles of the thigh which control the knee become weakened and shrink (atrophy). Knee exercises are designed to build up  the tone and strength of the thigh muscles and to improve knee motion. Often times heat used for twenty to thirty minutes before working out will loosen up your tissues and help with improving the range of motion but do not use heat for the first two weeks following surgery. These exercises can be done on a training (exercise) mat, on the floor, on a table or on a bed. Use what ever works the best and is most comfortable for you Knee exercises include:  Leg Lifts - While your knee is still immobilized in a splint or cast, you can do straight leg raises. Lift the leg to 60 degrees, hold for 3 sec, and slowly lower the leg. Repeat 10-20 times 2-3 times daily. Perform this exercise against resistance later as your knee gets better.  Quad and Hamstring Sets - Tighten up the muscle on the front of the thigh (Quad) and hold for 5-10 sec. Repeat this 10-20 times hourly. Hamstring sets are done by pushing the foot backward against an object and holding for 5-10 sec. Repeat as with quad sets.  Leg Slides: Lying on your back, slowly slide your foot toward your buttocks, bending your knee up off the floor (only go as far as is comfortable). Then slowly slide your foot back down until your leg is flat on the floor again. Angel Wings: Lying on your back spread your legs to the side as far apart as you can without causing discomfort.  A rehabilitation program following serious knee injuries can speed recovery and prevent re-injury in the future due to weakened muscles. Contact your doctor or a physical therapist for more information on knee rehabilitation.   POST-OPERATIVE OPIOID TAPER INSTRUCTIONS: It is important to wean off of your opioid medication as soon as possible. If you do not need pain medication after your surgery it is ok to stop day one. Opioids include: Codeine, Hydrocodone(Norco, Vicodin), Oxycodone(Percocet, oxycontin) and hydromorphone amongst others.  Long term and even short term use of opiods can  cause: Increased pain response Dependence Constipation Depression Respiratory depression And more.  Withdrawal symptoms can include Flu like symptoms Nausea, vomiting And more Techniques to manage these symptoms Hydrate well Eat regular healthy meals Stay active Use relaxation techniques(deep breathing, meditating, yoga) Do Not substitute Alcohol to help with tapering If you have been on opioids for less than two weeks and do not have pain than it is ok to stop all together.  Plan to wean off of opioids This plan should start within one week post op of your joint replacement. Maintain the same interval or time between taking each dose and first decrease the dose.  Cut the total daily intake of opioids by one tablet each day Next start to increase the time between doses. The last dose that should be eliminated is the evening dose.   IF YOU ARE TRANSFERRED TO A SKILLED REHAB FACILITY If the patient is transferred to a skilled rehab facility following release from the hospital, a list of the current medications will be sent to the facility for the patient to continue.  When discharged from the skilled rehab facility, please have the facility set up the patient's Home Health Physical Therapy prior to being released. Also, the skilled facility will be responsible for providing the patient with their medications at time of release from the facility to include their pain medication, the muscle relaxants, and their blood thinner medication. If the patient is still at the rehab facility at time of the two week follow up appointment, the skilled rehab facility will also need to assist the patient in arranging follow up appointment in our office and any transportation needs.  MAKE SURE YOU:  Understand these instructions.  Get help right away if you are not doing well or get worse.   DENTAL ANTIBIOTICS:  In most cases prophylactic antibiotics for Dental procdeures after total joint surgery are  not necessary.  Exceptions are as follows:  1. History of prior total joint infection  2. Severely immunocompromised (Organ Transplant, cancer chemotherapy, Rheumatoid biologic meds such as Humera)  3. Poorly controlled diabetes (A1C &gt; 8.0, blood glucose over 200)  If you have one of these conditions, contact your surgeon for an antibiotic prescription, prior to your dental procedure.    Pick up stool softner and laxative for home use following surgery while on pain medications. Do not submerge incision under water. Please use good hand washing techniques while changing dressing each day. May shower starting three days after surgery. Please use a clean towel to pat the incision dry following showers. Continue to use ice for pain and swelling after surgery. Do not use any lotions or creams on the incision until instructed by your surgeon.

## 2024-03-07 NOTE — Transfer of Care (Signed)
 Immediate Anesthesia Transfer of Care Note  Patient: Kathryn Ryan  Procedure(s) Performed: ARTHROPLASTY, KNEE, TOTAL (Left: Knee)  Patient Location: PACU  Anesthesia Type:Regional and Spinal  Level of Consciousness: awake, alert , and oriented  Airway & Oxygen Therapy: Patient Spontanous Breathing and Patient connected to face mask oxygen  Post-op Assessment: Report given to RN and Post -op Vital signs reviewed and stable  Post vital signs: Reviewed and stable  Last Vitals:  Vitals Value Taken Time  BP 126/68 03/07/24 1403  Temp    Pulse 87 03/07/24 1403  Resp 16 03/07/24 1403  SpO2 100 % 03/07/24 1403  Vitals shown include unfiled device data.  Last Pain:  Vitals:   03/07/24 1115  TempSrc:   PainSc: 0-No pain         Complications: No notable events documented.

## 2024-03-08 ENCOUNTER — Encounter (HOSPITAL_COMMUNITY): Payer: Self-pay | Admitting: Orthopedic Surgery

## 2024-03-08 DIAGNOSIS — Z79899 Other long term (current) drug therapy: Secondary | ICD-10-CM | POA: Diagnosis not present

## 2024-03-08 DIAGNOSIS — M1712 Unilateral primary osteoarthritis, left knee: Secondary | ICD-10-CM | POA: Diagnosis not present

## 2024-03-08 DIAGNOSIS — E119 Type 2 diabetes mellitus without complications: Secondary | ICD-10-CM | POA: Diagnosis not present

## 2024-03-08 DIAGNOSIS — I10 Essential (primary) hypertension: Secondary | ICD-10-CM | POA: Diagnosis not present

## 2024-03-08 DIAGNOSIS — E039 Hypothyroidism, unspecified: Secondary | ICD-10-CM | POA: Diagnosis not present

## 2024-03-08 DIAGNOSIS — Z7984 Long term (current) use of oral hypoglycemic drugs: Secondary | ICD-10-CM | POA: Diagnosis not present

## 2024-03-08 LAB — GLUCOSE, CAPILLARY
Glucose-Capillary: 200 mg/dL — ABNORMAL HIGH (ref 70–99)
Glucose-Capillary: 213 mg/dL — ABNORMAL HIGH (ref 70–99)
Glucose-Capillary: 228 mg/dL — ABNORMAL HIGH (ref 70–99)
Glucose-Capillary: 239 mg/dL — ABNORMAL HIGH (ref 70–99)
Glucose-Capillary: 243 mg/dL — ABNORMAL HIGH (ref 70–99)
Glucose-Capillary: 245 mg/dL — ABNORMAL HIGH (ref 70–99)

## 2024-03-08 LAB — BASIC METABOLIC PANEL WITH GFR
Anion gap: 9 (ref 5–15)
BUN: 12 mg/dL (ref 8–23)
CO2: 25 mmol/L (ref 22–32)
Calcium: 8.5 mg/dL — ABNORMAL LOW (ref 8.9–10.3)
Chloride: 99 mmol/L (ref 98–111)
Creatinine, Ser: 0.84 mg/dL (ref 0.44–1.00)
GFR, Estimated: >60 mL/min (ref >60)
Glucose, Bld: 250 mg/dL — ABNORMAL HIGH (ref 70–99)
Potassium: 4.5 mmol/L (ref 3.5–5.1)
Sodium: 133 mmol/L — ABNORMAL LOW (ref 135–145)

## 2024-03-08 LAB — CBC
HCT: 32.1 % — ABNORMAL LOW (ref 36.0–46.0)
Hemoglobin: 10 g/dL — ABNORMAL LOW (ref 12.0–15.0)
MCH: 27.9 pg (ref 26.0–34.0)
MCHC: 31.2 g/dL (ref 30.0–36.0)
MCV: 89.4 fL (ref 80.0–100.0)
Platelets: 157 10*3/uL (ref 150–400)
RBC: 3.59 MIL/uL — ABNORMAL LOW (ref 3.87–5.11)
RDW: 15.1 % (ref 11.5–15.5)
WBC: 11 10*3/uL — ABNORMAL HIGH (ref 4.0–10.5)
nRBC: 0 % (ref 0.0–0.2)

## 2024-03-08 MED ORDER — ASPIRIN 81 MG PO CHEW: 81.0000 mg | CHEWABLE_TABLET | Freq: Two times a day (BID) | ORAL | 0 refills | Status: AC

## 2024-03-08 MED ORDER — CHLORHEXIDINE GLUCONATE CLOTH 2 % EX PADS
6.0000 | MEDICATED_PAD | Freq: Every day | CUTANEOUS | Status: DC
  Administered 2024-03-08 – 2024-03-09 (×2): 6 via TOPICAL

## 2024-03-08 MED ORDER — METHOCARBAMOL 500 MG PO TABS: 500.0000 mg | ORAL_TABLET | Freq: Four times a day (QID) | ORAL | 0 refills | Status: AC | PRN

## 2024-03-08 MED ORDER — OXYCODONE HCL 5 MG PO TABS: 5.0000 mg | ORAL_TABLET | Freq: Four times a day (QID) | ORAL | 0 refills | Status: AC | PRN

## 2024-03-08 MED ORDER — ONDANSETRON HCL 4 MG PO TABS: 4.0000 mg | ORAL_TABLET | Freq: Four times a day (QID) | ORAL | 0 refills | Status: AC | PRN

## 2024-03-08 MED ORDER — TRAMADOL HCL 50 MG PO TABS
50.0000 mg | ORAL_TABLET | Freq: Four times a day (QID) | ORAL | 0 refills | Status: AC | PRN
Start: 2024-03-08 — End: ?

## 2024-03-08 NOTE — Evaluation (Signed)
 Physical Therapy Evaluation Patient Details Name: Kathryn Ryan MRN: [id number] DOB: 25-Jul-1941 Today's Date: 03/08/2024  History of Present Illness  83 yo female s/p L TKA on 03/07/24. PMH: orthostatic hypotension, MDD, renal mass, adrenal mass, DM, HTN, morbid obesity, HTN  Clinical Impression  Pt is s/p TKA resulting in the deficits listed below (see PT Problem List).  Pt is motivated to work with PT, reports incr pain level but willing to mobilize within limits. Amb 63' with RW and min assist, chair follow for safety. Plan is for SNF post acute   Pt will benefit from acute skilled PT to increase their independence and safety with mobility to allow discharge.          If plan is discharge home, recommend the following: A little help with walking and/or transfers;A little help with bathing/dressing/bathroom;Help with stairs or ramp for entrance;Assist for transportation   Can travel by private vehicle   No    Equipment Recommendations None recommended by PT  Recommendations for Other Services       Functional Status Assessment Patient has had a recent decline in their functional status and demonstrates the ability to make significant improvements in function in a reasonable and predictable amount of time.     Precautions / Restrictions Precautions Precautions: Fall;Knee Precaution/Restrictions Comments: pt with LLE positioned in hip external rotation and knee flexion; educated on proper position/terminal knee extension; KI placed by PT Required Braces or Orthoses: Knee Immobilizer - Left Knee Immobilizer - Left: Discontinue once straight leg raise with < 10 degree lag Restrictions Weight Bearing Restrictions Per Provider Order: No Other Position/Activity Restrictions: WBAT      Mobility  Bed Mobility Overal bed mobility: Needs Assistance Bed Mobility: Supine to Sit     Supine to sit: Mod assist, Used rails, HOB elevated     General bed mobility comments: assist with  LLE and trunk, incr time, use of bed features    Transfers Overall transfer level: Needs assistance Equipment used: Rolling walker (2 wheels) Transfers: Sit to/from Stand Sit to Stand: Min assist, Mod assist, +2 safety/equipment           General transfer comment: assist  to rise and transition to stand; multi-modal cues for proper hand placement, LE Position    Ambulation/Gait Ambulation/Gait assistance: Min assist Gait Distance (Feet): 40 Feet Assistive device: Rolling walker (2 wheels) Gait Pattern/deviations: Step-to pattern, Decreased stance time - left       General Gait Details: cues for sequence and RW position; chair follow for safety  Stairs            Wheelchair Mobility     Tilt Bed    Modified Rankin (Stroke Patients Only)       Balance                                             Pertinent Vitals/Pain Pain Assessment Pain Assessment: Faces Faces Pain Scale: Hurts even more Pain Location: L knee Pain Descriptors / Indicators: Sore, Discomfort, Grimacing Pain Intervention(s): Limited activity within patient's tolerance, Monitored during session, Premedicated before session, Repositioned    Home Living Family/patient expects to be discharged to:: Skilled nursing facility Living Arrangements: Alone   Type of Home: Apartment         Home Layout: One level Home Equipment: Rollator (4 wheels);Cane - single point Additional Comments: pt  from ALF    Prior Function Prior Level of Function : Needs assist       Physical Assist : ADLs (physical)   ADLs (physical): Bathing;IADLs Mobility Comments: most recent fall one month ago. amb with rollator       Extremity/Trunk Assessment   Upper Extremity Assessment Upper Extremity Assessment: Overall WFL for tasks assessed    Lower Extremity Assessment Lower Extremity Assessment: LLE deficits/detail LLE Deficits / Details: ankle ROM ltd at baseline; unable to test  knee/hip d/t pain; assists with SLR, knee maintained on ~ 30 degrees flexion       Communication   Communication Communication: Impaired Factors Affecting Communication: Hearing impaired    Cognition Arousal: Alert Behavior During Therapy: WFL for tasks assessed/performed   PT - Cognitive impairments: No apparent impairments                         Following commands: Intact       Cueing Cueing Techniques: Verbal cues, Tactile cues, Gestural cues     General Comments      Exercises Total Joint Exercises Ankle Circles/Pumps: AROM, Right, 5 reps Quad Sets: AROM, Both, 5 reps   Assessment/Plan    PT Assessment Patient needs continued PT services  PT Problem List Decreased strength;Decreased range of motion;Decreased activity tolerance;Decreased balance;Pain;Decreased mobility       PT Treatment Interventions DME instruction;Gait training;Functional mobility training;Therapeutic activities;Therapeutic exercise;Patient/family education    PT Goals (Current goals can be found in the Care Plan section)  Acute Rehab PT Goals PT Goal Formulation: With patient Time For Goal Achievement: 03/22/24 Potential to Achieve Goals: Good    Frequency 7X/week     Co-evaluation               AM-PAC PT "6 Clicks" Mobility  Outcome Measure Help needed turning from your back to your side while in a flat bed without using bedrails?: A Little Help needed moving from lying on your back to sitting on the side of a flat bed without using bedrails?: A Lot Help needed moving to and from a bed to a chair (including a wheelchair)?: A Lot Help needed standing up from a chair using your arms (e.g., wheelchair or bedside chair)?: A Lot Help needed to walk in hospital room?: A Lot Help needed climbing 3-5 steps with a railing? : Total 6 Click Score: 12    End of Session Equipment Utilized During Treatment: Gait belt Activity Tolerance: Patient tolerated treatment  well Patient left: in chair;with call bell/phone within reach;with chair alarm set Nurse Communication: Mobility status PT Visit Diagnosis: Other abnormalities of gait and mobility (R26.89)    Time: 1610-9604 PT Time Calculation (min) (ACUTE ONLY): 24 min   Charges:   PT Evaluation $PT Eval Low Complexity: 1 Low PT Treatments $Gait Training: 8-22 mins PT General Charges $$ ACUTE PT VISIT: 1 Visit         Kathryn Ryan, PT  Acute Rehab Dept [facility]) [phone]  03/08/2024   [facility] 03/08/2024, 11:37 AM

## 2024-03-08 NOTE — Care Management Obs Status (Signed)
 MEDICARE OBSERVATION STATUS NOTIFICATION   Patient Details  Name: Kathryn Ryan MRN: [id number] Date of Birth: September 05, 1941   Medicare Observation Status Notification Given:  Hart Robinsons, LCSW 03/08/2024, 1:17 PM

## 2024-03-08 NOTE — NC FL2 (Signed)
 [place] MEDICAID FL2 LEVEL OF CARE FORM     IDENTIFICATION  Patient Name: Kathryn Ryan Birthdate: December 20, 1940 Sex: female Admission Date (Current Location): 03/07/2024  [facility] and [place] Number:  Producer, television/film/video and Address:  [facility],  [address] [place], [place] [address]      Provider Number: [id number]  Attending Physician Name and Address:  Ollen Gross, MD  Relative Name and Phone Number:  son, Valeda Malm @ [phone]    Current Level of Care: Hospital Recommended Level of Care: Skilled Nursing Facility Prior Approval Number:    Date Approved/Denied:   PASRR Number: [id number]  Discharge Plan: SNF    Current Diagnoses: Patient Active Problem List   Diagnosis Date Noted   Primary osteoarthritis of left knee 03/07/2024   Orthostatic hypotension 11/13/2021   Moderate major depression (HCC) 11/22/2020   Left renal mass 11/22/2020   Left adrenal mass (HCC) 11/22/2020   Chronic pain of right knee 02/23/2019   OA (osteoarthritis) of knee 02/23/2019   Diabetes (HCC) 08/04/2017   Mixed hyperlipidemia 06/15/2014   Cellulitis of left ankle 04/25/2014   Septic arthritis (HCC) 04/25/2014   Acute osteomyelitis (HCC) 04/25/2014   Hyperglycemia 04/25/2014   HTN (hypertension) 04/25/2014   Infected hardware in left leg (HCC) 04/25/2014   Anemia 10/20/2013   GERD (gastroesophageal reflux disease) 10/19/2013   Chronic kidney disease, unspecified 12/09/1999   Obstructive sleep apnea 12/09/1999   Morbid obesity due to excess calories (HCC) 12/09/1999   Hypothyroidism 12/09/1999   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   Dysmetabolic syndrome X 12/09/1999    Orientation RESPIRATION BLADDER Height & Weight     Self, Time, Situation, Place  Normal Continent Weight: 226 lb (102.5 kg) Height:  5\' 5"  (165.1 cm)  BEHAVIORAL SYMPTOMS/MOOD NEUROLOGICAL BOWEL NUTRITION STATUS      Continent Diet (regular)  AMBULATORY STATUS COMMUNICATION OF  NEEDS Skin   Limited Assist Verbally Other (Comment) (surgical incision only)                       Personal Care Assistance Level of Assistance  Bathing, Dressing Bathing Assistance: Limited assistance   Dressing Assistance: Limited assistance     Functional Limitations Info  Sight, Hearing, Speech Sight Info: Adequate Hearing Info: Adequate Speech Info: Adequate    SPECIAL CARE FACTORS FREQUENCY  PT (By licensed PT), OT (By licensed OT)     PT Frequency: 5x/wk OT Frequency: 5x/wk            Contractures Contractures Info: Not present    Additional Factors Info  Code Status, Allergies Code Status Info: Full Allergies Info: sulfa antibiotics           Current Medications (03/08/2024):  This is the current hospital active medication list Current Facility-Administered Medications  Medication Dose Route Frequency Provider Last Rate Last Admin   0.9 %  sodium chloride infusion   Intravenous Continuous Cory Munch, PA-C 75 mL/hr at 03/07/24 1827 Infusion Verify at 03/07/24 1827   acetaminophen (TYLENOL) tablet 325-650 mg  325-650 mg Oral Q6H PRN Cory Munch, PA-C       aspirin chewable tablet 81 mg  81 mg Oral BID Cory Munch, PA-C   81 mg at 03/08/24 0919   atorvastatin (LIPITOR) tablet 10 mg  10 mg Oral q morning Cory Munch, PA-C   10 mg at 03/08/24 0920   bisacodyl (DULCOLAX) suppository 10 mg  10 mg Rectal Daily PRN  Cory Munch, PA-C       Chlorhexidine Gluconate Cloth 2 % PADS 6 each  6 each Topical Daily Aluisio, Homero Fellers, MD       diphenhydrAMINE (BENADRYL) 12.5 MG/5ML elixir 12.5-25 mg  12.5-25 mg Oral Q4H PRN Cory Munch, PA-C       docusate sodium (COLACE) capsule 100 mg  100 mg Oral BID Cory Munch, PA-C   100 mg at 03/08/24 0920   DULoxetine (CYMBALTA) DR capsule 60 mg  60 mg Oral BID Cory Munch, PA-C   60 mg at 03/08/24 0920   insulin aspart (novoLOG)  injection 0-15 Units  0-15 Units Subcutaneous TID WC Cory Munch, PA-C   5 Units at 03/08/24 1218   insulin glargine-yfgn [facility]) injection 120 Units  120 Units Subcutaneous QHS Cory Munch, [place]   120 Units at 03/07/24 2239   levothyroxine (SYNTHROID) tablet 150 mcg  150 mcg Oral QAC breakfast Weston Brass [place], [place]   150 mcg at 03/08/24 6213   menthol-cetylpyridinium (CEPACOL) lozenge 3 mg  1 lozenge Oral PRN Cory Munch, PA-C       Or   phenol (CHLORASEPTIC) mouth spray 1 spray  1 spray Mouth/Throat PRN Cory Munch, PA-C       methocarbamol (ROBAXIN) tablet 500 mg  500 mg Oral Q6H PRN Cory Munch, PA-C   500 mg at 03/08/24 1149   Or   methocarbamol (ROBAXIN) injection 500 mg  500 mg Intravenous Q6H PRN Cory Munch, PA-C       metoCLOPramide (REGLAN) tablet 5-10 mg  5-10 mg Oral Q8H PRN Cory Munch, PA-C       Or   metoCLOPramide (REGLAN) injection 5-10 mg  5-10 mg Intravenous Q8H PRN Cory Munch, PA-C       mirabegron ER [facility]) tablet 50 mg  50 mg Oral Daily Weston Brass [place], PA-C   50 mg at 03/08/24 0920   morphine (PF) 2 MG/ML injection 1-2 mg  1-2 mg Intravenous Q2H PRN Cory Munch, PA-C       ondansetron [facility]) tablet 4 mg  4 mg Oral Q6H PRN Cory Munch, PA-C       Or   ondansetron [facility]) injection 4 mg  4 mg Intravenous Q6H PRN Cory Munch, PA-C       oxyCODONE (Oxy IR/ROXICODONE) immediate release tablet 10-15 mg  10-15 mg Oral Q4H PRN Cory Munch, PA-C   10 mg at 03/08/24 1032   oxyCODONE (Oxy IR/ROXICODONE) immediate release tablet 5-10 mg  5-10 mg Oral Q4H PRN Cory Munch, PA-C       pantoprazole (PROTONIX) EC tablet 40 mg  40 mg Oral Daily Weston Brass [place], PA-C   40 mg at 03/08/24 0920   polyethylene glycol (MIRALAX / GLYCOLAX) packet 17 g  17 g Oral Daily PRN Cory Munch, PA-C       sodium phosphate (FLEET) enema 1 enema  1 enema Rectal Once PRN Cory Munch, PA-C       traMADol Janean Sark) tablet 50-100 mg  50-100 mg Oral Q6H PRN Cory Munch, PA-C   100 mg at 03/07/24 2052     Discharge Medications: Please see discharge summary for a list of discharge medications.  Relevant Imaging Results:  Relevant Lab Results:   Additional Information SS# [id number]  [name], LCSW

## 2024-03-08 NOTE — TOC Initial Note (Signed)
 Transition of Care [facility]) - Initial/Assessment Note    Patient Details  Name: Kathryn Ryan MRN: [id number] Date of Birth: [birth year]  [facility]) CM/SW Contact:    [name], LCSW Phone Number: 03/08/2024, 1:42 PM  Clinical Narrative:                  Met with pt today to review dc planning needs.  Pt confirms that she is admitted to [facility] from her AL apt at [facility] in [place], however, notes she had made arrangements to dc to [facility] SNF for rehab prior to return to ALF.  Have been able to confirm with Admission contact at [facility] that they have accepted pt for admission when she is medically cleared for hospital dc.  Per ortho PA, should be ready for dc tomorrow.  Will send needed documentation to SNF.   Barriers to Discharge: Continued Medical Work up   Patient Goals and CMS Choice Patient states their goals for this hospitalization and ongoing recovery are:: return home          Expected Discharge Plan and Services In-house Referral: Clinical Social Work   Post Acute Care Choice: Skilled Nursing Facility Living arrangements for the past 2 months: Assisted Living Facility Expected Discharge Date: 03/08/24               DME Arranged: N/A DME Agency: NA                  Prior Living Arrangements/Services Living arrangements for the past 2 months: Assisted Living Facility Lives with:: Facility Resident Patient language and need for interpreter reviewed:: Yes Do you feel safe going back to the place where you live?: Yes      Need for Family Participation in Patient Care: No (Comment) Care giver support system in place?: Yes (comment)   Criminal Activity/Legal Involvement Pertinent to Current Situation/Hospitalization: No - Comment as needed  Activities of Daily Living   ADL Screening (condition at time of admission) Independently performs ADLs?: Yes (appropriate for developmental age) Is the patient deaf or have difficulty hearing?:  No Does the patient have difficulty seeing, even when wearing glasses/contacts?: No Does the patient have difficulty concentrating, remembering, or making decisions?: No  Permission Sought/Granted Permission sought to share information with : Facility Medical sales representative, Family Supports Permission granted to share information with : Yes, Verbal Permission Granted  Share Information with NAME: son, Valeda Malm @ [phone]  Permission granted to share info w AGENCY: SNF        Emotional Assessment Appearance:: Appears stated age Attitude/Demeanor/Rapport: Gracious, Engaged Affect (typically observed): Accepting Orientation: : Oriented to Self, Oriented to Place, Oriented to  Time, Oriented to Situation Alcohol / Substance Use: Not Applicable Psych Involvement: No (comment)  Admission diagnosis:  Primary osteoarthritis of left knee [M17.12] Patient Active Problem List   Diagnosis Date Noted   Primary osteoarthritis of left knee 03/07/2024   Orthostatic hypotension 11/13/2021   Moderate major depression (HCC) 11/22/2020   Left renal mass 11/22/2020   Left adrenal mass (HCC) 11/22/2020   Chronic pain of right knee 02/23/2019   OA (osteoarthritis) of knee 02/23/2019   Diabetes (HCC) 08/04/2017   Mixed hyperlipidemia 06/15/2014   Cellulitis of left ankle 04/25/2014   Septic arthritis (HCC) 04/25/2014   Acute osteomyelitis (HCC) 04/25/2014   Hyperglycemia 04/25/2014   HTN (hypertension) 04/25/2014   Infected hardware in left leg (HCC) 04/25/2014   Anemia 10/20/2013   GERD (gastroesophageal reflux disease) 10/19/2013  Chronic kidney disease, unspecified 12/09/1999   Obstructive sleep apnea 12/09/1999   Morbid obesity due to excess calories (HCC) 12/09/1999   Hypothyroidism 12/09/1999   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 - [place], [place] - [address] [place] [address] Phone: [phone] Fax: [phone]  [facility] - [place], [place] - [address] [place] [address] Phone: [phone] Fax: [phone]  [facility] - [place], [place] - [address] [place] [address] Phone: [phone] Fax: [phone]  [facility] - [place], [place] - [address] [place] [address] Phone: [phone] Fax: [phone]     Social Drivers of Health (SDOH) Social History: SDOH Screenings   Food Insecurity: No Food Insecurity (03/07/2024)  Housing: Unknown (03/07/2024)  Transportation Needs: No Transportation Needs (03/07/2024)  Utilities: Not At Risk (03/07/2024)  Financial Resource Strain: Low Risk  (11/05/2021)   Received from [facility], [facility]  Social Connections: Unknown (03/07/2024)  Stress: No Stress Concern Present (11/03/2021)   Received from [facility], [facility]  Tobacco Use: Medium Risk (03/07/2024)  Health Literacy: Medium Risk (11/23/2020)   Received from [facility], [facility]   SDOH Interventions:     Readmission Risk Interventions     No data to display

## 2024-03-08 NOTE — Progress Notes (Signed)
 Physical Therapy Treatment Patient Details Name: Kathryn Ryan MRN: [id number] DOB: December 26, 1940 Today's Date: 03/08/2024   History of Present Illness 83 yo female s/p L TKA on 03/07/24. PMH: orthostatic hypotension, MDD, renal mass, adrenal mass, DM, HTN, morbid obesity, HTN    PT Comments  Progressing toward PT goals; amb 25' x2 with min assist and participated in TKA ex's after retunring to bed; pt  noted to have  abrasions anterior L shin, distal area with small amout of bleeding; mepilex placed over anterior tibia/bony prominence to protect against shearing when KI in place; pt returned to [facility] in bed to incr L knee extension (pt tends to position in knee flexion and hip ER)   Plan is for SNF; continue PT in acute setting   If plan is discharge home, recommend the following: A little help with walking and/or transfers;A little help with bathing/dressing/bathroom;Help with stairs or ramp for entrance;Assist for transportation   Can travel by private vehicle     No  Equipment Recommendations  None recommended by PT    Recommendations for Other Services       Precautions / Restrictions Precautions Precautions: Fall;Knee Recall of Precautions/Restrictions: Impaired Precaution/Restrictions Comments: reviewed importance of terminal knee extension Required Braces or Orthoses: Knee Immobilizer - Left Knee Immobilizer - Left: Discontinue once straight leg raise with < 10 degree lag Restrictions Other Position/Activity Restrictions: WBAT     Mobility  Bed Mobility Overal bed mobility: Needs Assistance Bed Mobility: Sit to Supine     Supine to sit: Mod assist, Used rails, HOB elevated Sit to supine: Min assist   General bed mobility comments: assist with LLE    Transfers Overall transfer level: Needs assistance Equipment used: Rolling walker (2 wheels) Transfers: Sit to/from Stand, Bed to chair/wheelchair/BSC Sit to Stand: Min assist   Step pivot transfers: Min assist        General transfer comment: STS x3;assist  to rise and transition to stand; multi-modal cues for proper hand placement, LE Position    Ambulation/Gait Ambulation/Gait assistance: Contact guard assist, Min assist Gait Distance (Feet): 25 Feet (x2) Assistive device: Rolling walker (2 wheels) Gait Pattern/deviations: Step-to pattern, Decreased stance time - left       General Gait Details: positions bil LEs in external rotation; cues for sequence and RW position; chair follow for safety   Stairs             Wheelchair Mobility     Tilt Bed    Modified Rankin (Stroke Patients Only)       Balance                                            Communication Communication Communication: Impaired Factors Affecting Communication: Hearing impaired  Cognition Arousal: Alert Behavior During Therapy: WFL for tasks assessed/performed   PT - Cognitive impairments: No apparent impairments                         Following commands: Intact      Cueing Cueing Techniques: Verbal cues, Tactile cues, Gestural cues  Exercises Total Joint Exercises Ankle Circles/Pumps: AROM, Right, 5 reps Quad Sets: AROM, Both, 5 reps Heel Slides: AAROM, Left, 10 reps Straight Leg Raises: AAROM, Left, 5 reps, Limitations Straight Leg Raises Limitations: 12 degree quad lag    General Comments General comments (skin integrity,  edema, etc.): noted abrasions anterior L shin, distal area with small amout of bleeding; mepilex placed over anterior tibia/bony prominence to protect against shearing when KI in place; pt returned to [facility] in bed to incr L knee extension (pt tends to position in knee flexion and hip ER)      Pertinent Vitals/Pain Pain Assessment Pain Assessment: Faces Faces Pain Scale: Hurts even more Pain Location: L knee Pain Descriptors / Indicators: Sore, Discomfort, Grimacing Pain Intervention(s): Limited activity within patient's tolerance, Monitored during  session, Premedicated before session, Repositioned    Home Living Family/patient expects to be discharged to:: Skilled nursing facility Living Arrangements: Alone                 Additional Comments: pt from ALF    Prior Function            PT Goals (current goals can now be found in the care plan section) Acute Rehab PT Goals PT Goal Formulation: With patient Time For Goal Achievement: 03/22/24 Potential to Achieve Goals: Good Progress towards PT goals: Progressing toward goals    Frequency    7X/week      PT Plan      Co-evaluation              AM-PAC PT "6 Clicks" Mobility   Outcome Measure  Help needed turning from your back to your side while in a flat bed without using bedrails?: A Little Help needed moving from lying on your back to sitting on the side of a flat bed without using bedrails?: A Little Help needed moving to and from a bed to a chair (including a wheelchair)?: A Lot Help needed standing up from a chair using your arms (e.g., wheelchair or bedside chair)?: A Lot Help needed to walk in hospital room?: A Little Help needed climbing 3-5 steps with a railing? : Total 6 Click Score: 14    End of Session Equipment Utilized During Treatment: Gait belt Activity Tolerance: Patient tolerated treatment well Patient left: in bed;with call bell/phone within reach;with bed alarm set Nurse Communication: Mobility status PT Visit Diagnosis: Other abnormalities of gait and mobility (R26.89)     Time: 1610-9604 PT Time Calculation (min) (ACUTE ONLY): 28 min  Charges:    $Gait Training: 8-22 mins $Therapeutic Exercise: 8-22 mins PT General Charges $$ ACUTE PT VISIT: 1 Visit                     Delice Bison, PT  Acute Rehab Dept [facility]) [phone]  03/08/2024    [facility] 03/08/2024, 2:34 PM

## 2024-03-08 NOTE — Progress Notes (Signed)
   Subjective: 1 Day Post-Op Procedure(s) (LRB): ARTHROPLASTY, KNEE, TOTAL (Left) Patient reports pain as mild.   Patient seen in rounds by Dr. Lequita Halt. Patient is well, and has had no acute complaints or problems. No acute events overnight. Denies chest pain or SOB. We will begin therapy today.  Objective: Vital signs in last 24 hours: Temp:  [97.7 F (36.5 C)-98.8 F (37.1 C)] 98.4 F (36.9 C) (04/01 0610) Pulse Rate:  [90-105] 92 (04/01 0610) Resp:  [14-19] 18 (04/01 0610) BP: (105-157)/(54-81) 105/72 (04/01 0610) SpO2:  [71 %-100 %] 100 % (04/01 0610) Weight:  [102.5 kg] 102.5 kg (03/31 1620)  Intake/Output from previous day:  Intake/Output Summary (Last 24 hours) at 03/08/2024 0817 Last data filed at 03/08/2024 0600 Gross per 24 hour  Intake 3201.25 ml  Output 1270 ml  Net 1931.25 ml     Intake/Output this shift: No intake/output data recorded.  Labs: Recent Labs    03/08/24 0349  HGB 10.0*   Recent Labs    03/08/24 0349  WBC 11.0*  RBC 3.59*  HCT 32.1*  PLT 157   Recent Labs    03/08/24 0349  NA 133*  K 4.5  CL 99  CO2 25  BUN 12  CREATININE 0.84  GLUCOSE 250*  CALCIUM 8.5*   No results for input(s): "LABPT", "INR" in the last 72 hours.  Exam: General - Patient is Alert and Oriented Extremity - Neurologically intact Neurovascular intact Sensation intact distally Dorsiflexion/Plantar flexion intact Dressing - dressing C/D/I Motor Function - intact, moving foot and toes well on exam.   Past Medical History:  Diagnosis Date   Anxiety    Diabetes (HCC) 08/04/2017   Diabetes mellitus without complication (HCC)    new onset diabetic    GERD (gastroesophageal reflux disease)    H/O hiatal hernia    Hypertension    Hypothyroidism    Lumbar radiculopathy    Neuropathy    non related to DM-not sure why   Neuropathy    non DM related   Spinal stenosis of lumbar region     Assessment/Plan: 1 Day Post-Op Procedure(s) (LRB): ARTHROPLASTY,  KNEE, TOTAL (Left) Principal Problem:   OA (osteoarthritis) of knee Active Problems:   Primary osteoarthritis of left knee  Estimated body mass index is 37.61 kg/m as calculated from the following:   Height as of this encounter: 5\' 5"  (1.651 m).   Weight as of this encounter: 102.5 kg. Advance diet Up with therapy   DVT Prophylaxis - Aspirin Weight bearing as tolerated. Continue therapy.  Plan is to go to SNF after hospital stay. Possible discharge tomorrow pending bed availability   Arther Abbott, PA-C Orthopedic Surgery [phone] 03/08/2024, 8:17 AM

## 2024-03-08 NOTE — Plan of Care (Signed)
  Problem: Education: Goal: Knowledge of General Education information will improve Description: Including pain rating scale, medication(s)/side effects and non-pharmacologic comfort measures Outcome: Progressing   Problem: Health Behavior/Discharge Planning: Goal: Ability to manage health-related needs will improve Outcome: Progressing   Problem: Clinical Measurements: Goal: Ability to maintain clinical measurements within normal limits will improve Outcome: Progressing Goal: Will remain free from infection Outcome: Progressing Goal: Diagnostic test results will improve Outcome: Progressing Goal: Respiratory complications will improve Outcome: Progressing Goal: Cardiovascular complication will be avoided Outcome: Progressing   Problem: Activity: Goal: Risk for activity intolerance will decrease Outcome: Progressing   Problem: Nutrition: Goal: Adequate nutrition will be maintained Outcome: Completed/Met   Problem: Coping: Goal: Level of anxiety will decrease Outcome: Progressing   Problem: Elimination: Goal: Will not experience complications related to bowel motility Outcome: Progressing Goal: Will not experience complications related to urinary retention Outcome: Progressing   Problem: Pain Managment: Goal: General experience of comfort will improve and/or be controlled Outcome: Progressing   Problem: Safety: Goal: Ability to remain free from injury will improve Outcome: Progressing   Problem: Skin Integrity: Goal: Risk for impaired skin integrity will decrease Outcome: Progressing   Problem: Education: Goal: Knowledge of the prescribed therapeutic regimen will improve Outcome: Progressing Goal: Individualized Educational Video(s) Outcome: Completed/Met   Problem: Activity: Goal: Ability to avoid complications of mobility impairment will improve Outcome: Progressing Goal: Range of joint motion will improve Outcome: Progressing   Problem: Clinical  Measurements: Goal: Postoperative complications will be avoided or minimized Outcome: Progressing   Problem: Pain Management: Goal: Pain level will decrease with appropriate interventions Outcome: Progressing   Problem: Skin Integrity: Goal: Will show signs of wound healing Outcome: Progressing

## 2024-03-09 DIAGNOSIS — M6281 Muscle weakness (generalized): Secondary | ICD-10-CM | POA: Diagnosis not present

## 2024-03-09 DIAGNOSIS — B964 Proteus (mirabilis) (morganii) as the cause of diseases classified elsewhere: Secondary | ICD-10-CM | POA: Diagnosis not present

## 2024-03-09 DIAGNOSIS — E119 Type 2 diabetes mellitus without complications: Secondary | ICD-10-CM | POA: Diagnosis not present

## 2024-03-09 DIAGNOSIS — Z794 Long term (current) use of insulin: Secondary | ICD-10-CM | POA: Diagnosis not present

## 2024-03-09 DIAGNOSIS — N390 Urinary tract infection, site not specified: Secondary | ICD-10-CM | POA: Diagnosis not present

## 2024-03-09 DIAGNOSIS — F413 Other mixed anxiety disorders: Secondary | ICD-10-CM | POA: Diagnosis not present

## 2024-03-09 DIAGNOSIS — G8929 Other chronic pain: Secondary | ICD-10-CM | POA: Diagnosis not present

## 2024-03-09 DIAGNOSIS — Z96652 Presence of left artificial knee joint: Secondary | ICD-10-CM | POA: Diagnosis not present

## 2024-03-09 DIAGNOSIS — Z79899 Other long term (current) drug therapy: Secondary | ICD-10-CM | POA: Diagnosis not present

## 2024-03-09 DIAGNOSIS — Z7401 Bed confinement status: Secondary | ICD-10-CM | POA: Diagnosis not present

## 2024-03-09 DIAGNOSIS — E039 Hypothyroidism, unspecified: Secondary | ICD-10-CM | POA: Diagnosis not present

## 2024-03-09 DIAGNOSIS — Z471 Aftercare following joint replacement surgery: Secondary | ICD-10-CM | POA: Diagnosis not present

## 2024-03-09 DIAGNOSIS — D649 Anemia, unspecified: Secondary | ICD-10-CM | POA: Diagnosis not present

## 2024-03-09 DIAGNOSIS — F4321 Adjustment disorder with depressed mood: Secondary | ICD-10-CM | POA: Diagnosis not present

## 2024-03-09 DIAGNOSIS — R300 Dysuria: Secondary | ICD-10-CM | POA: Diagnosis not present

## 2024-03-09 DIAGNOSIS — E1169 Type 2 diabetes mellitus with other specified complication: Secondary | ICD-10-CM | POA: Diagnosis not present

## 2024-03-09 DIAGNOSIS — J9602 Acute respiratory failure with hypercapnia: Secondary | ICD-10-CM | POA: Diagnosis not present

## 2024-03-09 DIAGNOSIS — R32 Unspecified urinary incontinence: Secondary | ICD-10-CM | POA: Diagnosis not present

## 2024-03-09 DIAGNOSIS — F419 Anxiety disorder, unspecified: Secondary | ICD-10-CM | POA: Diagnosis not present

## 2024-03-09 DIAGNOSIS — R262 Difficulty in walking, not elsewhere classified: Secondary | ICD-10-CM | POA: Diagnosis not present

## 2024-03-09 DIAGNOSIS — Z7984 Long term (current) use of oral hypoglycemic drugs: Secondary | ICD-10-CM | POA: Diagnosis not present

## 2024-03-09 DIAGNOSIS — M1712 Unilateral primary osteoarthritis, left knee: Secondary | ICD-10-CM | POA: Diagnosis not present

## 2024-03-09 DIAGNOSIS — I479 Paroxysmal tachycardia, unspecified: Secondary | ICD-10-CM | POA: Diagnosis not present

## 2024-03-09 DIAGNOSIS — R2689 Other abnormalities of gait and mobility: Secondary | ICD-10-CM | POA: Diagnosis not present

## 2024-03-09 DIAGNOSIS — Z960 Presence of urogenital implants: Secondary | ICD-10-CM | POA: Diagnosis not present

## 2024-03-09 DIAGNOSIS — R41841 Cognitive communication deficit: Secondary | ICD-10-CM | POA: Diagnosis not present

## 2024-03-09 DIAGNOSIS — E1140 Type 2 diabetes mellitus with diabetic neuropathy, unspecified: Secondary | ICD-10-CM | POA: Diagnosis not present

## 2024-03-09 DIAGNOSIS — I10 Essential (primary) hypertension: Secondary | ICD-10-CM | POA: Diagnosis not present

## 2024-03-09 DIAGNOSIS — E662 Morbid (severe) obesity with alveolar hypoventilation: Secondary | ICD-10-CM | POA: Diagnosis not present

## 2024-03-09 DIAGNOSIS — T83511A Infection and inflammatory reaction due to indwelling urethral catheter, initial encounter: Secondary | ICD-10-CM | POA: Diagnosis not present

## 2024-03-09 DIAGNOSIS — F5101 Primary insomnia: Secondary | ICD-10-CM | POA: Diagnosis not present

## 2024-03-09 LAB — CBC
HCT: 31 % — ABNORMAL LOW (ref 36.0–46.0)
Hemoglobin: 9.5 g/dL — ABNORMAL LOW (ref 12.0–15.0)
MCH: 27.2 pg (ref 26.0–34.0)
MCHC: 30.6 g/dL (ref 30.0–36.0)
MCV: 88.8 fL (ref 80.0–100.0)
Platelets: 152 10*3/uL (ref 150–400)
RBC: 3.49 MIL/uL — ABNORMAL LOW (ref 3.87–5.11)
RDW: 15.9 % — ABNORMAL HIGH (ref 11.5–15.5)
WBC: 10.6 10*3/uL — ABNORMAL HIGH (ref 4.0–10.5)
nRBC: 0 % (ref 0.0–0.2)

## 2024-03-09 LAB — GLUCOSE, CAPILLARY
Glucose-Capillary: 131 mg/dL — ABNORMAL HIGH (ref 70–99)
Glucose-Capillary: 162 mg/dL — ABNORMAL HIGH (ref 70–99)

## 2024-03-09 NOTE — Progress Notes (Signed)
   Subjective: 2 Days Post-Op Procedure(s) (LRB): ARTHROPLASTY, KNEE, TOTAL (Left) Patient seen in rounds by Dr. Lequita Halt. No issues overnight. Denies SOB or chest pain. Denies calf pain. Reports pain as moderate.  Objective: Vital signs in last 24 hours: Temp:  [97.6 F (36.4 C)-98.9 F (37.2 C)] 98.9 F (37.2 C) (04/02 0550) Pulse Rate:  [95-108] 95 (04/02 0550) Resp:  [17-18] 18 (04/02 0550) BP: (144-157)/(69-85) 150/85 (04/02 0550) SpO2:  [92 %-100 %] 95 % (04/02 0550)  Intake/Output from previous day:  Intake/Output Summary (Last 24 hours) at 03/09/2024 0819 Last data filed at 03/09/2024 0550 Gross per 24 hour  Intake 938.75 ml  Output 2200 ml  Net -1261.25 ml    Intake/Output this shift: No intake/output data recorded.  Labs: Recent Labs    03/08/24 0349 03/09/24 0325  HGB 10.0* 9.5*   Recent Labs    03/08/24 0349 03/09/24 0325  WBC 11.0* 10.6*  RBC 3.59* 3.49*  HCT 32.1* 31.0*  PLT 157 152   Recent Labs    03/08/24 0349  NA 133*  K 4.5  CL 99  CO2 25  BUN 12  CREATININE 0.84  GLUCOSE 250*  CALCIUM 8.5*   No results for input(s): "LABPT", "INR" in the last 72 hours.  Exam: General - Patient is Alert and Oriented Extremity - Neurologically intact Neurovascular intact Sensation intact distally Dorsiflexion/Plantar flexion intact Dressing/Incision - clean, dry, no drainage Motor Function - intact, moving foot and toes well on exam.  Past Medical History:  Diagnosis Date   Anxiety    Diabetes (HCC) 08/04/2017   Diabetes mellitus without complication (HCC)    new onset diabetic    GERD (gastroesophageal reflux disease)    H/O hiatal hernia    Hypertension    Hypothyroidism    Lumbar radiculopathy    Neuropathy    non related to DM-not sure why   Neuropathy    non DM related   Spinal stenosis of lumbar region     Assessment/Plan: 2 Days Post-Op Procedure(s) (LRB): ARTHROPLASTY, KNEE, TOTAL (Left) Principal Problem:   OA  (osteoarthritis) of knee Active Problems:   Primary osteoarthritis of left knee  Estimated body mass index is 37.61 kg/m as calculated from the following:   Height as of this encounter: 5\' 5"  (1.651 m).   Weight as of this encounter: 102.5 kg.  DVT Prophylaxis - Aspirin Weight-bearing as tolerated.  Continue physical therapy while admitted. Discharge to SNF once bed available.  R. Arcola Jansky, PA-C Orthopedic Surgery 03/09/2024, 8:19 AM

## 2024-03-09 NOTE — Discharge Summary (Signed)
 Physician Discharge Summary   Patient ID: Kathryn Ryan MRN: [id number] DOB/AGE: 07/30/1941 83 y.o.  Admit date: 03/07/2024 Discharge date: 03/09/2024  Primary Diagnosis: Osteoarthritis left knee   Admission Diagnoses:  Past Medical History:  Diagnosis Date   Anxiety    Diabetes (HCC) 08/04/2017   Diabetes mellitus without complication (HCC)    new onset diabetic    GERD (gastroesophageal reflux disease)    H/O hiatal hernia    Hypertension    Hypothyroidism    Lumbar radiculopathy    Neuropathy    non related to DM-not sure why   Neuropathy    non DM related   Spinal stenosis of lumbar region    Discharge Diagnoses:   Principal Problem:   OA (osteoarthritis) of knee Active Problems:   Primary osteoarthritis of left knee  Estimated body mass index is 37.61 kg/m as calculated from the following:   Height as of this encounter: 5\' 5"  (1.651 m).   Weight as of this encounter: 102.5 kg.  Procedure:  Procedure(s) (LRB): ARTHROPLASTY, KNEE, TOTAL (Left)   Consults: None  HPI: Kathryn Ryan is a 83 y.o. year old female with end stage OA of her left knee with progressively worsening pain and dysfunction. She has constant pain, with activity and at rest and significant functional deficits with difficulties even with ADLs. She has had extensive non-op management including analgesics, injections of cortisone and viscosupplements, and home exercise program, but remains in significant pain with significant dysfunction. Radiographs show bone on bone arthritis lateral and patellofemoral. She presents now for left Total Knee Arthroplasty.    Laboratory Data: Admission on 03/07/2024  Component Date Value Ref Range Status   Glucose-Capillary 03/07/2024 155 (H)  70 - 99 mg/dL Final   Glucose reference range applies only to samples taken after fasting for at least 8 hours.   Comment 1 03/07/2024 Notify RN   Final   Glucose-Capillary 03/07/2024 112 (H)  70 - 99 mg/dL Final   Glucose  reference range applies only to samples taken after fasting for at least 8 hours.   Glucose-Capillary 03/07/2024 196 (H)  70 - 99 mg/dL Final   Glucose reference range applies only to samples taken after fasting for at least 8 hours.   WBC 03/08/2024 11.0 (H)  4.0 - 10.5 K/uL Final   RBC 03/08/2024 3.59 (L)  3.87 - 5.11 MIL/uL Final   Hemoglobin 03/08/2024 10.0 (L)  12.0 - 15.0 g/dL Final   HCT 40/98/1191 32.1 (L)  36.0 - 46.0 % Final   MCV 03/08/2024 89.4  80.0 - 100.0 fL Final   MCH 03/08/2024 27.9  26.0 - 34.0 pg Final   MCHC 03/08/2024 31.2  30.0 - 36.0 g/dL Final   RDW 47/82/9562 15.1  11.5 - 15.5 % Final   Platelets 03/08/2024 157  150 - 400 K/uL Final   nRBC 03/08/2024 0.0  0.0 - 0.2 % Final   Performed at [facility], [address] [place], [place] [address]   Sodium 03/08/2024 133 (L)  135 - 145 mmol/L Final   Potassium 03/08/2024 4.5  3.5 - 5.1 mmol/L Final   Chloride 03/08/2024 99  98 - 111 mmol/L Final   CO2 03/08/2024 25  22 - 32 mmol/L Final   Glucose, Bld 03/08/2024 250 (H)  70 - 99 mg/dL Final   Glucose reference range applies only to samples taken after fasting for at least 8 hours.   BUN 03/08/2024 12  8 - 23 mg/dL  Final   Creatinine, Ser 03/08/2024 0.84  0.44 - 1.00 mg/dL Final   Calcium 16/09/9603 8.5 (L)  8.9 - 10.3 mg/dL Final   GFR, Estimated 03/08/2024 >60  >60 mL/min Final   Comment: (NOTE) Calculated using the CKD-EPI Creatinine Equation (2021)    Anion gap 03/08/2024 9  5 - 15 Final   Performed at [facility], [address], [place], [place] [address]   Glucose-Capillary 03/07/2024 334 (H)  70 - 99 mg/dL Final   Glucose reference range applies only to samples taken after fasting for at least 8 hours.   Glucose-Capillary 03/08/2024 228 (H)  70 - 99 mg/dL Final   Glucose reference range applies only to samples taken after fasting for at least 8 hours.   Glucose-Capillary 03/08/2024 213 (H)  70 - 99 mg/dL Final    Glucose reference range applies only to samples taken after fasting for at least 8 hours.   Glucose-Capillary 03/08/2024 245 (H)  70 - 99 mg/dL Final   Glucose reference range applies only to samples taken after fasting for at least 8 hours.   Glucose-Capillary 03/08/2024 239 (H)  70 - 99 mg/dL Final   Glucose reference range applies only to samples taken after fasting for at least 8 hours.   WBC 03/09/2024 10.6 (H)  4.0 - 10.5 K/uL Final   RBC 03/09/2024 3.49 (L)  3.87 - 5.11 MIL/uL Final   Hemoglobin 03/09/2024 9.5 (L)  12.0 - 15.0 g/dL Final   HCT 11/91/4782 31.0 (L)  36.0 - 46.0 % Final   MCV 03/09/2024 88.8  80.0 - 100.0 fL Final   MCH 03/09/2024 27.2  26.0 - 34.0 pg Final   MCHC 03/09/2024 30.6  30.0 - 36.0 g/dL Final   RDW 95/62/1308 15.9 (H)  11.5 - 15.5 % Final   Platelets 03/09/2024 152  150 - 400 K/uL Final   nRBC 03/09/2024 0.0  0.0 - 0.2 % Final   Performed at [facility], [address], [place], [place] [address]   Glucose-Capillary 03/08/2024 200 (H)  70 - 99 mg/dL Final   Glucose reference range applies only to samples taken after fasting for at least 8 hours.   Glucose-Capillary 03/08/2024 243 (H)  70 - 99 mg/dL Final   Glucose reference range applies only to samples taken after fasting for at least 8 hours.   Glucose-Capillary 03/09/2024 131 (H)  70 - 99 mg/dL Final   Glucose reference range applies only to samples taken after fasting for at least 8 hours.  Hospital Outpatient Visit on 02/26/2024  Component Date Value Ref Range Status   MRSA, PCR 02/26/2024 NEGATIVE  NEGATIVE Final   Staphylococcus aureus 02/26/2024 POSITIVE (A)  NEGATIVE Final   Comment: (NOTE) The Xpert SA Assay (FDA approved for NASAL specimens in patients 78 years of age and older), is one component of a comprehensive surveillance program. It is not intended to diagnose infection nor to guide or monitor treatment. Performed at [facility], [address] [place], [place] [address]    Hgb A1c MFr Bld 02/26/2024 6.3 (H)  4.8 - 5.6 % Final   Comment: (NOTE) Pre diabetes:          5.7%-6.4%  Diabetes:              >6.4%  Glycemic control for   <7.0% adults with diabetes    Mean Plasma Glucose 02/26/2024 134.11  mg/dL Final   Performed at [facility], [address], [place], [place] [address]   Sodium 02/26/2024 136  135 - 145 mmol/L Final   Potassium 02/26/2024 4.5  3.5 - 5.1 mmol/L Final   Chloride 02/26/2024 99  98 - 111 mmol/L Final   CO2 02/26/2024 27  22 - 32 mmol/L Final   Glucose, Bld 02/26/2024 146 (H)  70 - 99 mg/dL Final   Glucose reference range applies only to samples taken after fasting for at least 8 hours.   BUN 02/26/2024 10  8 - 23 mg/dL Final   Creatinine, Ser 02/26/2024 0.69  0.44 - 1.00 mg/dL Final   Calcium 72/53/6644 9.1  8.9 - 10.3 mg/dL Final   GFR, Estimated 02/26/2024 >60  >60 mL/min Final   Comment: (NOTE) Calculated using the CKD-EPI Creatinine Equation (2021)    Anion gap 02/26/2024 10  5 - 15 Final   Performed at [facility], [address] [place], [place] [address]   WBC 02/26/2024 9.4  4.0 - 10.5 K/uL Final   RBC 02/26/2024 4.52  3.87 - 5.11 MIL/uL Final   Hemoglobin 02/26/2024 12.3  12.0 - 15.0 g/dL Final   HCT 25/95/6387 39.3  36.0 - 46.0 % Final   MCV 02/26/2024 86.9  80.0 - 100.0 fL Final   MCH 02/26/2024 27.2  26.0 - 34.0 pg Final   MCHC 02/26/2024 31.3  30.0 - 36.0 g/dL Final   RDW 56/43/3295 15.4  11.5 - 15.5 % Final   Platelets 02/26/2024 226  150 - 400 K/uL Final   nRBC 02/26/2024 0.0  0.0 - 0.2 % Final   Performed at [facility], [address], [place], [place] [address]   Glucose-Capillary 02/26/2024 126 (H)  70 - 99 mg/dL Final   Glucose reference range applies only to samples taken after fasting for at least 8 hours.     X-Rays:No results found.  EKG: Orders placed or performed during the hospital encounter of 02/26/24   EKG 12  lead per protocol   EKG 12 lead per protocol     Hospital Course: Kathryn Ryan is a 83 y.o. who was admitted to [facility]. They were brought to the operating room on 03/07/2024 and underwent Procedure(s): ARTHROPLASTY, KNEE, TOTAL.  Patient tolerated the procedure well and was later transferred to the recovery room and then to the orthopaedic floor for postoperative care. They were given PO and IV analgesics for pain control following their surgery. They were given 24 hours of postoperative antibiotics of  Anti-infectives (From admission, onward)    Start     Dose/Rate Route Frequency Ordered Stop   03/07/24 1830  ceFAZolin (ANCEF) IVPB 2g/100 mL premix        2 g 200 mL/hr over 30 Minutes Intravenous Every 6 hours 03/07/24 1613 03/08/24 0300   03/07/24 1030  ceFAZolin (ANCEF) IVPB 2g/100 mL premix        2 g 200 mL/hr over 30 Minutes Intravenous On call to O.R. 03/07/24 1029 03/07/24 1241     and started on DVT prophylaxis in the form of Aspirin.   PT and OT were ordered for total joint protocol. Discharge planning consulted to help with post-op disposition and equipment needs. Patient had a fair night on the evening of surgery. They started to get up OOB with physical therapy on POD #1. Continued to work with physical therapy into POD #2. Patient was seen during rounds on day two and was ready to discharge to SNF for  continued care. She was discharged later that day in stable condition.  Diet: Regular diet Activity: WBAT Follow-up: in 2 weeks Disposition: Skilled nursing facility Discharged Condition: stable   Discharge Instructions     Call MD / Call 911   Complete by: As directed    If you experience chest pain or shortness of breath, CALL 911 and be transported to the hospital emergency room.  If you develope a fever above 101 F, pus (white drainage) or increased drainage or redness at the wound, or calf pain, call your surgeon's office.   Change dressing   Complete  by: As directed    You may remove the bulky bandage (ACE wrap and gauze) two days after surgery. You will have an adhesive waterproof bandage underneath. Leave this in place until your first follow-up appointment.   Constipation Prevention   Complete by: As directed    Drink plenty of fluids.  Prune juice may be helpful.  You may use a stool softener, such as Colace (over the counter) 100 mg twice a day.  Use MiraLax (over the counter) for constipation as needed.   Diet - low sodium heart healthy   Complete by: As directed    Do not put a pillow under the knee. Place it under the heel.   Complete by: As directed    Driving restrictions   Complete by: As directed    No driving for two weeks   Post-operative opioid taper instructions:   Complete by: As directed    POST-OPERATIVE OPIOID TAPER INSTRUCTIONS: It is important to wean off of your opioid medication as soon as possible. If you do not need pain medication after your surgery it is ok to stop day one. Opioids include: Codeine, Hydrocodone(Norco, Vicodin), Oxycodone(Percocet, oxycontin) and hydromorphone amongst others.  Long term and even short term use of opiods can cause: Increased pain response Dependence Constipation Depression Respiratory depression And more.  Withdrawal symptoms can include Flu like symptoms Nausea, vomiting And more Techniques to manage these symptoms Hydrate well Eat regular healthy meals Stay active Use relaxation techniques(deep breathing, meditating, yoga) Do Not substitute Alcohol to help with tapering If you have been on opioids for less than two weeks and do not have pain than it is ok to stop all together.  Plan to wean off of opioids This plan should start within one week post op of your joint replacement. Maintain the same interval or time between taking each dose and first decrease the dose.  Cut the total daily intake of opioids by one tablet each day Next start to increase the time  between doses. The last dose that should be eliminated is the evening dose.      TED hose   Complete by: As directed    Use stockings (TED hose) for three weeks on both leg(s).  You may remove them at night for sleeping.   Weight bearing as tolerated   Complete by: As directed       Allergies as of 03/09/2024       Reactions   Sulfa Antibiotics Itching        Medication List     STOP taking these medications    HYDROcodone-acetaminophen 5-325 mg Tabs tablet Commonly known as: VICODIN       TAKE these medications    aspirin 81 MG chewable tablet Chew 1 tablet (81 mg total) by mouth 2 (two) times daily for 21 days. Then take one 81 mg aspirin once a day  for three weeks. Then discontinue aspirin.   atorvastatin 10 MG tablet Commonly known as: LIPITOR Take 10 mg by mouth every morning.   Bydureon BCise 2 MG/0.85ML Auij Generic drug: Exenatide ER Inject 2 mg into the skin every Thursday.   chlorhexidine 4 % external liquid Commonly known as: HIBICLENS Apply 15 mLs (1 Application total) topically as directed for 30 doses. Use as directed daily for 5 days every other week for 6 weeks.   cholecalciferol 25 MCG (1000 UNIT) tablet Commonly known as: VITAMIN D3 Take 2,000 Units by mouth every morning.   Cranberry 200 MG Caps Take 200 mg by mouth in the morning and at bedtime.   DULoxetine 60 MG capsule Commonly known as: CYMBALTA Take 60 mg by mouth 2 (two) times daily.   Lantus SoloStar 100 UNIT/ML Solostar Pen Generic drug: insulin glargine Inject 120 Units into the skin at bedtime.   levothyroxine 150 MCG tablet Commonly known as: SYNTHROID Take 150 mcg by mouth daily before breakfast.   Melatonin 12 MG Tabs Take 12 mg by mouth at bedtime.   metFORMIN 500 MG 24 hr tablet Commonly known as: GLUCOPHAGE-XR Take 500 mg by mouth daily.   methocarbamol 500 MG tablet Commonly known as: ROBAXIN Take 1 tablet (500 mg total) by mouth every 6 (six) hours as  needed for muscle spasms.   mupirocin ointment 2 % Commonly known as: BACTROBAN Place 1 Application into the nose 2 (two) times daily for 60 doses. Use as directed 2 times daily for 5 days every other week for 6 weeks.   Myrbetriq 50 MG Tb24 tablet Generic drug: mirabegron ER Take 1 tablet (50 mg total) by mouth daily.   nitrofurantoin 100 MG capsule Commonly known as: MACRODANTIN Take 100 mg by mouth 2 (two) times daily.   omeprazole 20 MG capsule Commonly known as: PRILOSEC Take 20 mg by mouth every morning.   ondansetron 4 MG tablet Commonly known as: ZOFRAN Take 4 mg by mouth every 8 (eight) hours as needed for nausea or vomiting. What changed: Another medication with the same name was added. Make sure you understand how and when to take each.   ondansetron 4 MG tablet Commonly known as: ZOFRAN Take 1 tablet (4 mg total) by mouth every 6 (six) hours as needed for nausea. What changed: You were already taking a medication with the same name, and this prescription was added. Make sure you understand how and when to take each.   oxyCODONE 5 MG immediate release tablet Commonly known as: Oxy IR/ROXICODONE Take 1-2 tablets (5-10 mg total) by mouth every 6 (six) hours as needed for severe pain (pain score 7-10).   traMADol 50 MG tablet Commonly known as: ULTRAM Take 1-2 tablets (50-100 mg total) by mouth every 6 (six) hours as needed for moderate pain (pain score 4-6).   traZODone 100 MG tablet Commonly known as: DESYREL Take 100 mg by mouth at bedtime.               Discharge Care Instructions  (From admission, onward)           Start     Ordered   03/08/24 0000  Weight bearing as tolerated        03/08/24 0821   03/08/24 0000  Change dressing       Comments: You may remove the bulky bandage (ACE wrap and gauze) two days after surgery. You will have an adhesive waterproof bandage underneath. Leave this in place until your  first follow-up appointment.    03/08/24 9528            Contact information for follow-up providers     Ollen Gross, MD. Go on 03/22/2024.   Specialty: Orthopedic Surgery Why: You have a post op appointment scheduled for Tuesday 03/22/24 at 1:30pm Contact information: [address] [place] [address] [phone]              Contact information for after-discharge care     Destination     [facility] .   Service: Skilled Nursing Contact information: [address] [place] [address] [phone]                     Signed: R. Arcola Jansky, PA-C Orthopedic Surgery 03/09/2024, 9:57 AM

## 2024-03-09 NOTE — Progress Notes (Signed)
 Physical Therapy Treatment Patient Details Name: Kathryn Ryan MRN: [id number] DOB: Jun 05, 1941 Today's Date: 03/09/2024   History of Present Illness 83 yo female s/p L TKA on 03/07/24. PMH: orthostatic hypotension, MDD, renal mass, adrenal mass, DM, HTN, morbid obesity, HTN    PT Comments  Pt making limited progress today d/t incr pain, obtunded state likely d/t meds;  mod to max assist for bed mobility;  SpO2=84% on RA with activity, replaced O2 at 3L to incr to 90%, decr to 2 L with pt maintaining O2 saturation in 90s at rest.Rn aware   If plan is discharge home, recommend the following: A little help with walking and/or transfers;A little help with bathing/dressing/bathroom;Help with stairs or ramp for entrance;Assist for transportation   Can travel by private vehicle     No  Equipment Recommendations  None recommended by PT    Recommendations for Other Services       Precautions / Restrictions Precautions Precautions: Fall;Knee Recall of Precautions/Restrictions: Impaired Precaution/Restrictions Comments: reviewed importance of terminal knee extension; repositioned in KI. pt positions LLE in kne flexion and external rotation despite staff efforts to correct Required Braces or Orthoses: Knee Immobilizer - Left Knee Immobilizer - Left: Discontinue once straight leg raise with < 10 degree lag Restrictions Weight Bearing Restrictions Per Provider Order: No Other Position/Activity Restrictions: WBAT     Mobility  Bed Mobility Overal bed mobility: Needs Assistance Bed Mobility: Sit to Supine, Rolling, Sidelying to Sit Rolling: Min assist, Mod assist Sidelying to sit: Mod assist, Max assist   Sit to supine: Mod assist   General bed mobility comments: cues to self assist, use of rails and sequencing. assist with LEs and trunk; pt able to self assist scooting up in bed in supine with use of bed features    Transfers Overall transfer level: Needs assistance                  General transfer comment: unable to stand d/t pain    Ambulation/Gait                   Stairs             Wheelchair Mobility     Tilt Bed    Modified Rankin (Stroke Patients Only)       Balance                                            Communication Communication Communication: Impaired Factors Affecting Communication: Hearing impaired  Cognition Arousal: Suspect due to medications, Obtunded Behavior During Therapy: WFL for tasks assessed/performed   PT - Cognitive impairments: Orientation, Sequencing, Problem solving, Safety/Judgement   Orientation impairments: Situation, Time                     Following commands: Impaired Following commands impaired: Only follows one step commands consistently, Follows one step commands with increased time    Cueing Cueing Techniques: Verbal cues, Tactile cues, Gestural cues, Visual cues  Exercises Total Joint Exercises Ankle Circles/Pumps: AROM, Right, 5 reps Quad Sets: AROM, Both, 5 reps Heel Slides: AAROM, Left, 5 reps    General Comments        Pertinent Vitals/Pain Pain Assessment Pain Assessment: Faces Faces Pain Scale: Hurts whole lot Pain Location: L knee Pain Descriptors / Indicators: Sore, Discomfort, Grimacing Pain Intervention(s): Limited activity within patient's  tolerance, Monitored during session, Premedicated before session, Repositioned    Home Living                          Prior Function            PT Goals (current goals can now be found in the care plan section) Acute Rehab PT Goals PT Goal Formulation: With patient Time For Goal Achievement: 03/22/24 Potential to Achieve Goals: Good Progress towards PT goals: Not progressing toward goals - comment (obtunded)    Frequency    7X/week      PT Plan      Co-evaluation              AM-PAC PT "6 Clicks" Mobility   Outcome Measure  Help needed turning from your  back to your side while in a flat bed without using bedrails?: A Little Help needed moving from lying on your back to sitting on the side of a flat bed without using bedrails?: A Little Help needed moving to and from a bed to a chair (including a wheelchair)?: A Little Help needed standing up from a chair using your arms (e.g., wheelchair or bedside chair)?: A Little Help needed to walk in hospital room?: A Little Help needed climbing 3-5 steps with a railing? : A Little 6 Click Score: 18    End of Session Equipment Utilized During Treatment: Gait belt Activity Tolerance: Patient tolerated treatment well Patient left: in bed;with call bell/phone within reach;with bed alarm set Nurse Communication: Mobility status PT Visit Diagnosis: Other abnormalities of gait and mobility (R26.89)     Time: 1015-1040 PT Time Calculation (min) (ACUTE ONLY): 25 min  Charges:    $Therapeutic Activity: 23-37 mins PT General Charges $$ ACUTE PT VISIT: 1 Visit                     Delice Bison, PT  Acute Rehab Dept [facility]) [phone]  03/09/2024    [facility] 03/09/2024, 10:53 AM

## 2024-03-09 NOTE — Progress Notes (Signed)
 Called but unable to reach anyone to give report. Msg left to call back.

## 2024-03-09 NOTE — TOC Transition Note (Signed)
 Transition of Care [facility]) - Discharge Note   Patient Details  Name: Kathryn Ryan MRN: [id number] Date of Birth: 1941-07-15  Transition of Care [facility]) CM/SW Contact:  [name], LCSW Phone Number: 03/09/2024, 12:12 PM   Clinical Narrative:     Pt medically cleared for dc today to [facility] SN[facility].  Pt and son aware and agreeable.  PTAR called at 12pm.  RN to call report to [phone].  No further TOC needs.  Final next level of care: Skilled Nursing Facility Barriers to Discharge: Barriers Resolved   Patient Goals and CMS Choice Patient states their goals for this hospitalization and ongoing recovery are:: return home          Discharge Placement   Existing PASRR number confirmed : 03/08/24          Patient chooses bed at: [facility] Patient to be transferred to facility by: PTAR Name of family member notified: son, Brynda Greathouse Patient and family notified of of transfer: 03/09/24  Discharge Plan and Services Additional resources added to the After Visit Summary for   In-house Referral: Clinical Social Work   Post Acute Care Choice: Skilled Nursing Facility          DME Arranged: N/A DME Agency: NA                  Social Drivers of Health (SDOH) Interventions SDOH Screenings   Food Insecurity: No Food Insecurity (03/07/2024)  Housing: Unknown (03/07/2024)  Transportation Needs: No Transportation Needs (03/07/2024)  Utilities: Not At Risk (03/07/2024)  Financial Resource Strain: Low Risk  (11/05/2021)   Received from [facility], [facility]  Social Connections: Unknown (03/07/2024)  Stress: No Stress Concern Present (11/03/2021)   Received from [facility], [facility]  Tobacco Use: Medium Risk (03/07/2024)  Health Literacy: Medium Risk (11/23/2020)   Received from [facility], [facility]     Readmission Risk Interventions     No data to display

## 2024-03-09 NOTE — Plan of Care (Signed)
   Problem: Activity: Goal: Risk for activity intolerance will decrease Outcome: Progressing   Problem: Pain Managment: Goal: General experience of comfort will improve and/or be controlled Outcome: Progressing   Problem: Safety: Goal: Ability to remain free from injury will improve Outcome: Progressing

## 2024-03-11 ENCOUNTER — Other Ambulatory Visit: Payer: Self-pay | Admitting: *Deleted

## 2024-03-11 NOTE — Patient Outreach (Signed)
 Mrs. Chancellor admitted to [facility] under [facility] waiver.  Screening for potential complex care management services as benefit of health plan and primary care provider.  Secure communication sent to Fredia Beets social worker to make aware Clinical research associate will follow for transition plans and progress.  Will continue to follow.    Raiford Noble, MSN, RN, BSN [place]  [facility], [facility] RN Post- Acute Care Manager Direct Dial: [phone]

## 2024-03-14 DIAGNOSIS — N390 Urinary tract infection, site not specified: Secondary | ICD-10-CM | POA: Diagnosis not present

## 2024-03-14 DIAGNOSIS — T83511A Infection and inflammatory reaction due to indwelling urethral catheter, initial encounter: Secondary | ICD-10-CM | POA: Diagnosis not present

## 2024-03-14 DIAGNOSIS — B964 Proteus (mirabilis) (morganii) as the cause of diseases classified elsewhere: Secondary | ICD-10-CM | POA: Diagnosis not present

## 2024-03-14 DIAGNOSIS — Z960 Presence of urogenital implants: Secondary | ICD-10-CM | POA: Diagnosis not present

## 2024-03-16 DIAGNOSIS — N390 Urinary tract infection, site not specified: Secondary | ICD-10-CM | POA: Diagnosis not present

## 2024-03-16 DIAGNOSIS — R32 Unspecified urinary incontinence: Secondary | ICD-10-CM | POA: Diagnosis not present

## 2024-03-17 ENCOUNTER — Ambulatory Visit: Admitting: Urology

## 2024-03-23 DIAGNOSIS — F5101 Primary insomnia: Secondary | ICD-10-CM | POA: Diagnosis not present

## 2024-03-23 DIAGNOSIS — F413 Other mixed anxiety disorders: Secondary | ICD-10-CM | POA: Diagnosis not present

## 2024-03-23 DIAGNOSIS — F4321 Adjustment disorder with depressed mood: Secondary | ICD-10-CM | POA: Diagnosis not present

## 2024-03-24 ENCOUNTER — Ambulatory Visit: Admitting: Urology

## 2024-03-26 DIAGNOSIS — Z9981 Dependence on supplemental oxygen: Secondary | ICD-10-CM | POA: Diagnosis not present

## 2024-03-26 DIAGNOSIS — N189 Chronic kidney disease, unspecified: Secondary | ICD-10-CM | POA: Diagnosis not present

## 2024-03-26 DIAGNOSIS — G4733 Obstructive sleep apnea (adult) (pediatric): Secondary | ICD-10-CM | POA: Diagnosis not present

## 2024-03-26 DIAGNOSIS — E1122 Type 2 diabetes mellitus with diabetic chronic kidney disease: Secondary | ICD-10-CM | POA: Diagnosis not present

## 2024-03-26 DIAGNOSIS — G609 Hereditary and idiopathic neuropathy, unspecified: Secondary | ICD-10-CM | POA: Diagnosis not present

## 2024-03-26 DIAGNOSIS — F419 Anxiety disorder, unspecified: Secondary | ICD-10-CM | POA: Diagnosis not present

## 2024-03-26 DIAGNOSIS — E662 Morbid (severe) obesity with alveolar hypoventilation: Secondary | ICD-10-CM | POA: Diagnosis not present

## 2024-03-26 DIAGNOSIS — E1169 Type 2 diabetes mellitus with other specified complication: Secondary | ICD-10-CM | POA: Diagnosis not present

## 2024-03-26 DIAGNOSIS — E1142 Type 2 diabetes mellitus with diabetic polyneuropathy: Secondary | ICD-10-CM | POA: Diagnosis not present

## 2024-03-26 DIAGNOSIS — D649 Anemia, unspecified: Secondary | ICD-10-CM | POA: Diagnosis not present

## 2024-03-26 DIAGNOSIS — Z960 Presence of urogenital implants: Secondary | ICD-10-CM | POA: Diagnosis not present

## 2024-03-26 DIAGNOSIS — Z7984 Long term (current) use of oral hypoglycemic drugs: Secondary | ICD-10-CM | POA: Diagnosis not present

## 2024-03-26 DIAGNOSIS — Z794 Long term (current) use of insulin: Secondary | ICD-10-CM | POA: Diagnosis not present

## 2024-03-26 DIAGNOSIS — J9602 Acute respiratory failure with hypercapnia: Secondary | ICD-10-CM | POA: Diagnosis not present

## 2024-03-26 DIAGNOSIS — Z471 Aftercare following joint replacement surgery: Secondary | ICD-10-CM | POA: Diagnosis not present

## 2024-03-26 DIAGNOSIS — M5416 Radiculopathy, lumbar region: Secondary | ICD-10-CM | POA: Diagnosis not present

## 2024-03-26 DIAGNOSIS — E039 Hypothyroidism, unspecified: Secondary | ICD-10-CM | POA: Diagnosis not present

## 2024-03-26 DIAGNOSIS — Z96652 Presence of left artificial knee joint: Secondary | ICD-10-CM | POA: Diagnosis not present

## 2024-03-26 DIAGNOSIS — I129 Hypertensive chronic kidney disease with stage 1 through stage 4 chronic kidney disease, or unspecified chronic kidney disease: Secondary | ICD-10-CM | POA: Diagnosis not present

## 2024-03-26 DIAGNOSIS — M48061 Spinal stenosis, lumbar region without neurogenic claudication: Secondary | ICD-10-CM | POA: Diagnosis not present

## 2024-03-26 DIAGNOSIS — I479 Paroxysmal tachycardia, unspecified: Secondary | ICD-10-CM | POA: Diagnosis not present

## 2024-03-26 DIAGNOSIS — Z556 Problems related to health literacy: Secondary | ICD-10-CM | POA: Diagnosis not present

## 2024-03-28 DIAGNOSIS — Z471 Aftercare following joint replacement surgery: Secondary | ICD-10-CM | POA: Diagnosis not present

## 2024-03-28 DIAGNOSIS — N189 Chronic kidney disease, unspecified: Secondary | ICD-10-CM | POA: Diagnosis not present

## 2024-03-28 DIAGNOSIS — E1122 Type 2 diabetes mellitus with diabetic chronic kidney disease: Secondary | ICD-10-CM | POA: Diagnosis not present

## 2024-03-28 DIAGNOSIS — I129 Hypertensive chronic kidney disease with stage 1 through stage 4 chronic kidney disease, or unspecified chronic kidney disease: Secondary | ICD-10-CM | POA: Diagnosis not present

## 2024-03-28 DIAGNOSIS — Z794 Long term (current) use of insulin: Secondary | ICD-10-CM | POA: Diagnosis not present

## 2024-03-28 DIAGNOSIS — E1142 Type 2 diabetes mellitus with diabetic polyneuropathy: Secondary | ICD-10-CM | POA: Diagnosis not present

## 2024-03-29 DIAGNOSIS — Z794 Long term (current) use of insulin: Secondary | ICD-10-CM | POA: Diagnosis not present

## 2024-03-29 DIAGNOSIS — Z471 Aftercare following joint replacement surgery: Secondary | ICD-10-CM | POA: Diagnosis not present

## 2024-03-29 DIAGNOSIS — E1122 Type 2 diabetes mellitus with diabetic chronic kidney disease: Secondary | ICD-10-CM | POA: Diagnosis not present

## 2024-03-29 DIAGNOSIS — N189 Chronic kidney disease, unspecified: Secondary | ICD-10-CM | POA: Diagnosis not present

## 2024-03-29 DIAGNOSIS — E1142 Type 2 diabetes mellitus with diabetic polyneuropathy: Secondary | ICD-10-CM | POA: Diagnosis not present

## 2024-03-29 DIAGNOSIS — I129 Hypertensive chronic kidney disease with stage 1 through stage 4 chronic kidney disease, or unspecified chronic kidney disease: Secondary | ICD-10-CM | POA: Diagnosis not present

## 2024-03-29 MED ORDER — SOLIFENACIN SUCCINATE 5 MG PO TABS: 5.0000 mg | ORAL_TABLET | Freq: Every day | ORAL | 1 refills | Status: DC

## 2024-03-29 NOTE — Addendum Note (Signed)
 Addended by: Dyke Glasser on: 03/29/2024 04:13 PM   Modules accepted: Orders

## 2024-03-29 NOTE — Telephone Encounter (Addendum)
 Called to inform that pt has been sent in a new Rx nurse verified correct pharmacy and asked for a copy to be faxed over

## 2024-03-30 ENCOUNTER — Telehealth: Payer: Self-pay

## 2024-03-30 NOTE — Telephone Encounter (Signed)
 Medication prior authorization request received.  Completed PA request through cover my meds for drug Solifenacin  Succinate 5 MG. KEY: [id number]  Approved: Pending

## 2024-03-31 DIAGNOSIS — E1142 Type 2 diabetes mellitus with diabetic polyneuropathy: Secondary | ICD-10-CM | POA: Diagnosis not present

## 2024-03-31 DIAGNOSIS — E1122 Type 2 diabetes mellitus with diabetic chronic kidney disease: Secondary | ICD-10-CM | POA: Diagnosis not present

## 2024-03-31 DIAGNOSIS — Z794 Long term (current) use of insulin: Secondary | ICD-10-CM | POA: Diagnosis not present

## 2024-03-31 DIAGNOSIS — I129 Hypertensive chronic kidney disease with stage 1 through stage 4 chronic kidney disease, or unspecified chronic kidney disease: Secondary | ICD-10-CM | POA: Diagnosis not present

## 2024-03-31 DIAGNOSIS — N189 Chronic kidney disease, unspecified: Secondary | ICD-10-CM | POA: Diagnosis not present

## 2024-03-31 DIAGNOSIS — Z471 Aftercare following joint replacement surgery: Secondary | ICD-10-CM | POA: Diagnosis not present

## 2024-04-04 DIAGNOSIS — I479 Paroxysmal tachycardia, unspecified: Secondary | ICD-10-CM | POA: Diagnosis not present

## 2024-04-04 DIAGNOSIS — E662 Morbid (severe) obesity with alveolar hypoventilation: Secondary | ICD-10-CM | POA: Diagnosis not present

## 2024-04-04 DIAGNOSIS — I129 Hypertensive chronic kidney disease with stage 1 through stage 4 chronic kidney disease, or unspecified chronic kidney disease: Secondary | ICD-10-CM | POA: Diagnosis not present

## 2024-04-04 DIAGNOSIS — Z794 Long term (current) use of insulin: Secondary | ICD-10-CM | POA: Diagnosis not present

## 2024-04-04 DIAGNOSIS — E1169 Type 2 diabetes mellitus with other specified complication: Secondary | ICD-10-CM | POA: Diagnosis not present

## 2024-04-04 DIAGNOSIS — D649 Anemia, unspecified: Secondary | ICD-10-CM | POA: Diagnosis not present

## 2024-04-04 DIAGNOSIS — M5416 Radiculopathy, lumbar region: Secondary | ICD-10-CM | POA: Diagnosis not present

## 2024-04-04 DIAGNOSIS — Z471 Aftercare following joint replacement surgery: Secondary | ICD-10-CM | POA: Diagnosis not present

## 2024-04-04 DIAGNOSIS — E1122 Type 2 diabetes mellitus with diabetic chronic kidney disease: Secondary | ICD-10-CM | POA: Diagnosis not present

## 2024-04-04 DIAGNOSIS — M48061 Spinal stenosis, lumbar region without neurogenic claudication: Secondary | ICD-10-CM | POA: Diagnosis not present

## 2024-04-04 DIAGNOSIS — N189 Chronic kidney disease, unspecified: Secondary | ICD-10-CM | POA: Diagnosis not present

## 2024-04-04 DIAGNOSIS — E1142 Type 2 diabetes mellitus with diabetic polyneuropathy: Secondary | ICD-10-CM | POA: Diagnosis not present

## 2024-04-05 ENCOUNTER — Other Ambulatory Visit: Payer: Self-pay | Admitting: *Deleted

## 2024-04-05 NOTE — Patient Outreach (Signed)
 Post-Acute Care Manager follow up. Mrs. Scherger utilized [facility] waiver to [facility] previously. Screening for potential complex care management services as benefit of health plan and primary care provider.  Collaboration with Whitney, [facility] social worker. Kathryn Ryan discharged from [facility] on 03/25/24. She returned to ALF in [place].   No identifiable complex care management needs.    Nolberto Batty, MSN, RN, BSN [place]  [facility], [facility] RN Post- Acute Care Manager Direct Dial: [phone]

## 2024-04-06 DIAGNOSIS — E1122 Type 2 diabetes mellitus with diabetic chronic kidney disease: Secondary | ICD-10-CM | POA: Diagnosis not present

## 2024-04-06 DIAGNOSIS — N189 Chronic kidney disease, unspecified: Secondary | ICD-10-CM | POA: Diagnosis not present

## 2024-04-06 DIAGNOSIS — E1142 Type 2 diabetes mellitus with diabetic polyneuropathy: Secondary | ICD-10-CM | POA: Diagnosis not present

## 2024-04-06 DIAGNOSIS — Z794 Long term (current) use of insulin: Secondary | ICD-10-CM | POA: Diagnosis not present

## 2024-04-06 DIAGNOSIS — I129 Hypertensive chronic kidney disease with stage 1 through stage 4 chronic kidney disease, or unspecified chronic kidney disease: Secondary | ICD-10-CM | POA: Diagnosis not present

## 2024-04-06 DIAGNOSIS — Z471 Aftercare following joint replacement surgery: Secondary | ICD-10-CM | POA: Diagnosis not present

## 2024-04-08 DIAGNOSIS — I129 Hypertensive chronic kidney disease with stage 1 through stage 4 chronic kidney disease, or unspecified chronic kidney disease: Secondary | ICD-10-CM | POA: Diagnosis not present

## 2024-04-08 DIAGNOSIS — E1122 Type 2 diabetes mellitus with diabetic chronic kidney disease: Secondary | ICD-10-CM | POA: Diagnosis not present

## 2024-04-08 DIAGNOSIS — N189 Chronic kidney disease, unspecified: Secondary | ICD-10-CM | POA: Diagnosis not present

## 2024-04-08 DIAGNOSIS — Z471 Aftercare following joint replacement surgery: Secondary | ICD-10-CM | POA: Diagnosis not present

## 2024-04-08 DIAGNOSIS — Z794 Long term (current) use of insulin: Secondary | ICD-10-CM | POA: Diagnosis not present

## 2024-04-08 DIAGNOSIS — E1142 Type 2 diabetes mellitus with diabetic polyneuropathy: Secondary | ICD-10-CM | POA: Diagnosis not present

## 2024-04-11 DIAGNOSIS — E1142 Type 2 diabetes mellitus with diabetic polyneuropathy: Secondary | ICD-10-CM | POA: Diagnosis not present

## 2024-04-11 DIAGNOSIS — Z471 Aftercare following joint replacement surgery: Secondary | ICD-10-CM | POA: Diagnosis not present

## 2024-04-11 DIAGNOSIS — Z794 Long term (current) use of insulin: Secondary | ICD-10-CM | POA: Diagnosis not present

## 2024-04-11 DIAGNOSIS — E1122 Type 2 diabetes mellitus with diabetic chronic kidney disease: Secondary | ICD-10-CM | POA: Diagnosis not present

## 2024-04-11 DIAGNOSIS — I129 Hypertensive chronic kidney disease with stage 1 through stage 4 chronic kidney disease, or unspecified chronic kidney disease: Secondary | ICD-10-CM | POA: Diagnosis not present

## 2024-04-11 DIAGNOSIS — N189 Chronic kidney disease, unspecified: Secondary | ICD-10-CM | POA: Diagnosis not present

## 2024-04-13 DIAGNOSIS — E1142 Type 2 diabetes mellitus with diabetic polyneuropathy: Secondary | ICD-10-CM | POA: Diagnosis not present

## 2024-04-13 DIAGNOSIS — Z794 Long term (current) use of insulin: Secondary | ICD-10-CM | POA: Diagnosis not present

## 2024-04-13 DIAGNOSIS — E1122 Type 2 diabetes mellitus with diabetic chronic kidney disease: Secondary | ICD-10-CM | POA: Diagnosis not present

## 2024-04-13 DIAGNOSIS — I129 Hypertensive chronic kidney disease with stage 1 through stage 4 chronic kidney disease, or unspecified chronic kidney disease: Secondary | ICD-10-CM | POA: Diagnosis not present

## 2024-04-13 DIAGNOSIS — Z471 Aftercare following joint replacement surgery: Secondary | ICD-10-CM | POA: Diagnosis not present

## 2024-04-13 DIAGNOSIS — N189 Chronic kidney disease, unspecified: Secondary | ICD-10-CM | POA: Diagnosis not present

## 2024-04-14 DIAGNOSIS — M1711 Unilateral primary osteoarthritis, right knee: Secondary | ICD-10-CM | POA: Diagnosis not present

## 2024-04-14 DIAGNOSIS — Z794 Long term (current) use of insulin: Secondary | ICD-10-CM | POA: Diagnosis not present

## 2024-04-14 DIAGNOSIS — N189 Chronic kidney disease, unspecified: Secondary | ICD-10-CM | POA: Diagnosis not present

## 2024-04-14 DIAGNOSIS — E1142 Type 2 diabetes mellitus with diabetic polyneuropathy: Secondary | ICD-10-CM | POA: Diagnosis not present

## 2024-04-14 DIAGNOSIS — Z5189 Encounter for other specified aftercare: Secondary | ICD-10-CM | POA: Diagnosis not present

## 2024-04-14 DIAGNOSIS — I129 Hypertensive chronic kidney disease with stage 1 through stage 4 chronic kidney disease, or unspecified chronic kidney disease: Secondary | ICD-10-CM | POA: Diagnosis not present

## 2024-04-14 DIAGNOSIS — E1122 Type 2 diabetes mellitus with diabetic chronic kidney disease: Secondary | ICD-10-CM | POA: Diagnosis not present

## 2024-04-14 DIAGNOSIS — Z471 Aftercare following joint replacement surgery: Secondary | ICD-10-CM | POA: Diagnosis not present

## 2024-04-15 DIAGNOSIS — E1122 Type 2 diabetes mellitus with diabetic chronic kidney disease: Secondary | ICD-10-CM | POA: Diagnosis not present

## 2024-04-15 DIAGNOSIS — Z471 Aftercare following joint replacement surgery: Secondary | ICD-10-CM | POA: Diagnosis not present

## 2024-04-15 DIAGNOSIS — E1142 Type 2 diabetes mellitus with diabetic polyneuropathy: Secondary | ICD-10-CM | POA: Diagnosis not present

## 2024-04-15 DIAGNOSIS — I129 Hypertensive chronic kidney disease with stage 1 through stage 4 chronic kidney disease, or unspecified chronic kidney disease: Secondary | ICD-10-CM | POA: Diagnosis not present

## 2024-04-15 DIAGNOSIS — Z794 Long term (current) use of insulin: Secondary | ICD-10-CM | POA: Diagnosis not present

## 2024-04-15 DIAGNOSIS — N189 Chronic kidney disease, unspecified: Secondary | ICD-10-CM | POA: Diagnosis not present

## 2024-04-18 DIAGNOSIS — N189 Chronic kidney disease, unspecified: Secondary | ICD-10-CM | POA: Diagnosis not present

## 2024-04-18 DIAGNOSIS — E1142 Type 2 diabetes mellitus with diabetic polyneuropathy: Secondary | ICD-10-CM | POA: Diagnosis not present

## 2024-04-18 DIAGNOSIS — I129 Hypertensive chronic kidney disease with stage 1 through stage 4 chronic kidney disease, or unspecified chronic kidney disease: Secondary | ICD-10-CM | POA: Diagnosis not present

## 2024-04-18 DIAGNOSIS — Z794 Long term (current) use of insulin: Secondary | ICD-10-CM | POA: Diagnosis not present

## 2024-04-18 DIAGNOSIS — E1122 Type 2 diabetes mellitus with diabetic chronic kidney disease: Secondary | ICD-10-CM | POA: Diagnosis not present

## 2024-04-18 DIAGNOSIS — Z471 Aftercare following joint replacement surgery: Secondary | ICD-10-CM | POA: Diagnosis not present

## 2024-04-20 DIAGNOSIS — E1122 Type 2 diabetes mellitus with diabetic chronic kidney disease: Secondary | ICD-10-CM | POA: Diagnosis not present

## 2024-04-20 DIAGNOSIS — Z794 Long term (current) use of insulin: Secondary | ICD-10-CM | POA: Diagnosis not present

## 2024-04-20 DIAGNOSIS — N189 Chronic kidney disease, unspecified: Secondary | ICD-10-CM | POA: Diagnosis not present

## 2024-04-20 DIAGNOSIS — I129 Hypertensive chronic kidney disease with stage 1 through stage 4 chronic kidney disease, or unspecified chronic kidney disease: Secondary | ICD-10-CM | POA: Diagnosis not present

## 2024-04-20 DIAGNOSIS — E1142 Type 2 diabetes mellitus with diabetic polyneuropathy: Secondary | ICD-10-CM | POA: Diagnosis not present

## 2024-04-20 DIAGNOSIS — Z471 Aftercare following joint replacement surgery: Secondary | ICD-10-CM | POA: Diagnosis not present

## 2024-04-21 DIAGNOSIS — N189 Chronic kidney disease, unspecified: Secondary | ICD-10-CM | POA: Diagnosis not present

## 2024-04-21 DIAGNOSIS — E1122 Type 2 diabetes mellitus with diabetic chronic kidney disease: Secondary | ICD-10-CM | POA: Diagnosis not present

## 2024-04-21 DIAGNOSIS — I129 Hypertensive chronic kidney disease with stage 1 through stage 4 chronic kidney disease, or unspecified chronic kidney disease: Secondary | ICD-10-CM | POA: Diagnosis not present

## 2024-04-21 DIAGNOSIS — Z794 Long term (current) use of insulin: Secondary | ICD-10-CM | POA: Diagnosis not present

## 2024-04-21 DIAGNOSIS — Z471 Aftercare following joint replacement surgery: Secondary | ICD-10-CM | POA: Diagnosis not present

## 2024-04-21 DIAGNOSIS — E1142 Type 2 diabetes mellitus with diabetic polyneuropathy: Secondary | ICD-10-CM | POA: Diagnosis not present

## 2024-04-25 ENCOUNTER — Ambulatory Visit: Admitting: Urology

## 2024-04-25 DIAGNOSIS — E1169 Type 2 diabetes mellitus with other specified complication: Secondary | ICD-10-CM | POA: Diagnosis not present

## 2024-04-25 DIAGNOSIS — E1142 Type 2 diabetes mellitus with diabetic polyneuropathy: Secondary | ICD-10-CM | POA: Diagnosis not present

## 2024-04-25 DIAGNOSIS — M48061 Spinal stenosis, lumbar region without neurogenic claudication: Secondary | ICD-10-CM | POA: Diagnosis not present

## 2024-04-25 DIAGNOSIS — F419 Anxiety disorder, unspecified: Secondary | ICD-10-CM | POA: Diagnosis not present

## 2024-04-25 DIAGNOSIS — I479 Paroxysmal tachycardia, unspecified: Secondary | ICD-10-CM | POA: Diagnosis not present

## 2024-04-25 DIAGNOSIS — M5416 Radiculopathy, lumbar region: Secondary | ICD-10-CM | POA: Diagnosis not present

## 2024-04-25 DIAGNOSIS — Z794 Long term (current) use of insulin: Secondary | ICD-10-CM | POA: Diagnosis not present

## 2024-04-25 DIAGNOSIS — N3281 Overactive bladder: Secondary | ICD-10-CM | POA: Insufficient documentation

## 2024-04-25 DIAGNOSIS — Z96652 Presence of left artificial knee joint: Secondary | ICD-10-CM | POA: Diagnosis not present

## 2024-04-25 DIAGNOSIS — G4733 Obstructive sleep apnea (adult) (pediatric): Secondary | ICD-10-CM | POA: Diagnosis not present

## 2024-04-25 DIAGNOSIS — Z960 Presence of urogenital implants: Secondary | ICD-10-CM | POA: Diagnosis not present

## 2024-04-25 DIAGNOSIS — N189 Chronic kidney disease, unspecified: Secondary | ICD-10-CM | POA: Diagnosis not present

## 2024-04-25 DIAGNOSIS — E662 Morbid (severe) obesity with alveolar hypoventilation: Secondary | ICD-10-CM | POA: Diagnosis not present

## 2024-04-25 DIAGNOSIS — G609 Hereditary and idiopathic neuropathy, unspecified: Secondary | ICD-10-CM | POA: Diagnosis not present

## 2024-04-25 DIAGNOSIS — Z9981 Dependence on supplemental oxygen: Secondary | ICD-10-CM | POA: Diagnosis not present

## 2024-04-25 DIAGNOSIS — N3941 Urge incontinence: Secondary | ICD-10-CM | POA: Insufficient documentation

## 2024-04-25 DIAGNOSIS — E039 Hypothyroidism, unspecified: Secondary | ICD-10-CM | POA: Diagnosis not present

## 2024-04-25 DIAGNOSIS — J9602 Acute respiratory failure with hypercapnia: Secondary | ICD-10-CM | POA: Diagnosis not present

## 2024-04-25 DIAGNOSIS — I129 Hypertensive chronic kidney disease with stage 1 through stage 4 chronic kidney disease, or unspecified chronic kidney disease: Secondary | ICD-10-CM | POA: Diagnosis not present

## 2024-04-25 DIAGNOSIS — D649 Anemia, unspecified: Secondary | ICD-10-CM | POA: Diagnosis not present

## 2024-04-25 DIAGNOSIS — Z471 Aftercare following joint replacement surgery: Secondary | ICD-10-CM | POA: Diagnosis not present

## 2024-04-25 DIAGNOSIS — E1122 Type 2 diabetes mellitus with diabetic chronic kidney disease: Secondary | ICD-10-CM | POA: Diagnosis not present

## 2024-04-25 DIAGNOSIS — Z556 Problems related to health literacy: Secondary | ICD-10-CM | POA: Diagnosis not present

## 2024-04-25 DIAGNOSIS — Z7984 Long term (current) use of oral hypoglycemic drugs: Secondary | ICD-10-CM | POA: Diagnosis not present

## 2024-04-25 NOTE — Progress Notes (Deleted)
 Name: Kathryn Ryan DOB: July 20, 1941 MRN: [id number]  History of Present Illness: Ms. Kathryn Ryan is a 83 y.o. female who presents today for follow up visit at [facility] [place]. She resides at *** and is accompanied by ***, who assists with providing history due to patient's ***dementia ***cognitive impairment. GU History includes: 1. OAB with urinary urge incontinence. 2. Recurrent UTIs.  At initial Urology visit with Dr. Claretta Croft on 02/10/2024: - Seen "for evaluation of pelvic pain and urinary incontinence. She was having urinary incontinence using 5-6 diapers per day. She had a foley placed 6 weeks ago and she had been having pelvic pain so severe she presented to the ER. She has a constant urge to urinate. She was started on mirabegron  50mg  which improved her bladder spasms. She has had 3 UTIs in the past 5 weeks. She is scheduled for knee replacement the end of March".  - Pelvic pain thought to be related to bladder spasms. - The plan was: Continue mirabegron  50mg , followup 1 week for a voiding trial.   Since last visit: > 02/15/2024: Urology nurse visit. Passed voiding trial. Catheter discontinued.   > 02/22/2024: Urology nurse visit. PVR = 38 ml.  > 03/29/2024: Dr. Claretta Croft prescribed Vesicare  5 mg daily for dual therapy with Myrbetriq  (Mirabegron ) 50 mg daily due to patient report of ongoing incontinence.   Today: She reports ***  She {Actions; denies-reports:120008} symptomatic improvement since starting {Blank multiple:19197::"Vesicare  (Solifenacin )","Ditropan (Oxybutynin)","Detrol (Tolterodine)","Urispas (Flavoxate)","Toviaz (Fesoterodine)","Sanctura (Trospium)","Enablex (Darifenacin)","Myrbetriq  (Mirabegron )","Gemtesa (Vibegron)"} *** mg daily.  She reports {Blank multiple:19197::"improved","persistent / unchanged"} urinary ***frequency, ***nocturia, ***urgency, and ***urge incontinence. Voiding ***x/day and ***x/night on average. Leaking ***x/day on average; using ***  ***pads / ***diapers per day on average.  She {Actions; denies-reports:120008} significant caffeine intake (*** caffeinated beverages per day on average).  She {Actions; denies-reports:120008} dysuria, gross hematuria, straining to void, or sensations of incomplete emptying.  Medications: Current Outpatient Medications  Medication Sig Dispense Refill   atorvastatin  (LIPITOR) 10 MG tablet Take 10 mg by mouth every morning.     BYDUREON BCISE 2 MG/0.85ML AUIJ Inject 2 mg into the skin every Thursday.     chlorhexidine  (HIBICLENS ) 4 % external liquid Apply 15 mLs (1 Application total) topically as directed for 30 doses. Use as directed daily for 5 days every other week for 6 weeks. 946 mL 1   cholecalciferol (VITAMIN D3) 25 MCG (1000 UNIT) tablet Take 2,000 Units by mouth every morning.     Cranberry 200 MG CAPS Take 200 mg by mouth in the morning and at bedtime.     DULoxetine  (CYMBALTA ) 60 MG capsule Take 60 mg by mouth 2 (two) times daily.     LANTUS  SOLOSTAR 100 UNIT/ML Solostar Pen Inject 120 Units into the skin at bedtime.     levothyroxine  (SYNTHROID , LEVOTHROID) 150 MCG tablet Take 150 mcg by mouth daily before breakfast.     Melatonin 12 MG TABS Take 12 mg by mouth at bedtime.     metFORMIN (GLUCOPHAGE-XR) 500 MG 24 hr tablet Take 500 mg by mouth daily.     methocarbamol  (ROBAXIN ) 500 MG tablet Take 1 tablet (500 mg total) by mouth every 6 (six) hours as needed for muscle spasms. 40 tablet 0   nitrofurantoin (MACRODANTIN) 100 MG capsule Take 100 mg by mouth 2 (two) times daily.     omeprazole (PRILOSEC) 20 MG capsule Take 20 mg by mouth every morning.     ondansetron  (ZOFRAN ) 4 MG tablet Take 4 mg by mouth every  8 (eight) hours as needed for nausea or vomiting.     ondansetron  (ZOFRAN ) 4 MG tablet Take 1 tablet (4 mg total) by mouth every 6 (six) hours as needed for nausea. 20 tablet 0   oxyCODONE  (OXY IR/ROXICODONE ) 5 MG immediate release tablet Take 1-2 tablets (5-10 mg total) by  mouth every 6 (six) hours as needed for severe pain (pain score 7-10). 42 tablet 0   solifenacin  (VESICARE ) 5 MG tablet Take 1 tablet (5 mg total) by mouth daily. 30 tablet 1   traMADol  (ULTRAM ) 50 MG tablet Take 1-2 tablets (50-100 mg total) by mouth every 6 (six) hours as needed for moderate pain (pain score 4-6). 40 tablet 0   traZODone (DESYREL) 100 MG tablet Take 100 mg by mouth at bedtime.     No current facility-administered medications for this visit.    Allergies: Allergies  Allergen Reactions   Sulfa Antibiotics Itching    Past Medical History:  Diagnosis Date   Anxiety    Diabetes (HCC) 08/04/2017   Diabetes mellitus without complication (HCC)    new onset diabetic    GERD (gastroesophageal reflux disease)    H/O hiatal hernia    Hypertension    Hypothyroidism    Lumbar radiculopathy    Neuropathy    non related to DM-not sure why   Neuropathy    non DM related   Spinal stenosis of lumbar region    Past Surgical History:  Procedure Laterality Date   ABDOMINAL HYSTERECTOMY     BACK SURGERY     BREAST SURGERY     bilateral lumpectomies   BUNIONECTOMY     bilateral   CHOLECYSTECTOMY     HARDWARE REMOVAL Left 04/26/2014   Procedure: HARDWARE REMOVAL LEFT ANKLE;  Surgeon: Amada Backer, MD;  Location: [facility] OR;  Service: Orthopedics;  Laterality: Left;   I & D EXTREMITY Left 04/26/2014   Procedure: IRRIGATION AND DEBRIDEMENT LEFT ANKLE;  Surgeon: Amada Backer, MD;  Location: [facility] OR;  Service: Orthopedics;  Laterality: Left;   IRRIGATION AND DEBRIDEMENT ABSCESS Left 04/26/2014   LEFT OOPHORECTOMY     PERIPHERALLY INSERTED CENTRAL CATHETER INSERTION  04/26/2014   rt upper arm   TONSILLECTOMY     6 yrs   TOTAL KNEE ARTHROPLASTY Left 03/07/2024   Procedure: ARTHROPLASTY, KNEE, TOTAL;  Surgeon: Liliane Rei, MD;  Location: [facility] ORS;  Service: Orthopedics;  Laterality: Left;   Family History  Problem Relation Age of Onset   Breast cancer Mother    Heart disease Father         PPM   Social History   Socioeconomic History   Marital status: Widowed    Spouse name: Not on file   Number of children: Not on file   Years of education: Not on file   Highest education level: Not on file  Occupational History   Not on file  Tobacco Use   Smoking status: Former    Current packs/day: 0.00    Average packs/day: 2.0 packs/day for 26.2 years (52.4 ttl pk-yrs)    Types: Cigarettes    Start date: 12/17/1968    Quit date: 02/23/1995    Years since quitting: 29.1   Smokeless tobacco: Never  Vaping Use   Vaping status: Never Used  Substance and Sexual Activity   Alcohol  use: No   Drug use: No   Sexual activity: Not Currently  Other Topics Concern   Not on file  Social History Narrative   Not on file  Social Drivers of Corporate investment banker Strain: Low Risk  (11/05/2021)   Received from [facility], [facility]   Overall Financial Resource Strain (CARDIA)    Difficulty of Paying Living Expenses: Not hard at all  Food Insecurity: No Food Insecurity (03/07/2024)   Hunger Vital Sign    Worried About Running Out of Food in the Last Year: Never true    Ran Out of Food in the Last Year: Never true  Transportation Needs: No Transportation Needs (03/07/2024)   PRAPARE - Administrator, Civil Service (Medical): No    Lack of Transportation (Non-Medical): No  Physical Activity: Not on file  Stress: No Stress Concern Present (11/03/2021)   Received from [facility], [facility] Occupational [facility] - Occupational Stress Questionnaire    Feeling of Stress : Only a little  Social Connections: Unknown (03/07/2024)   Social Connection and Isolation Panel [NHANES]    Frequency of Communication with Friends and Family: Three times a week    Frequency of Social Gatherings with Friends and Family: Three times a week    Attends Religious Services: Never    Active Member of Clubs or Organizations: Yes    Attends Tax inspector Meetings: More than 4 times per year    Marital Status: Patient declined  Intimate Partner Violence: Not At Risk (03/07/2024)   Humiliation, Afraid, Rape, and Kick questionnaire    Fear of Current or Ex-Partner: No    Emotionally Abused: No    Physically Abused: No    Sexually Abused: No    Review of Systems Constitutional: Patient denies any unintentional weight loss or change in strength lntegumentary: Patient denies any rashes or pruritus Eyes: Patient {Actions; denies-reports:120008} dry eyes ENT: Patient {Actions; denies-reports:120008} dry mouth Cardiovascular: Patient denies chest pain or syncope Respiratory: Patient denies shortness of breath Gastrointestinal: ***Patient {Actions; denies-reports:120008} ***nausea, ***vomiting, ***constipation, ***diarrhea ***As per HPI Musculoskeletal: Patient denies muscle cramps or weakness Neurologic: Patient denies convulsions or seizures Allergic/Immunologic: Patient denies recent allergic reaction(s) Hematologic/Lymphatic: Patient denies bleeding tendencies Endocrine: Patient denies heat/cold intolerance  GU: As per HPI.  OBJECTIVE There were no vitals filed for this visit. There is no height or weight on file to calculate BMI.  Physical Examination Constitutional: No obvious distress; patient is non-toxic appearing  Cardiovascular: No visible lower extremity edema.  Respiratory: The patient does not have audible wheezing/stridor; respirations do not appear labored  Gastrointestinal: Abdomen non-distended Musculoskeletal: Normal ROM of UEs  Skin: No obvious rashes/open sores  Neurologic: CN 2-12 grossly intact Psychiatric: Answered questions appropriately with normal affect  Hematologic/Lymphatic/Immunologic: No obvious bruises or sites of spontaneous bleeding  UA: ***negative ***positive for *** leukocytes, *** blood, ***nitrites Urine microscopy: *** WBC/hpf, *** RBC/hpf, *** bacteria ***otherwise  unremarkable ***glucosuria (secondary to ***Jardiance ***Farxiga use)  PVR: *** ml  ASSESSMENT No diagnosis found.  We discussed the symptoms of overactive bladder (OAB), which include urinary urgency, frequency, nocturia, with or without urge incontinence.  While we may not know the exact etiology of OAB, several risk factors can be identified.  - Patient's neurogenic risk factors: ***T2DM ***with neuropathy, ***nicotine use, ***spinal stenosis, ***prior stroke, ***dementia.  - Patient's exacerbating factors include: ***diuretic use, ***caffeine intake, ***glucosuria (due to ***Jardiance / ***Farxiga use), ***ambulatory dysfunction (functional incontinence).   We discussed the following management options in detail including potential benefits, risks, and side effects: Behavioral therapy: Modify fluid intake Minimize / avoid bladder irritants (such as caffeine, spicy  foods, acidic foods, alcohol ) Bladder retraining / timed voiding Double voiding Medication(s): ***- We discussed potential side effects of anticholinergic medications such as urinary retention, dry eyes, dry mouth, constipation, confusion, cognitive impairment / dementia.  ***- Not a safe candidate for anticholinergic medications due to risk for side effects based on patient's age, comorbidities, and pre-existing ***dry mouth ***dry eyes ***constipation ***dementia ***Parkinsons disease ***MS.  ***- Beta-3 agonist medications: We discussed potential side effects of beta-3 agonist medications such as urinary retention and (infrequently) elevated blood pressure.  ***- Combination therapy with anticholinergic medication + beta-3 agonist medication. 3. For refractory cases: PTNS (posterior tibial nerve stimulation) ***Not a safe candidate for PTNS due to ***bleeding disorder, ***anticoagulant use, ***pregnancy, ***pacemaker, ***implanted cardiac defibrillator (ICD), ***neuropathy / nerve damage / nerve conduction disorder,  ***lower extremity metal implant(s).  Sacral neuromodulation trial (Medtronic lnterStim or Axonics implant) Bladder Botox injections  ***Consider discussing possible alternatives to ***Jardiance ***Farxiga with prescribing provider. This medication causes excess sugar to be excreted into the urine. That can prompt the kidneys to put out more water  to dilute that sugar in the urine and it can also irritate the bladder lining, both of which may contribute to OAB symptoms (urinary frequency, urgency, and urge incontinence).  She decided to proceed with *** ***behavioral modifications including ***minimizing / avoiding caffeine intake and working on ***timed voiding / bladder retraining.  Will plan for follow up in *** weeks / *** months or sooner if needed. Pt verbalized understanding and agreement. All questions were answered.  PLAN Advised the following: ***. *** ***. Minimize / avoid caffeine intake. ***. Work on timed voiding / bladder retraining. ***. No follow-ups on file.  No orders of the defined types were placed in this encounter.   It has been explained that the patient is to follow regularly with their PCP in addition to all other providers involved in their care and to follow instructions provided by these respective offices. Patient advised to contact urology clinic if any urologic-pertaining questions, concerns, new symptoms or problems arise in the interim period.  There are no Patient Instructions on file for this visit.  Electronically signed by:  Lauretta Ponto, FNP   04/25/24    10:01 AM

## 2024-04-26 DIAGNOSIS — I129 Hypertensive chronic kidney disease with stage 1 through stage 4 chronic kidney disease, or unspecified chronic kidney disease: Secondary | ICD-10-CM | POA: Diagnosis not present

## 2024-04-26 DIAGNOSIS — N189 Chronic kidney disease, unspecified: Secondary | ICD-10-CM | POA: Diagnosis not present

## 2024-04-26 DIAGNOSIS — Z794 Long term (current) use of insulin: Secondary | ICD-10-CM | POA: Diagnosis not present

## 2024-04-26 DIAGNOSIS — E1122 Type 2 diabetes mellitus with diabetic chronic kidney disease: Secondary | ICD-10-CM | POA: Diagnosis not present

## 2024-04-26 DIAGNOSIS — Z471 Aftercare following joint replacement surgery: Secondary | ICD-10-CM | POA: Diagnosis not present

## 2024-04-26 DIAGNOSIS — E1142 Type 2 diabetes mellitus with diabetic polyneuropathy: Secondary | ICD-10-CM | POA: Diagnosis not present

## 2024-04-28 DIAGNOSIS — E7849 Other hyperlipidemia: Secondary | ICD-10-CM | POA: Diagnosis not present

## 2024-04-28 DIAGNOSIS — D559 Anemia due to enzyme disorder, unspecified: Secondary | ICD-10-CM | POA: Diagnosis not present

## 2024-04-28 DIAGNOSIS — D529 Folate deficiency anemia, unspecified: Secondary | ICD-10-CM | POA: Diagnosis not present

## 2024-04-28 DIAGNOSIS — D519 Vitamin B12 deficiency anemia, unspecified: Secondary | ICD-10-CM | POA: Diagnosis not present

## 2024-04-28 DIAGNOSIS — E039 Hypothyroidism, unspecified: Secondary | ICD-10-CM | POA: Diagnosis not present

## 2024-04-28 DIAGNOSIS — Z1321 Encounter for screening for nutritional disorder: Secondary | ICD-10-CM | POA: Diagnosis not present

## 2024-04-28 DIAGNOSIS — E1142 Type 2 diabetes mellitus with diabetic polyneuropathy: Secondary | ICD-10-CM | POA: Diagnosis not present

## 2024-04-29 DIAGNOSIS — E1142 Type 2 diabetes mellitus with diabetic polyneuropathy: Secondary | ICD-10-CM | POA: Diagnosis not present

## 2024-04-29 DIAGNOSIS — N189 Chronic kidney disease, unspecified: Secondary | ICD-10-CM | POA: Diagnosis not present

## 2024-04-29 DIAGNOSIS — E1122 Type 2 diabetes mellitus with diabetic chronic kidney disease: Secondary | ICD-10-CM | POA: Diagnosis not present

## 2024-04-29 DIAGNOSIS — I129 Hypertensive chronic kidney disease with stage 1 through stage 4 chronic kidney disease, or unspecified chronic kidney disease: Secondary | ICD-10-CM | POA: Diagnosis not present

## 2024-04-29 DIAGNOSIS — Z471 Aftercare following joint replacement surgery: Secondary | ICD-10-CM | POA: Diagnosis not present

## 2024-04-29 DIAGNOSIS — Z794 Long term (current) use of insulin: Secondary | ICD-10-CM | POA: Diagnosis not present

## 2024-05-03 DIAGNOSIS — E1142 Type 2 diabetes mellitus with diabetic polyneuropathy: Secondary | ICD-10-CM | POA: Diagnosis not present

## 2024-05-03 DIAGNOSIS — Z6834 Body mass index (BMI) 34.0-34.9, adult: Secondary | ICD-10-CM | POA: Diagnosis not present

## 2024-05-03 DIAGNOSIS — E039 Hypothyroidism, unspecified: Secondary | ICD-10-CM | POA: Diagnosis not present

## 2024-05-03 DIAGNOSIS — G622 Polyneuropathy due to other toxic agents: Secondary | ICD-10-CM | POA: Diagnosis not present

## 2024-05-03 DIAGNOSIS — D519 Vitamin B12 deficiency anemia, unspecified: Secondary | ICD-10-CM | POA: Diagnosis not present

## 2024-05-04 DIAGNOSIS — N189 Chronic kidney disease, unspecified: Secondary | ICD-10-CM | POA: Diagnosis not present

## 2024-05-04 DIAGNOSIS — I129 Hypertensive chronic kidney disease with stage 1 through stage 4 chronic kidney disease, or unspecified chronic kidney disease: Secondary | ICD-10-CM | POA: Diagnosis not present

## 2024-05-04 DIAGNOSIS — Z794 Long term (current) use of insulin: Secondary | ICD-10-CM | POA: Diagnosis not present

## 2024-05-04 DIAGNOSIS — E1122 Type 2 diabetes mellitus with diabetic chronic kidney disease: Secondary | ICD-10-CM | POA: Diagnosis not present

## 2024-05-04 DIAGNOSIS — Z471 Aftercare following joint replacement surgery: Secondary | ICD-10-CM | POA: Diagnosis not present

## 2024-05-04 DIAGNOSIS — E1142 Type 2 diabetes mellitus with diabetic polyneuropathy: Secondary | ICD-10-CM | POA: Diagnosis not present

## 2024-05-06 DIAGNOSIS — N189 Chronic kidney disease, unspecified: Secondary | ICD-10-CM | POA: Diagnosis not present

## 2024-05-06 DIAGNOSIS — E1122 Type 2 diabetes mellitus with diabetic chronic kidney disease: Secondary | ICD-10-CM | POA: Diagnosis not present

## 2024-05-06 DIAGNOSIS — E1142 Type 2 diabetes mellitus with diabetic polyneuropathy: Secondary | ICD-10-CM | POA: Diagnosis not present

## 2024-05-06 DIAGNOSIS — Z794 Long term (current) use of insulin: Secondary | ICD-10-CM | POA: Diagnosis not present

## 2024-05-06 DIAGNOSIS — I129 Hypertensive chronic kidney disease with stage 1 through stage 4 chronic kidney disease, or unspecified chronic kidney disease: Secondary | ICD-10-CM | POA: Diagnosis not present

## 2024-05-06 DIAGNOSIS — Z471 Aftercare following joint replacement surgery: Secondary | ICD-10-CM | POA: Diagnosis not present

## 2024-05-09 DIAGNOSIS — L851 Acquired keratosis [keratoderma] palmaris et plantaris: Secondary | ICD-10-CM | POA: Diagnosis not present

## 2024-05-09 DIAGNOSIS — E1142 Type 2 diabetes mellitus with diabetic polyneuropathy: Secondary | ICD-10-CM | POA: Diagnosis not present

## 2024-05-09 DIAGNOSIS — Z471 Aftercare following joint replacement surgery: Secondary | ICD-10-CM | POA: Diagnosis not present

## 2024-05-09 DIAGNOSIS — E1122 Type 2 diabetes mellitus with diabetic chronic kidney disease: Secondary | ICD-10-CM | POA: Diagnosis not present

## 2024-05-09 DIAGNOSIS — N189 Chronic kidney disease, unspecified: Secondary | ICD-10-CM | POA: Diagnosis not present

## 2024-05-09 DIAGNOSIS — Z794 Long term (current) use of insulin: Secondary | ICD-10-CM | POA: Diagnosis not present

## 2024-05-09 DIAGNOSIS — B351 Tinea unguium: Secondary | ICD-10-CM | POA: Diagnosis not present

## 2024-05-09 DIAGNOSIS — I129 Hypertensive chronic kidney disease with stage 1 through stage 4 chronic kidney disease, or unspecified chronic kidney disease: Secondary | ICD-10-CM | POA: Diagnosis not present

## 2024-05-11 DIAGNOSIS — N189 Chronic kidney disease, unspecified: Secondary | ICD-10-CM | POA: Diagnosis not present

## 2024-05-11 DIAGNOSIS — Z471 Aftercare following joint replacement surgery: Secondary | ICD-10-CM | POA: Diagnosis not present

## 2024-05-11 DIAGNOSIS — E1142 Type 2 diabetes mellitus with diabetic polyneuropathy: Secondary | ICD-10-CM | POA: Diagnosis not present

## 2024-05-11 DIAGNOSIS — Z794 Long term (current) use of insulin: Secondary | ICD-10-CM | POA: Diagnosis not present

## 2024-05-11 DIAGNOSIS — E1122 Type 2 diabetes mellitus with diabetic chronic kidney disease: Secondary | ICD-10-CM | POA: Diagnosis not present

## 2024-05-11 DIAGNOSIS — I129 Hypertensive chronic kidney disease with stage 1 through stage 4 chronic kidney disease, or unspecified chronic kidney disease: Secondary | ICD-10-CM | POA: Diagnosis not present

## 2024-05-13 DIAGNOSIS — E1142 Type 2 diabetes mellitus with diabetic polyneuropathy: Secondary | ICD-10-CM | POA: Diagnosis not present

## 2024-05-13 DIAGNOSIS — Z794 Long term (current) use of insulin: Secondary | ICD-10-CM | POA: Diagnosis not present

## 2024-05-13 DIAGNOSIS — Z471 Aftercare following joint replacement surgery: Secondary | ICD-10-CM | POA: Diagnosis not present

## 2024-05-13 DIAGNOSIS — N189 Chronic kidney disease, unspecified: Secondary | ICD-10-CM | POA: Diagnosis not present

## 2024-05-13 DIAGNOSIS — E1122 Type 2 diabetes mellitus with diabetic chronic kidney disease: Secondary | ICD-10-CM | POA: Diagnosis not present

## 2024-05-13 DIAGNOSIS — I129 Hypertensive chronic kidney disease with stage 1 through stage 4 chronic kidney disease, or unspecified chronic kidney disease: Secondary | ICD-10-CM | POA: Diagnosis not present

## 2024-05-16 DIAGNOSIS — E1122 Type 2 diabetes mellitus with diabetic chronic kidney disease: Secondary | ICD-10-CM | POA: Diagnosis not present

## 2024-05-16 DIAGNOSIS — E1142 Type 2 diabetes mellitus with diabetic polyneuropathy: Secondary | ICD-10-CM | POA: Diagnosis not present

## 2024-05-16 DIAGNOSIS — N189 Chronic kidney disease, unspecified: Secondary | ICD-10-CM | POA: Diagnosis not present

## 2024-05-16 DIAGNOSIS — Z471 Aftercare following joint replacement surgery: Secondary | ICD-10-CM | POA: Diagnosis not present

## 2024-05-16 DIAGNOSIS — Z794 Long term (current) use of insulin: Secondary | ICD-10-CM | POA: Diagnosis not present

## 2024-05-16 DIAGNOSIS — I129 Hypertensive chronic kidney disease with stage 1 through stage 4 chronic kidney disease, or unspecified chronic kidney disease: Secondary | ICD-10-CM | POA: Diagnosis not present

## 2024-05-17 DIAGNOSIS — Z794 Long term (current) use of insulin: Secondary | ICD-10-CM | POA: Diagnosis not present

## 2024-05-17 DIAGNOSIS — E1122 Type 2 diabetes mellitus with diabetic chronic kidney disease: Secondary | ICD-10-CM | POA: Diagnosis not present

## 2024-05-17 DIAGNOSIS — Z471 Aftercare following joint replacement surgery: Secondary | ICD-10-CM | POA: Diagnosis not present

## 2024-05-17 DIAGNOSIS — E1142 Type 2 diabetes mellitus with diabetic polyneuropathy: Secondary | ICD-10-CM | POA: Diagnosis not present

## 2024-05-17 DIAGNOSIS — I129 Hypertensive chronic kidney disease with stage 1 through stage 4 chronic kidney disease, or unspecified chronic kidney disease: Secondary | ICD-10-CM | POA: Diagnosis not present

## 2024-05-17 DIAGNOSIS — N189 Chronic kidney disease, unspecified: Secondary | ICD-10-CM | POA: Diagnosis not present

## 2024-05-20 DIAGNOSIS — E1122 Type 2 diabetes mellitus with diabetic chronic kidney disease: Secondary | ICD-10-CM | POA: Diagnosis not present

## 2024-05-20 DIAGNOSIS — Z794 Long term (current) use of insulin: Secondary | ICD-10-CM | POA: Diagnosis not present

## 2024-05-20 DIAGNOSIS — Z471 Aftercare following joint replacement surgery: Secondary | ICD-10-CM | POA: Diagnosis not present

## 2024-05-20 DIAGNOSIS — E1142 Type 2 diabetes mellitus with diabetic polyneuropathy: Secondary | ICD-10-CM | POA: Diagnosis not present

## 2024-05-20 DIAGNOSIS — I129 Hypertensive chronic kidney disease with stage 1 through stage 4 chronic kidney disease, or unspecified chronic kidney disease: Secondary | ICD-10-CM | POA: Diagnosis not present

## 2024-05-20 DIAGNOSIS — N189 Chronic kidney disease, unspecified: Secondary | ICD-10-CM | POA: Diagnosis not present

## 2024-05-23 DIAGNOSIS — E1142 Type 2 diabetes mellitus with diabetic polyneuropathy: Secondary | ICD-10-CM | POA: Diagnosis not present

## 2024-05-23 DIAGNOSIS — L03115 Cellulitis of right lower limb: Secondary | ICD-10-CM | POA: Diagnosis not present

## 2024-05-23 DIAGNOSIS — Z6834 Body mass index (BMI) 34.0-34.9, adult: Secondary | ICD-10-CM | POA: Diagnosis not present

## 2024-05-24 DIAGNOSIS — Z794 Long term (current) use of insulin: Secondary | ICD-10-CM | POA: Diagnosis not present

## 2024-05-24 DIAGNOSIS — E1122 Type 2 diabetes mellitus with diabetic chronic kidney disease: Secondary | ICD-10-CM | POA: Diagnosis not present

## 2024-05-24 DIAGNOSIS — N189 Chronic kidney disease, unspecified: Secondary | ICD-10-CM | POA: Diagnosis not present

## 2024-05-24 DIAGNOSIS — I129 Hypertensive chronic kidney disease with stage 1 through stage 4 chronic kidney disease, or unspecified chronic kidney disease: Secondary | ICD-10-CM | POA: Diagnosis not present

## 2024-05-24 DIAGNOSIS — E1142 Type 2 diabetes mellitus with diabetic polyneuropathy: Secondary | ICD-10-CM | POA: Diagnosis not present

## 2024-05-24 DIAGNOSIS — Z471 Aftercare following joint replacement surgery: Secondary | ICD-10-CM | POA: Diagnosis not present

## 2024-05-25 DIAGNOSIS — G609 Hereditary and idiopathic neuropathy, unspecified: Secondary | ICD-10-CM | POA: Diagnosis not present

## 2024-05-25 DIAGNOSIS — E1169 Type 2 diabetes mellitus with other specified complication: Secondary | ICD-10-CM | POA: Diagnosis not present

## 2024-05-25 DIAGNOSIS — E1122 Type 2 diabetes mellitus with diabetic chronic kidney disease: Secondary | ICD-10-CM | POA: Diagnosis not present

## 2024-05-25 DIAGNOSIS — E1142 Type 2 diabetes mellitus with diabetic polyneuropathy: Secondary | ICD-10-CM | POA: Diagnosis not present

## 2024-05-25 DIAGNOSIS — Z960 Presence of urogenital implants: Secondary | ICD-10-CM | POA: Diagnosis not present

## 2024-05-25 DIAGNOSIS — M48061 Spinal stenosis, lumbar region without neurogenic claudication: Secondary | ICD-10-CM | POA: Diagnosis not present

## 2024-05-25 DIAGNOSIS — Z556 Problems related to health literacy: Secondary | ICD-10-CM | POA: Diagnosis not present

## 2024-05-25 DIAGNOSIS — Z794 Long term (current) use of insulin: Secondary | ICD-10-CM | POA: Diagnosis not present

## 2024-05-25 DIAGNOSIS — S99921D Unspecified injury of right foot, subsequent encounter: Secondary | ICD-10-CM | POA: Diagnosis not present

## 2024-05-25 DIAGNOSIS — D631 Anemia in chronic kidney disease: Secondary | ICD-10-CM | POA: Diagnosis not present

## 2024-05-25 DIAGNOSIS — Z9981 Dependence on supplemental oxygen: Secondary | ICD-10-CM | POA: Diagnosis not present

## 2024-05-25 DIAGNOSIS — I129 Hypertensive chronic kidney disease with stage 1 through stage 4 chronic kidney disease, or unspecified chronic kidney disease: Secondary | ICD-10-CM | POA: Diagnosis not present

## 2024-05-25 DIAGNOSIS — I479 Paroxysmal tachycardia, unspecified: Secondary | ICD-10-CM | POA: Diagnosis not present

## 2024-05-25 DIAGNOSIS — E662 Morbid (severe) obesity with alveolar hypoventilation: Secondary | ICD-10-CM | POA: Diagnosis not present

## 2024-05-25 DIAGNOSIS — Z96652 Presence of left artificial knee joint: Secondary | ICD-10-CM | POA: Diagnosis not present

## 2024-05-25 DIAGNOSIS — E039 Hypothyroidism, unspecified: Secondary | ICD-10-CM | POA: Diagnosis not present

## 2024-05-25 DIAGNOSIS — M5416 Radiculopathy, lumbar region: Secondary | ICD-10-CM | POA: Diagnosis not present

## 2024-05-25 DIAGNOSIS — Z7984 Long term (current) use of oral hypoglycemic drugs: Secondary | ICD-10-CM | POA: Diagnosis not present

## 2024-05-25 DIAGNOSIS — F419 Anxiety disorder, unspecified: Secondary | ICD-10-CM | POA: Diagnosis not present

## 2024-05-25 DIAGNOSIS — N189 Chronic kidney disease, unspecified: Secondary | ICD-10-CM | POA: Diagnosis not present

## 2024-05-30 DIAGNOSIS — E1142 Type 2 diabetes mellitus with diabetic polyneuropathy: Secondary | ICD-10-CM | POA: Diagnosis not present

## 2024-05-30 DIAGNOSIS — E1122 Type 2 diabetes mellitus with diabetic chronic kidney disease: Secondary | ICD-10-CM | POA: Diagnosis not present

## 2024-05-30 DIAGNOSIS — D631 Anemia in chronic kidney disease: Secondary | ICD-10-CM | POA: Diagnosis not present

## 2024-05-30 DIAGNOSIS — E1169 Type 2 diabetes mellitus with other specified complication: Secondary | ICD-10-CM | POA: Diagnosis not present

## 2024-05-30 DIAGNOSIS — I129 Hypertensive chronic kidney disease with stage 1 through stage 4 chronic kidney disease, or unspecified chronic kidney disease: Secondary | ICD-10-CM | POA: Diagnosis not present

## 2024-05-30 DIAGNOSIS — N189 Chronic kidney disease, unspecified: Secondary | ICD-10-CM | POA: Diagnosis not present

## 2024-06-03 DIAGNOSIS — M48061 Spinal stenosis, lumbar region without neurogenic claudication: Secondary | ICD-10-CM | POA: Diagnosis not present

## 2024-06-03 DIAGNOSIS — E1122 Type 2 diabetes mellitus with diabetic chronic kidney disease: Secondary | ICD-10-CM | POA: Diagnosis not present

## 2024-06-03 DIAGNOSIS — E1169 Type 2 diabetes mellitus with other specified complication: Secondary | ICD-10-CM | POA: Diagnosis not present

## 2024-06-03 DIAGNOSIS — M5416 Radiculopathy, lumbar region: Secondary | ICD-10-CM | POA: Diagnosis not present

## 2024-06-03 DIAGNOSIS — N189 Chronic kidney disease, unspecified: Secondary | ICD-10-CM | POA: Diagnosis not present

## 2024-06-03 DIAGNOSIS — I479 Paroxysmal tachycardia, unspecified: Secondary | ICD-10-CM | POA: Diagnosis not present

## 2024-06-03 DIAGNOSIS — I129 Hypertensive chronic kidney disease with stage 1 through stage 4 chronic kidney disease, or unspecified chronic kidney disease: Secondary | ICD-10-CM | POA: Diagnosis not present

## 2024-06-03 DIAGNOSIS — Z794 Long term (current) use of insulin: Secondary | ICD-10-CM | POA: Diagnosis not present

## 2024-06-03 DIAGNOSIS — E1142 Type 2 diabetes mellitus with diabetic polyneuropathy: Secondary | ICD-10-CM | POA: Diagnosis not present

## 2024-06-03 DIAGNOSIS — D631 Anemia in chronic kidney disease: Secondary | ICD-10-CM | POA: Diagnosis not present

## 2024-06-03 DIAGNOSIS — E039 Hypothyroidism, unspecified: Secondary | ICD-10-CM | POA: Diagnosis not present

## 2024-06-03 DIAGNOSIS — E662 Morbid (severe) obesity with alveolar hypoventilation: Secondary | ICD-10-CM | POA: Diagnosis not present

## 2024-06-07 DIAGNOSIS — E1169 Type 2 diabetes mellitus with other specified complication: Secondary | ICD-10-CM | POA: Diagnosis not present

## 2024-06-07 DIAGNOSIS — D631 Anemia in chronic kidney disease: Secondary | ICD-10-CM | POA: Diagnosis not present

## 2024-06-07 DIAGNOSIS — I129 Hypertensive chronic kidney disease with stage 1 through stage 4 chronic kidney disease, or unspecified chronic kidney disease: Secondary | ICD-10-CM | POA: Diagnosis not present

## 2024-06-07 DIAGNOSIS — E1122 Type 2 diabetes mellitus with diabetic chronic kidney disease: Secondary | ICD-10-CM | POA: Diagnosis not present

## 2024-06-07 DIAGNOSIS — E1142 Type 2 diabetes mellitus with diabetic polyneuropathy: Secondary | ICD-10-CM | POA: Diagnosis not present

## 2024-06-07 DIAGNOSIS — N189 Chronic kidney disease, unspecified: Secondary | ICD-10-CM | POA: Diagnosis not present

## 2024-06-11 DIAGNOSIS — E1122 Type 2 diabetes mellitus with diabetic chronic kidney disease: Secondary | ICD-10-CM | POA: Diagnosis not present

## 2024-06-11 DIAGNOSIS — N189 Chronic kidney disease, unspecified: Secondary | ICD-10-CM | POA: Diagnosis not present

## 2024-06-11 DIAGNOSIS — D631 Anemia in chronic kidney disease: Secondary | ICD-10-CM | POA: Diagnosis not present

## 2024-06-11 DIAGNOSIS — E1142 Type 2 diabetes mellitus with diabetic polyneuropathy: Secondary | ICD-10-CM | POA: Diagnosis not present

## 2024-06-11 DIAGNOSIS — I129 Hypertensive chronic kidney disease with stage 1 through stage 4 chronic kidney disease, or unspecified chronic kidney disease: Secondary | ICD-10-CM | POA: Diagnosis not present

## 2024-06-11 DIAGNOSIS — E1169 Type 2 diabetes mellitus with other specified complication: Secondary | ICD-10-CM | POA: Diagnosis not present

## 2024-06-14 DIAGNOSIS — I129 Hypertensive chronic kidney disease with stage 1 through stage 4 chronic kidney disease, or unspecified chronic kidney disease: Secondary | ICD-10-CM | POA: Diagnosis not present

## 2024-06-14 DIAGNOSIS — N189 Chronic kidney disease, unspecified: Secondary | ICD-10-CM | POA: Diagnosis not present

## 2024-06-14 DIAGNOSIS — E1142 Type 2 diabetes mellitus with diabetic polyneuropathy: Secondary | ICD-10-CM | POA: Diagnosis not present

## 2024-06-14 DIAGNOSIS — E1122 Type 2 diabetes mellitus with diabetic chronic kidney disease: Secondary | ICD-10-CM | POA: Diagnosis not present

## 2024-06-14 DIAGNOSIS — E1169 Type 2 diabetes mellitus with other specified complication: Secondary | ICD-10-CM | POA: Diagnosis not present

## 2024-06-14 DIAGNOSIS — D631 Anemia in chronic kidney disease: Secondary | ICD-10-CM | POA: Diagnosis not present

## 2024-06-16 DIAGNOSIS — D631 Anemia in chronic kidney disease: Secondary | ICD-10-CM | POA: Diagnosis not present

## 2024-06-16 DIAGNOSIS — I129 Hypertensive chronic kidney disease with stage 1 through stage 4 chronic kidney disease, or unspecified chronic kidney disease: Secondary | ICD-10-CM | POA: Diagnosis not present

## 2024-06-16 DIAGNOSIS — E1169 Type 2 diabetes mellitus with other specified complication: Secondary | ICD-10-CM | POA: Diagnosis not present

## 2024-06-16 DIAGNOSIS — N189 Chronic kidney disease, unspecified: Secondary | ICD-10-CM | POA: Diagnosis not present

## 2024-06-16 DIAGNOSIS — E1122 Type 2 diabetes mellitus with diabetic chronic kidney disease: Secondary | ICD-10-CM | POA: Diagnosis not present

## 2024-06-16 DIAGNOSIS — E1142 Type 2 diabetes mellitus with diabetic polyneuropathy: Secondary | ICD-10-CM | POA: Diagnosis not present

## 2024-07-18 DIAGNOSIS — L97511 Non-pressure chronic ulcer of other part of right foot limited to breakdown of skin: Secondary | ICD-10-CM | POA: Diagnosis not present

## 2024-07-18 DIAGNOSIS — B351 Tinea unguium: Secondary | ICD-10-CM | POA: Diagnosis not present

## 2024-07-18 DIAGNOSIS — E1142 Type 2 diabetes mellitus with diabetic polyneuropathy: Secondary | ICD-10-CM | POA: Diagnosis not present

## 2024-07-18 DIAGNOSIS — L851 Acquired keratosis [keratoderma] palmaris et plantaris: Secondary | ICD-10-CM | POA: Diagnosis not present

## 2024-07-25 DIAGNOSIS — K219 Gastro-esophageal reflux disease without esophagitis: Secondary | ICD-10-CM | POA: Diagnosis not present

## 2024-07-25 DIAGNOSIS — Z96652 Presence of left artificial knee joint: Secondary | ICD-10-CM | POA: Diagnosis not present

## 2024-07-25 DIAGNOSIS — E1142 Type 2 diabetes mellitus with diabetic polyneuropathy: Secondary | ICD-10-CM | POA: Diagnosis not present

## 2024-07-25 DIAGNOSIS — E559 Vitamin D deficiency, unspecified: Secondary | ICD-10-CM | POA: Diagnosis not present

## 2024-07-25 DIAGNOSIS — M51369 Other intervertebral disc degeneration, lumbar region without mention of lumbar back pain or lower extremity pain: Secondary | ICD-10-CM | POA: Diagnosis not present

## 2024-07-25 DIAGNOSIS — E039 Hypothyroidism, unspecified: Secondary | ICD-10-CM | POA: Diagnosis not present

## 2024-07-25 DIAGNOSIS — R32 Unspecified urinary incontinence: Secondary | ICD-10-CM | POA: Diagnosis not present

## 2024-07-25 DIAGNOSIS — J309 Allergic rhinitis, unspecified: Secondary | ICD-10-CM | POA: Diagnosis not present

## 2024-07-25 DIAGNOSIS — F5104 Psychophysiologic insomnia: Secondary | ICD-10-CM | POA: Diagnosis not present

## 2024-07-25 DIAGNOSIS — I10 Essential (primary) hypertension: Secondary | ICD-10-CM | POA: Diagnosis not present

## 2024-07-25 DIAGNOSIS — E782 Mixed hyperlipidemia: Secondary | ICD-10-CM | POA: Diagnosis not present

## 2024-08-05 DIAGNOSIS — E118 Type 2 diabetes mellitus with unspecified complications: Secondary | ICD-10-CM | POA: Diagnosis not present

## 2024-08-05 DIAGNOSIS — E782 Mixed hyperlipidemia: Secondary | ICD-10-CM | POA: Diagnosis not present

## 2024-08-05 DIAGNOSIS — E039 Hypothyroidism, unspecified: Secondary | ICD-10-CM | POA: Diagnosis not present

## 2024-08-05 DIAGNOSIS — I10 Essential (primary) hypertension: Secondary | ICD-10-CM | POA: Diagnosis not present

## 2024-08-18 DIAGNOSIS — M79604 Pain in right leg: Secondary | ICD-10-CM | POA: Diagnosis not present

## 2024-08-18 DIAGNOSIS — M545 Low back pain, unspecified: Secondary | ICD-10-CM | POA: Diagnosis not present

## 2024-08-18 DIAGNOSIS — Z5189 Encounter for other specified aftercare: Secondary | ICD-10-CM | POA: Diagnosis not present

## 2024-08-19 DIAGNOSIS — Z604 Social exclusion and rejection: Secondary | ICD-10-CM | POA: Diagnosis not present

## 2024-08-19 DIAGNOSIS — K760 Fatty (change of) liver, not elsewhere classified: Secondary | ICD-10-CM | POA: Diagnosis not present

## 2024-08-19 DIAGNOSIS — E1169 Type 2 diabetes mellitus with other specified complication: Secondary | ICD-10-CM | POA: Diagnosis not present

## 2024-08-19 DIAGNOSIS — Z6835 Body mass index (BMI) 35.0-35.9, adult: Secondary | ICD-10-CM | POA: Diagnosis not present

## 2024-08-19 DIAGNOSIS — G609 Hereditary and idiopathic neuropathy, unspecified: Secondary | ICD-10-CM | POA: Diagnosis not present

## 2024-08-19 DIAGNOSIS — Z7984 Long term (current) use of oral hypoglycemic drugs: Secondary | ICD-10-CM | POA: Diagnosis not present

## 2024-08-19 DIAGNOSIS — M1711 Unilateral primary osteoarthritis, right knee: Secondary | ICD-10-CM | POA: Diagnosis not present

## 2024-08-19 DIAGNOSIS — Z556 Problems related to health literacy: Secondary | ICD-10-CM | POA: Diagnosis not present

## 2024-08-19 DIAGNOSIS — M5416 Radiculopathy, lumbar region: Secondary | ICD-10-CM | POA: Diagnosis not present

## 2024-08-19 DIAGNOSIS — F419 Anxiety disorder, unspecified: Secondary | ICD-10-CM | POA: Diagnosis not present

## 2024-08-19 DIAGNOSIS — F321 Major depressive disorder, single episode, moderate: Secondary | ICD-10-CM | POA: Diagnosis not present

## 2024-08-19 DIAGNOSIS — Z87891 Personal history of nicotine dependence: Secondary | ICD-10-CM | POA: Diagnosis not present

## 2024-08-19 DIAGNOSIS — E662 Morbid (severe) obesity with alveolar hypoventilation: Secondary | ICD-10-CM | POA: Diagnosis not present

## 2024-08-19 DIAGNOSIS — I129 Hypertensive chronic kidney disease with stage 1 through stage 4 chronic kidney disease, or unspecified chronic kidney disease: Secondary | ICD-10-CM | POA: Diagnosis not present

## 2024-08-19 DIAGNOSIS — E039 Hypothyroidism, unspecified: Secondary | ICD-10-CM | POA: Diagnosis not present

## 2024-08-19 DIAGNOSIS — M48061 Spinal stenosis, lumbar region without neurogenic claudication: Secondary | ICD-10-CM | POA: Diagnosis not present

## 2024-08-19 DIAGNOSIS — L03115 Cellulitis of right lower limb: Secondary | ICD-10-CM | POA: Diagnosis not present

## 2024-08-19 DIAGNOSIS — D631 Anemia in chronic kidney disease: Secondary | ICD-10-CM | POA: Diagnosis not present

## 2024-08-19 DIAGNOSIS — E1122 Type 2 diabetes mellitus with diabetic chronic kidney disease: Secondary | ICD-10-CM | POA: Diagnosis not present

## 2024-08-19 DIAGNOSIS — N189 Chronic kidney disease, unspecified: Secondary | ICD-10-CM | POA: Diagnosis not present

## 2024-08-19 DIAGNOSIS — M810 Age-related osteoporosis without current pathological fracture: Secondary | ICD-10-CM | POA: Diagnosis not present

## 2024-08-19 DIAGNOSIS — Z7985 Long-term (current) use of injectable non-insulin antidiabetic drugs: Secondary | ICD-10-CM | POA: Diagnosis not present

## 2024-08-19 DIAGNOSIS — I951 Orthostatic hypotension: Secondary | ICD-10-CM | POA: Diagnosis not present

## 2024-08-19 DIAGNOSIS — S90921D Unspecified superficial injury of right foot, subsequent encounter: Secondary | ICD-10-CM | POA: Diagnosis not present

## 2024-08-19 DIAGNOSIS — Z96652 Presence of left artificial knee joint: Secondary | ICD-10-CM | POA: Diagnosis not present

## 2024-08-24 DIAGNOSIS — E1122 Type 2 diabetes mellitus with diabetic chronic kidney disease: Secondary | ICD-10-CM | POA: Diagnosis not present

## 2024-08-24 DIAGNOSIS — Z6835 Body mass index (BMI) 35.0-35.9, adult: Secondary | ICD-10-CM | POA: Diagnosis not present

## 2024-08-24 DIAGNOSIS — E662 Morbid (severe) obesity with alveolar hypoventilation: Secondary | ICD-10-CM | POA: Diagnosis not present

## 2024-08-24 DIAGNOSIS — L03115 Cellulitis of right lower limb: Secondary | ICD-10-CM | POA: Diagnosis not present

## 2024-08-24 DIAGNOSIS — N189 Chronic kidney disease, unspecified: Secondary | ICD-10-CM | POA: Diagnosis not present

## 2024-08-24 DIAGNOSIS — I129 Hypertensive chronic kidney disease with stage 1 through stage 4 chronic kidney disease, or unspecified chronic kidney disease: Secondary | ICD-10-CM | POA: Diagnosis not present

## 2024-08-26 DIAGNOSIS — L03115 Cellulitis of right lower limb: Secondary | ICD-10-CM | POA: Diagnosis not present

## 2024-08-26 DIAGNOSIS — E662 Morbid (severe) obesity with alveolar hypoventilation: Secondary | ICD-10-CM | POA: Diagnosis not present

## 2024-08-26 DIAGNOSIS — I129 Hypertensive chronic kidney disease with stage 1 through stage 4 chronic kidney disease, or unspecified chronic kidney disease: Secondary | ICD-10-CM | POA: Diagnosis not present

## 2024-08-26 DIAGNOSIS — Z6835 Body mass index (BMI) 35.0-35.9, adult: Secondary | ICD-10-CM | POA: Diagnosis not present

## 2024-08-26 DIAGNOSIS — N189 Chronic kidney disease, unspecified: Secondary | ICD-10-CM | POA: Diagnosis not present

## 2024-08-26 DIAGNOSIS — E1122 Type 2 diabetes mellitus with diabetic chronic kidney disease: Secondary | ICD-10-CM | POA: Diagnosis not present

## 2024-08-29 DIAGNOSIS — D559 Anemia due to enzyme disorder, unspecified: Secondary | ICD-10-CM | POA: Diagnosis not present

## 2024-08-29 DIAGNOSIS — Z6835 Body mass index (BMI) 35.0-35.9, adult: Secondary | ICD-10-CM | POA: Diagnosis not present

## 2024-08-29 DIAGNOSIS — E871 Hypo-osmolality and hyponatremia: Secondary | ICD-10-CM | POA: Diagnosis not present

## 2024-08-29 DIAGNOSIS — E662 Morbid (severe) obesity with alveolar hypoventilation: Secondary | ICD-10-CM | POA: Diagnosis not present

## 2024-08-29 DIAGNOSIS — G9341 Metabolic encephalopathy: Secondary | ICD-10-CM | POA: Diagnosis not present

## 2024-08-29 DIAGNOSIS — E1142 Type 2 diabetes mellitus with diabetic polyneuropathy: Secondary | ICD-10-CM | POA: Diagnosis not present

## 2024-08-29 DIAGNOSIS — I129 Hypertensive chronic kidney disease with stage 1 through stage 4 chronic kidney disease, or unspecified chronic kidney disease: Secondary | ICD-10-CM | POA: Diagnosis not present

## 2024-08-29 DIAGNOSIS — L03115 Cellulitis of right lower limb: Secondary | ICD-10-CM | POA: Diagnosis not present

## 2024-08-29 DIAGNOSIS — E1122 Type 2 diabetes mellitus with diabetic chronic kidney disease: Secondary | ICD-10-CM | POA: Diagnosis not present

## 2024-08-29 DIAGNOSIS — N189 Chronic kidney disease, unspecified: Secondary | ICD-10-CM | POA: Diagnosis not present

## 2024-08-29 DIAGNOSIS — E7849 Other hyperlipidemia: Secondary | ICD-10-CM | POA: Diagnosis not present

## 2024-09-01 DIAGNOSIS — E662 Morbid (severe) obesity with alveolar hypoventilation: Secondary | ICD-10-CM | POA: Diagnosis not present

## 2024-09-01 DIAGNOSIS — L03115 Cellulitis of right lower limb: Secondary | ICD-10-CM | POA: Diagnosis not present

## 2024-09-01 DIAGNOSIS — N189 Chronic kidney disease, unspecified: Secondary | ICD-10-CM | POA: Diagnosis not present

## 2024-09-01 DIAGNOSIS — E1122 Type 2 diabetes mellitus with diabetic chronic kidney disease: Secondary | ICD-10-CM | POA: Diagnosis not present

## 2024-09-01 DIAGNOSIS — I129 Hypertensive chronic kidney disease with stage 1 through stage 4 chronic kidney disease, or unspecified chronic kidney disease: Secondary | ICD-10-CM | POA: Diagnosis not present

## 2024-09-01 DIAGNOSIS — Z6835 Body mass index (BMI) 35.0-35.9, adult: Secondary | ICD-10-CM | POA: Diagnosis not present

## 2024-09-05 DIAGNOSIS — E1122 Type 2 diabetes mellitus with diabetic chronic kidney disease: Secondary | ICD-10-CM | POA: Diagnosis not present

## 2024-09-05 DIAGNOSIS — M1711 Unilateral primary osteoarthritis, right knee: Secondary | ICD-10-CM | POA: Diagnosis not present

## 2024-09-05 DIAGNOSIS — Z6835 Body mass index (BMI) 35.0-35.9, adult: Secondary | ICD-10-CM | POA: Diagnosis not present

## 2024-09-05 DIAGNOSIS — D631 Anemia in chronic kidney disease: Secondary | ICD-10-CM | POA: Diagnosis not present

## 2024-09-05 DIAGNOSIS — N189 Chronic kidney disease, unspecified: Secondary | ICD-10-CM | POA: Diagnosis not present

## 2024-09-05 DIAGNOSIS — F321 Major depressive disorder, single episode, moderate: Secondary | ICD-10-CM | POA: Diagnosis not present

## 2024-09-05 DIAGNOSIS — I129 Hypertensive chronic kidney disease with stage 1 through stage 4 chronic kidney disease, or unspecified chronic kidney disease: Secondary | ICD-10-CM | POA: Diagnosis not present

## 2024-09-05 DIAGNOSIS — E662 Morbid (severe) obesity with alveolar hypoventilation: Secondary | ICD-10-CM | POA: Diagnosis not present

## 2024-09-05 DIAGNOSIS — M810 Age-related osteoporosis without current pathological fracture: Secondary | ICD-10-CM | POA: Diagnosis not present

## 2024-09-05 DIAGNOSIS — I951 Orthostatic hypotension: Secondary | ICD-10-CM | POA: Diagnosis not present

## 2024-09-05 DIAGNOSIS — E1169 Type 2 diabetes mellitus with other specified complication: Secondary | ICD-10-CM | POA: Diagnosis not present

## 2024-09-05 DIAGNOSIS — L03115 Cellulitis of right lower limb: Secondary | ICD-10-CM | POA: Diagnosis not present

## 2024-09-06 DIAGNOSIS — E1142 Type 2 diabetes mellitus with diabetic polyneuropathy: Secondary | ICD-10-CM | POA: Diagnosis not present

## 2024-09-06 DIAGNOSIS — I10 Essential (primary) hypertension: Secondary | ICD-10-CM | POA: Diagnosis not present

## 2024-09-06 DIAGNOSIS — E782 Mixed hyperlipidemia: Secondary | ICD-10-CM | POA: Diagnosis not present

## 2024-09-06 DIAGNOSIS — M810 Age-related osteoporosis without current pathological fracture: Secondary | ICD-10-CM | POA: Diagnosis not present

## 2024-09-07 DIAGNOSIS — Z0001 Encounter for general adult medical examination with abnormal findings: Secondary | ICD-10-CM | POA: Diagnosis not present

## 2024-09-07 DIAGNOSIS — D519 Vitamin B12 deficiency anemia, unspecified: Secondary | ICD-10-CM | POA: Diagnosis not present

## 2024-09-07 DIAGNOSIS — Z1331 Encounter for screening for depression: Secondary | ICD-10-CM | POA: Diagnosis not present

## 2024-09-07 DIAGNOSIS — Z1389 Encounter for screening for other disorder: Secondary | ICD-10-CM | POA: Diagnosis not present

## 2024-09-07 DIAGNOSIS — E1122 Type 2 diabetes mellitus with diabetic chronic kidney disease: Secondary | ICD-10-CM | POA: Diagnosis not present

## 2024-09-07 DIAGNOSIS — E1142 Type 2 diabetes mellitus with diabetic polyneuropathy: Secondary | ICD-10-CM | POA: Diagnosis not present

## 2024-09-07 DIAGNOSIS — E039 Hypothyroidism, unspecified: Secondary | ICD-10-CM | POA: Diagnosis not present

## 2024-09-07 DIAGNOSIS — Z23 Encounter for immunization: Secondary | ICD-10-CM | POA: Diagnosis not present

## 2024-09-07 DIAGNOSIS — G622 Polyneuropathy due to other toxic agents: Secondary | ICD-10-CM | POA: Diagnosis not present

## 2024-09-07 DIAGNOSIS — Z6834 Body mass index (BMI) 34.0-34.9, adult: Secondary | ICD-10-CM | POA: Diagnosis not present

## 2024-09-08 DIAGNOSIS — M5416 Radiculopathy, lumbar region: Secondary | ICD-10-CM | POA: Diagnosis not present

## 2024-09-12 DIAGNOSIS — N189 Chronic kidney disease, unspecified: Secondary | ICD-10-CM | POA: Diagnosis not present

## 2024-09-12 DIAGNOSIS — E662 Morbid (severe) obesity with alveolar hypoventilation: Secondary | ICD-10-CM | POA: Diagnosis not present

## 2024-09-12 DIAGNOSIS — I129 Hypertensive chronic kidney disease with stage 1 through stage 4 chronic kidney disease, or unspecified chronic kidney disease: Secondary | ICD-10-CM | POA: Diagnosis not present

## 2024-09-12 DIAGNOSIS — Z6835 Body mass index (BMI) 35.0-35.9, adult: Secondary | ICD-10-CM | POA: Diagnosis not present

## 2024-09-12 DIAGNOSIS — L03115 Cellulitis of right lower limb: Secondary | ICD-10-CM | POA: Diagnosis not present

## 2024-09-12 DIAGNOSIS — E1122 Type 2 diabetes mellitus with diabetic chronic kidney disease: Secondary | ICD-10-CM | POA: Diagnosis not present

## 2024-09-18 DIAGNOSIS — M5416 Radiculopathy, lumbar region: Secondary | ICD-10-CM | POA: Diagnosis not present

## 2024-09-18 DIAGNOSIS — M48061 Spinal stenosis, lumbar region without neurogenic claudication: Secondary | ICD-10-CM | POA: Diagnosis not present

## 2024-09-18 DIAGNOSIS — I129 Hypertensive chronic kidney disease with stage 1 through stage 4 chronic kidney disease, or unspecified chronic kidney disease: Secondary | ICD-10-CM | POA: Diagnosis not present

## 2024-09-18 DIAGNOSIS — E662 Morbid (severe) obesity with alveolar hypoventilation: Secondary | ICD-10-CM | POA: Diagnosis not present

## 2024-09-18 DIAGNOSIS — S90921D Unspecified superficial injury of right foot, subsequent encounter: Secondary | ICD-10-CM | POA: Diagnosis not present

## 2024-09-18 DIAGNOSIS — D631 Anemia in chronic kidney disease: Secondary | ICD-10-CM | POA: Diagnosis not present

## 2024-09-18 DIAGNOSIS — Z556 Problems related to health literacy: Secondary | ICD-10-CM | POA: Diagnosis not present

## 2024-09-18 DIAGNOSIS — Z7985 Long-term (current) use of injectable non-insulin antidiabetic drugs: Secondary | ICD-10-CM | POA: Diagnosis not present

## 2024-09-18 DIAGNOSIS — E039 Hypothyroidism, unspecified: Secondary | ICD-10-CM | POA: Diagnosis not present

## 2024-09-18 DIAGNOSIS — M1711 Unilateral primary osteoarthritis, right knee: Secondary | ICD-10-CM | POA: Diagnosis not present

## 2024-09-18 DIAGNOSIS — K760 Fatty (change of) liver, not elsewhere classified: Secondary | ICD-10-CM | POA: Diagnosis not present

## 2024-09-18 DIAGNOSIS — G609 Hereditary and idiopathic neuropathy, unspecified: Secondary | ICD-10-CM | POA: Diagnosis not present

## 2024-09-18 DIAGNOSIS — Z7984 Long term (current) use of oral hypoglycemic drugs: Secondary | ICD-10-CM | POA: Diagnosis not present

## 2024-09-18 DIAGNOSIS — Z87891 Personal history of nicotine dependence: Secondary | ICD-10-CM | POA: Diagnosis not present

## 2024-09-18 DIAGNOSIS — F419 Anxiety disorder, unspecified: Secondary | ICD-10-CM | POA: Diagnosis not present

## 2024-09-18 DIAGNOSIS — E1122 Type 2 diabetes mellitus with diabetic chronic kidney disease: Secondary | ICD-10-CM | POA: Diagnosis not present

## 2024-09-18 DIAGNOSIS — E1169 Type 2 diabetes mellitus with other specified complication: Secondary | ICD-10-CM | POA: Diagnosis not present

## 2024-09-18 DIAGNOSIS — I951 Orthostatic hypotension: Secondary | ICD-10-CM | POA: Diagnosis not present

## 2024-09-18 DIAGNOSIS — L03115 Cellulitis of right lower limb: Secondary | ICD-10-CM | POA: Diagnosis not present

## 2024-09-18 DIAGNOSIS — Z96652 Presence of left artificial knee joint: Secondary | ICD-10-CM | POA: Diagnosis not present

## 2024-09-18 DIAGNOSIS — N189 Chronic kidney disease, unspecified: Secondary | ICD-10-CM | POA: Diagnosis not present

## 2024-09-18 DIAGNOSIS — Z6835 Body mass index (BMI) 35.0-35.9, adult: Secondary | ICD-10-CM | POA: Diagnosis not present

## 2024-09-18 DIAGNOSIS — M810 Age-related osteoporosis without current pathological fracture: Secondary | ICD-10-CM | POA: Diagnosis not present

## 2024-09-18 DIAGNOSIS — F321 Major depressive disorder, single episode, moderate: Secondary | ICD-10-CM | POA: Diagnosis not present

## 2024-09-18 DIAGNOSIS — Z604 Social exclusion and rejection: Secondary | ICD-10-CM | POA: Diagnosis not present

## 2024-09-20 DIAGNOSIS — L03115 Cellulitis of right lower limb: Secondary | ICD-10-CM | POA: Diagnosis not present

## 2024-09-20 DIAGNOSIS — N189 Chronic kidney disease, unspecified: Secondary | ICD-10-CM | POA: Diagnosis not present

## 2024-09-20 DIAGNOSIS — Z1231 Encounter for screening mammogram for malignant neoplasm of breast: Secondary | ICD-10-CM | POA: Diagnosis not present

## 2024-09-20 DIAGNOSIS — E1122 Type 2 diabetes mellitus with diabetic chronic kidney disease: Secondary | ICD-10-CM | POA: Diagnosis not present

## 2024-09-20 DIAGNOSIS — I129 Hypertensive chronic kidney disease with stage 1 through stage 4 chronic kidney disease, or unspecified chronic kidney disease: Secondary | ICD-10-CM | POA: Diagnosis not present

## 2024-09-20 DIAGNOSIS — E662 Morbid (severe) obesity with alveolar hypoventilation: Secondary | ICD-10-CM | POA: Diagnosis not present

## 2024-09-20 DIAGNOSIS — Z6835 Body mass index (BMI) 35.0-35.9, adult: Secondary | ICD-10-CM | POA: Diagnosis not present

## 2024-09-21 ENCOUNTER — Ambulatory Visit: Admitting: Urology

## 2024-09-21 ENCOUNTER — Encounter: Payer: Self-pay | Admitting: Urology

## 2024-09-21 VITALS — BP 134/71 | HR 102

## 2024-09-21 DIAGNOSIS — R32 Unspecified urinary incontinence: Secondary | ICD-10-CM | POA: Diagnosis not present

## 2024-09-21 LAB — BLADDER SCAN AMB NON-IMAGING: Scan Result: 0

## 2024-09-21 MED ORDER — FESOTERODINE FUMARATE ER 8 MG PO TB24
8.0000 mg | ORAL_TABLET | Freq: Every day | ORAL | 5 refills | Status: AC
Start: 2024-09-21 — End: ?

## 2024-09-21 NOTE — Progress Notes (Signed)
 09/21/2024 3:16 PM   Kathryn Ryan Apr 21, 1941 [id number]  Referring provider: Toribio Jerel MATSU, MD [address] [place],  [place] [address]  No chief complaint on file.   HPI: Ms Kathryn Ryan is a 83yo here for followup for urge urinary incontinence. She uses 5-6 pads per day and 3-4 pads per night. She ws prescribed mirabegron  50mg  last visit which failed to improve her urge incontinence. She was then switched to vesicare  5mg  which also failed to improve her incontinence.    PMH: Past Medical History:  Diagnosis Date   Anxiety    Diabetes (HCC) 08/04/2017   Diabetes mellitus without complication (HCC)    new onset diabetic    GERD (gastroesophageal reflux disease)    H/O hiatal hernia    Hypertension    Hypothyroidism    Lumbar radiculopathy    Neuropathy    non related to DM-not sure why   Neuropathy    non DM related   Spinal stenosis of lumbar region     Surgical History: Past Surgical History:  Procedure Laterality Date   ABDOMINAL HYSTERECTOMY     BACK SURGERY     BREAST SURGERY     bilateral lumpectomies   BUNIONECTOMY     bilateral   CHOLECYSTECTOMY     HARDWARE REMOVAL Left 04/26/2014   Procedure: HARDWARE REMOVAL LEFT ANKLE;  Surgeon: Kathryn Armor, MD;  Location: [facility] OR;  Service: Orthopedics;  Laterality: Left;   I & D EXTREMITY Left 04/26/2014   Procedure: IRRIGATION AND DEBRIDEMENT LEFT ANKLE;  Surgeon: Kathryn Armor, MD;  Location: [facility] OR;  Service: Orthopedics;  Laterality: Left;   IRRIGATION AND DEBRIDEMENT ABSCESS Left 04/26/2014   LEFT OOPHORECTOMY     PERIPHERALLY INSERTED CENTRAL CATHETER INSERTION  04/26/2014   rt upper arm   TONSILLECTOMY     6 yrs   TOTAL KNEE ARTHROPLASTY Left 03/07/2024   Procedure: ARTHROPLASTY, KNEE, TOTAL;  Surgeon: Kathryn Lerner, MD;  Location: [facility];  Service: Orthopedics;  Laterality: Left;    Home Medications:  Allergies as of 09/21/2024       Reactions   Sulfa Antibiotics Itching        Medication List         Accurate as of September 21, 2024  3:16 PM. If you have any questions, ask your nurse or doctor.          atorvastatin  10 MG tablet Commonly known as: LIPITOR Take 10 mg by mouth every morning.   Bydureon BCise 2 MG/0.85ML Auij Generic drug: Exenatide ER Inject 2 mg into the skin every Thursday.   chlorhexidine  4 % external liquid Commonly known as: HIBICLENS  Apply 15 mLs (1 Application total) topically as directed for 30 doses. Use as directed daily for 5 days every other week for 6 weeks.   cholecalciferol 25 MCG (1000 UNIT) tablet Commonly known as: VITAMIN D3 Take 2,000 Units by mouth every morning.   Cranberry 200 MG Caps Take 200 mg by mouth in the morning and at bedtime.   DULoxetine  60 MG capsule Commonly known as: CYMBALTA  Take 60 mg by mouth 2 (two) times daily.   Lantus  SoloStar 100 UNIT/ML Solostar Pen Generic drug: insulin  glargine Inject 120 Units into the skin at bedtime.   levothyroxine  150 MCG tablet Commonly known as: SYNTHROID  Take 150 mcg by mouth daily before breakfast.   Melatonin 12 MG Tabs Take 12 mg by mouth at bedtime.   metFORMIN 500 MG 24 hr tablet Commonly known as:  GLUCOPHAGE-XR Take 500 mg by mouth daily.   methocarbamol  500 MG tablet Commonly known as: ROBAXIN  Take 1 tablet (500 mg total) by mouth every 6 (six) hours as needed for muscle spasms.   nitrofurantoin 100 MG capsule Commonly known as: MACRODANTIN Take 100 mg by mouth 2 (two) times daily.   omeprazole 20 MG capsule Commonly known as: PRILOSEC Take 20 mg by mouth every morning.   ondansetron  4 MG tablet Commonly known as: ZOFRAN  Take 4 mg by mouth every 8 (eight) hours as needed for nausea or vomiting.   ondansetron  4 MG tablet Commonly known as: ZOFRAN  Take 1 tablet (4 mg total) by mouth every 6 (six) hours as needed for nausea.   oxyCODONE  5 MG immediate release tablet Commonly known as: Oxy IR/ROXICODONE  Take 1-2 tablets (5-10 mg total) by mouth every 6 (six)  hours as needed for severe pain (pain score 7-10).   solifenacin  5 MG tablet Commonly known as: VESICARE  Take 1 tablet (5 mg total) by mouth daily.   traMADol  50 MG tablet Commonly known as: ULTRAM  Take 1-2 tablets (50-100 mg total) by mouth every 6 (six) hours as needed for moderate pain (pain score 4-6).   traZODone 100 MG tablet Commonly known as: DESYREL Take 100 mg by mouth at bedtime.        Allergies:  Allergies  Allergen Reactions   Sulfa Antibiotics Itching    Family History: Family History  Problem Relation Age of Onset   Breast cancer Mother    Heart disease Father        PPM    Social History:  reports that she quit smoking about 29 years ago. Her smoking use included cigarettes. She started smoking about 55 years ago. She has a 52.4 pack-year smoking history. She has never used smokeless tobacco. She reports that she does not drink alcohol  and does not use drugs.  ROS: All other review of systems were reviewed and are negative except what is noted above in HPI  Physical Exam: BP 134/71   Pulse (!) 102   Constitutional:  Alert and oriented, No acute distress. HEENT: Kathryn Ryan AT, moist mucus membranes.  Trachea midline, no masses. Cardiovascular: No clubbing, cyanosis, or edema. Respiratory: Normal respiratory effort, no increased work of breathing. GI: Abdomen is soft, nontender, nondistended, no abdominal masses GU: No CVA tenderness.  Lymph: No cervical or inguinal lymphadenopathy. Skin: No rashes, bruises or suspicious lesions. Neurologic: Grossly intact, no focal deficits, moving all 4 extremities. Psychiatric: Normal mood and affect.  Laboratory Data: Lab Results  Component Value Date   WBC 10.6 (H) 03/09/2024   HGB 9.5 (L) 03/09/2024   HCT 31.0 (L) 03/09/2024   MCV 88.8 03/09/2024   PLT 152 03/09/2024    Lab Results  Component Value Date   CREATININE 0.84 03/08/2024    No results found for: PSA  No results found for:  TESTOSTERONE   Lab Results  Component Value Date   HGBA1C 6.3 (H) 02/26/2024    Urinalysis    Component Value Date/Time   COLORURINE YELLOW 08/08/2023 1628   APPEARANCEUR CLEAR 08/08/2023 1628   LABSPEC 1.009 08/08/2023 1628   PHURINE 6.0 08/08/2023 1628   GLUCOSEU NEGATIVE 08/08/2023 1628   HGBUR NEGATIVE 08/08/2023 1628   BILIRUBINUR NEGATIVE 08/08/2023 1628   KETONESUR NEGATIVE 08/08/2023 1628   PROTEINUR NEGATIVE 08/08/2023 1628   UROBILINOGEN 1.0 04/25/2014 1255   NITRITE POSITIVE (A) 08/08/2023 1628   LEUKOCYTESUR SMALL (A) 08/08/2023 1628    Lab Results  Component Value Date   BACTERIA RARE (A) 08/08/2023    Pertinent Imaging:  No results found for this or any previous visit.  No results found for this or any previous visit.  No results found for this or any previous visit.  No results found for this or any previous visit.  No results found for this or any previous visit.  No results found for this or any previous visit.  No results found for this or any previous visit.  No results found for this or any previous visit.   Assessment & Plan:    1. Urinary incontinence, unspecified type (Primary) We will trial toviaz 8mg  daily - Urinalysis, Routine w reflex microscopic - BLADDER SCAN AMB NON-IMAGING   No follow-ups on file.  Belvie Clara, MD  [facility] [place]

## 2024-09-21 NOTE — Progress Notes (Signed)
   Patient cannot void prior to the bladder scan. Bladder scan result: 0  Performed By: [facility] LPN

## 2024-09-21 NOTE — Patient Instructions (Signed)

## 2024-09-22 DIAGNOSIS — M5416 Radiculopathy, lumbar region: Secondary | ICD-10-CM | POA: Diagnosis not present

## 2024-09-23 ENCOUNTER — Telehealth: Payer: Self-pay

## 2024-09-23 NOTE — Telephone Encounter (Signed)
 Medication prior authorization request received.  Completed PA request through cover my meds for drug Festerodine. KEY: [id number]  Approved: Pending

## 2024-09-26 DIAGNOSIS — L03115 Cellulitis of right lower limb: Secondary | ICD-10-CM | POA: Diagnosis not present

## 2024-09-26 DIAGNOSIS — N189 Chronic kidney disease, unspecified: Secondary | ICD-10-CM | POA: Diagnosis not present

## 2024-09-26 DIAGNOSIS — E1122 Type 2 diabetes mellitus with diabetic chronic kidney disease: Secondary | ICD-10-CM | POA: Diagnosis not present

## 2024-09-26 DIAGNOSIS — Z6835 Body mass index (BMI) 35.0-35.9, adult: Secondary | ICD-10-CM | POA: Diagnosis not present

## 2024-09-26 DIAGNOSIS — E662 Morbid (severe) obesity with alveolar hypoventilation: Secondary | ICD-10-CM | POA: Diagnosis not present

## 2024-09-26 DIAGNOSIS — I129 Hypertensive chronic kidney disease with stage 1 through stage 4 chronic kidney disease, or unspecified chronic kidney disease: Secondary | ICD-10-CM | POA: Diagnosis not present

## 2024-09-28 DIAGNOSIS — M51369 Other intervertebral disc degeneration, lumbar region without mention of lumbar back pain or lower extremity pain: Secondary | ICD-10-CM | POA: Diagnosis not present

## 2024-09-28 DIAGNOSIS — Z981 Arthrodesis status: Secondary | ICD-10-CM | POA: Diagnosis not present

## 2024-09-28 DIAGNOSIS — M545 Low back pain, unspecified: Secondary | ICD-10-CM | POA: Diagnosis not present

## 2024-09-28 DIAGNOSIS — M961 Postlaminectomy syndrome, not elsewhere classified: Secondary | ICD-10-CM | POA: Diagnosis not present

## 2024-09-30 DIAGNOSIS — E1142 Type 2 diabetes mellitus with diabetic polyneuropathy: Secondary | ICD-10-CM | POA: Diagnosis not present

## 2024-09-30 DIAGNOSIS — E039 Hypothyroidism, unspecified: Secondary | ICD-10-CM | POA: Diagnosis not present

## 2024-09-30 DIAGNOSIS — Z96652 Presence of left artificial knee joint: Secondary | ICD-10-CM | POA: Diagnosis not present

## 2024-09-30 DIAGNOSIS — R32 Unspecified urinary incontinence: Secondary | ICD-10-CM | POA: Diagnosis not present

## 2024-09-30 DIAGNOSIS — J309 Allergic rhinitis, unspecified: Secondary | ICD-10-CM | POA: Diagnosis not present

## 2024-09-30 DIAGNOSIS — E559 Vitamin D deficiency, unspecified: Secondary | ICD-10-CM | POA: Diagnosis not present

## 2024-09-30 DIAGNOSIS — K219 Gastro-esophageal reflux disease without esophagitis: Secondary | ICD-10-CM | POA: Diagnosis not present

## 2024-09-30 DIAGNOSIS — E782 Mixed hyperlipidemia: Secondary | ICD-10-CM | POA: Diagnosis not present

## 2024-09-30 DIAGNOSIS — F5104 Psychophysiologic insomnia: Secondary | ICD-10-CM | POA: Diagnosis not present

## 2024-09-30 DIAGNOSIS — I10 Essential (primary) hypertension: Secondary | ICD-10-CM | POA: Diagnosis not present

## 2024-09-30 DIAGNOSIS — M51369 Other intervertebral disc degeneration, lumbar region without mention of lumbar back pain or lower extremity pain: Secondary | ICD-10-CM | POA: Diagnosis not present

## 2024-10-03 DIAGNOSIS — I129 Hypertensive chronic kidney disease with stage 1 through stage 4 chronic kidney disease, or unspecified chronic kidney disease: Secondary | ICD-10-CM | POA: Diagnosis not present

## 2024-10-03 DIAGNOSIS — N189 Chronic kidney disease, unspecified: Secondary | ICD-10-CM | POA: Diagnosis not present

## 2024-10-03 DIAGNOSIS — E662 Morbid (severe) obesity with alveolar hypoventilation: Secondary | ICD-10-CM | POA: Diagnosis not present

## 2024-10-03 DIAGNOSIS — L03115 Cellulitis of right lower limb: Secondary | ICD-10-CM | POA: Diagnosis not present

## 2024-10-03 DIAGNOSIS — E1122 Type 2 diabetes mellitus with diabetic chronic kidney disease: Secondary | ICD-10-CM | POA: Diagnosis not present

## 2024-10-03 DIAGNOSIS — Z6835 Body mass index (BMI) 35.0-35.9, adult: Secondary | ICD-10-CM | POA: Diagnosis not present

## 2024-10-07 DIAGNOSIS — R32 Unspecified urinary incontinence: Secondary | ICD-10-CM | POA: Diagnosis not present

## 2024-10-07 DIAGNOSIS — I10 Essential (primary) hypertension: Secondary | ICD-10-CM | POA: Diagnosis not present

## 2024-10-07 DIAGNOSIS — E782 Mixed hyperlipidemia: Secondary | ICD-10-CM | POA: Diagnosis not present

## 2024-10-07 DIAGNOSIS — E1142 Type 2 diabetes mellitus with diabetic polyneuropathy: Secondary | ICD-10-CM | POA: Diagnosis not present

## 2024-10-10 DIAGNOSIS — M961 Postlaminectomy syndrome, not elsewhere classified: Secondary | ICD-10-CM | POA: Diagnosis not present

## 2024-10-10 DIAGNOSIS — M546 Pain in thoracic spine: Secondary | ICD-10-CM | POA: Diagnosis not present

## 2024-10-10 DIAGNOSIS — M545 Low back pain, unspecified: Secondary | ICD-10-CM | POA: Diagnosis not present

## 2024-10-12 DIAGNOSIS — E662 Morbid (severe) obesity with alveolar hypoventilation: Secondary | ICD-10-CM | POA: Diagnosis not present

## 2024-10-12 DIAGNOSIS — Z6835 Body mass index (BMI) 35.0-35.9, adult: Secondary | ICD-10-CM | POA: Diagnosis not present

## 2024-10-12 DIAGNOSIS — N189 Chronic kidney disease, unspecified: Secondary | ICD-10-CM | POA: Diagnosis not present

## 2024-10-12 DIAGNOSIS — I129 Hypertensive chronic kidney disease with stage 1 through stage 4 chronic kidney disease, or unspecified chronic kidney disease: Secondary | ICD-10-CM | POA: Diagnosis not present

## 2024-10-12 DIAGNOSIS — L03115 Cellulitis of right lower limb: Secondary | ICD-10-CM | POA: Diagnosis not present

## 2024-10-12 DIAGNOSIS — E1122 Type 2 diabetes mellitus with diabetic chronic kidney disease: Secondary | ICD-10-CM | POA: Diagnosis not present

## 2024-10-17 DIAGNOSIS — E1122 Type 2 diabetes mellitus with diabetic chronic kidney disease: Secondary | ICD-10-CM | POA: Diagnosis not present

## 2024-10-17 DIAGNOSIS — Z6835 Body mass index (BMI) 35.0-35.9, adult: Secondary | ICD-10-CM | POA: Diagnosis not present

## 2024-10-17 DIAGNOSIS — I129 Hypertensive chronic kidney disease with stage 1 through stage 4 chronic kidney disease, or unspecified chronic kidney disease: Secondary | ICD-10-CM | POA: Diagnosis not present

## 2024-10-17 DIAGNOSIS — N189 Chronic kidney disease, unspecified: Secondary | ICD-10-CM | POA: Diagnosis not present

## 2024-10-17 DIAGNOSIS — L03115 Cellulitis of right lower limb: Secondary | ICD-10-CM | POA: Diagnosis not present

## 2024-10-17 DIAGNOSIS — E662 Morbid (severe) obesity with alveolar hypoventilation: Secondary | ICD-10-CM | POA: Diagnosis not present

## 2024-10-18 DIAGNOSIS — I951 Orthostatic hypotension: Secondary | ICD-10-CM | POA: Diagnosis not present

## 2024-10-18 DIAGNOSIS — M5416 Radiculopathy, lumbar region: Secondary | ICD-10-CM | POA: Diagnosis not present

## 2024-10-18 DIAGNOSIS — Z96652 Presence of left artificial knee joint: Secondary | ICD-10-CM | POA: Diagnosis not present

## 2024-10-18 DIAGNOSIS — Z556 Problems related to health literacy: Secondary | ICD-10-CM | POA: Diagnosis not present

## 2024-10-18 DIAGNOSIS — E662 Morbid (severe) obesity with alveolar hypoventilation: Secondary | ICD-10-CM | POA: Diagnosis not present

## 2024-10-18 DIAGNOSIS — S90921D Unspecified superficial injury of right foot, subsequent encounter: Secondary | ICD-10-CM | POA: Diagnosis not present

## 2024-10-18 DIAGNOSIS — F419 Anxiety disorder, unspecified: Secondary | ICD-10-CM | POA: Diagnosis not present

## 2024-10-18 DIAGNOSIS — F321 Major depressive disorder, single episode, moderate: Secondary | ICD-10-CM | POA: Diagnosis not present

## 2024-10-18 DIAGNOSIS — M1711 Unilateral primary osteoarthritis, right knee: Secondary | ICD-10-CM | POA: Diagnosis not present

## 2024-10-18 DIAGNOSIS — N189 Chronic kidney disease, unspecified: Secondary | ICD-10-CM | POA: Diagnosis not present

## 2024-10-18 DIAGNOSIS — Z6835 Body mass index (BMI) 35.0-35.9, adult: Secondary | ICD-10-CM | POA: Diagnosis not present

## 2024-10-18 DIAGNOSIS — D631 Anemia in chronic kidney disease: Secondary | ICD-10-CM | POA: Diagnosis not present

## 2024-10-18 DIAGNOSIS — M810 Age-related osteoporosis without current pathological fracture: Secondary | ICD-10-CM | POA: Diagnosis not present

## 2024-10-18 DIAGNOSIS — Z7984 Long term (current) use of oral hypoglycemic drugs: Secondary | ICD-10-CM | POA: Diagnosis not present

## 2024-10-18 DIAGNOSIS — K760 Fatty (change of) liver, not elsewhere classified: Secondary | ICD-10-CM | POA: Diagnosis not present

## 2024-10-18 DIAGNOSIS — Z7985 Long-term (current) use of injectable non-insulin antidiabetic drugs: Secondary | ICD-10-CM | POA: Diagnosis not present

## 2024-10-18 DIAGNOSIS — L03115 Cellulitis of right lower limb: Secondary | ICD-10-CM | POA: Diagnosis not present

## 2024-10-18 DIAGNOSIS — Z87891 Personal history of nicotine dependence: Secondary | ICD-10-CM | POA: Diagnosis not present

## 2024-10-18 DIAGNOSIS — E1122 Type 2 diabetes mellitus with diabetic chronic kidney disease: Secondary | ICD-10-CM | POA: Diagnosis not present

## 2024-10-18 DIAGNOSIS — Z604 Social exclusion and rejection: Secondary | ICD-10-CM | POA: Diagnosis not present

## 2024-10-18 DIAGNOSIS — M48061 Spinal stenosis, lumbar region without neurogenic claudication: Secondary | ICD-10-CM | POA: Diagnosis not present

## 2024-10-18 DIAGNOSIS — I129 Hypertensive chronic kidney disease with stage 1 through stage 4 chronic kidney disease, or unspecified chronic kidney disease: Secondary | ICD-10-CM | POA: Diagnosis not present

## 2024-10-18 DIAGNOSIS — E039 Hypothyroidism, unspecified: Secondary | ICD-10-CM | POA: Diagnosis not present

## 2024-10-24 DIAGNOSIS — E662 Morbid (severe) obesity with alveolar hypoventilation: Secondary | ICD-10-CM | POA: Diagnosis not present

## 2024-10-24 DIAGNOSIS — E1122 Type 2 diabetes mellitus with diabetic chronic kidney disease: Secondary | ICD-10-CM | POA: Diagnosis not present

## 2024-10-24 DIAGNOSIS — N189 Chronic kidney disease, unspecified: Secondary | ICD-10-CM | POA: Diagnosis not present

## 2024-10-24 DIAGNOSIS — L03115 Cellulitis of right lower limb: Secondary | ICD-10-CM | POA: Diagnosis not present

## 2024-10-24 DIAGNOSIS — S90921D Unspecified superficial injury of right foot, subsequent encounter: Secondary | ICD-10-CM | POA: Diagnosis not present

## 2024-10-24 DIAGNOSIS — I129 Hypertensive chronic kidney disease with stage 1 through stage 4 chronic kidney disease, or unspecified chronic kidney disease: Secondary | ICD-10-CM | POA: Diagnosis not present

## 2024-10-31 DIAGNOSIS — E662 Morbid (severe) obesity with alveolar hypoventilation: Secondary | ICD-10-CM | POA: Diagnosis not present

## 2024-10-31 DIAGNOSIS — S90921D Unspecified superficial injury of right foot, subsequent encounter: Secondary | ICD-10-CM | POA: Diagnosis not present

## 2024-10-31 DIAGNOSIS — I129 Hypertensive chronic kidney disease with stage 1 through stage 4 chronic kidney disease, or unspecified chronic kidney disease: Secondary | ICD-10-CM | POA: Diagnosis not present

## 2024-10-31 DIAGNOSIS — N189 Chronic kidney disease, unspecified: Secondary | ICD-10-CM | POA: Diagnosis not present

## 2024-10-31 DIAGNOSIS — L03115 Cellulitis of right lower limb: Secondary | ICD-10-CM | POA: Diagnosis not present

## 2024-10-31 DIAGNOSIS — E1122 Type 2 diabetes mellitus with diabetic chronic kidney disease: Secondary | ICD-10-CM | POA: Diagnosis not present

## 2024-11-05 DIAGNOSIS — M546 Pain in thoracic spine: Secondary | ICD-10-CM | POA: Diagnosis not present

## 2024-11-06 DIAGNOSIS — I10 Essential (primary) hypertension: Secondary | ICD-10-CM | POA: Diagnosis not present

## 2024-11-06 DIAGNOSIS — E782 Mixed hyperlipidemia: Secondary | ICD-10-CM | POA: Diagnosis not present

## 2024-11-06 DIAGNOSIS — R32 Unspecified urinary incontinence: Secondary | ICD-10-CM | POA: Diagnosis not present

## 2024-11-06 DIAGNOSIS — E1142 Type 2 diabetes mellitus with diabetic polyneuropathy: Secondary | ICD-10-CM | POA: Diagnosis not present

## 2024-11-07 DIAGNOSIS — L03115 Cellulitis of right lower limb: Secondary | ICD-10-CM | POA: Diagnosis not present

## 2024-11-07 DIAGNOSIS — I129 Hypertensive chronic kidney disease with stage 1 through stage 4 chronic kidney disease, or unspecified chronic kidney disease: Secondary | ICD-10-CM | POA: Diagnosis not present

## 2024-11-07 DIAGNOSIS — N189 Chronic kidney disease, unspecified: Secondary | ICD-10-CM | POA: Diagnosis not present

## 2024-11-07 DIAGNOSIS — S90921D Unspecified superficial injury of right foot, subsequent encounter: Secondary | ICD-10-CM | POA: Diagnosis not present

## 2024-11-07 DIAGNOSIS — E662 Morbid (severe) obesity with alveolar hypoventilation: Secondary | ICD-10-CM | POA: Diagnosis not present

## 2024-11-07 DIAGNOSIS — E1122 Type 2 diabetes mellitus with diabetic chronic kidney disease: Secondary | ICD-10-CM | POA: Diagnosis not present

## 2024-11-11 DIAGNOSIS — M545 Low back pain, unspecified: Secondary | ICD-10-CM | POA: Diagnosis not present

## 2024-11-11 DIAGNOSIS — M961 Postlaminectomy syndrome, not elsewhere classified: Secondary | ICD-10-CM | POA: Diagnosis not present

## 2024-11-11 DIAGNOSIS — M546 Pain in thoracic spine: Secondary | ICD-10-CM | POA: Diagnosis not present

## 2025-01-25 ENCOUNTER — Ambulatory Visit: Admitting: Urology
